# Patient Record
Sex: Female | Born: 1955 | Race: White | Hispanic: No | Marital: Married | Smoking: Former smoker
Health system: Southern US, Community
[De-identification: ages and names within clinical notes are randomized; demographics above are authoritative.]

## PROBLEM LIST (undated history)

## (undated) DIAGNOSIS — F4310 Post-traumatic stress disorder, unspecified: Secondary | ICD-10-CM

## (undated) DIAGNOSIS — T7840XA Allergy, unspecified, initial encounter: Secondary | ICD-10-CM

## (undated) DIAGNOSIS — M810 Age-related osteoporosis without current pathological fracture: Secondary | ICD-10-CM

## (undated) DIAGNOSIS — E785 Hyperlipidemia, unspecified: Secondary | ICD-10-CM

## (undated) DIAGNOSIS — M1990 Unspecified osteoarthritis, unspecified site: Secondary | ICD-10-CM

## (undated) DIAGNOSIS — G2581 Restless legs syndrome: Secondary | ICD-10-CM

## (undated) DIAGNOSIS — G8929 Other chronic pain: Secondary | ICD-10-CM

## (undated) DIAGNOSIS — I639 Cerebral infarction, unspecified: Secondary | ICD-10-CM

## (undated) DIAGNOSIS — R7303 Prediabetes: Secondary | ICD-10-CM

## (undated) DIAGNOSIS — R29898 Other symptoms and signs involving the musculoskeletal system: Secondary | ICD-10-CM

## (undated) DIAGNOSIS — R011 Cardiac murmur, unspecified: Secondary | ICD-10-CM

## (undated) DIAGNOSIS — R42 Dizziness and giddiness: Secondary | ICD-10-CM

## (undated) DIAGNOSIS — I10 Essential (primary) hypertension: Secondary | ICD-10-CM

## (undated) DIAGNOSIS — D351 Benign neoplasm of parathyroid gland: Secondary | ICD-10-CM

## (undated) DIAGNOSIS — J45909 Unspecified asthma, uncomplicated: Secondary | ICD-10-CM

## (undated) DIAGNOSIS — M549 Dorsalgia, unspecified: Secondary | ICD-10-CM

## (undated) DIAGNOSIS — Z8709 Personal history of other diseases of the respiratory system: Secondary | ICD-10-CM

## (undated) DIAGNOSIS — R2689 Other abnormalities of gait and mobility: Secondary | ICD-10-CM

## (undated) DIAGNOSIS — M792 Neuralgia and neuritis, unspecified: Secondary | ICD-10-CM

## (undated) DIAGNOSIS — K5909 Other constipation: Secondary | ICD-10-CM

## (undated) DIAGNOSIS — Z87440 Personal history of urinary (tract) infections: Secondary | ICD-10-CM

## (undated) DIAGNOSIS — IMO0001 Reserved for inherently not codable concepts without codable children: Secondary | ICD-10-CM

## (undated) DIAGNOSIS — H8109 Meniere's disease, unspecified ear: Secondary | ICD-10-CM

## (undated) DIAGNOSIS — F329 Major depressive disorder, single episode, unspecified: Secondary | ICD-10-CM

## (undated) DIAGNOSIS — M1711 Unilateral primary osteoarthritis, right knee: Secondary | ICD-10-CM

## (undated) DIAGNOSIS — J302 Other seasonal allergic rhinitis: Secondary | ICD-10-CM

## (undated) DIAGNOSIS — F32A Depression, unspecified: Secondary | ICD-10-CM

## (undated) DIAGNOSIS — E119 Type 2 diabetes mellitus without complications: Secondary | ICD-10-CM

## (undated) HISTORY — DX: Allergy, unspecified, initial encounter: T78.40XA

## (undated) HISTORY — DX: Depression, unspecified: F32.A

## (undated) HISTORY — PX: TUBAL LIGATION: SHX77

## (undated) HISTORY — DX: Restless legs syndrome: G25.81

## (undated) HISTORY — DX: Unspecified asthma, uncomplicated: J45.909

## (undated) HISTORY — DX: Major depressive disorder, single episode, unspecified: F32.9

## (undated) HISTORY — DX: Type 2 diabetes mellitus without complications: E11.9

## (undated) HISTORY — DX: Other chronic pain: G89.29

## (undated) HISTORY — DX: Other chronic pain: M54.9

## (undated) HISTORY — DX: Age-related osteoporosis without current pathological fracture: M81.0

## (undated) HISTORY — PX: BACK SURGERY: SHX140

## (undated) HISTORY — PX: TONSILLECTOMY: SUR1361

## (undated) HISTORY — PX: JOINT REPLACEMENT: SHX530

## (undated) HISTORY — PX: SPINE SURGERY: SHX786

## (undated) HISTORY — DX: Hyperlipidemia, unspecified: E78.5

## (undated) HISTORY — PX: SHOULDER ARTHROSCOPY W/ ROTATOR CUFF REPAIR: SHX2400

---

## 1997-10-09 HISTORY — PX: TUBAL LIGATION: SHX77

## 2005-07-14 ENCOUNTER — Ambulatory Visit: Payer: Self-pay | Admitting: Internal Medicine

## 2005-07-28 ENCOUNTER — Ambulatory Visit (HOSPITAL_COMMUNITY): Admission: RE | Admit: 2005-07-28 | Discharge: 2005-07-28 | Payer: Self-pay | Admitting: Internal Medicine

## 2005-08-04 ENCOUNTER — Ambulatory Visit: Payer: Self-pay | Admitting: Internal Medicine

## 2005-09-06 ENCOUNTER — Ambulatory Visit: Payer: Self-pay | Admitting: Internal Medicine

## 2005-09-28 ENCOUNTER — Ambulatory Visit: Payer: Self-pay | Admitting: Internal Medicine

## 2005-12-16 ENCOUNTER — Emergency Department (HOSPITAL_COMMUNITY): Admission: EM | Admit: 2005-12-16 | Discharge: 2005-12-16 | Payer: Self-pay | Admitting: Emergency Medicine

## 2006-06-08 ENCOUNTER — Ambulatory Visit: Payer: Self-pay | Admitting: Family Medicine

## 2006-06-26 ENCOUNTER — Ambulatory Visit: Payer: Self-pay | Admitting: Family Medicine

## 2006-07-12 ENCOUNTER — Other Ambulatory Visit: Admission: RE | Admit: 2006-07-12 | Discharge: 2006-07-12 | Payer: Self-pay | Admitting: Family Medicine

## 2006-07-12 ENCOUNTER — Ambulatory Visit: Payer: Self-pay | Admitting: Family Medicine

## 2006-07-17 ENCOUNTER — Encounter (INDEPENDENT_AMBULATORY_CARE_PROVIDER_SITE_OTHER): Payer: Self-pay | Admitting: Family Medicine

## 2006-07-17 ENCOUNTER — Ambulatory Visit (HOSPITAL_COMMUNITY): Admission: RE | Admit: 2006-07-17 | Discharge: 2006-07-17 | Payer: Self-pay | Admitting: Family Medicine

## 2006-07-24 ENCOUNTER — Ambulatory Visit: Payer: Self-pay | Admitting: Family Medicine

## 2006-08-08 ENCOUNTER — Ambulatory Visit (HOSPITAL_COMMUNITY): Admission: RE | Admit: 2006-08-08 | Discharge: 2006-08-08 | Payer: Self-pay | Admitting: Family Medicine

## 2006-08-10 ENCOUNTER — Encounter (INDEPENDENT_AMBULATORY_CARE_PROVIDER_SITE_OTHER): Payer: Self-pay | Admitting: Family Medicine

## 2006-08-14 ENCOUNTER — Encounter (INDEPENDENT_AMBULATORY_CARE_PROVIDER_SITE_OTHER): Payer: Self-pay | Admitting: Family Medicine

## 2006-08-21 ENCOUNTER — Ambulatory Visit: Payer: Self-pay | Admitting: Family Medicine

## 2006-09-05 DIAGNOSIS — M129 Arthropathy, unspecified: Secondary | ICD-10-CM | POA: Insufficient documentation

## 2006-09-05 DIAGNOSIS — R42 Dizziness and giddiness: Secondary | ICD-10-CM | POA: Insufficient documentation

## 2006-09-05 DIAGNOSIS — I10 Essential (primary) hypertension: Secondary | ICD-10-CM

## 2006-11-02 ENCOUNTER — Ambulatory Visit: Payer: Self-pay | Admitting: Family Medicine

## 2006-11-27 ENCOUNTER — Emergency Department (HOSPITAL_COMMUNITY): Admission: EM | Admit: 2006-11-27 | Discharge: 2006-11-27 | Payer: Self-pay | Admitting: Emergency Medicine

## 2006-11-30 ENCOUNTER — Ambulatory Visit: Payer: Self-pay | Admitting: Family Medicine

## 2006-11-30 DIAGNOSIS — J45909 Unspecified asthma, uncomplicated: Secondary | ICD-10-CM

## 2006-11-30 LAB — CONVERTED CEMR LAB: Inflenza A Ag: POSITIVE

## 2006-12-03 LAB — CONVERTED CEMR LAB
CO2: 23 meq/L (ref 19–32)
Chloride: 105 meq/L (ref 96–112)
Potassium: 4 meq/L (ref 3.5–5.3)
Sodium: 139 meq/L (ref 135–145)

## 2007-01-11 ENCOUNTER — Ambulatory Visit: Payer: Self-pay | Admitting: Family Medicine

## 2007-01-28 ENCOUNTER — Telehealth (INDEPENDENT_AMBULATORY_CARE_PROVIDER_SITE_OTHER): Payer: Self-pay | Admitting: Family Medicine

## 2007-01-30 ENCOUNTER — Ambulatory Visit: Payer: Self-pay | Admitting: Family Medicine

## 2007-01-31 ENCOUNTER — Telehealth (INDEPENDENT_AMBULATORY_CARE_PROVIDER_SITE_OTHER): Payer: Self-pay | Admitting: Family Medicine

## 2007-01-31 LAB — CONVERTED CEMR LAB
Eosinophils Absolute: 0.1 10*3/uL (ref 0.0–0.7)
HCT: 40.3 % (ref 36.0–46.0)
Hemoglobin: 13.3 g/dL (ref 12.0–15.0)
Lymphs Abs: 1.9 10*3/uL (ref 0.7–3.3)
MCV: 90.8 fL (ref 78.0–100.0)
Monocytes Relative: 5 % (ref 3–11)
Neutrophils Relative %: 59 % (ref 43–77)
RBC: 4.44 M/uL (ref 3.87–5.11)
WBC: 5.5 10*3/uL (ref 4.0–10.5)

## 2007-02-01 ENCOUNTER — Encounter (INDEPENDENT_AMBULATORY_CARE_PROVIDER_SITE_OTHER): Payer: Self-pay | Admitting: Family Medicine

## 2007-02-04 ENCOUNTER — Telehealth (INDEPENDENT_AMBULATORY_CARE_PROVIDER_SITE_OTHER): Payer: Self-pay | Admitting: Family Medicine

## 2007-02-05 ENCOUNTER — Ambulatory Visit: Payer: Self-pay | Admitting: Family Medicine

## 2007-02-13 ENCOUNTER — Ambulatory Visit (HOSPITAL_COMMUNITY): Admission: RE | Admit: 2007-02-13 | Discharge: 2007-02-13 | Payer: Self-pay | Admitting: Family Medicine

## 2007-02-26 ENCOUNTER — Ambulatory Visit: Payer: Self-pay | Admitting: Family Medicine

## 2007-02-26 LAB — CONVERTED CEMR LAB: OCCULT 3: NEGATIVE

## 2007-03-19 ENCOUNTER — Telehealth (INDEPENDENT_AMBULATORY_CARE_PROVIDER_SITE_OTHER): Payer: Self-pay | Admitting: Family Medicine

## 2007-03-19 ENCOUNTER — Encounter (INDEPENDENT_AMBULATORY_CARE_PROVIDER_SITE_OTHER): Payer: Self-pay | Admitting: Family Medicine

## 2007-04-18 ENCOUNTER — Ambulatory Visit: Payer: Self-pay | Admitting: Family Medicine

## 2007-04-18 LAB — CONVERTED CEMR LAB
Cholesterol, target level: 200 mg/dL
LDL Goal: 160 mg/dL

## 2007-04-20 ENCOUNTER — Encounter (INDEPENDENT_AMBULATORY_CARE_PROVIDER_SITE_OTHER): Payer: Self-pay | Admitting: Family Medicine

## 2007-04-21 LAB — CONVERTED CEMR LAB
BUN: 15 mg/dL (ref 6–23)
Creatinine, Ser: 0.77 mg/dL (ref 0.40–1.20)
Lymphocytes Relative: 31 % (ref 12–46)
Lymphs Abs: 2.2 10*3/uL (ref 0.7–3.3)
MCV: 93.2 fL (ref 78.0–100.0)
Monocytes Relative: 4 % (ref 3–11)
Neutro Abs: 4.7 10*3/uL (ref 1.7–7.7)
Neutrophils Relative %: 64 % (ref 43–77)
Potassium: 4.8 meq/L (ref 3.5–5.3)
RBC: 4.56 M/uL (ref 3.87–5.11)
WBC: 7.3 10*3/uL (ref 4.0–10.5)

## 2007-04-22 ENCOUNTER — Telehealth (INDEPENDENT_AMBULATORY_CARE_PROVIDER_SITE_OTHER): Payer: Self-pay | Admitting: *Deleted

## 2007-04-22 ENCOUNTER — Telehealth (INDEPENDENT_AMBULATORY_CARE_PROVIDER_SITE_OTHER): Payer: Self-pay | Admitting: Family Medicine

## 2007-07-24 ENCOUNTER — Ambulatory Visit (HOSPITAL_COMMUNITY): Admission: RE | Admit: 2007-07-24 | Discharge: 2007-07-24 | Payer: Self-pay | Admitting: Family Medicine

## 2007-07-25 ENCOUNTER — Telehealth (INDEPENDENT_AMBULATORY_CARE_PROVIDER_SITE_OTHER): Payer: Self-pay | Admitting: *Deleted

## 2007-07-25 ENCOUNTER — Encounter (INDEPENDENT_AMBULATORY_CARE_PROVIDER_SITE_OTHER): Payer: Self-pay | Admitting: Family Medicine

## 2007-10-17 ENCOUNTER — Ambulatory Visit: Payer: Self-pay | Admitting: Family Medicine

## 2007-10-17 DIAGNOSIS — G4700 Insomnia, unspecified: Secondary | ICD-10-CM | POA: Insufficient documentation

## 2007-10-22 ENCOUNTER — Encounter (INDEPENDENT_AMBULATORY_CARE_PROVIDER_SITE_OTHER): Payer: Self-pay | Admitting: Family Medicine

## 2007-10-22 ENCOUNTER — Encounter (INDEPENDENT_AMBULATORY_CARE_PROVIDER_SITE_OTHER): Payer: Self-pay | Admitting: *Deleted

## 2007-10-22 LAB — CONVERTED CEMR LAB
AST: 9 units/L
Albumin: 3.9 g/dL
Alkaline Phosphatase: 44 units/L
CO2: 25 meq/L
Cholesterol: 234 mg/dL
Creatinine, Ser: 0.78 mg/dL
Glucose, Bld: 85 mg/dL
LDL Cholesterol: 145 mg/dL
Sodium: 141 meq/L
Total Protein: 6.7 g/dL
Triglycerides: 173 mg/dL

## 2007-10-23 ENCOUNTER — Telehealth (INDEPENDENT_AMBULATORY_CARE_PROVIDER_SITE_OTHER): Payer: Self-pay | Admitting: *Deleted

## 2007-10-23 LAB — CONVERTED CEMR LAB
AST: 9 units/L (ref 0–37)
Alkaline Phosphatase: 44 units/L (ref 39–117)
BUN: 18 mg/dL (ref 6–23)
Basophils Relative: 0 % (ref 0–1)
Calcium: 9.6 mg/dL (ref 8.4–10.5)
Creatinine, Ser: 0.78 mg/dL (ref 0.40–1.20)
Eosinophils Absolute: 0.2 10*3/uL (ref 0.0–0.7)
Glucose, Bld: 85 mg/dL (ref 70–99)
HCT: 40.8 % (ref 36.0–46.0)
HDL: 54 mg/dL (ref >39)
Hemoglobin: 13.1 g/dL (ref 12.0–15.0)
MCHC: 32.1 g/dL (ref 30.0–36.0)
MCV: 90.9 fL (ref 78.0–100.0)
Monocytes Absolute: 0.4 10*3/uL (ref 0.1–1.0)
Monocytes Relative: 7 % (ref 3–12)
RBC: 4.49 M/uL (ref 3.87–5.11)
TSH: 1.35 microintl units/mL (ref 0.350–5.50)
Total CHOL/HDL Ratio: 4.3
Triglycerides: 173 mg/dL — ABNORMAL HIGH (ref <150)

## 2007-11-18 ENCOUNTER — Ambulatory Visit: Payer: Self-pay | Admitting: Family Medicine

## 2007-11-18 DIAGNOSIS — E785 Hyperlipidemia, unspecified: Secondary | ICD-10-CM

## 2007-11-18 DIAGNOSIS — E782 Mixed hyperlipidemia: Secondary | ICD-10-CM | POA: Insufficient documentation

## 2007-12-03 ENCOUNTER — Encounter (INDEPENDENT_AMBULATORY_CARE_PROVIDER_SITE_OTHER): Payer: Self-pay | Admitting: Family Medicine

## 2007-12-04 ENCOUNTER — Encounter (INDEPENDENT_AMBULATORY_CARE_PROVIDER_SITE_OTHER): Payer: Self-pay | Admitting: Family Medicine

## 2007-12-16 ENCOUNTER — Ambulatory Visit: Payer: Self-pay | Admitting: Family Medicine

## 2008-01-13 ENCOUNTER — Ambulatory Visit: Payer: Self-pay | Admitting: Family Medicine

## 2008-01-13 LAB — CONVERTED CEMR LAB
Blood in Urine, dipstick: NEGATIVE
Ketones, urine, test strip: NEGATIVE
Protein, U semiquant: NEGATIVE
Specific Gravity, Urine: 1.015
pH: 7

## 2008-01-24 ENCOUNTER — Telehealth (INDEPENDENT_AMBULATORY_CARE_PROVIDER_SITE_OTHER): Payer: Self-pay | Admitting: Family Medicine

## 2008-02-10 ENCOUNTER — Ambulatory Visit: Payer: Self-pay | Admitting: Family Medicine

## 2008-02-17 ENCOUNTER — Ambulatory Visit: Payer: Self-pay | Admitting: Family Medicine

## 2008-03-23 ENCOUNTER — Ambulatory Visit: Payer: Self-pay | Admitting: Family Medicine

## 2008-03-23 DIAGNOSIS — M1990 Unspecified osteoarthritis, unspecified site: Secondary | ICD-10-CM | POA: Insufficient documentation

## 2008-03-24 ENCOUNTER — Encounter (INDEPENDENT_AMBULATORY_CARE_PROVIDER_SITE_OTHER): Payer: Self-pay | Admitting: Family Medicine

## 2008-03-25 ENCOUNTER — Encounter (INDEPENDENT_AMBULATORY_CARE_PROVIDER_SITE_OTHER): Payer: Self-pay | Admitting: Family Medicine

## 2008-04-27 ENCOUNTER — Ambulatory Visit: Payer: Self-pay | Admitting: Family Medicine

## 2008-04-30 ENCOUNTER — Encounter: Payer: Self-pay | Admitting: Orthopedic Surgery

## 2008-05-05 ENCOUNTER — Ambulatory Visit: Payer: Self-pay | Admitting: Orthopedic Surgery

## 2008-05-18 ENCOUNTER — Encounter (INDEPENDENT_AMBULATORY_CARE_PROVIDER_SITE_OTHER): Payer: Self-pay | Admitting: Family Medicine

## 2008-05-25 ENCOUNTER — Ambulatory Visit: Payer: Self-pay | Admitting: Family Medicine

## 2008-05-25 LAB — CONVERTED CEMR LAB: LDL Goal: 130 mg/dL

## 2008-06-09 ENCOUNTER — Encounter (INDEPENDENT_AMBULATORY_CARE_PROVIDER_SITE_OTHER): Payer: Self-pay | Admitting: Family Medicine

## 2008-10-09 HISTORY — PX: SHOULDER ARTHROSCOPY W/ ROTATOR CUFF REPAIR: SHX2400

## 2008-10-21 ENCOUNTER — Ambulatory Visit: Payer: Self-pay | Admitting: Family Medicine

## 2008-10-23 ENCOUNTER — Encounter (INDEPENDENT_AMBULATORY_CARE_PROVIDER_SITE_OTHER): Payer: Self-pay | Admitting: Family Medicine

## 2008-11-19 ENCOUNTER — Ambulatory Visit: Payer: Self-pay | Admitting: Family Medicine

## 2008-12-02 ENCOUNTER — Encounter: Payer: Self-pay | Admitting: Orthopedic Surgery

## 2008-12-02 ENCOUNTER — Encounter (INDEPENDENT_AMBULATORY_CARE_PROVIDER_SITE_OTHER): Payer: Self-pay | Admitting: Family Medicine

## 2008-12-03 ENCOUNTER — Encounter (INDEPENDENT_AMBULATORY_CARE_PROVIDER_SITE_OTHER): Payer: Self-pay | Admitting: Family Medicine

## 2008-12-03 ENCOUNTER — Encounter (INDEPENDENT_AMBULATORY_CARE_PROVIDER_SITE_OTHER): Payer: Self-pay | Admitting: *Deleted

## 2008-12-03 LAB — CONVERTED CEMR LAB
ALT: 8 units/L
Alkaline Phosphatase: 84 units/L
BUN: 14 mg/dL
Cholesterol: 239 mg/dL
Creatinine, Ser: 0.77 mg/dL
HDL: 48 mg/dL
LDL Cholesterol: 163 mg/dL
Platelets: 308 10*3/uL
Total Protein: 6.9 g/dL

## 2008-12-09 LAB — CONVERTED CEMR LAB
Alkaline Phosphatase: 84 units/L (ref 39–117)
BUN: 14 mg/dL (ref 6–23)
CO2: 23 meq/L (ref 19–32)
Cholesterol: 239 mg/dL — ABNORMAL HIGH (ref 0–200)
Creatinine, Ser: 0.77 mg/dL (ref 0.40–1.20)
Eosinophils Absolute: 0.2 10*3/uL (ref 0.0–0.7)
Eosinophils Relative: 4 % (ref 0–5)
Glucose, Bld: 83 mg/dL (ref 70–99)
HCT: 39.3 % (ref 36.0–46.0)
HDL: 48 mg/dL (ref >39)
Hemoglobin: 13 g/dL (ref 12.0–15.0)
LDL Cholesterol: 163 mg/dL — ABNORMAL HIGH (ref 0–99)
Lymphocytes Relative: 37 % (ref 12–46)
Lymphs Abs: 2.2 10*3/uL (ref 0.7–4.0)
MCV: 89.5 fL (ref 78.0–100.0)
Monocytes Absolute: 0.4 10*3/uL (ref 0.1–1.0)
Monocytes Relative: 6 % (ref 3–12)
Platelets: 308 10*3/uL (ref 150–400)
RBC: 4.39 M/uL (ref 3.87–5.11)
Total Bilirubin: 0.4 mg/dL (ref 0.3–1.2)
Triglycerides: 138 mg/dL (ref <150)
VLDL: 28 mg/dL (ref 0–40)
WBC: 6 10*3/uL (ref 4.0–10.5)

## 2008-12-14 ENCOUNTER — Ambulatory Visit: Payer: Self-pay | Admitting: Family Medicine

## 2009-01-22 ENCOUNTER — Telehealth (INDEPENDENT_AMBULATORY_CARE_PROVIDER_SITE_OTHER): Payer: Self-pay | Admitting: *Deleted

## 2009-03-16 ENCOUNTER — Ambulatory Visit: Payer: Self-pay | Admitting: Family Medicine

## 2009-03-16 LAB — CONVERTED CEMR LAB: LDL Goal: 160 mg/dL

## 2009-03-23 ENCOUNTER — Emergency Department (HOSPITAL_COMMUNITY): Admission: EM | Admit: 2009-03-23 | Discharge: 2009-03-23 | Payer: Self-pay | Admitting: Emergency Medicine

## 2009-03-25 ENCOUNTER — Ambulatory Visit: Payer: Self-pay | Admitting: Family Medicine

## 2009-03-25 DIAGNOSIS — R7309 Other abnormal glucose: Secondary | ICD-10-CM | POA: Insufficient documentation

## 2009-03-30 ENCOUNTER — Encounter (INDEPENDENT_AMBULATORY_CARE_PROVIDER_SITE_OTHER): Payer: Self-pay | Admitting: Family Medicine

## 2009-03-30 ENCOUNTER — Ambulatory Visit (HOSPITAL_COMMUNITY): Admission: RE | Admit: 2009-03-30 | Discharge: 2009-03-30 | Payer: Self-pay | Admitting: Family Medicine

## 2009-03-30 ENCOUNTER — Ambulatory Visit: Payer: Self-pay | Admitting: Cardiology

## 2009-04-08 ENCOUNTER — Ambulatory Visit: Payer: Self-pay | Admitting: Family Medicine

## 2009-04-08 DIAGNOSIS — R600 Localized edema: Secondary | ICD-10-CM | POA: Insufficient documentation

## 2009-04-08 DIAGNOSIS — R609 Edema, unspecified: Secondary | ICD-10-CM | POA: Insufficient documentation

## 2009-05-04 ENCOUNTER — Ambulatory Visit: Payer: Self-pay | Admitting: Orthopedic Surgery

## 2009-05-06 ENCOUNTER — Telehealth (INDEPENDENT_AMBULATORY_CARE_PROVIDER_SITE_OTHER): Payer: Self-pay | Admitting: *Deleted

## 2009-05-06 ENCOUNTER — Encounter (INDEPENDENT_AMBULATORY_CARE_PROVIDER_SITE_OTHER): Payer: Self-pay | Admitting: *Deleted

## 2009-05-06 ENCOUNTER — Ambulatory Visit: Payer: Self-pay | Admitting: Family Medicine

## 2009-05-06 LAB — CONVERTED CEMR LAB: OCCULT 1: NEGATIVE

## 2009-05-10 ENCOUNTER — Encounter (INDEPENDENT_AMBULATORY_CARE_PROVIDER_SITE_OTHER): Payer: Self-pay | Admitting: Family Medicine

## 2009-05-11 ENCOUNTER — Encounter (INDEPENDENT_AMBULATORY_CARE_PROVIDER_SITE_OTHER): Payer: Self-pay | Admitting: Family Medicine

## 2009-05-12 ENCOUNTER — Encounter: Payer: Self-pay | Admitting: Orthopedic Surgery

## 2009-05-25 ENCOUNTER — Ambulatory Visit: Payer: Self-pay | Admitting: Orthopedic Surgery

## 2009-06-07 ENCOUNTER — Encounter (INDEPENDENT_AMBULATORY_CARE_PROVIDER_SITE_OTHER): Payer: Self-pay | Admitting: Family Medicine

## 2009-06-08 ENCOUNTER — Ambulatory Visit: Payer: Self-pay | Admitting: Family Medicine

## 2009-06-18 ENCOUNTER — Encounter (INDEPENDENT_AMBULATORY_CARE_PROVIDER_SITE_OTHER): Payer: Self-pay | Admitting: Family Medicine

## 2009-06-23 ENCOUNTER — Ambulatory Visit (HOSPITAL_COMMUNITY): Admission: RE | Admit: 2009-06-23 | Discharge: 2009-06-23 | Payer: Self-pay | Admitting: Family Medicine

## 2009-08-31 ENCOUNTER — Ambulatory Visit: Payer: Self-pay | Admitting: Orthopedic Surgery

## 2009-09-01 ENCOUNTER — Encounter (INDEPENDENT_AMBULATORY_CARE_PROVIDER_SITE_OTHER): Payer: Self-pay | Admitting: *Deleted

## 2009-09-06 ENCOUNTER — Ambulatory Visit (HOSPITAL_COMMUNITY): Admission: RE | Admit: 2009-09-06 | Discharge: 2009-09-06 | Payer: Self-pay | Admitting: Orthopedic Surgery

## 2009-09-13 ENCOUNTER — Ambulatory Visit: Payer: Self-pay | Admitting: Orthopedic Surgery

## 2009-09-20 ENCOUNTER — Encounter: Payer: Self-pay | Admitting: Orthopedic Surgery

## 2009-09-20 ENCOUNTER — Encounter (HOSPITAL_COMMUNITY): Admission: RE | Admit: 2009-09-20 | Discharge: 2009-10-08 | Payer: Self-pay | Admitting: Orthopedic Surgery

## 2009-10-11 ENCOUNTER — Encounter (HOSPITAL_COMMUNITY): Admission: RE | Admit: 2009-10-11 | Discharge: 2009-11-10 | Payer: Self-pay | Admitting: Orthopedic Surgery

## 2009-10-18 ENCOUNTER — Telehealth: Payer: Self-pay | Admitting: Orthopedic Surgery

## 2009-10-27 ENCOUNTER — Ambulatory Visit: Payer: Self-pay | Admitting: Orthopedic Surgery

## 2009-10-27 ENCOUNTER — Telehealth: Payer: Self-pay | Admitting: Orthopedic Surgery

## 2009-10-29 ENCOUNTER — Ambulatory Visit: Payer: Self-pay | Admitting: Orthopedic Surgery

## 2009-10-29 ENCOUNTER — Ambulatory Visit (HOSPITAL_COMMUNITY): Admission: RE | Admit: 2009-10-29 | Discharge: 2009-10-29 | Payer: Self-pay | Admitting: Orthopedic Surgery

## 2009-11-02 ENCOUNTER — Ambulatory Visit: Payer: Self-pay | Admitting: Orthopedic Surgery

## 2009-11-04 ENCOUNTER — Encounter: Payer: Self-pay | Admitting: Orthopedic Surgery

## 2009-11-08 ENCOUNTER — Emergency Department (HOSPITAL_COMMUNITY): Admission: EM | Admit: 2009-11-08 | Discharge: 2009-11-09 | Payer: Self-pay | Admitting: Emergency Medicine

## 2009-11-12 ENCOUNTER — Encounter (HOSPITAL_COMMUNITY): Admission: RE | Admit: 2009-11-12 | Discharge: 2009-12-12 | Payer: Self-pay | Admitting: Orthopedic Surgery

## 2009-11-29 ENCOUNTER — Ambulatory Visit: Payer: Self-pay | Admitting: Family Medicine

## 2009-11-29 DIAGNOSIS — R5381 Other malaise: Secondary | ICD-10-CM | POA: Insufficient documentation

## 2009-11-29 DIAGNOSIS — R5383 Other fatigue: Secondary | ICD-10-CM

## 2009-12-03 ENCOUNTER — Encounter: Payer: Self-pay | Admitting: Orthopedic Surgery

## 2009-12-06 ENCOUNTER — Ambulatory Visit: Payer: Self-pay | Admitting: Orthopedic Surgery

## 2009-12-13 ENCOUNTER — Encounter (HOSPITAL_COMMUNITY): Admission: RE | Admit: 2009-12-13 | Discharge: 2010-01-12 | Payer: Self-pay | Admitting: Orthopedic Surgery

## 2009-12-15 ENCOUNTER — Encounter: Payer: Self-pay | Admitting: Family Medicine

## 2009-12-23 ENCOUNTER — Encounter (INDEPENDENT_AMBULATORY_CARE_PROVIDER_SITE_OTHER): Payer: Self-pay | Admitting: *Deleted

## 2009-12-28 ENCOUNTER — Encounter (INDEPENDENT_AMBULATORY_CARE_PROVIDER_SITE_OTHER): Payer: Self-pay | Admitting: *Deleted

## 2009-12-28 ENCOUNTER — Ambulatory Visit: Payer: Self-pay | Admitting: Cardiology

## 2009-12-31 ENCOUNTER — Encounter: Payer: Self-pay | Admitting: Orthopedic Surgery

## 2010-01-03 ENCOUNTER — Ambulatory Visit: Payer: Self-pay | Admitting: Orthopedic Surgery

## 2010-01-17 ENCOUNTER — Ambulatory Visit: Payer: Self-pay | Admitting: Family Medicine

## 2010-01-17 DIAGNOSIS — H9319 Tinnitus, unspecified ear: Secondary | ICD-10-CM | POA: Insufficient documentation

## 2010-01-17 DIAGNOSIS — H9311 Tinnitus, right ear: Secondary | ICD-10-CM | POA: Insufficient documentation

## 2010-02-01 ENCOUNTER — Encounter (INDEPENDENT_AMBULATORY_CARE_PROVIDER_SITE_OTHER): Payer: Self-pay | Admitting: *Deleted

## 2010-02-23 ENCOUNTER — Telehealth: Payer: Self-pay | Admitting: Family Medicine

## 2010-02-23 ENCOUNTER — Telehealth: Payer: Self-pay | Admitting: Orthopedic Surgery

## 2010-03-17 ENCOUNTER — Encounter (INDEPENDENT_AMBULATORY_CARE_PROVIDER_SITE_OTHER): Payer: Self-pay | Admitting: *Deleted

## 2010-03-20 ENCOUNTER — Emergency Department (HOSPITAL_COMMUNITY): Admission: EM | Admit: 2010-03-20 | Discharge: 2010-03-20 | Payer: Self-pay | Admitting: Emergency Medicine

## 2010-03-30 ENCOUNTER — Other Ambulatory Visit: Admission: RE | Admit: 2010-03-30 | Discharge: 2010-03-30 | Payer: Self-pay | Admitting: Family Medicine

## 2010-03-30 ENCOUNTER — Ambulatory Visit: Payer: Self-pay | Admitting: Family Medicine

## 2010-04-01 ENCOUNTER — Encounter: Payer: Self-pay | Admitting: Family Medicine

## 2010-04-01 LAB — CONVERTED CEMR LAB: Pap Smear: NEGATIVE

## 2010-04-04 ENCOUNTER — Ambulatory Visit: Payer: Self-pay | Admitting: Orthopedic Surgery

## 2010-04-04 DIAGNOSIS — F329 Major depressive disorder, single episode, unspecified: Secondary | ICD-10-CM

## 2010-04-04 DIAGNOSIS — F339 Major depressive disorder, recurrent, unspecified: Secondary | ICD-10-CM | POA: Insufficient documentation

## 2010-05-11 ENCOUNTER — Ambulatory Visit: Payer: Self-pay | Admitting: Physician Assistant

## 2010-05-11 DIAGNOSIS — M5430 Sciatica, unspecified side: Secondary | ICD-10-CM | POA: Insufficient documentation

## 2010-05-19 ENCOUNTER — Telehealth: Payer: Self-pay | Admitting: Family Medicine

## 2010-05-25 ENCOUNTER — Encounter: Payer: Self-pay | Admitting: Family Medicine

## 2010-05-25 ENCOUNTER — Encounter (HOSPITAL_COMMUNITY): Admission: RE | Admit: 2010-05-25 | Discharge: 2010-06-24 | Payer: Self-pay | Admitting: Family Medicine

## 2010-06-28 ENCOUNTER — Ambulatory Visit: Payer: Self-pay | Admitting: Family Medicine

## 2010-06-28 DIAGNOSIS — M5416 Radiculopathy, lumbar region: Secondary | ICD-10-CM | POA: Insufficient documentation

## 2010-06-28 DIAGNOSIS — IMO0002 Reserved for concepts with insufficient information to code with codable children: Secondary | ICD-10-CM

## 2010-06-29 ENCOUNTER — Encounter: Payer: Self-pay | Admitting: Family Medicine

## 2010-08-08 ENCOUNTER — Emergency Department (HOSPITAL_COMMUNITY): Admission: EM | Admit: 2010-08-08 | Discharge: 2010-08-08 | Payer: Self-pay | Admitting: Emergency Medicine

## 2010-08-09 ENCOUNTER — Encounter: Payer: Self-pay | Admitting: Family Medicine

## 2010-08-18 ENCOUNTER — Ambulatory Visit (HOSPITAL_COMMUNITY)
Admission: RE | Admit: 2010-08-18 | Discharge: 2010-08-18 | Payer: Self-pay | Admitting: Physical Medicine and Rehabilitation

## 2010-08-24 ENCOUNTER — Encounter: Payer: Self-pay | Admitting: Family Medicine

## 2010-08-30 ENCOUNTER — Encounter: Payer: Self-pay | Admitting: Family Medicine

## 2010-10-06 ENCOUNTER — Telehealth: Payer: Self-pay | Admitting: Family Medicine

## 2010-10-09 HISTORY — PX: BACK SURGERY: SHX140

## 2010-10-27 ENCOUNTER — Telehealth: Payer: Self-pay | Admitting: Family Medicine

## 2010-10-27 ENCOUNTER — Encounter: Payer: Self-pay | Admitting: Family Medicine

## 2010-10-31 ENCOUNTER — Ambulatory Visit
Admission: RE | Admit: 2010-10-31 | Discharge: 2010-10-31 | Payer: Self-pay | Source: Home / Self Care | Attending: Family Medicine | Admitting: Family Medicine

## 2010-11-02 ENCOUNTER — Encounter: Payer: Self-pay | Admitting: Family Medicine

## 2010-11-08 NOTE — Progress Notes (Signed)
Summary: DR. Kellie Simmering  DR. TRUSLOW   Imported By: Lind Guest 12/22/2009 11:22:50  _____________________________________________________________________  External Attachment:    Type:   Image     Comment:   External Document

## 2010-11-08 NOTE — Assessment & Plan Note (Signed)
Summary: leg pain- room 1   Vital Signs:  Patient profile:   55 year old female Menstrual status:  postmenopausal Height:      62.5 inches Weight:      162 pounds BMI:     29.26 O2 Sat:      98 % on Room air Pulse rate:   86 / minute Resp:     16 per minute BP sitting:   140 / 84  (left arm)  Vitals Entered By: Adella Hare LPN (May 11, 2010 9:43 AM) CC: left upper leg pain, radiates down leg Is Patient Diabetic? No Pain Assessment Patient in pain? yes     Location: left leg Intensity: 3 Type: tightness/cramp Onset of pain  constant/ varies in intensity Comments did not bring meds to ov   Primary Jedrek Dinovo:  Syliva Overman MD  CC:  left upper leg pain and radiates down leg.  History of Present Illness: Pt presents today with c/o Lt LE pain x 4-5 days.  This starts in her Lt buttock and does down to her calf.  Worsens with prolonged sitting or standing.  Has woke her up at bedime.  No trauma. No low back pain.  Is noticing now some numbness and tingling in her toes too.  No loss of bowel or blader control.  Hx of Rt sciatica 22 yrs ago during pregnancy.  She was taking 1 neurontin at bedtime and has increased it to 2 at bedtime the last couple of days, but not noticing any improvement.  Pt has fibromyalgia and OA, but this pain is different.  She takes 400mg  Ibuprofen two times a day.  States the prescription is for 800mg  but this bothers her stomach so she cuts them in half.  Pt states she has a no effect from pain meds.  Vicoden, Percocet, Oxycodone, all have tried and none help with pain in the past.    Allergies (verified): No Known Drug Allergies PMH reviewed for relevance  Review of Systems General:  Denies chills and fever. CV:  Denies chest pain or discomfort. Resp:  Denies shortness of breath. GI:  Denies abdominal pain and change in bowel habits. GU:  Denies dysuria, incontinence, and urinary frequency. MS:  Complains of muscle aches; denies joint pain,  joint swelling, low back pain, and mid back pain. Neuro:  Complains of numbness and tingling.  Physical Exam  General:  Well-developed,well-nourished,in no acute distress; alert,appropriate and cooperative throughout examination Head:  Normocephalic and atraumatic without obvious abnormalities. No apparent alopecia or balding. Lungs:  Normal respiratory effort, chest expands symmetrically. Lungs are clear to auscultation, no crackles or wheezes. Heart:  Normal rate and regular rhythm. S1 and S2 normal without gallop, murmur, click, rub or other extra sounds. Msk:  LS spine:  FROM.  TTP Lt sciatic notch.  Spinous processes, paraspinal muscles and SI joints nontender.  Neg SLR. Pulses:  R posterior tibial normal, R dorsalis pedis normal, L posterior tibial normal, and L dorsalis pedis normal.   Extremities:  No clubbing, cyanosis, edema, or deformity noted with normal full range of motion of all joints.   Neurologic:  alert & oriented X3, strength normal in all extremities, and gait normal.  Bilat patellar and achilles DTR +1/4. Cervical Nodes:  No lymphadenopathy noted Psych:  Cognition and judgment appear intact. Alert and cooperative with normal attention span and concentration. No apparent delusions, illusions, hallucinations   Impression & Recommendations:  Problem # 1:  SCIATICA, LEFT (ICD-724.3) Assessment New  Her updated medication list for this problem includes:    Celebrex 200 Mg Caps (Celecoxib) ..... One daily    Ibuprofen 800 Mg Tabs (Ibuprofen) .Marland Kitchen... Take at bedtime  Problem # 2:  HYPERTENSION (ICD-401.9) Assessment: Comment Only  Her updated medication list for this problem includes:    Lisinopril 20 Mg Tabs (Lisinopril) ..... One daily  BP today: 140/84 Prior BP: 112/80 (03/30/2010)  Prior 10 Yr Risk Heart Disease: 7 % (06/08/2009)  Labs Reviewed: K+: 4.6 (12/03/2008) Creat: : 0.77 (12/03/2008)   Chol: 239 (12/03/2008)   HDL: 48 (12/03/2008)   LDL: 163  (12/03/2008)   TG: 138 (12/03/2008)  Complete Medication List: 1)  Lisinopril 20 Mg Tabs (Lisinopril) .... One daily 2)  Proair Hfa 108 (90 Base) Mcg/act Aers (Albuterol sulfate) .... 2 puffs every 6 hours as needed 3)  Symbicort 160-4.5 Mcg/act Aero (Budesonide-formoterol fumarate) .... One puff two times a day 4)  Minocycline Hcl 100 Mg Caps (Minocycline hcl) .... One once daily 5)  Mirapex 0.25 Mg Tabs (Pramipexole dihydrochloride) .... At bedtime 6)  Left Wrist Carpal Tunnel Brace  .... As directed 7)  Celebrex 200 Mg Caps (Celecoxib) .... One daily 8)  Neurontin 100 Mg Caps (Gabapentin) .Marland Kitchen.. 1 by mouth at bedtime 9)  Zyrtec Allergy 10 Mg Tabs (Cetirizine hcl) .... Take 1 tablet by mouth once a day 10)  Ibuprofen 800 Mg Tabs (Ibuprofen) .... Take at bedtime 11)  Fluticasone Propionate 50 Mcg/act Susp (Fluticasone propionate) .... Use 2 sprays each nostril once daily 12)  Lexapro 20 Mg Tabs (Escitalopram oxalate) .... Take 1 tablet by mouth once a day  Other Orders: Depo- Medrol 80mg  (J1040) Ketorolac-Toradol 15mg  (Z6109) Admin of Therapeutic Inj  intramuscular or subcutaneous (60454)  Patient Instructions: 1)  Keep your next appt with Dr Lodema Hong.  We will see you sooner if needed. 2)  Increase your Ibuprofen dose to 400 mg 4 times daily. 3)  Increase your Neurontin to 100 mg am and 200mg  at bedtime. 4)  You may use Acetominophen every 4 hrs as needed for pain. 5)  You have received Depo Medrol today and Toradol to help with pain and inflammation. 6)  If you are not doing better Monday call the office.  I will refer you to physical therapy.   Medication Administration  Injection # 1:    Medication: Depo- Medrol 80mg     Diagnosis: LEG PAIN, LEFT (ICD-729.5)    Route: IM    Site: RUOQ gluteus    Exp Date: 4/12    Lot #: [id number]    Mfr: Pharmacia    Patient tolerated injection without complications    Given by: Adella Hare LPN (May 11, 2010 11:34 AM)  Injection # 2:     Medication: Ketorolac-Toradol 15mg     Diagnosis: LEG PAIN, LEFT (ICD-729.5)    Route: IM    Site: LUOQ gluteus    Exp Date: 12/08/2011    Lot #: [id number]    Mfr: [email or web address]    Comments: toradol 60mg  given    Patient tolerated injection without complications    Given by: Adella Hare LPN (May 11, 2010 11:35 AM)  Orders Added: 1)  Depo- Medrol 80mg  [J1040] 2)  Ketorolac-Toradol 15mg  [J1885] 3)  Admin of Therapeutic Inj  intramuscular or subcutaneous [96372] 4)  Est. Patient Level IV [47829]

## 2010-11-08 NOTE — Letter (Signed)
Summary: Letter  Letter   Imported By: Lind Guest 04/04/2010 11:32:24  _____________________________________________________________________  External Attachment:    Type:   Image     Comment:   External Document

## 2010-11-08 NOTE — Miscellaneous (Signed)
Summary: labs cmp,lipid,10/22/2007  Clinical Lists Changes  Observations: Added new observation of CALCIUM: 9.6 mg/dL (54/06/8118 14:78) Added new observation of ALBUMIN: 3.9 g/dL (29/56/2130 86:57) Added new observation of PROTEIN, TOT: 6.7 g/dL (84/69/6295 28:41) Added new observation of SGPT (ALT): 10 units/L (10/22/2007 11:16) Added new observation of SGOT (AST): 9 units/L (10/22/2007 11:16) Added new observation of ALK PHOS: 44 units/L (10/22/2007 11:16) Added new observation of CREATININE: 0.78 mg/dL (32/44/0102 72:53) Added new observation of BUN: 18 mg/dL (66/44/0347 42:59) Added new observation of BG RANDOM: 85 mg/dL (56/38/7564 33:29) Added new observation of CO2 PLSM/SER: 25 meq/L (10/22/2007 11:16) Added new observation of CL SERUM: 106 meq/L (10/22/2007 11:16) Added new observation of K SERUM: 4.6 meq/L (10/22/2007 11:16) Added new observation of NA: 141 meq/L (10/22/2007 11:16) Added new observation of LDL: 145 mg/dL (51/88/4166 06:30) Added new observation of HDL: 54 mg/dL (16/10/930 35:57) Added new observation of TRIGLYC TOT: 173 mg/dL (32/20/2542 70:62) Added new observation of CHOLESTEROL: 234 mg/dL (37/62/8315 17:61)

## 2010-11-08 NOTE — Miscellaneous (Signed)
Summary: OT  Clinical evaluation  OT  Clinical evaluation   Imported By: Jacklynn Ganong 11/16/2009 15:51:18  _____________________________________________________________________  External Attachment:    Type:   Image     Comment:   External Document

## 2010-11-08 NOTE — Letter (Signed)
Summary: no show/ rehab department  no show/ rehab department   Imported By: Lind Guest 06/29/2010 11:36:39  _____________________________________________________________________  External Attachment:    Type:   Image     Comment:   External Document

## 2010-11-08 NOTE — Progress Notes (Signed)
Summary: No pre-cert required for out-patient procedure  Phone Note Outgoing Call   Call placed to: Insurer Summary of Call: [employer] re: out-patient surgery scheduled 10/29/09; for cpt 29826, no pre-cert required, per Tyra W. Initial call taken by: Cammie Sickle,  October 27, 2009 5:08 PM

## 2010-11-08 NOTE — Assessment & Plan Note (Signed)
Summary: 3 MO RECK/XR RT SHULDER/POST OP/BSBC/BSF   Visit Type:  Follow-up Primary Provider:  Syliva Overman MD  CC:  right shoulder .  History of Present Illness: 55 year old female 5 months status post arthroscopy RIGHT shoulder with subacromial decompression  The patient states her shoulder is doing well.  She is currently on ibuprofen 800 mg and Neurontin 300 mg, the Neurontin is for fibromyalgia  date of surgery October 30, 1999  Allergies: No Known Drug Allergies  Physical Exam  Additional Exam:  the patient looks well  She has good pulse in her RIGHT upper extremity no lymphadenopathy normal skin, sensation is normal she is awake and alert  Her shoulder looks good there is no tenderness or swelling her range of motion is excellent strength is excellent as well compared to the LEFT.  Shoulder is stable     Impression & Recommendations:  Problem # 1:  IMPINGEMENT SYNDROME (ICD-726.2) Assessment Improved  Orders: Est. Patient Level II ([address])  Patient Instructions: 1)  Please schedule a follow-up appointment as needed.

## 2010-11-08 NOTE — Letter (Signed)
Summary: Stress Echocardiogram Information Sheet  [facility]  [address], [place] [address]   Phone: [phone]  Fax: [phone]      December 28, 2009 MRN: [id number] light prior to the test.   Teresa Soto  Doctor: Appointment Date: Appointment Time: Appointment Location: [facility]  Stress Echocardiogram Information Sheet    Instructions:   1.    TAKE ALL YOUR MEDS WITH A SIP OF WATER THE MORNING OF YOUR TEST 2. NOTHING TO EAT OR DRINK PRIOR TO YOUR TEST.  3. Dress prepared to exercise.  4. DO NOT use ANY caffine or tobacco products 3 hours before appointment.  5. Report to the Short Stay Center on the1st floor.  6. Please bring all current prescription medications.  7. If you have any questions, please call [phone]

## 2010-11-08 NOTE — Assessment & Plan Note (Signed)
Summary: ears humming- room 2   Vital Signs:  Patient profile:   55 year old female Menstrual status:  postmenopausal Height:      62.5 inches Weight:      161.25 pounds BMI:     29.13 O2 Sat:      98 % on Room air Pulse rate:   86 / minute Resp:     16 per minute BP sitting:   100 / 70  (left arm)  Vitals Entered By: Adella Hare LPN (January 17, 2010 2:29 PM) CC: ears humming, causes dizziness at times Is Patient Diabetic? No Pain Assessment Patient in pain? no        Primary Provider:  Syliva Overman MD  CC:  ears humming and causes dizziness at times.  History of Present Illness: Pt is here today with humming in her Rt ear x 3 days.  States yesterday when swallowed her Rt ear would click, then the humming would go away for awhile.  Not happening when swallows today.  Hx of vertigo.  States she is having some problems with dizziness off & on.  No headache, or weakness.  Hx of allergic rhinitis.  Uses Zyrtec daily.  No increase in congestion, sneezing, etc.  Usually the fall is her worse season, though she has nasal congestion yr round.   Current Medications (verified): 1)  Lisinopril 20 Mg  Tabs (Lisinopril) .... One Daily 2)  Proair Hfa 108 (90 Base) Mcg/act  Aers (Albuterol Sulfate) .... 2 Puffs Every 6 Hours As Needed 3)  Symbicort 160-4.5 Mcg/act  Aero (Budesonide-Formoterol Fumarate) .... One Puff Two Times A Day 4)  Minocycline Hcl 100 Mg  Caps (Minocycline Hcl) .... One Once Daily 5)  Mirapex 0.25 Mg Tabs (Pramipexole Dihydrochloride) .... At Bedtime 6)  Left Wrist Carpal Tunnel Brace .... As Directed 7)  Celebrex 200 Mg Caps (Celecoxib) .... One Daily 8)  Lexapro 10 Mg Tabs (Escitalopram Oxalate) .... One Daily 9)  Neurontin 100 Mg Caps (Gabapentin) .Marland Kitchen.. 1 By Mouth At Bedtime 10)  Zyrtec Allergy 10 Mg Tabs (Cetirizine Hcl) .... Take 1 Tablet By Mouth Once A Day 11)  Ibuprofen 800 Mg Tabs (Ibuprofen) .... Take At Bedtime  Allergies (verified): No Known Drug  Allergies  Past History:  Past medical history reviewed for relevance to current acute and chronic problems.  Past Medical History: Reviewed history from 12/28/2009 and no changes required. 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 ENT:  Complains of nasal congestion and ringing in ears; denies ear discharge, earache, postnasal drainage, sinus pressure, and sore throat; RT EAR HUMMING X 3 DAYS. CV:  Denies chest pain or discomfort. Resp:  Denies cough and shortness of breath. Neuro:  Denies headaches, poor balance, tremors, and weakness.  Physical Exam  General:  Well-developed,well-nourished,in no acute distress; alert,appropriate and cooperative throughout examination Head:  Normocephalic and atraumatic without obvious abnormalities. No apparent  alopecia or balding. Ears:  External ear exam shows no significant lesions or deformities.  Otoscopic examination reveals clear canals, tympanic membranes are intact bilaterally without bulging, retraction, inflammation or discharge. Hearing is grossly normal bilaterally. Nose:  no  external deformity, mucosal erythema, and mucosal edema, RT> Lt.  Small amt clear mucus bilat nares Mouth:  Oral mucosa and oropharynx without lesions or exudates.  Teeth in good repair. Neck:  No deformities, masses, or tenderness noted. Lungs:  Normal respiratory effort, chest expands symmetrically. Lungs are clear to auscultation, no crackles or wheezes. Heart:  Normal rate and regular rhythm. S1 and S2 normal without gallop, murmur, click, rub or other extra sounds. Skin:  Intact without suspicious lesions or rashes Cervical Nodes:  No lymphadenopathy noted Psych:  Cognition and judgment appear intact. Alert and cooperative with normal attention span and concentration. 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  Problem # 2:  TINNITUS, RIGHT (ICD-388.30) Assessment: New Secondary to #1 above.  Discusses with pt that this can be due to her ear being plugged.  But this can also be due to other causes, such as hearing loss, or acoustic neuroma.  If it doesn't improve she will need further eval by ENT.  Problem # 3:  ALLERGIC RHINITIS (ICD-477.9) Assessment: Unchanged  Her updated medication list for this problem includes:    Zyrtec Allergy 10 Mg Tabs (Cetirizine hcl) .Marland Kitchen... Take 1 tablet by mouth once a day    Fluticasone Propionate 50 Mcg/act Susp (Fluticasone propionate) ..... Use 2 sprays each nostril once daily  Complete Medication List: 1)  Lisinopril 20 Mg Tabs (Lisinopril) .... One daily 2)  Proair Hfa 108 (90 Base) Mcg/act Aers (Albuterol sulfate) .... 2 puffs every 6 hours as needed 3)  Symbicort 160-4.5 Mcg/act Aero (Budesonide-formoterol fumarate) .... One puff two times a day 4)  Minocycline Hcl 100 Mg Caps (Minocycline hcl) .... One once daily 5)  Mirapex 0.25 Mg Tabs (Pramipexole  dihydrochloride) .... At bedtime 6)  Left Wrist Carpal Tunnel Brace  .... As directed 7)  Celebrex 200 Mg Caps (Celecoxib) .... One daily 8)  Lexapro 10 Mg Tabs (Escitalopram oxalate) .... One daily 9)  Neurontin 100 Mg Caps (Gabapentin) .Marland Kitchen.. 1 by mouth at bedtime 10)  Zyrtec Allergy 10 Mg Tabs (Cetirizine hcl) .... Take 1 tablet by mouth once a day 11)  Ibuprofen 800 Mg Tabs (Ibuprofen) .... Take at bedtime 12)  Fluticasone Propionate 50 Mcg/act Susp (Fluticasone propionate) .... Use 2 sprays each nostril once daily  Patient Instructions: 1)  Please schedule a follow-up appointment as needed. 2)  Continue your current medications. 3)  I have prescribed an allergy nasal spray for you to use once daily. 4)  If your syptoms dont improve in the next 7-10 days please let me know.& I will refer you to an ENT Dr. Prescriptions: FLUTICASONE PROPIONATE 50 MCG/ACT SUSP (FLUTICASONE PROPIONATE) use 2 sprays each nostril once daily  #1 x 2   Entered and Authorized by:   Esperanza Sheets PA   Signed by:   Esperanza Sheets PA on 01/17/2010   Method used:   Electronically to        [facility]  [address] (retail)       [address]       [place]  [place], [place]  [address]       Ph: [phone]       Fax: [phone]   RxID:   [id number]

## 2010-11-08 NOTE — Assessment & Plan Note (Signed)
Summary: NEW PATIENT   Vital Signs:  Patient profile:   55 year old female Menstrual status:  postmenopausal Height:      62.5 inches Weight:      157.25 pounds BMI:     28.41 O2 Sat:      97 % Pulse rate:   106 / minute Pulse rhythm:   regular Resp:     16 per minute BP sitting:   124 / 90  (left arm) Cuff size:   regular  Vitals Entered By: Everitt Amber LPN (November 29, 2009 1:50 PM)  Nutrition Counseling: Patient's BMI is greater than 25 and therefore counseled on weight management options. CC: New patient  Is Patient Diabetic? No     Menstrual Status postmenopausal Last PAP Result normal   Primary Care Provider:  Syliva Overman MD  CC:  New patient .  History of Present Illness: New pt eval uation , main complaint is of chronic, debilaitasting generalised  pain, which is worsening in ther past 18 months. Primarily fingers and toes, toes iniyyally, but includes, low back, non radiating,, hands. Chronic knee pain, from age  68 she was told she prob should have had surgery as baby. She denies any recenrt fever or chills. She denies head or chest congestion. She denies dysuria or frequuency. Preventivescreening is not uTD but she is willing to have this addressed. she repolrts becoming easily irritated, and denies overt depression,but does stte she has anxiety.  Preventive Screening-Counseling & Management  Alcohol-Tobacco     Smoking Status: quit  Current Medications (verified): 1)  Lisinopril 20 Mg  Tabs (Lisinopril) .... One Daily 2)  Proair Hfa 108 (90 Base) Mcg/act  Aers (Albuterol Sulfate) .... 2 Puffs Every 6 Hours As Needed 3)  Symbicort 160-4.5 Mcg/act  Aero (Budesonide-Formoterol Fumarate) .... One Puff Two Times A Day 4)  Minocycline Hcl 100 Mg  Caps (Minocycline Hcl) .... One Once Daily 5)  Mirapex 0.25 Mg Tabs (Pramipexole Dihydrochloride) .... At Bedtime 6)  Left Wrist Carpal Tunnel Brace .... As Directed 7)  Celebrex 200 Mg Caps (Celecoxib) ....  One Daily 8)  Lexapro 10 Mg Tabs (Escitalopram Oxalate) .... One Daily 9)  Neurontin 100 Mg Caps (Gabapentin) .Marland Kitchen.. 1 By Mouth At Bedtime 10)  Zyrtec Allergy 10 Mg Tabs (Cetirizine Hcl) .... Take 1 Tablet By Mouth Once A Day  Allergies (verified): No Known Drug Allergies  Past History:  Family History: Last updated: 11/29/2009 Father: - Does not know him - OA, died   at age 58 Mother: 38 HTN/Lipids Siblings: Sister 57 - W&L, no brothers KIds: 5 x: Girls 38, 28 Boys 81, 64 and 26 - W&L  In 2011  Social History: Last updated: 11/29/2009 Occupation: Technical sales engineer Married  x 24 yrs in 2010  Alcohol use-no Drug use-no education: 12 th grade Lives with husband and daughter Former Smoker quit at 26   Risk Factors: Caffeine Use: 1 (05/05/2008)  Risk Factors: Smoking Status: quit (11/29/2009)  Past medical, surgical, family and social histories (including risk factors) reviewed, and no changes noted (except as noted below).  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) ASTHMA (ICD-493.90) VERTIGO (ICD-780.4) specidifc triggers eg assymetrical ceilings, duration since approx 2000, also has motion sickness, responds to dramamine CARPAL TUNNEL SYNDROME (ICD-354.0) htn  dx in  2006 ARTHRITIS (ICD-716.90) CONSTIPATION (ICD-564.00) HYPERTENSION (ICD-401.9) ALLERGIC RHINITIS (ICD-477.9) Fibromyalgia dx in 2010, excellent response to neurontin prescribed by Dr Romeo Apple Rosacea since 1992, uses minocylcline for flares  Past Surgical History: Caesarean section 1989 1nd 1991 Carpal tunnel release - right - 2003 Jan 21s, 2011t-right  shoulder surgery for impingement  dr Romeo Apple  Family History: Reviewed history from 12/14/2008 and no changes required. Father: - Does not know him - OA, died   at age 44 Mother: 2 HTN/Lipids Siblings: Sister 37 - W&L, no brothers KIds: 5 x: Girls 54, 28 Boys 53, 10 and 51 - W&L  In 2011  Social History: Reviewed history from 05/06/2009 and no changes required. Occupation: Technical sales engineer Married  x 24 yrs in 2010  Alcohol use-no Drug use-no education: 12 th grade Lives with husband and daughter Former Smoker quit at 26  Smoking Status:  quit  Review of Systems      See HPI General:  Denies chills, fever, and malaise. Eyes:  Denies blurring and discharge. CV:  Complains of fatigue and palpitations; denies chest pain or discomfort, difficulty breathing at night, and swelling of feet; intermittent "pounding" of hr heart on avg once every 6 months, as though heart wll come out of her chest,and followed by heart racing for approx 5 mins, no light headedness, but out of breath as though she had been running up stairs, std about 2004. Resp:  Denies cough, shortness of breath, and sputum productive. GI:  Denies abdominal pain, constipation, diarrhea, nausea, and vomiting. GU:  Denies abnormal vaginal bleeding, dysuria, and urinary frequency. MS:  Complains of joint pain, low back pain, mid back pain, and stiffness. Psych:  Complains of anxiety and irritability; denies suicidal thoughts/plans, thoughts of violence, and unusual visions or sounds. Endo:  Denies cold intolerance, excessive hunger, excessive thirst, excessive urination, heat intolerance, polyuria, and weight change. Heme:  Denies abnormal bruising and bleeding. Allergy:  Complains of seasonal allergies; denies hives or rash and itching eyes.  Physical Exam  General:  Well-developed,well-nourished,in no acute distress; alert,appropriate and cooperative throughout examination HEENT: No facial asymmetry,  EOMI, No  sinus tenderness, TM's Clear, oropharynx  pink and moist.   Chest: Clear to auscultation bilaterally.  CVS: S1, S2, No murmurs, No S3.   Abd: Soft, Nontender.  MS: Adequate ROM spine, hips, shoulders and knees.  Ext: No edema.   CNS: CN 2-12 intact, power tone and sensation normal throughout.   Skin: Intact, no visible lesions or rashes.  Psych: Good eye contact, normal affect.  Memory intact,  anxious though not depressed appearing.    Complete Medication List: 1)  Lisinopril 20 Mg Tabs (Lisinopril) .... One daily 2)  Proair Hfa 108 (90 Base) Mcg/act Aers (Albuterol sulfate) .... 2 puffs every 6 hours as needed 3)  Symbicort 160-4.5 Mcg/act Aero (Budesonide-formoterol fumarate) .... One puff two times a day 4)  Minocycline Hcl 100 Mg Caps (Minocycline hcl) .... One once daily 5)  Mirapex 0.25 Mg Tabs (Pramipexole dihydrochloride) .... At bedtime 6)  Left Wrist Carpal Tunnel Brace  .... As directed 7)  Celebrex 200 Mg Caps (Celecoxib) .... One daily 8)  Lexapro 10 Mg Tabs (Escitalopram oxalate) .... One daily 9)  Neurontin 100 Mg Caps (Gabapentin) .Marland Kitchen.. 1 by mouth at bedtime 10)  Zyrtec Allergy 10 Mg Tabs (Cetirizine hcl) .... Take 1 tablet by mouth once a day  Other Orders: T-Basic Metabolic Panel [phone]) T-Lipid Profile [id number]) T-CBC w/Diff [id number]) T-TSH [phone]) Cardiology  Referral (Cardiology) Gastroenterology Referral (GI) Rheumatology Referral (Rheumatology)  Patient Instructions: 1)  CPE ij 2 months. 2)  Your BP is high 124/90, pls cut down on salt, regular physical activity and weight loss will help with this. 3)  BMP prior to visit, ICD-9: 4)  Lipid Panel prior to visit, ICD-9: 5)  TSH prior to visit, ICD-9:   fasting asap   first week in March 6)  CBC w/ Diff prior to visit, ICD-9: 7)  You will be referred to rheumatology, cardiology , colonscopy,

## 2010-11-08 NOTE — Assessment & Plan Note (Signed)
Summary: physical   Vital Signs:  Patient profile:   55 year old female Menstrual status:  postmenopausal Height:      62.5 inches Weight:      161.25 pounds BMI:     29.13 O2 Sat:      98 % Pulse rate:   92 / minute Pulse rhythm:   regular Resp:     16 per minute BP sitting:   112 / 80  (left arm) Cuff size:   regular  Vitals Entered By: Everitt Amber LPN (March 30, 2010 11:04 AM)  Nutrition Counseling: Patient's BMI is greater than 25 and therefore counseled on weight management options. CC: CPE   Vision Screening:Left eye with correction: 20 / 20 Right eye with correction: 20 / 20 Both eyes with correction: 20 / 20  Color vision testing: normal      Vision Entered By: Everitt Amber LPN (March 30, 2010 11:04 AM)   Primary Care Provider:  Syliva Overman MD  CC:  CPE .  History of Present Illness: Reports  that she has been doing fairly well. She has had no recent fever or chills. Denies sinus pressure,  , ear pain or sore throat. Denies chest congestion, or cough productive of sputum. Denies chest pain, palpitations, PND, orthopnea or leg swelling. Denies abdominal pain, nausea, vomitting, diarrhea or constipation. Denies change in bowel movements or bloody stool. Denies dysuria , frequency, incontinence or hesitancy. Denies  joint pain, swelling, or reduced mobility. Denies headaches, vertigo, seizures.  Denies  rash, lesions, or itch.     Current Medications (verified): 1)  Lisinopril 20 Mg  Tabs (Lisinopril) .... One Daily 2)  Proair Hfa 108 (90 Base) Mcg/act  Aers (Albuterol Sulfate) .... 2 Puffs Every 6 Hours As Needed 3)  Symbicort 160-4.5 Mcg/act  Aero (Budesonide-Formoterol Fumarate) .... One Puff Two Times A Day 4)  Minocycline Hcl 100 Mg  Caps (Minocycline Hcl) .... One Once Daily 5)  Mirapex 0.25 Mg Tabs (Pramipexole Dihydrochloride) .... At Bedtime 6)  Left Wrist Carpal Tunnel Brace .... As Directed 7)  Celebrex 200 Mg Caps (Celecoxib) .... One  Daily 8)  Lexapro 10 Mg Tabs (Escitalopram Oxalate) .... One Daily 9)  Neurontin 100 Mg Caps (Gabapentin) .Marland Kitchen.. 1 By Mouth At Bedtime 10)  Zyrtec Allergy 10 Mg Tabs (Cetirizine Hcl) .... Take 1 Tablet By Mouth Once A Day 11)  Ibuprofen 800 Mg Tabs (Ibuprofen) .... Take At Bedtime 12)  Fluticasone Propionate 50 Mcg/act Susp (Fluticasone Propionate) .... Use 2 Sprays Each Nostril Once Daily  Allergies (verified): No Known Drug Allergies  Review of Systems      See HPI General:  Complains of fatigue. Eyes:  Denies blurring, discharge, eye pain, and red eye. ENT:  Complains of nasal congestion and postnasal drainage; denies sinus pressure and sore throat. Psych:  Complains of anxiety, depression, irritability, and mental problems; denies suicidal thoughts/plans, thoughts of violence, and unusual visions or sounds; uncontrolled symptons , worse in the past 3 to4 months. Endo:  Denies cold intolerance, excessive hunger, excessive thirst, excessive urination, heat intolerance, polyuria, and weight change. Heme:  Denies abnormal bruising and bleeding. Allergy:  Complains of itching eyes, seasonal allergies, and sneezing; increased and uncontrolled allergy symptoms x 2 mopths.  Physical Exam  General:  Well-developed,well-nourished,in no acute distress; alert,appropriate and cooperative throughout examination Head:  Normocephalic and atraumatic without obvious abnormalities. No apparent alopecia or balding. Eyes:  No corneal or conjunctival inflammation noted. EOMI. Perrla. Funduscopic exam benign, without hemorrhages, exudates  or papilledema. Vision grossly normal. Ears:  External ear exam shows no significant lesions or deformities.  Otoscopic examination reveals clear canals, tympanic membranes are intact bilaterally without bulging, retraction, inflammation or discharge. Hearing is grossly normal bilaterally. Nose:  External nasal examination shows no deformity or inflammation. Nasal mucosa are  pink and moist without lesions or exudates. Mouth:  pharynx pink and moist and fair dentition.   Neck:  No deformities, masses, or tenderness noted. Chest Wall:  No deformities, masses, or tenderness noted. Breasts:  No mass, nodules, thickening, tenderness, bulging, retraction, inflamation, nipple discharge or skin changes noted.   Lungs:  Normal respiratory effort, chest expands symmetricallydecrerased air entry with few wheezes, no crackles Heart:  Normal rate and regular rhythm. S1 and S2 normal without gallop, murmur, click, rub or other extra sounds. Abdomen:  Bowel sounds positive,abdomen soft and non-tender without masses, organomegaly or hernias noted. Rectal:  No external abnormalities noted. Normal sphincter tone. No rectal masses or tenderness.Guaic neg stool Genitalia:  Normal introitus for age, no external lesions, no vaginal discharge, mucosa pink and moist, no vaginal or cervical lesions, no vaginal atrophy, no friaility or hemorrhage, normal uterus size and position, no adnexal masses or tenderness Msk:  No deformity or scoliosis noted of thoracic or lumbar spine.   Pulses:  R and L carotid,radial,femoral,dorsalis pedis and posterior tibial pulses are full and equal bilaterally Extremities:  No clubbing, cyanosis, edema, or deformity noted with normal full range of motion of all joints.   Neurologic:  No cranial nerve deficits noted. Station and gait are normal. Plantar reflexes are down-going bilaterally. DTRs are symmetrical throughout. Sensory, motor and coordinative functions appear intact. Skin:  Intact without suspicious lesions or rashes Cervical Nodes:  No lymphadenopathy noted Axillary Nodes:  No palpable lymphadenopathy Inguinal Nodes:  No significant adenopathy Psych:  Oriented X3, memory intact for recent and remote, not anxious appearing, and moderately anxious.     Impression & Recommendations:  Problem # 1:  ASTHMA (ICD-493.90) Assessment Deteriorated  Her  updated medication list for this problem includes:    Proair Hfa 108 (90 Base) Mcg/act Aers (Albuterol sulfate) .Marland Kitchen... 2 puffs every 6 hours as needed    Symbicort 160-4.5 Mcg/act Aero (Budesonide-formoterol fumarate) ..... One puff two times a day  Problem # 2:  ALLERGIC RHINITIS (ICD-477.9) Assessment: Comment Only  Her updated medication list for this problem includes:    Zyrtec Allergy 10 Mg Tabs (Cetirizine hcl) .Marland Kitchen... Take 1 tablet by mouth once a day    Fluticasone Propionate 50 Mcg/act Susp (Fluticasone propionate) ..... Use 2 sprays each nostril once daily depomedrol administered  Problem # 3:  HYPERTENSION (ICD-401.9) Assessment: Unchanged  BP today: 112/80 Prior BP: 100/70 (01/17/2010) continue lisinopril as before Prior 10 Yr Risk Heart Disease: 7 % (06/08/2009)  Labs Reviewed: K+: 4.6 (12/03/2008) Creat: : 0.77 (12/03/2008)   Chol: 239 (12/03/2008)   HDL: 48 (12/03/2008)   LDL: 163 (12/03/2008)   TG: 138 (12/03/2008)  Problem # 4:  DEPRESSION (ICD-311) Assessment: Deteriorated  The following medications were removed from the medication list:    Lexapro 10 Mg Tabs (Escitalopram oxalate) ..... One daily Her updated medication list for this problem includes:    Lexapro 20 Mg Tabs (Escitalopram oxalate) .Marland Kitchen... Take 1 tablet by mouth once a day  Discussed treatment options, including trial of antidpressant medication. Will refer to behavioral health. Follow-up call in in 24-48 hours and recheck in 2 weeks, sooner as needed. Patient agrees to call if any  worsening of symptoms or thoughts of doing harm arise. Verified that the patient has no suicidal ideation at this time.   Complete Medication List: 1)  Lisinopril 20 Mg Tabs (Lisinopril) .... One daily 2)  Proair Hfa 108 (90 Base) Mcg/act Aers (Albuterol sulfate) .... 2 puffs every 6 hours as needed 3)  Symbicort 160-4.5 Mcg/act Aero (Budesonide-formoterol fumarate) .... One puff two times a day 4)  Minocycline Hcl 100 Mg  Caps (Minocycline hcl) .... One once daily 5)  Mirapex 0.25 Mg Tabs (Pramipexole dihydrochloride) .... At bedtime 6)  Left Wrist Carpal Tunnel Brace  .... As directed 7)  Celebrex 200 Mg Caps (Celecoxib) .... One daily 8)  Neurontin 100 Mg Caps (Gabapentin) .Marland Kitchen.. 1 by mouth at bedtime 9)  Zyrtec Allergy 10 Mg Tabs (Cetirizine hcl) .... Take 1 tablet by mouth once a day 10)  Ibuprofen 800 Mg Tabs (Ibuprofen) .... Take at bedtime 11)  Fluticasone Propionate 50 Mcg/act Susp (Fluticasone propionate) .... Use 2 sprays each nostril once daily 12)  Lexapro 20 Mg Tabs (Escitalopram oxalate) .... Take 1 tablet by mouth once a day  Other Orders: T-Basic Metabolic Panel [id number]) T-Lipid Profile [id number]) T-TSH [id number]) T-CBC w/Diff [phone]) T-Vitamin D (25-Hydroxy) [phone]) Depo- Medrol 80mg  (J1040) Admin of Therapeutic Inj  intramuscular or subcutaneous (02725) Tdap => 35yrs IM (36644) Admin 1st Vaccine (03474) Pap Smear (25956) Hemoccult Guaiac-1 spec.(in office) (38756)  Patient Instructions: 1)  Please schedule a follow-up appointment in 2.5 months. 2)  BMP prior to visit, ICD-9: 3)  Lipid Panel prior to visit, ICD-9: 4)  TSH prior to visit, ICD-9:   today 5)  CBC w/ Diff prior to visit, ICD-9: 6)  vit D 7)  You will get depomedrol for your asthma and allergies. pls take the prednisone dose pack. 8)  You will get TdaP today Prescriptions: LEXAPRO 20 MG TABS (ESCITALOPRAM OXALATE) Take 1 tablet by mouth once a day  #30 x 3   Entered and Authorized by:   Syliva Overman MD   Signed by:   Syliva Overman MD on 03/30/2010   Method used:   Printed then faxed to ...       [facility] (retail)       [address]       [place], [place]  [address]       Ph: [phone]       Fax: [phone]   RxID:   [id number]      Immunizations Administered:  Tetanus Vaccine:    Vaccine Type: Tdap    Site: right deltoid    Mfr:  GlaxoSmithKline    Dose: 0.5 ml    Route: IM    Given by: Adella Hare LPN    Exp. Date: 01/01/2012    Lot #: [id number]    VIS given: 08/27/07 version given March 30, 2010.    Medication Administration  Injection # 1:    Medication: Depo- Medrol 80mg     Diagnosis: TINNITUS, RIGHT (ICD-388.30)    Route: IM    Site: RUOQ gluteus    Exp Date: 3/12    Lot #: [id number]    Mfr: [employer]    Patient tolerated injection without complications    Given by: Adella Hare LPN (March 30, 2010 12:01 PM)  Orders Added: 1)  Est. Patient 40-64 years [99396] 2)  T-Basic Metabolic Panel [phone] 3)  T-Lipid Profile [26948-54627] 4)  T-TSH [03500-93818] 5)  T-CBC w/Diff [  85025-10010] 6)  T-Vitamin D (25-Hydroxy) [phone] 7)  Depo- Medrol 80mg  [J1040] 8)  Admin of Therapeutic Inj  intramuscular or subcutaneous [96372] 9)  Tdap => 13yrs IM [90715] 10)  Admin 1st Vaccine [90471] 11)  Pap Smear [88150] 12)  Hemoccult Guaiac-1 spec.(in office) [82270]    Laboratory Results    Stool - Occult Blood Hemmoccult #1: negative Date: 03/30/2010 Comments: [id number] 9R 8/11 118 10/12    Appended Document: physical pls correct the reason for giving depomedrol, it is either allergie or asthma, tinnitus , which is "ringing in the ears" is not treated with depomedrol

## 2010-11-08 NOTE — Assessment & Plan Note (Signed)
Summary: pain- room 1   Vital Signs:  Patient profile:   55 year old female Menstrual status:  postmenopausal Height:      62.5 inches Weight:      163.50 pounds BMI:     29.53 O2 Sat:      97 % on Room air Pulse rate:   100 / minute Resp:     16 per minute BP sitting:   110 / 80  (left arm)  Vitals Entered By: Adella Hare LPN (June 28, 2010 1:27 PM) CC: sciatic pain Is Patient Diabetic? No Comments did not bring meds to ov   Primary Provider:  Syliva Overman MD  CC:  sciatic pain.  History of Present Illness: Pt continues to have pain Lt buttock down leg and in groin x > 3 mos.  NSAIDs limited help, and now is having stomach upset. She discontinued Ibuprofen, but has been taking Aleve and tylenol off & on.  Low back pain with certain movements. Went to physical therapy but is not helping.  Pain bothers her daily activities and sleep.  No loss of bowel or bladder control. + numbness and tingling in foot   Allergies: No Known Drug Allergies  Review of Systems GI:  Complains of loss of appetite and nausea; denies vomiting. MS:  Complains of low back pain and muscle aches. Neuro:  Complains of numbness and tingling.  Physical Exam  General:  Well-developed,well-nourished,in no acute distress; alert,appropriate and cooperative throughout examination Head:  Normocephalic and atraumatic without obvious abnormalities. No apparent alopecia or balding. Lungs:  Normal respiratory effort, chest expands symmetrically. Lungs are clear to auscultation, no crackles or wheezes. Heart:  Normal rate and regular rhythm. S1 and S2 normal without gallop, murmur, click, rub or other extra sounds. Msk:  LS Spine:  Nontender to palp spinous processes and SI joints.  Neg SLR bilat Extremities:  No clubbing, cyanosis, edema, or deformity noted with normal full range of motion of all joints.   Neurologic:  alert & oriented X3, sensation intact to light touch, and DTRs symmetrical and  normal.  Ambulating with cane. Skin:  Intact without suspicious lesions or rashes Psych:  Cognition and judgment appear intact. Alert and cooperative with normal attention span and concentration. No apparent delusions, illusions, hallucinations   Impression & Recommendations:  Problem # 1:  SCIATICA, LEFT (ICD-724.3) Assessment Unchanged  The following medications were removed from the medication list:    Celebrex 200 Mg Caps (Celecoxib) ..... One daily    Ibuprofen 800 Mg Tabs (Ibuprofen) .Marland Kitchen... Take at bedtime Her updated medication list for this problem includes:    Cyclobenzaprine Hcl 10 Mg Tabs (Cyclobenzaprine hcl) .Marland Kitchen... Take 1 tab every 8 hrs as needed for muscle spasm    Tramadol Hcl 50 Mg Tabs (Tramadol hcl) .Marland Kitchen... Take 1-2 every 6 hrs as needed for pain  Orders: T-MRI Lumbar spine w/o contrast ([address])  Problem # 2:  GASTRITIS (ICD-535.50) Assessment: New Advised pt to discontinue NSAIDs and use Tylenol & Tramadol as needed instead. If syptoms persist in 2 weeks will need GI referral.  Complete Medication List: 1)  Lisinopril 20 Mg Tabs (Lisinopril) .... One daily 2)  Proair Hfa 108 (90 Base) Mcg/act Aers (Albuterol sulfate) .... 2 puffs every 6 hours as needed 3)  Symbicort 160-4.5 Mcg/act Aero (Budesonide-formoterol fumarate) .... One puff two times a day 4)  Minocycline Hcl 100 Mg Caps (Minocycline hcl) .... One once daily 5)  Mirapex 0.25 Mg Tabs (Pramipexole dihydrochloride) .... At  bedtime 6)  Left Wrist Carpal Tunnel Brace  .... As directed 7)  Neurontin 100 Mg Caps (Gabapentin) .Marland Kitchen.. 1 by mouth at bedtime 8)  Zyrtec Allergy 10 Mg Tabs (Cetirizine hcl) .... Take 1 tablet by mouth once a day 9)  Fluticasone Propionate 50 Mcg/act Susp (Fluticasone propionate) .... Use 2 sprays each nostril once daily 10)  Lexapro 20 Mg Tabs (Escitalopram oxalate) .... Take 1 tablet by mouth once a day 11)  Neurontin 100 Mg Caps (Gabapentin) .Marland Kitchen.. 1 by mouth q 4 hrs as needed pain 12)   Cyclobenzaprine Hcl 10 Mg Tabs (Cyclobenzaprine hcl) .... Take 1 tab every 8 hrs as needed for muscle spasm 13)  Tramadol Hcl 50 Mg Tabs (Tramadol hcl) .... Take 1-2 every 6 hrs as needed for pain  Other Orders: Influenza Vaccine NON MCR ([address]782)  Patient Instructions: 1)  Please schedule a follow-up appointment in 1 month. 2)  I have ordered a MRI of your back. 3)  Continue your stretches as advised by physical therapy. 4)  Continue Tylenol as needed. 5)  I have prescribed Tramadol for pain, and Flexeril as a muscle relaxer. 6)  Take over the counter Omeprazole once a day for 10-14 days to help with your stomach.  If it continues to bother you in 2 weeks call for GI referral. Prescriptions: LEXAPRO 20 MG TABS (ESCITALOPRAM OXALATE) Take 1 tablet by mouth once a day  #30 x 3   Entered and Authorized by:   Esperanza Sheets PA   Signed by:   Esperanza Sheets PA on 06/28/2010   Method used:   Electronically to        [facility]  [address] (retail)       [address]       [place], [place]  [address]       Ph: [phone]       Fax: [phone]   RxID:   [id number] LISINOPRIL 20 MG  TABS (LISINOPRIL) One daily  #30 Each x 3   Entered and Authorized by:   Esperanza Sheets PA   Signed by:   Esperanza Sheets PA on 06/28/2010   Method used:   Electronically to        [facility]  [address] (retail)       [address]       [place], [place]  [address]       Ph: [phone]       Fax: [phone]   RxID:   [id number] TRAMADOL HCL 50 MG TABS (TRAMADOL HCL) take 1-2 every 6 hrs as needed for pain  #40 x 1   Entered and Authorized by:   Esperanza Sheets PA   Signed by:   Esperanza Sheets PA on 06/28/2010   Method used:   Electronically to        [facility]  [address] (retail)       [address]       [place], [place]  [address]       Ph: [phone]       Fax: [phone]   RxID:   [id number] CYCLOBENZAPRINE HCL 10 MG TABS (CYCLOBENZAPRINE  HCL) take 1 tab every 8 hrs as needed for muscle spasm  #30 x 0   Entered and Authorized by:   Esperanza Sheets PA   Signed by:   Alvis Lemmings  Garnette Czech PA on 06/28/2010   Method used:   Electronically to        [facility]  [address] (retail)       [address]       [place], [place]  [address]       Ph: [phone]       Fax: [phone]   RxID:   [id number]    Influenza Vaccine    Vaccine Type: Fluvax Non-MCR    Site: left deltoid    Mfr: novartis    Dose: 0.5 ml    Route: IM    Given by: Adella Hare LPN    Exp. Date: 02/2011    Lot #: [id number]    VIS given: 05/03/10 version given June 28, 2010.

## 2010-11-08 NOTE — Progress Notes (Signed)
Summary: rx  Phone Note Call from Patient   Summary of Call: needs her lisinopril and mirapex filled  [facility] in [place] call back at [phone] to let her know Initial call taken by: Lind Guest,  Feb 23, 2010 10:55 AM  Follow-up for Phone Call        Phone Call Completed, refills sent yesterday Follow-up by: Adella Hare LPN,  Feb 23, 2010 3:26 PM

## 2010-11-08 NOTE — Assessment & Plan Note (Signed)
Summary: NP3 PALPITATIONS   Primary Provider:  Syliva Overman MD   History of Present Illness: It was my pleasure evaluating this nice woman at the request of Dr. Lodema Hong for palpitations and dyspnea.  Teresa Soto has no known cardiovascular disease except for mild hypertension, and has never been evaluated by a cardiologist.  An echocardiogram was performed in June of 2010 with entirely normal results.  She has never had a stress test nor an angiographic procedure.  She describes her symptoms as chronic.  She notes an occasional single palpitation, that is quite strong, and feels as if her heart is coming out of her chest.  She frequently will immediately develop a sense of a regular tachycardia that persists for a few minutes.  There are no other associated symptoms.  These spells are quite infrequent.  She does not believe that one has happened in calendar year 2011  She also experiences exertional dyspnea, which is relatively recent.  There is no associated chest discomfort.  Symptoms are promptly relieved with rest.  She has been inactive of late due to right shoulder surgery and progressive weight gain over the past 10 years.   Current Medications (verified): 1)  Lisinopril 20 Mg  Tabs (Lisinopril) .... One Daily 2)  Proair Hfa 108 (90 Base) Mcg/act  Aers (Albuterol Sulfate) .... 2 Puffs Every 6 Hours As Needed 3)  Symbicort 160-4.5 Mcg/act  Aero (Budesonide-Formoterol Fumarate) .... One Puff Two Times A Day 4)  Minocycline Hcl 100 Mg  Caps (Minocycline Hcl) .... One Once Daily 5)  Mirapex 0.25 Mg Tabs (Pramipexole Dihydrochloride) .... At Bedtime 6)  Left Wrist Carpal Tunnel Brace .... As Directed 7)  Celebrex 200 Mg Caps (Celecoxib) .... One Daily 8)  Lexapro 10 Mg Tabs (Escitalopram Oxalate) .... One Daily 9)  Neurontin 100 Mg Caps (Gabapentin) .Marland Kitchen.. 1 By Mouth At Bedtime 10)  Zyrtec Allergy 10 Mg Tabs (Cetirizine Hcl) .... Take 1 Tablet By Mouth Once A Day 11)  Ibuprofen 800 Mg  Tabs (Ibuprofen) .... 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; resides locally; 4 children, one of whom continues to live at home Alcohol use-no Drug use-no Education: 12 th grade Former Smoker quit at 26 after 2 pack year consumption   Past Medical History: ASTHMA, WITH ACUTE EXACERBATION (ICD-493.92)  dx in 2006 by [facility] Hypertension-diagnosed in 2006 HYPERLIPIDEMIA (ICD-272.4) Palpitations Dyspnea CLIMACTERIC STATE, 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) LATERAL EPICONDYLITIS, RIGHT (ICD-726.32) INSOMNIA (ICD-780.52) HAIR LOSS (ICD-704.00) OSTEOARTHROSIS, GENERALIZED, MULTIPLE SITES (ICD-715.09) FOLLOW-UP, HIGH RISK TREATMENT NEC (ICD-V67.51) FIBROADENOMA, 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 Harrison)-10/2009  Social History: Occupation: Chief Executive Officer Married  x 24 yrs; resides locally; 4 children, one of whom continues to live  at home Alcohol use-no Drug use-no Education: 12 th grade Former Smoker quit at 26 after 2 pack year consumption   Review of Systems        Requires corrective lenses; intermittent vertigo; history of heart murmur as a child; arthritic discomfort in the fingers; peripheral edema; history of asthma without much wheezing or coughing.    Chronic back pain, occasional headaches, insomnia, constipation.  All of the systems reviewed and are negative.  Menopause was 3 years ago.  Vital Signs:  Patient profile:   55 year old female Menstrual status:  postmenopausal Weight:      161 pounds Pulse rate:   106 / minute BP sitting:   114 / 82  (right arm)  Vitals Entered By: Dreama Saa, CNA (December 28, 2009 1:22 PM)  Physical Exam  General:    Well-developed; no acute distress; mildly overweight Weight-161 pounds HEENT-[place]/AT; PERRL; EOM intact; conjunctiva and lids nl; thinning hair Neck-No JVD; no carotid bruits: Endocrine-No thyromegaly: Lungs-No tachypnea, clear without rales, rhonchi or wheezes: CV-normal PMI; normal S1 and S2; S4 present Abdomen-BS normal; soft and non-tender without masses or organomegaly: MS-No deformities, cyanosis or clubbing; post operative right shoulder with continued swelling and a well-healed incision Neurologic-Nl cranial nerves; symmetric strength and tone: Skin- Warm, no sig. lesions; multiple tattoos Extremities-Nl distal pulses; 1+ edema    Impression & Recommendations:  Problem # 1:  PALPITATIONS (ICD-785.1) Isolated palpitations almost certainly represent single PACs or PVCs.  Patient is concerned about the possibility of coronary disease due to a positive family history.  I reassured her that the symptoms would be highly unlikely to reflect coronary atherosclerosis.  Moreover, her risk factors for CAD are modest.  Due to the extreme infrequency of symptoms, no further evaluation is warranted.  A TSH level has already been obtained and is normal.  Problem # 2:  DYSPNEA (ICD-786.05) Patient is very dissatisfied with her current exercise tolerance.  A fairly recent echocardiogram indicates  normal LV systolic function at rest and excludes valvular heart disease.  We will proceed with a stress echocardiogram to rule out myocardial ischemia, exercise-induced hypoxemia and to evaluate exercise capacity.  Problem # 3:  HYPERTENSION (ICD-401.9) Blood pressure control is excellent on a single agent, which should be continued.  Problem # 4:  HYPERLIPIDEMIA (ICD-272.4)  Recent lipid profile is suboptimal, but patient has no known vascular disease and no diabetes.  Consideration could be given to increasing her dose of pravastatin or switching to a more potent statin.  I will reassess this nice woman once her stress test has been completed.  Other Orders: Stress Echo (Stress Echo)  Patient Instructions: 1)  Your physician recommends that you schedule a follow-up appointment in: after testing 2)  Your physician has requested that you have a stress echocardiogram. For further information please visit [email or web address]  Please follow instruction sheet as given.

## 2010-11-08 NOTE — Miscellaneous (Signed)
Summary: LABS CBCD,CMP,LIPIIDS,LIVER,TSH,12/03/2008  Clinical Lists Changes  Observations: Added new observation of CALCIUM: 10.5 mg/dL (16/07/9603 54:09) Added new observation of ALBUMIN: 4.0 g/dL (81/19/1478 29:56) Added new observation of PROTEIN, TOT: 6.9 g/dL (21/30/8657 84:69) Added new observation of SGPT (ALT): 8 units/L (12/03/2008 16:43) Added new observation of SGOT (AST): 9 units/L (12/03/2008 16:43) Added new observation of ALK PHOS: 84 units/L (12/03/2008 16:43) Added new observation of CREATININE: 0.77 mg/dL (62/95/2841 32:44) Added new observation of BUN: 14 mg/dL (10/11/7251 66:44) Added new observation of BG RANDOM: 83 mg/dL (03/47/4259 56:38) Added new observation of CO2 PLSM/SER: 23 meq/L (12/03/2008 16:43) Added new observation of CL SERUM: 107 meq/L (12/03/2008 16:43) Added new observation of K SERUM: 4.6 meq/L (12/03/2008 16:43) Added new observation of NA: 141 meq/L (12/03/2008 16:43) Added new observation of LDL: 163 mg/dL (75/64/3329 51:88) Added new observation of HDL: 48 mg/dL (41/66/0630 16:01) Added new observation of TRIGLYC TOT: 138 mg/dL (09/32/3557 32:20) Added new observation of CHOLESTEROL: 239 mg/dL (25/42/7062 37:62) Added new observation of PLATELETK/UL: 308 K/uL (12/03/2008 16:43) Added new observation of MCV: 89.5 fL (12/03/2008 16:43) Added new observation of HCT: 39.3 % (12/03/2008 16:43) Added new observation of HGB: 13.0 g/dL (83/15/1761 60:73) Added new observation of WBC COUNT: 6.0 10*3/microliter (12/03/2008 16:43) Added new observation of TSH: 1.102 microintl units/mL (12/03/2008 16:43)

## 2010-11-08 NOTE — Letter (Signed)
Summary: Appointment - Missed  [facility]  [address], [place] [address]   Phone: [phone]  Fax: [phone]     March 17, 2010 MRN: [id number]   Teresa Soto [address] [place], [place]  [address]   Dear [name],  Our records indicate you missed your appointment on 01/18/10 for Stress Echo.                                It is very important that we reach you to reschedule this appointment. We look forward to participating in your health care needs. Please contact us at the number listed above at your earliest convenience to reschedule this appointment.     Sincerely,    Glass blower/designer

## 2010-11-08 NOTE — Assessment & Plan Note (Signed)
Summary: 1 M RE-CK SHOULDER/POST OP SURG 10/29/09/BCBS/CAF   Visit Type:  Follow-up Primary Provider:  Syliva Overman MD  CC:  post op shoulder.  History of Present Illness:   I saw Teresa Soto in the office today for a followup visit.  She is a 55 years old woman with the complaint of:  RIGHT SHOULDER.  DOS   10-29-09.  Procedure   Arthroscopy right shoulder, subacromial decompression.  Medication:  No meds needed, Tylenol at night.  Today is 1 month recheck after therapy, therapy progress note for review from 12/03/09.  Complains of pain at night when she's sleeping on her RIGHT side  Her motion is full and elevation near full in internal rotation she has some weakness in the rotator cuff supraspinatus  Otherwise doing well recommend strengthening exercises come back in 4 weeks       Allergies: No Known Drug Allergies   Impression & Recommendations:  Problem # 1:  IMPINGEMENT SYNDROME (ICD-726.2) Assessment Improved  Patient Instructions: 1)  Complete 8 more visits of therapy 2)  come back in 4 weeks

## 2010-11-08 NOTE — Assessment & Plan Note (Signed)
Summary: RE-CK SHOULDER FOL'G PT/BCBS/CAF   Visit Type:  Follow-up Primary Provider:  Franchot Heidelberg, MD  CC:  right shoulder pain .  History of Present Illness: I saw Margaux Ludlam in the office today for a followup visit.  She is a 55 years old woman with the complaint of:  right shoulder pain   DX: right shoulder bursitis, tendinitis.  Treatment: PT and injection.  MEDS:  Celebrex  Complaints:  the therapy made her pain worse.  Pain raising arm out to the side, no pain raising arm straight out in front of her.  Today, scheduled for: 6 week recheck shoulder after PT.  she is not better   she has pain with abduction only.   History:  increasing pain for the last 2 months associated with decreased range of motion and painful range of motion. The RIGHT shoulder joint. No numbness or tingling. She has taken some Celebrex has not improved, Neurontin for fibromyalgia, with no relief. She has catching, locking, and popping with stiffness.      Current Medications (verified): 1)  Lisinopril 20 Mg  Tabs (Lisinopril) .... One Daily 2)  Proair Hfa 108 (90 Base) Mcg/act  Aers (Albuterol Sulfate) .... 2 Puffs Every 6 Hours As Needed 3)  Symbicort 160-4.5 Mcg/act  Aero (Budesonide-Formoterol Fumarate) .... One Puff Two Times A Day 4)  Minocycline Hcl 100 Mg  Caps (Minocycline Hcl) .... One Once Daily 5)  Mirapex 0.25 Mg Tabs (Pramipexole Dihydrochloride) .... At Bedtime 6)  Left Wrist Carpal Tunnel Brace .... As Directed 7)  Celebrex 200 Mg Caps (Celecoxib) .... One Daily 8)  Lexapro 10 Mg Tabs (Escitalopram Oxalate) .... One Daily 9)  Neurontin 100 Mg Caps (Gabapentin) .Marland Kitchen.. 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) ASTHMA (ICD-493.90) VERTIGO (ICD-780.4) CARPAL TUNNEL SYNDROME (ICD-354.0) htn ARTHRITIS (ICD-716.90) CONSTIPATION (ICD-564.00) HYPERTENSION (ICD-401.9) ALLERGIC RHINITIS (ICD-477.9)  Past Surgical History: Last updated: 10/21/2008 Caesarean section Carpal tunnel release - right arm - 2003  Family History: Last updated: 12/14/2008 Father: 37 - Does not know him - OA, Lung Cancer Mother: 66 HTN/Lipids Siblings: Sister 49 - W&L KIds: 5 x: Girls 17, 28 Boys 19, 31, 73 - W&L  Social History: Last updated: 05/06/2009 Occupation: Technical sales engineer Married Never Smoked Alcohol use-no Drug use-no education: 12 th grade Lives with husband and daughter  Risk Factors: Caffeine Use: 1 (05/05/2008)  Risk Factors: Smoking Status: never (01/11/2007)  Review of Systems MS:  See HPI.  The review of systems is negative for General, Cardiac , Resp, GI, GU, Neuro, Endo, Psych, Derm, EENT, Immunology, and Lymphatic.  Physical Exam  Msk:  vital signs see recorded data are stable  General appearance normal    Orientation x3 normal  Mood and affect. Normal  Gait and station normal  Cardiovascular pulse temperature, normal, and all limbs  Lymph nodes normal in all limbs  Sensation normal in the lower  extremities; carpal tunnel in the upper extrems   Deep tendon reflexes, normal in both extremities  Coordination and balance is normal  Body area: LOWER EXTREMS: Normal alignment and no atrophy, subluxation or tremor or contracture     Extremities:  the RIGHT shoulder has full range  of motion. There is tenderness in the anterolateral acromial and deltoid area. There is no swelling. There is no warmth.  She has pain with internal rotation and abduction. No pain with  forward elevation. Hawkins sign was negative. The cross chest abduction test was negative. She had some pain in abduction external rotation. She had pain with resisted forward elevation.  Motor function was grade 5.     Impression & Recommendations:  Problem # 1:  IMPINGEMENT SYNDROME (ICD-726.2) Assessment Deteriorated MRI: 1.  Moderate focal tendinopathy/tendinosis of the infraspinatus supraspinatus junction region.  No discrete partial or full- thickness rotator cuff tear. 2.  No significant findings for bony impingement.  Mild AC joint degenerative changes and minimal lateral downsloping of the acromion. 3.  Intact biceps tendon and glenoid labra. 4.  Mild subacromial/subdeltoid bursitis.   I have recommended SARS for decompression due to failure to improve.   Informed consent process: I have discussed the procedure with the patient. I have answered their questions. The risks of bleeding, infection, nerve and vascualr injury have been discussed. The diagnosis and reason for surgery have been explained. The patient demonstrates understanding of this discussion. Specific to this procedure risks include:  pain/stiffness   Orders: Est. Patient Level IV (88416)  Patient Instructions: 1)  DOS 10/29/09  2)  PREOP 10/27/09 now, take packet with you to [facility] short stay center. 3)  POSTOP in our office 11/02/09. 4)    5)

## 2010-11-08 NOTE — Miscellaneous (Signed)
Summary: OT Progress note  OT Progress note   Imported By: Jacklynn Ganong 12/07/2009 14:11:51  _____________________________________________________________________  External Attachment:    Type:   Image     Comment:   External Document

## 2010-11-08 NOTE — Progress Notes (Signed)
Summary: pt in pain  Phone Note Call from Patient   Summary of Call: pt would like to get a referral to physical therpy. still in pain. [phone] Initial call taken by: Rudene Anda,  May 19, 2010 2:23 PM  Follow-up for Phone Call        Physical therapy referral [facility], per evaluation.  2-3 days / week x 2 weeks. Dx:  Lt Sciatica Follow-up by: Esperanza Sheets PA,  May 20, 2010 12:07 PM  Additional Follow-up for Phone Call Additional follow up Details #1::        pt has been referred over to [facility] for physical therpy. pt notified  Additional Follow-up by: Rudene Anda,  May 20, 2010 1:31 PM

## 2010-11-08 NOTE — Miscellaneous (Signed)
Summary: Rehab Report  Rehab Report   Imported By: Lind Guest 05/30/2010 14:24:59  _____________________________________________________________________  External Attachment:    Type:   Image     Comment:   External Document

## 2010-11-08 NOTE — Assessment & Plan Note (Signed)
Summary: POST OP 1/SHOULDER SURG 10/29/09/BCBS/CAF   Visit Type:  Follow-up Primary Provider:  Franchot Heidelberg, MD  CC:  right shoulder postop.  History of Present Illness: I saw Elynore Langworthy in the office today for a followup visit.  She is a 55 years old woman with the complaint of:  RIGHT SHOULDER.  DOS   10-29-09.  Procedure   Arthroscopy right shoulder, subacromial decompression.  Medication:  Robaxin, Lorcet plus and Ibuprofen 800mg , takes Ibuprofen only.  Subjectives: i M FEELING BETTER THAN I DID BEFORE SURGERY   THE WOUNDS LOOK GOOD A LITTLE BRUISING   NEURO-VASCULAR INTACT   Assessment:  IMPROVED !  PLAN: F/U 4 WEEKS       Allergies: No Known Drug Allergies   Other Orders: Post-Op Check (29562)  Patient Instructions: 1)  Start Physical Therapy return in 1 month

## 2010-11-08 NOTE — Progress Notes (Signed)
Summary: can we extend RTW note until after next appointment?  Phone Note Call from Patient   Summary of Call: Teresa Soto (03/10/1956) says the therapy is not helping her shoulder, in fact it is making it worse,  Wants to know what you advise. Her # [phone] Initial call taken by: Jacklynn Ganong,  October 18, 2009 1:48 PM  Follow-up for Phone Call        stop therapy  Follow-up by: Fuller Canada MD,  October 18, 2009 1:50 PM  Additional Follow-up for Phone Call Additional follow up Details #1::        Advised Patient to stop therapy. She is scheduled to come back 10/27/09 for recheck.  Her original RTW date is 10/23/09, wants to know if she can remain out until after her 10/27/09 recheck appointment. Additional Follow-up by: Jacklynn Ganong,  October 20, 2009 8:52 AM    Additional Follow-up for Phone Call Additional follow up Details #2::    advise to return to work as scheduled  Follow-up by: Fuller Canada MD,  October 20, 2009 9:46 AM  Additional Follow-up for Phone Call Additional follow up Details #3:: Details for Additional Follow-up Action Taken: advised patient to return to work as scheduled Additional Follow-up by: Jacklynn Ganong,  October 20, 2009 4:33 PM

## 2010-11-08 NOTE — Assessment & Plan Note (Signed)
Summary: 4 WK RE-CK/RT SHOULDER/POST OP 10/29/09/[employer]BS/CAF   Visit Type:  post op Primary Provider:  Syliva Overman MD  CC:  right shoulder pain.  History of Present Illness: I saw Teresa Soto in the office today for a 4 week followup visit.  She is a 55 years old woman with the complaint of:  right shoulder.  DOS   10-29-09.  Procedure   Arthroscopy right shoulder, subacromial decompression.  Medication:  Ibuprofen 800mg   Rheumatologist: rec 300 mg neurontin for Fibromyalgia   She is doing well improved range of motion less pain.  Still can't cup of tea.  She has some knee pain.  She has full forward elevation, full abduction, excellent strength. No impingement signs.  Return 3 months after completing her home exercise program, with therabands  Allergies: No Known Drug Allergies   Impression & Recommendations:  Problem # 1:  IMPINGEMENT SYNDROME (ICD-726.2) Assessment Improved  Orders: Post-Op Check (51884)  Patient Instructions: 1)  Home exercises return in 3 months

## 2010-11-08 NOTE — Letter (Signed)
Summary: Out of Work  [facility]  [address]  [place], [place] [address]   Phone: [phone]  Fax: [phone]    November 02, 2009   Employee:  ANTONIA JICHA    To Whom It May Concern:   For Medical reasons, please excuse the above named employee from work for the following dates:   Start:   October 23, 2009  (continue out of work status, secondary to surgery)  End/Estimated Return to work:   January 31, 2010   If you need additional information, please feel free to contact our office.         Sincerely,    Terrance Mass, MD

## 2010-11-08 NOTE — Progress Notes (Signed)
Summary: wants new Neurontin prescription  Phone Note Call from Patient   Summary of Call: Teresa Soto (05/05/1956) request Dr. Romeo Apple write a new precription for Neurontin with 3 X a day instructions called to [facility] in [place]. Please advise. Her # is [phone] Initial call taken by: Jacklynn Ganong,  Feb 23, 2010 12:10 PM  Follow-up for Phone Call        proceed Follow-up by: Fuller Canada MD,  Feb 23, 2010 12:51 PM    Prescriptions: NEURONTIN 100 MG CAPS (GABAPENTIN) 1 by mouth at bedtime  #60 x 5   Entered by:   Ether Griffins   Authorized by:   Fuller Canada MD   Signed by:   Ether Griffins on 02/23/2010   Method used:   Faxed to ...       [facility] (retail)       [address]       [place], [place]  [address]       Ph: [phone]       Fax: [phone]   RxID:   [id number]

## 2010-11-08 NOTE — Letter (Signed)
Summary: Surgery order RTshoulder  Surgery order RTshoulder   Imported By: Cammie Sickle 01/29/2010 12:19:23  _____________________________________________________________________  External Attachment:    Type:   Image     Comment:   External Document

## 2010-11-10 NOTE — Progress Notes (Signed)
  Phone Note From Pharmacy   Caller: [facility]  [address] Summary of Call: REQUESTING REFILL ON CELBREX 200MG  ONE CAP by mouth once daily OKAY TO REFILL? NOT ON MED LIST Initial call taken by: Adella Hare LPN,  October 06, 2010 10:56 AM  Follow-up for Phone Call        deny, not on med list, needs appt scheduled since a new med and last here in sept Follow-up by: Syliva Overman MD,  October 06, 2010 12:07 PM

## 2010-11-10 NOTE — Progress Notes (Signed)
  Phone Note Call from Patient   Summary of Call: Patient requesting celebrex. Not on medlist.  Initial call taken by: Everitt Amber LPN,  October 27, 2010 10:23 AM  Follow-up for Phone Call        plslet her know and send in script entered historically, pls also remind her that all anti-inflammatories inc celebrex inc the rik of kidney and heart disease, so lowest dose for shortest period recommended, also inc risk of bleeding in thee stomach  aLSO pls add to the note, where she is hurting and for how long why she needs the med Follow-up by: Syliva Overman MD,  October 27, 2010 12:05 PM  Additional Follow-up for Phone Call Additional follow up Details #1::        Told patient to schedule app to go over concerns with dr. She should have came back in Oct for follow up Additional Follow-up by: Everitt Amber LPN,  October 27, 2010 12:46 PM    New/Updated Medications: CELEBREX 200 MG CAPS (CELECOXIB) Take 1 tablet by mouth once a day as needed for uncontrolled joint pain Prescriptions: CELEBREX 200 MG CAPS (CELECOXIB) Take 1 tablet by mouth once a day as needed for uncontrolled joint pain  #30 x 0   Entered and Authorized by:   Syliva Overman MD   Signed by:   Syliva Overman MD on 10/27/2010   Method used:   Historical   RxID:   [id number]

## 2010-11-10 NOTE — Letter (Signed)
Summary: performance spine  performance spine   Imported By: Lind Guest 11/02/2010 17:25:29  _____________________________________________________________________  External Attachment:    Type:   Image     Comment:   External Document

## 2010-11-10 NOTE — Letter (Signed)
Summary: PERFORMANCE SPINE  PERFORMANCE SPINE   Imported By: Lind Guest 11/02/2010 14:13:57  _____________________________________________________________________  External Attachment:    Type:   Image     Comment:   External Document

## 2010-11-10 NOTE — Letter (Signed)
Summary: performance SPINE  performance SPINE   Imported By: Lind Guest 11/02/2010 15:23:40  _____________________________________________________________________  External Attachment:    Type:   Image     Comment:   External Document

## 2010-11-10 NOTE — Letter (Signed)
Summary: performance spine  performance spine   Imported By: Lind Guest 11/02/2010 17:25:55  _____________________________________________________________________  External Attachment:    Type:   Image     Comment:   External Document

## 2010-11-10 NOTE — Assessment & Plan Note (Signed)
Summary: F UP MEDS   Vital Signs:  Patient profile:   55 year old female Menstrual status:  postmenopausal Height:      62.5 inches Weight:      163 pounds BMI:     29.44 O2 Sat:      98 % Pulse rate:   101 / minute Pulse rhythm:   regular Resp:     16 per minute BP sitting:   120 / 78  (left arm) Cuff size:   regular  Vitals Entered By: Everitt Amber LPN (October 31, 2010 10:49 AM)  Nutrition Counseling: Patient's BMI is greater than 25 and therefore counseled on weight management options. 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  History of Present Illness: Back pain with bulging disc in spine first dx in Nov , nad has been seeing pain specialist since. Reports i;ittle relief, lasts about 2 weeks, after 2 epidurals, reports unchanged numbness in left thigh and weak left knee. She has been unable to work since June. She has concerns about her weight, she is unable to exercise,and is pleasantly surprised that she has not gained weight. She understands the need to be more diligent with regardto caloric intake.  Denies recent fever or chills. Denies sinus pressure, nasal congestion , ear pain or sore throat. Denies chest congestion, or cough productive of sputum.No recent asthma flares Denies chest pain, palpitations, PND, orthopnea or leg swelling. Denies abdominal pain, nausea, vomitting, diarrhea or constipation.  Denies change in bowel movements or bloody stool. Denies dysuria , frequency, incontinence or hesitancy.  Denies headaches, vertigo, seizures. Denies depression, anxiety or insomnia. Denies  rash, lesions, or itch.     Current Medications (verified): 1)  Lisinopril 20 Mg  Tabs (Lisinopril) .... One Daily 2)  Proair Hfa 108 (90 Base) Mcg/act  Aers (Albuterol Sulfate)  .... 2 Puffs Every 6 Hours As Needed 3)  Symbicort 160-4.5 Mcg/act  Aero (Budesonide-Formoterol Fumarate) .... One Puff Two Times A Day 4)  Mirapex 0.25 Mg Tabs (Pramipexole Dihydrochloride) .... At Bedtime 5)  Left Wrist Carpal Tunnel Brace .... As Directed 6)  Zyrtec Allergy 10 Mg Tabs (Cetirizine Hcl) .... Take 1 Tablet By Mouth Once A Day 7)  Fluticasone Propionate 50 Mcg/act Susp (Fluticasone Propionate) .... Use 2 Sprays Each Nostril Once Daily 8)  Lexapro 20 Mg Tabs (Escitalopram Oxalate) .... Take 1 Tablet By Mouth Once A Day 9)  Neurontin 100 Mg Caps (Gabapentin) .Marland Kitchen.. 1 By Mouth Q 4 Hrs As Needed Pain 10)  Celebrex 200 Mg Caps (Celecoxib) .... Take 1 Tablet By Mouth Once A Day As Needed For Uncontrolled Joint Pain 11)  Lyrica 50 Mg Caps (Pregabalin) .... Take 1 Tablet By Mouth Two Times A Day 12)  Amitriptyline Hcl 25 Mg Tabs (Amitriptyline Hcl) .... Take 1 Tab By Mouth At Bedtime  Allergies (verified): No Known Drug Allergies  Social History: Occupation: unemploiyed since June 2011 due to back pain Married  x 24 yrs; resides locally; 4 children, one of whom continues to live at home Alcohol use-no Drug use-no Education: 12 th grade Former Smoker quit at 26 after 2 pack year consumption   Review of Systems      See HPI General:  Complains of fatigue and sleep disorder; chronic uncontrolled back pain. Eyes:  Denies  discharge and red eye. Psych:  Complains of anxiety, depression, and irritability; denies mental problems, suicidal thoughts/plans, thoughts of violence, and unusual visions or sounds; concerned about disablingback pain. Endo:  Denies cold intolerance, excessive hunger, excessive thirst, and excessive urination. Heme:  Denies abnormal bruising and bleeding. Allergy:  Complains of seasonal allergies.  Physical Exam  General:  Well-developed,well-nourished,in no acute distress; alert,appropriate and cooperative throughout examination HEENT: No facial asymmetry,    EOMI, No sinus tenderness, TM's Clear, oropharynx  pink and moist.   Chest: Clear to auscultation bilaterally.  CVS: S1, S2, No murmurs, No S3.   Abd: Soft, Nontender.  MS: decreased ROM spine adequate in  hips, shoulders and knees.  Ext: No edema.   CNS: CN 2-12 intact, power tone and sensation normal throughout.   Skin: Intact, no visible lesions or rashes.  Psych: Good eye contact, normal affect.  Memory intact, anxious not  depressed appearing.     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 Take 1 tab every 8 hrs as needed for muscle spasm    Tramadol Hcl 50 Mg Tabs (Tramadol hcl) .Marland Kitchen... Take 1-2 every 6 hrs as needed for pain Her updated medication list for this problem includes:    Celebrex 200 Mg Caps (Celecoxib) .Marland Kitchen... Take 1 tablet by mouth once a day as needed for uncontrolled joint pain requesting celebrex and may be considering surgical intervention, will d/w pain specialist treating  her  Problem # 2:  DEPRESSION (ICD-311) Assessment: Unchanged  Her updated medication list for this problem includes:    Lexapro 20 Mg Tabs (Escitalopram oxalate) .Marland Kitchen... Take 1 tablet by mouth once a day    Amitriptyline Hcl 25 Mg Tabs (Amitriptyline hcl) .Marland Kitchen... Take 1 tab by mouth at bedtime  Problem # 3:  HYPERTENSION (ICD-401.9) Assessment: Unchanged  Her updated medication list for this problem includes:    Lisinopril 20 Mg Tabs (Lisinopril) ..... One daily  Orders: T-Basic Metabolic Panel [phone])  BP today: 120/78 Prior BP: 110/80 (06/28/2010)  Prior 10 Yr Risk Heart Disease: 7 % (06/08/2009)  Labs Reviewed: K+: 4.6 (12/03/2008) Creat: : 0.77 (12/03/2008)   Chol: 239 (12/03/2008)   HDL: 48 (12/03/2008)   LDL: 163 (12/03/2008)   TG: 138 (12/03/2008)  Problem # 4:   HYPERLIPIDEMIA (ICD-272.4) Assessment: Comment Only  Orders: T-Lipid Profile ([id number]),past due Low fat dietdiscussed and encouraged   Labs Reviewed: SGOT: 9 (12/03/2008)   SGPT: 8 (12/03/2008)  Lipid Goals: Chol Goal: 200 (03/16/2009)   HDL Goal: 40 (03/16/2009)   LDL Goal: 160 (03/16/2009)   TG Goal: 150 (03/16/2009)  Prior 10 Yr Risk Heart Disease: 7 % (06/08/2009)   HDL:48 (12/03/2008), 48 (12/03/2008)  LDL:163 (12/03/2008), 163 (12/03/2008)  Chol:239 (12/03/2008), 239 (12/03/2008)  Trig:138 (12/03/2008), 138 (12/03/2008)  Complete Medication List: 1)  Lisinopril 20 Mg Tabs (Lisinopril) .... One daily 2)  Proair Hfa 108 (90 Base) Mcg/act Aers (Albuterol sulfate) .... 2 puffs every 6 hours as needed 3)  Symbicort 160-4.5 Mcg/act Aero (Budesonide-formoterol fumarate) .... One puff two times a day 4)  Mirapex 0.25 Mg Tabs (Pramipexole dihydrochloride) .... At bedtime 5)  Left Wrist Carpal Tunnel Brace  .... As directed 6)  Zyrtec Allergy 10 Mg Tabs (Cetirizine hcl) .... Take 1 tablet by mouth once a day 7)  Fluticasone Propionate 50 Mcg/act Susp (Fluticasone  propionate) .... Use 2 sprays each nostril once daily 8)  Lexapro 20 Mg Tabs (Escitalopram oxalate) .... Take 1 tablet by mouth once a day 9)  Neurontin 100 Mg Caps (Gabapentin) .Marland Kitchen.. 1 by mouth q 4 hrs as needed pain 10)  Celebrex 200 Mg Caps (Celecoxib) .... Take 1 tablet by mouth once a day as needed for uncontrolled joint pain 11)  Lyrica 50 Mg Caps (Pregabalin) .... Take 1 tablet by mouth two times a day 12)  Amitriptyline Hcl 25 Mg Tabs (Amitriptyline hcl) .... Take 1 tab by mouth at bedtime  Other Orders: T-TSH ([id number]) T-CBC w/Diff [phone]) T-Vitamin D (25-Hydroxy) [phone])  Patient Instructions: 1)  CPE end June to early July. 2)  It is important that you exercise regularly at least 20 minutes 5 times a week. If you develop chest pain, have severe difficulty breathing, or feel very tired , stop  exercising immediately and seek medical attention. 3)  You need to lose weight. Consider a lower calorie diet and regular exercise. Goal is 6 pounds in the next 6 months  4)  BMP prior to visit, ICD-9: 5)  Lipid Panel prior to visit, ICD-9:  fasting asap, but wait until 4 weeks after your next steroid injection pls 6)  TSH prior to visit, ICD-9: 7)  CBC w/ Diff prior to visit, ICD-9: 8)  Vit D 9)  Schedule your mammogram. 10)  Pls call if you need any help . Prescriptions: CELEBREX 200 MG CAPS (CELECOXIB) Take 1 tablet by mouth once a day as needed for uncontrolled joint pain  #30 x 0   Entered by:   Everitt Amber LPN   Authorized by:   Syliva Overman MD   Signed by:   Everitt Amber LPN on 57/84/6962   Method used:   Electronically to        [facility]  [address] (retail)       [address]       [place], [place]  [address]       Ph: [phone]       Fax: [phone]   RxID:   [id number] MIRAPEX 0.25 MG TABS (PRAMIPEXOLE DIHYDROCHLORIDE) at bedtime  #30 Each x 2   Entered by:   Everitt Amber LPN   Authorized by:   Syliva Overman MD   Signed by:   Everitt Amber LPN on 95/18/8416   Method used:   Electronically to        [facility]  [address] (retail)       [address]       [place], [place]  [address]       Ph: [phone]       Fax: [phone]   RxID:   [id number] LISINOPRIL 20 MG  TABS (LISINOPRIL) One daily  #30 Each x 3   Entered by:   Everitt Amber LPN   Authorized by:   Syliva Overman MD   Signed by:   Everitt Amber LPN on 60/73/7106   Method used:   Electronically to        [facility]  [address] (retail)       [address]       [place], [place]  [address]       Ph: [phone]       Fax: [phone]   RxID:   [id number]  Orders Added: 1)  Est. Patient Level IV [[address]657] 2)  T-Basic Metabolic Panel [80048-22910] 3)  T-Lipid Profile [80061-22930] 4)  T-TSH [84696-29528] 5)  T-CBC w/Diff  [41324-40102] 6)  T-Vitamin D (25-Hydroxy) [72536-64403]

## 2010-11-10 NOTE — Letter (Signed)
Summary: performance spine  performance spine   Imported By: Lind Guest 11/02/2010 17:26:21  _____________________________________________________________________  External Attachment:    Type:   Image     Comment:   External Document

## 2010-12-08 HISTORY — PX: SPINE SURGERY: SHX786

## 2010-12-15 ENCOUNTER — Other Ambulatory Visit (HOSPITAL_COMMUNITY): Payer: Self-pay | Admitting: Neurosurgery

## 2010-12-15 DIAGNOSIS — M5126 Other intervertebral disc displacement, lumbar region: Secondary | ICD-10-CM

## 2010-12-16 ENCOUNTER — Ambulatory Visit (HOSPITAL_COMMUNITY)
Admission: RE | Admit: 2010-12-16 | Discharge: 2010-12-16 | Disposition: A | Discharge status: Home / Self Care | Payer: BC Managed Care – PPO | Source: Ambulatory Visit | Attending: Neurosurgery | Admitting: Neurosurgery

## 2010-12-16 DIAGNOSIS — M5126 Other intervertebral disc displacement, lumbar region: Secondary | ICD-10-CM | POA: Insufficient documentation

## 2010-12-16 DIAGNOSIS — M545 Low back pain, unspecified: Secondary | ICD-10-CM | POA: Insufficient documentation

## 2010-12-25 LAB — HEMOGLOBIN AND HEMATOCRIT, BLOOD
HCT: 40.2 % (ref 36.0–46.0)
Hemoglobin: 13.7 g/dL (ref 12.0–15.0)

## 2010-12-25 LAB — URINALYSIS, ROUTINE W REFLEX MICROSCOPIC
Nitrite: NEGATIVE
Protein, ur: NEGATIVE mg/dL
Specific Gravity, Urine: 1.02 (ref 1.005–1.030)
Urobilinogen, UA: 0.2 mg/dL (ref 0.0–1.0)

## 2010-12-25 LAB — BASIC METABOLIC PANEL
Chloride: 103 mEq/L (ref 96–112)
GFR calc Af Amer: >60 mL/min (ref >60)
Potassium: 4.1 mEq/L (ref 3.5–5.1)
Sodium: 138 mEq/L (ref 135–145)

## 2010-12-25 LAB — URINE MICROSCOPIC-ADD ON

## 2010-12-26 LAB — BASIC METABOLIC PANEL
BUN: 14 mg/dL (ref 6–23)
CO2: 23 mEq/L (ref 19–32)
Calcium: 10.6 mg/dL — ABNORMAL HIGH (ref 8.4–10.5)
GFR calc non Af Amer: >60 mL/min (ref >60)
Glucose, Bld: 110 mg/dL — ABNORMAL HIGH (ref 70–99)

## 2010-12-26 LAB — DIFFERENTIAL
Basophils Absolute: 0.1 10*3/uL (ref 0.0–0.1)
Basophils Relative: 1 % (ref 0–1)
Eosinophils Relative: 2 % (ref 0–5)
Lymphocytes Relative: 42 % (ref 12–46)
Monocytes Absolute: 0.4 10*3/uL (ref 0.1–1.0)
Neutro Abs: 3.1 10*3/uL (ref 1.7–7.7)

## 2010-12-26 LAB — CBC
HCT: 38.1 % (ref 36.0–46.0)
MCHC: 34.1 g/dL (ref 30.0–36.0)
Platelets: 302 10*3/uL (ref 150–400)
RDW: 13.5 % (ref 11.5–15.5)

## 2010-12-26 LAB — POCT CARDIAC MARKERS: Troponin i, poc: <0.05 ng/mL (ref 0.00–0.09)

## 2011-01-16 LAB — D-DIMER, QUANTITATIVE: D-Dimer, Quant: 0.22 ug/mL-FEU (ref 0.00–0.48)

## 2011-01-16 LAB — URINALYSIS, ROUTINE W REFLEX MICROSCOPIC
Bilirubin Urine: NEGATIVE
Glucose, UA: NEGATIVE mg/dL
Hgb urine dipstick: NEGATIVE
Specific Gravity, Urine: 1.02 (ref 1.005–1.030)
Urobilinogen, UA: 0.2 mg/dL (ref 0.0–1.0)
pH: 6 (ref 5.0–8.0)

## 2011-01-16 LAB — COMPREHENSIVE METABOLIC PANEL
AST: 27 U/L (ref 0–37)
BUN: 12 mg/dL (ref 6–23)
CO2: 25 mEq/L (ref 19–32)
Calcium: 9.7 mg/dL (ref 8.4–10.5)
Chloride: 111 mEq/L (ref 96–112)
Creatinine, Ser: 0.79 mg/dL (ref 0.4–1.2)
GFR calc Af Amer: >60 mL/min (ref >60)
GFR calc non Af Amer: >60 mL/min (ref >60)
Glucose, Bld: 162 mg/dL — ABNORMAL HIGH (ref 70–99)
Total Bilirubin: 0.3 mg/dL (ref 0.3–1.2)

## 2011-01-16 LAB — DIFFERENTIAL
Basophils Absolute: 0 10*3/uL (ref 0.0–0.1)
Eosinophils Relative: 2 % (ref 0–5)
Lymphocytes Relative: 23 % (ref 12–46)
Lymphs Abs: 1.7 10*3/uL (ref 0.7–4.0)
Neutrophils Relative %: 69 % (ref 43–77)

## 2011-01-16 LAB — POCT CARDIAC MARKERS
CKMB, poc: <1 ng/mL — ABNORMAL LOW (ref 1.0–8.0)
CKMB, poc: <1 ng/mL — ABNORMAL LOW (ref 1.0–8.0)
Troponin i, poc: <0.05 ng/mL (ref 0.00–0.09)
Troponin i, poc: <0.05 ng/mL (ref 0.00–0.09)

## 2011-01-16 LAB — CBC
HCT: 33.8 % — ABNORMAL LOW (ref 36.0–46.0)
Hemoglobin: 12 g/dL (ref 12.0–15.0)
MCHC: 35.5 g/dL (ref 30.0–36.0)
MCV: 87.8 fL (ref 78.0–100.0)
RBC: 3.85 MIL/uL — ABNORMAL LOW (ref 3.87–5.11)
WBC: 7.4 10*3/uL (ref 4.0–10.5)

## 2011-01-20 ENCOUNTER — Encounter: Payer: Self-pay | Admitting: Family Medicine

## 2011-02-22 ENCOUNTER — Other Ambulatory Visit: Payer: Self-pay | Admitting: Family Medicine

## 2011-04-01 ENCOUNTER — Other Ambulatory Visit: Payer: Self-pay | Admitting: Family Medicine

## 2011-04-24 ENCOUNTER — Encounter: Payer: Self-pay | Admitting: Family Medicine

## 2011-04-30 ENCOUNTER — Emergency Department (HOSPITAL_COMMUNITY)
Admission: EM | Admit: 2011-04-30 | Discharge: 2011-05-01 | Disposition: A | Discharge status: Home / Self Care | Payer: BC Managed Care – PPO | Attending: Emergency Medicine | Admitting: Emergency Medicine

## 2011-04-30 ENCOUNTER — Encounter: Payer: Self-pay | Admitting: *Deleted

## 2011-04-30 DIAGNOSIS — H6690 Otitis media, unspecified, unspecified ear: Secondary | ICD-10-CM | POA: Insufficient documentation

## 2011-04-30 DIAGNOSIS — H8110 Benign paroxysmal vertigo, unspecified ear: Secondary | ICD-10-CM | POA: Insufficient documentation

## 2011-04-30 DIAGNOSIS — I10 Essential (primary) hypertension: Secondary | ICD-10-CM | POA: Insufficient documentation

## 2011-04-30 DIAGNOSIS — R1110 Vomiting, unspecified: Secondary | ICD-10-CM | POA: Insufficient documentation

## 2011-04-30 DIAGNOSIS — Z87891 Personal history of nicotine dependence: Secondary | ICD-10-CM | POA: Insufficient documentation

## 2011-04-30 DIAGNOSIS — Z79899 Other long term (current) drug therapy: Secondary | ICD-10-CM | POA: Insufficient documentation

## 2011-04-30 DIAGNOSIS — H6691 Otitis media, unspecified, right ear: Secondary | ICD-10-CM

## 2011-04-30 HISTORY — DX: Neuralgia and neuritis, unspecified: M79.2

## 2011-04-30 HISTORY — DX: Essential (primary) hypertension: I10

## 2011-04-30 HISTORY — DX: Dizziness and giddiness: R42

## 2011-04-30 LAB — DIFFERENTIAL
Lymphs Abs: 2.5 10*3/uL (ref 0.7–4.0)
Monocytes Absolute: 0.7 10*3/uL (ref 0.1–1.0)
Monocytes Relative: 10 % (ref 3–12)
Neutro Abs: 4.1 10*3/uL (ref 1.7–7.7)
Neutrophils Relative %: 55 % (ref 43–77)

## 2011-04-30 LAB — CBC
HCT: 39.1 % (ref 36.0–46.0)
Hemoglobin: 13.2 g/dL (ref 12.0–15.0)
MCH: 28.8 pg (ref 26.0–34.0)
RBC: 4.59 MIL/uL (ref 3.87–5.11)

## 2011-04-30 LAB — BASIC METABOLIC PANEL
BUN: 13 mg/dL (ref 6–23)
Chloride: 103 mEq/L (ref 96–112)
Creatinine, Ser: 0.74 mg/dL (ref 0.50–1.10)
Glucose, Bld: 136 mg/dL — ABNORMAL HIGH (ref 70–99)
Potassium: 3.7 mEq/L (ref 3.5–5.1)

## 2011-04-30 MED ORDER — MECLIZINE HCL 12.5 MG PO TABS
25.0000 mg | ORAL_TABLET | Freq: Once | ORAL | Status: AC
  Administered 2011-04-30: 25 mg via ORAL
  Filled 2011-04-30: qty 2

## 2011-04-30 MED ORDER — SODIUM CHLORIDE 0.9 % IV SOLN
Freq: Once | INTRAVENOUS | Status: AC
  Administered 2011-04-30: 23:00:00 via INTRAVENOUS

## 2011-04-30 MED ORDER — LORAZEPAM 2 MG/ML IJ SOLN
1.0000 mg | Freq: Once | INTRAMUSCULAR | Status: AC
  Administered 2011-04-30: 2 mg via INTRAVENOUS
  Filled 2011-04-30: qty 1

## 2011-04-30 NOTE — ED Notes (Signed)
Pt also c/o right ear pain and severe dizziness

## 2011-04-30 NOTE — ED Notes (Signed)
Pt c/o vomiting and "just not feeling right"

## 2011-05-01 MED ORDER — AMOXICILLIN 500 MG PO CAPS: 500.0000 mg | ORAL_CAPSULE | Freq: Three times a day (TID) | ORAL | Status: DC

## 2011-05-01 MED ORDER — LORAZEPAM 1 MG PO TABS: 1.0000 mg | ORAL_TABLET | Freq: Three times a day (TID) | ORAL | Status: AC | PRN

## 2011-05-01 MED ORDER — MECLIZINE HCL 25 MG PO TABS: 25.0000 mg | ORAL_TABLET | Freq: Four times a day (QID) | ORAL | Status: AC

## 2011-05-01 MED ORDER — MECLIZINE HCL 25 MG PO TABS: 25.0000 mg | ORAL_TABLET | Freq: Four times a day (QID) | ORAL | Status: DC

## 2011-05-01 MED ORDER — LORAZEPAM 1 MG PO TABS: 1.0000 mg | ORAL_TABLET | Freq: Three times a day (TID) | ORAL | Status: DC | PRN

## 2011-05-01 NOTE — ED Provider Notes (Addendum)
History    Patient has history of vertigo. Complains of dizziness tonight approximately one hour ago with a regular pain. Also vomiting. She has had vertigo in the past. Also history of hypertension and L1-2 back surgery there is no stiff neck neurological deficits or any signs of a CVA. This feels like her similar vertiginous episodes in the past. No remitting or exacerbating factors. Chief Complaint  Patient presents with  . Emesis   Patient is a 55 y.o. female presenting with vomiting. The history is provided by the patient. History Limited By: Level V caveat urgent need for intervention.  Emesis     Past Medical History  Diagnosis Date  . Vertigo   . Hypertension   . Asthma   . Neuropathic pain     Past Surgical History  Procedure Date  . Back surgery   . Tonsillectomy   . Cesarean section     x 2  . Shoulder arthroscopy w/ rotator cuff repair     to right shoulder  . Tubal ligation     History reviewed. No pertinent family history.  History  Substance Use Topics  . Smoking status: Former Games developer  . Smokeless tobacco: Not on file  . Alcohol Use: No    OB History    Grav Para Term Preterm Abortions TAB SAB Ect Mult Living                  Review of Systems  Gastrointestinal: Positive for vomiting.  All other systems reviewed and are negative.    Physical Exam  BP 106/80  Pulse 103  Temp(Src) 98.2 F (36.8 C) (Oral)  Resp 17  Ht 5\' 1"  (1.549 m)  Wt 163 lb (73.936 kg)  BMI 30.80 kg/m2  SpO2 98%  Physical Exam  Nursing note and vitals reviewed. Constitutional: She is oriented to person, place, and time. She appears well-developed and well-nourished.  HENT:  Head: Normocephalic and atraumatic.       Right tympanic membrane slightly erythematous.  Eyes: Conjunctivae and EOM are normal. Pupils are equal, round, and reactive to light.  Neck: Normal range of motion. Neck supple.  Cardiovascular: Normal rate and regular rhythm.   Pulmonary/Chest:  Effort normal and breath sounds normal.  Abdominal: Soft. Bowel sounds are normal.  Musculoskeletal: Normal range of motion.  Neurological: She is alert and oriented to person, place, and time.  Skin: Skin is warm and dry.  Psychiatric: She has a normal mood and affect.    ED Course  Procedures  MDM Patient and husband both feel this is similar to her vertigo episodes in the past. She does complain of right ear pain and this may be the inciting event. Will Rx IV fluids in the emergency department along with IV and Ativan and by mouth Antivert. Patient rechecked prior to discharge and is feeling much better. She is ambulatory without neurological deficits. We'll discharge home on Antivert, Ativan, and amoxicillin for a suspected ear infection. Patient and husband agree.     Donnetta Hutching, MD 05/01/11 6045  Donnetta Hutching, MD 05/01/11 [phone]

## 2011-05-01 NOTE — ED Notes (Signed)
Staff member assisting pt on bed pan

## 2011-05-01 NOTE — ED Notes (Signed)
Patient still c/o dizziness but not as severe as before

## 2011-05-02 ENCOUNTER — Ambulatory Visit: Payer: Self-pay | Admitting: Family Medicine

## 2011-05-02 ENCOUNTER — Encounter: Payer: Self-pay | Admitting: Family Medicine

## 2011-05-15 ENCOUNTER — Other Ambulatory Visit: Payer: Self-pay | Admitting: Family Medicine

## 2011-06-07 ENCOUNTER — Other Ambulatory Visit: Payer: Self-pay | Admitting: Family Medicine

## 2011-06-19 ENCOUNTER — Telehealth: Payer: Self-pay | Admitting: Family Medicine

## 2011-06-19 MED ORDER — ESCITALOPRAM OXALATE 20 MG PO TABS: 20.0000 mg | ORAL_TABLET | Freq: Every day | ORAL | Status: DC

## 2011-06-19 MED ORDER — PRAMIPEXOLE DIHYDROCHLORIDE 0.25 MG PO TABS: ORAL_TABLET | ORAL | Status: DC

## 2011-06-19 NOTE — Telephone Encounter (Signed)
Only a 30 day supply. Should have had CPE in June. Needs appt

## 2011-06-21 ENCOUNTER — Telehealth: Payer: Self-pay | Admitting: Family Medicine

## 2011-06-21 MED ORDER — ESCITALOPRAM OXALATE 20 MG PO TABS: 20.0000 mg | ORAL_TABLET | Freq: Every day | ORAL | Status: DC

## 2011-06-21 NOTE — Telephone Encounter (Signed)
Needs generic of the lexapro please send in

## 2011-06-21 NOTE — Telephone Encounter (Signed)
Resent for 1 month  and patient made earlier appt

## 2011-06-28 ENCOUNTER — Encounter: Payer: Self-pay | Admitting: Family Medicine

## 2011-06-29 ENCOUNTER — Other Ambulatory Visit (HOSPITAL_COMMUNITY)
Admission: RE | Admit: 2011-06-29 | Discharge: 2011-06-29 | Disposition: A | Discharge status: Home / Self Care | Payer: BC Managed Care – PPO | Source: Ambulatory Visit | Attending: Family Medicine | Admitting: Family Medicine

## 2011-06-29 ENCOUNTER — Ambulatory Visit (INDEPENDENT_AMBULATORY_CARE_PROVIDER_SITE_OTHER): Payer: BC Managed Care – PPO | Admitting: Family Medicine

## 2011-06-29 ENCOUNTER — Encounter: Payer: Self-pay | Admitting: Family Medicine

## 2011-06-29 VITALS — BP 140/90 | HR 102 | Resp 16 | Ht 61.0 in | Wt 169.1 lb

## 2011-06-29 DIAGNOSIS — R5381 Other malaise: Secondary | ICD-10-CM

## 2011-06-29 DIAGNOSIS — R7301 Impaired fasting glucose: Secondary | ICD-10-CM

## 2011-06-29 DIAGNOSIS — E785 Hyperlipidemia, unspecified: Secondary | ICD-10-CM

## 2011-06-29 DIAGNOSIS — H9190 Unspecified hearing loss, unspecified ear: Secondary | ICD-10-CM | POA: Insufficient documentation

## 2011-06-29 DIAGNOSIS — IMO0002 Reserved for concepts with insufficient information to code with codable children: Secondary | ICD-10-CM

## 2011-06-29 DIAGNOSIS — H9319 Tinnitus, unspecified ear: Secondary | ICD-10-CM

## 2011-06-29 DIAGNOSIS — Z0000 Encounter for general adult medical examination without abnormal findings: Secondary | ICD-10-CM

## 2011-06-29 DIAGNOSIS — R0989 Other specified symptoms and signs involving the circulatory and respiratory systems: Secondary | ICD-10-CM

## 2011-06-29 DIAGNOSIS — Z1211 Encounter for screening for malignant neoplasm of colon: Secondary | ICD-10-CM

## 2011-06-29 DIAGNOSIS — E559 Vitamin D deficiency, unspecified: Secondary | ICD-10-CM

## 2011-06-29 DIAGNOSIS — Z01419 Encounter for gynecological examination (general) (routine) without abnormal findings: Secondary | ICD-10-CM | POA: Insufficient documentation

## 2011-06-29 DIAGNOSIS — I10 Essential (primary) hypertension: Secondary | ICD-10-CM

## 2011-06-29 DIAGNOSIS — R5383 Other fatigue: Secondary | ICD-10-CM

## 2011-06-29 LAB — HEMOCCULT GUIAC POC 1CARD (OFFICE)

## 2011-06-29 MED ORDER — LISINOPRIL 20 MG PO TABS: ORAL_TABLET | ORAL | Status: DC

## 2011-06-29 MED ORDER — BUDESONIDE-FORMOTEROL FUMARATE 160-4.5 MCG/ACT IN AERO: 1.0000 | INHALATION_SPRAY | Freq: Two times a day (BID) | RESPIRATORY_TRACT | Status: DC

## 2011-06-29 NOTE — Patient Instructions (Signed)
F/U in mid January.  Your blood pressure is high today, please take medication faithfully every day.  You need fasting labs tomorrow, water only for 12 hours before blood test.  Your inhalers will be sent to your pharmacy.   You will be referred for a scan of the arteries in your neck.  You will be referred to ENT to evaluate hearing and abnormal 'Humming" in the right ear.  You are being referred for a colonoscopy, which is past due

## 2011-07-03 ENCOUNTER — Ambulatory Visit (HOSPITAL_COMMUNITY): Payer: BC Managed Care – PPO

## 2011-07-04 ENCOUNTER — Encounter: Payer: Self-pay | Admitting: *Deleted

## 2011-07-04 ENCOUNTER — Telehealth: Payer: Self-pay | Admitting: *Deleted

## 2011-07-04 NOTE — Telephone Encounter (Signed)
Patient is aware pap is normal.

## 2011-07-04 NOTE — Telephone Encounter (Signed)
Message copied by Diamantina Monks on Tue Jul 04, 2011  5:00 PM ------      Message from: Syliva Overman MD E      Created: Tue Jul 04, 2011  4:39 PM       Please advise the patient that pap is normal.

## 2011-07-08 ENCOUNTER — Encounter: Payer: Self-pay | Admitting: Family Medicine

## 2011-07-08 NOTE — Assessment & Plan Note (Signed)
rept labs are past due, low fat diet discussed and encouraged

## 2011-07-08 NOTE — Assessment & Plan Note (Signed)
Uncontrolled. Medication compliance addressed. Commitment to regular exercise, and healthy  eating habits with portion control discussed. DASH diet, and low fat diet discussed, and literature offered. No changes in medication at this time.  

## 2011-07-08 NOTE — Assessment & Plan Note (Signed)
Marked improvement since surgery

## 2011-07-08 NOTE — Progress Notes (Signed)
  Subjective:    Patient ID: Teresa Soto, female    DOB: 08-13-1956, 55 y.o.   MRN: [id number]  HPI The PT is here for annual exam and re-evaluation of chronic medical conditions, medication management and review of any available recent lab and radiology data.  Preventive health is updated, specifically  Cancer screening and Immunization.   Since her last visit, she has had back surgery which she states has alleviated her pain. The PT denies any adverse reactions to current medications since the last visit.  She unfortunately has not taken her blood pressure medication for the past week, and is advised of the need to take this daily. Several concerns voiced.  Concerned about hearing loss, a well as a humming in one ear.   Review of Systems Denies recent fever or chills. Denies sinus pressure, nasal congestion, ear pain or sore throat. Denies chest congestion, productive cough or wheezing. Denies chest pains, palpitations and leg swelling Denies abdominal pain, nausea, vomiting,diarrhea or constipation.   Denies dysuria, frequency, hesitancy or incontinence.  Denies headaches, seizures, numbness, or tingling. Denies uncontrolled  depression, anxiety or insomnia. Denies skin break down or rash.        Objective:   Physical Exam  Pleasant well nourished female, alert and oriented x 3, in no cardio-pulmonary distress. Afebrile. HEENT No facial trauma or asymetry. Sinuses non tender.  EOMI, PERTL, fundoscopic exam is normal, no hemorhage or exudate.  External ears normal, tympanic membranes clear. Oropharynx moist, no exudate, fair dentition. Neck: supple, no adenopathy,JVD or thyromegaly.Bilateral bruits.  Chest: Clear to ascultation bilaterally.No crackles or wheezes. Non tender to palpation  Breast: No asymetry,no masses. No nipple discharge or inversion. No axillary or supraclavicular adenopathy  Cardiovascular system; Heart sounds normal,  S1 and  S2 ,no S3.  No  murmur, or thrill. Apical beat not displaced Peripheral pulses normal.  Abdomen: Soft, non tender, no organomegaly or masses. No bruits. Bowel sounds normal. No guarding, tenderness or rebound.  Rectal:  No mass. Guaiac negative stool.  GU: External genitalia normal. No lesions. Vaginal canal normal.No discharge. Uterus normal size, no adnexal masses, no cervical motion or adnexal tenderness.  Musculoskeletal exam: Full ROM of spine, hips , shoulders and knees. No deformity ,swelling or crepitus noted. No muscle wasting or atrophy.   Neurologic: Cranial nerves 2 to 12 intact. Power, tone ,sensation and reflexes normal throughout. No disturbance in gait. No tremor.  Skin: Intact, no ulceration, erythema , scaling or rash noted. Pigmentation normal throughout  Psych; Normal mood and affect. Judgement and concentration normal       Assessment & Plan:

## 2011-07-08 NOTE — Assessment & Plan Note (Signed)
Reports hearing loss, needs to turn up the tv to hear, will refer for formal testing

## 2011-07-08 NOTE — Assessment & Plan Note (Signed)
Reports continued symptoms and possible hearing loss, will refer to ENT for further eval

## 2011-07-08 NOTE — Assessment & Plan Note (Signed)
Will refer for carotid doppler study

## 2011-07-10 ENCOUNTER — Ambulatory Visit: Payer: BC Managed Care – PPO | Admitting: Gastroenterology

## 2011-07-20 ENCOUNTER — Ambulatory Visit (INDEPENDENT_AMBULATORY_CARE_PROVIDER_SITE_OTHER): Payer: BC Managed Care – PPO | Admitting: Otolaryngology

## 2011-07-24 ENCOUNTER — Other Ambulatory Visit: Payer: Self-pay | Admitting: Family Medicine

## 2011-10-09 ENCOUNTER — Encounter: Payer: Self-pay | Admitting: Family Medicine

## 2011-10-23 ENCOUNTER — Encounter: Payer: Self-pay | Admitting: Family Medicine

## 2011-10-25 ENCOUNTER — Encounter: Payer: Self-pay | Admitting: Family Medicine

## 2011-10-25 ENCOUNTER — Ambulatory Visit: Payer: BC Managed Care – PPO | Admitting: Family Medicine

## 2011-11-01 ENCOUNTER — Ambulatory Visit (INDEPENDENT_AMBULATORY_CARE_PROVIDER_SITE_OTHER): Payer: BC Managed Care – PPO | Admitting: Family Medicine

## 2011-11-01 ENCOUNTER — Encounter: Payer: Self-pay | Admitting: Family Medicine

## 2011-11-01 VITALS — BP 120/88 | HR 88 | Temp 98.9°F | Resp 16 | Ht 61.0 in | Wt 164.8 lb

## 2011-11-01 DIAGNOSIS — R5381 Other malaise: Secondary | ICD-10-CM

## 2011-11-01 DIAGNOSIS — R7309 Other abnormal glucose: Secondary | ICD-10-CM

## 2011-11-01 DIAGNOSIS — F329 Major depressive disorder, single episode, unspecified: Secondary | ICD-10-CM

## 2011-11-01 DIAGNOSIS — R0989 Other specified symptoms and signs involving the circulatory and respiratory systems: Secondary | ICD-10-CM

## 2011-11-01 DIAGNOSIS — R5383 Other fatigue: Secondary | ICD-10-CM

## 2011-11-01 DIAGNOSIS — J45909 Unspecified asthma, uncomplicated: Secondary | ICD-10-CM

## 2011-11-01 DIAGNOSIS — F3289 Other specified depressive episodes: Secondary | ICD-10-CM

## 2011-11-01 DIAGNOSIS — I951 Orthostatic hypotension: Secondary | ICD-10-CM

## 2011-11-01 MED ORDER — ESCITALOPRAM OXALATE 20 MG PO TABS: 20.0000 mg | ORAL_TABLET | Freq: Every day | ORAL | Status: DC

## 2011-11-01 MED ORDER — FLUTICASONE-SALMETEROL 250-50 MCG/DOSE IN AEPB: 1.0000 | INHALATION_SPRAY | Freq: Two times a day (BID) | RESPIRATORY_TRACT | Status: DC

## 2011-11-01 NOTE — Assessment & Plan Note (Signed)
Very vague symptoms, possible early signs of viral illness, noted orthostasis will decrease lisinopril, other differentials- hyperglycemia concern for DM, thyroid disorder. Labs pending. Pt to call with any changing symptoms. F/U in 1 week

## 2011-11-01 NOTE — Assessment & Plan Note (Signed)
Encouraged importance of labs, concern for DM this could be causing her vague symptoms

## 2011-11-01 NOTE — Assessment & Plan Note (Signed)
>>  ASSESSMENT AND PLAN FOR MAJOR DEPRESSION WRITTEN ON 11/01/2011  5:33 PM BY [place], KAWANTA F  Restart Lexapro  at current dose, no apparent SI

## 2011-11-01 NOTE — Progress Notes (Signed)
  Subjective:    Patient ID: Teresa Soto, female    DOB: [birth year], 56 y.o.   MRN: [id number]  HPI   Not feeling well- in general can not pinpoint symptoms, feels light headed,meclizine did not help, denies URI symptoms or GI symptoms, no fever  Asthma-  Not using Symbicort because it causes similar symptoms to how she feels now, has seasonal asthma and recently has been doing well, but would like a different inhaler   Lexapro- ran out of lexapro last Thursday , mood has been okay but needs meds because without she becomes very angry   Pt states she has not gone forth with any preventative measures( Colonscopy, labs) because of cost and she is tired of dealing with new problems, she had enough with her surgery for her shoulder and back. She did not see ENT for her tinnitus, she also does not want to go for the carotid ultrasound      Review of Systems   GEN- +fatigue,demoes  fever, weight loss,weakness, recent illness HEENT- denies eye drainage, change in vision, nasal discharge, CVS- denies chest pain, palpitations RESP- denies SOB, cough, wheeze ABD- denies N/V, change in stools, abd pain GU- denies dysuria, hematuria, dribbling, incontinence MSK- denies joint pain, denies muscle aches, injury Neuro- denies headache, +dizziness, syncope, seizure activity       Objective:   Physical Exam GEN- NAD, alert and oriented x3 HEENT- PERRL, EOMI, non injected sclera, pink conjunctiva, MMM, oropharynx clear, TM clear bilat, fundoscopic exam benign Neck- Supple, no thyromegaly CVS- RRR, no murmur RESP-CTAB ABD- NABS, soft,NT, ND EXT- No edema Pulses- Radial, DP- 2+ Neuro- CNII-XII in tact, no focal deficits Psych- not depressed or overly anxious appearing        Assessment & Plan:

## 2011-11-01 NOTE — Patient Instructions (Addendum)
For your asthma- start the Advair twice a day, use albuterol as needed Return if you have difficulty breathing, fever or new symptoms arise Continue your other medications Please get your labs done- we will  Discuss labs at your recheck When you are ready to proceed with some of the testing and preventative measures recommended please let us know Decrease your blood pressure pill to 1/2 tablet a day for the next week F/U in 1 week for recheck

## 2011-11-01 NOTE — Assessment & Plan Note (Signed)
Mild orthostasis, however pt feels lightheaded, decrease bp med

## 2011-11-01 NOTE — Assessment & Plan Note (Signed)
No bruit heard today, pt does not want to pursue any testing because of cost

## 2011-11-01 NOTE — Assessment & Plan Note (Signed)
D/C symbicort, pt not taking anyway. Trial of Advair

## 2011-11-01 NOTE — Assessment & Plan Note (Signed)
Restart Lexapro at current dose, no apparent SI

## 2011-11-08 LAB — HEMOGLOBIN A1C: Hgb A1c MFr Bld: 6.1 % — ABNORMAL HIGH (ref <5.7)

## 2011-11-08 LAB — BASIC METABOLIC PANEL
BUN: 15 mg/dL (ref 6–23)
CO2: 24 mEq/L (ref 19–32)
Chloride: 107 mEq/L (ref 96–112)
Glucose, Bld: 98 mg/dL (ref 70–99)
Potassium: 4.8 mEq/L (ref 3.5–5.3)

## 2011-11-08 LAB — CBC WITH DIFFERENTIAL/PLATELET
Basophils Absolute: 0 10*3/uL (ref 0.0–0.1)
HCT: 43 % (ref 36.0–46.0)
Hemoglobin: 13.8 g/dL (ref 12.0–15.0)
Lymphocytes Relative: 32 % (ref 12–46)
Lymphs Abs: 1.9 10*3/uL (ref 0.7–4.0)
Monocytes Absolute: 0.4 10*3/uL (ref 0.1–1.0)
Monocytes Relative: 7 % (ref 3–12)
Neutro Abs: 3.5 10*3/uL (ref 1.7–7.7)
RBC: 4.9 MIL/uL (ref 3.87–5.11)
WBC: 6 10*3/uL (ref 4.0–10.5)

## 2011-11-08 LAB — TSH: TSH: 0.488 u[IU]/mL (ref 0.350–4.500)

## 2011-11-08 LAB — LIPID PANEL
LDL Cholesterol: 145 mg/dL — ABNORMAL HIGH (ref 0–99)
Triglycerides: 153 mg/dL — ABNORMAL HIGH (ref <150)
VLDL: 31 mg/dL (ref 0–40)

## 2011-11-09 ENCOUNTER — Encounter: Payer: Self-pay | Admitting: Family Medicine

## 2011-11-09 ENCOUNTER — Ambulatory Visit (INDEPENDENT_AMBULATORY_CARE_PROVIDER_SITE_OTHER): Payer: BC Managed Care – PPO | Admitting: Family Medicine

## 2011-11-09 VITALS — BP 130/76 | HR 94 | Resp 18 | Ht 61.0 in | Wt 167.0 lb

## 2011-11-09 DIAGNOSIS — F3289 Other specified depressive episodes: Secondary | ICD-10-CM

## 2011-11-09 DIAGNOSIS — M129 Arthropathy, unspecified: Secondary | ICD-10-CM

## 2011-11-09 DIAGNOSIS — E785 Hyperlipidemia, unspecified: Secondary | ICD-10-CM

## 2011-11-09 DIAGNOSIS — R5381 Other malaise: Secondary | ICD-10-CM

## 2011-11-09 DIAGNOSIS — J45909 Unspecified asthma, uncomplicated: Secondary | ICD-10-CM

## 2011-11-09 DIAGNOSIS — R5383 Other fatigue: Secondary | ICD-10-CM

## 2011-11-09 DIAGNOSIS — I951 Orthostatic hypotension: Secondary | ICD-10-CM

## 2011-11-09 DIAGNOSIS — E559 Vitamin D deficiency, unspecified: Secondary | ICD-10-CM

## 2011-11-09 DIAGNOSIS — F329 Major depressive disorder, single episode, unspecified: Secondary | ICD-10-CM

## 2011-11-09 MED ORDER — CELECOXIB 200 MG PO CAPS: 200.0000 mg | ORAL_CAPSULE | Freq: Every day | ORAL | Status: DC

## 2011-11-09 MED ORDER — ERGOCALCIFEROL 1.25 MG (50000 UT) PO CAPS: 50000.0000 [IU] | ORAL_CAPSULE | ORAL | Status: DC

## 2011-11-09 NOTE — Patient Instructions (Addendum)
Work on the cholesterol- watch the fat content in your diet, I want your LDL to be less than 130 Start the celebrex  Take the Vitamin D for the next 8 weeks I will check the Parathyroid level - We will call with lab results  F/U in 3 months

## 2011-11-10 DIAGNOSIS — E559 Vitamin D deficiency, unspecified: Secondary | ICD-10-CM | POA: Insufficient documentation

## 2011-11-10 NOTE — Assessment & Plan Note (Signed)
Symptoms resolved, will keep her at 1/2 tablet for now

## 2011-11-10 NOTE — Assessment & Plan Note (Signed)
Pt prefers to use symbicort secondary to cost of advair.

## 2011-11-10 NOTE — Assessment & Plan Note (Signed)
Unchanged, no specific red flags, labs do not show any specific reason,she does have mild hypercalcemia and low Vit D

## 2011-11-10 NOTE — Assessment & Plan Note (Signed)
Discussed importance of diet 

## 2011-11-10 NOTE — Assessment & Plan Note (Signed)
Pt has no signs of malignancy though overdue for Mammogram and Colonscopy Not on diuretic therapy, no exogenous calcium being used Obtain PTH

## 2011-11-10 NOTE — Assessment & Plan Note (Signed)
Overall she feels better back on lexapro, will continue

## 2011-11-10 NOTE — Assessment & Plan Note (Signed)
>>  ASSESSMENT AND PLAN FOR MAJOR DEPRESSION WRITTEN ON 11/10/2011  1:08 PM BY [place], THEODORO F  Overall she feels better back on lexapro , will continue

## 2011-11-10 NOTE — Assessment & Plan Note (Signed)
Restart celebrex 

## 2011-11-10 NOTE — Assessment & Plan Note (Signed)
Replace Vit D

## 2011-11-10 NOTE — Progress Notes (Signed)
  Subjective:    Patient ID: Teresa Soto, female    DOB: 1956/01/14, 56 y.o.   MRN: [id number]  HPI  Pt here to f/u labs and meds Labs reviewed for work-up for fatigue, elevated calcium noted on past labs with decreased Vit D, will need PTH labs done, normal renal function  Asthma- breathing has been okay, she could not afford Advair therefore went back to symbicort and decreased to 1 puff twice a day  Joint pain- history of DJD and OA, was on Celebrex for many years, had to stop for a couple of years but would like to restart  Review of Systems GEN- +fatigue,demoes  fever, weight loss,weakness, recent illness HEENT- denies eye drainage, change in vision, nasal discharge, CVS- denies chest pain, palpitations RESP- denies SOB, cough, wheeze ABD- denies N/V, change in stools, abd pain Neuro- denies headache, dizziness, syncope, seizure activity    Objective:   Physical Exam GEN- NAD, alert and oriented x3 CVS- RRR, no murmur RESP-CTAB EXT- No edema, mild swelling and curvature at MIP joints of hands Psych- not depressed or overly anxious appearing        Assessment & Plan:

## 2011-12-19 ENCOUNTER — Other Ambulatory Visit: Payer: Self-pay | Admitting: Family Medicine

## 2012-06-06 ENCOUNTER — Other Ambulatory Visit: Payer: Self-pay | Admitting: Family Medicine

## 2012-06-07 NOTE — Telephone Encounter (Signed)
Needs appt

## 2012-06-24 ENCOUNTER — Telehealth: Payer: Self-pay | Admitting: Family Medicine

## 2012-06-24 NOTE — Telephone Encounter (Signed)
pls call pt and let her know that her insurance has contacted ne re concerns regarding her compliance with taking medication prescribed, lisinopril for blood pressure, also of the need to have lung function testing doe, I am encouraging her to schedule and keep a follow up appt so that thes e issues as well as any health concerns she may have are addressed, Also needs a flu vaccine , we have them Pls sched appt if she agrees

## 2012-06-26 NOTE — Telephone Encounter (Signed)
Left message

## 2012-07-16 ENCOUNTER — Other Ambulatory Visit: Payer: Self-pay | Admitting: Family Medicine

## 2012-07-23 ENCOUNTER — Other Ambulatory Visit: Payer: Self-pay | Admitting: Family Medicine

## 2012-09-16 ENCOUNTER — Other Ambulatory Visit: Payer: Self-pay | Admitting: Family Medicine

## 2012-09-24 ENCOUNTER — Other Ambulatory Visit: Payer: Self-pay | Admitting: Family Medicine

## 2012-09-27 ENCOUNTER — Encounter (HOSPITAL_COMMUNITY): Payer: Self-pay | Admitting: *Deleted

## 2012-09-27 ENCOUNTER — Inpatient Hospital Stay (HOSPITAL_COMMUNITY): Payer: BC Managed Care – PPO

## 2012-09-27 ENCOUNTER — Emergency Department (HOSPITAL_COMMUNITY): Payer: BC Managed Care – PPO

## 2012-09-27 ENCOUNTER — Inpatient Hospital Stay (HOSPITAL_COMMUNITY)
Admission: EM | Admit: 2012-09-27 | Discharge: 2012-09-29 | DRG: 014 | Disposition: A | Discharge status: Home / Self Care | Payer: BC Managed Care – PPO | Attending: Neurology | Admitting: Neurology

## 2012-09-27 DIAGNOSIS — I6350 Cerebral infarction due to unspecified occlusion or stenosis of unspecified cerebral artery: Principal | ICD-10-CM | POA: Diagnosis present

## 2012-09-27 DIAGNOSIS — F3289 Other specified depressive episodes: Secondary | ICD-10-CM | POA: Diagnosis present

## 2012-09-27 DIAGNOSIS — G8929 Other chronic pain: Secondary | ICD-10-CM | POA: Diagnosis present

## 2012-09-27 DIAGNOSIS — F329 Major depressive disorder, single episode, unspecified: Secondary | ICD-10-CM | POA: Diagnosis present

## 2012-09-27 DIAGNOSIS — J45909 Unspecified asthma, uncomplicated: Secondary | ICD-10-CM | POA: Diagnosis present

## 2012-09-27 DIAGNOSIS — Z9282 Status post administration of tPA (rtPA) in a different facility within the last 24 hours prior to admission to current facility: Secondary | ICD-10-CM

## 2012-09-27 DIAGNOSIS — I10 Essential (primary) hypertension: Secondary | ICD-10-CM | POA: Diagnosis present

## 2012-09-27 DIAGNOSIS — R209 Unspecified disturbances of skin sensation: Secondary | ICD-10-CM | POA: Diagnosis present

## 2012-09-27 DIAGNOSIS — R0989 Other specified symptoms and signs involving the circulatory and respiratory systems: Secondary | ICD-10-CM

## 2012-09-27 DIAGNOSIS — Z79899 Other long term (current) drug therapy: Secondary | ICD-10-CM

## 2012-09-27 DIAGNOSIS — G2581 Restless legs syndrome: Secondary | ICD-10-CM | POA: Diagnosis present

## 2012-09-27 DIAGNOSIS — R030 Elevated blood-pressure reading, without diagnosis of hypertension: Secondary | ICD-10-CM

## 2012-09-27 DIAGNOSIS — I639 Cerebral infarction, unspecified: Secondary | ICD-10-CM

## 2012-09-27 DIAGNOSIS — E785 Hyperlipidemia, unspecified: Secondary | ICD-10-CM | POA: Diagnosis present

## 2012-09-27 DIAGNOSIS — Z87891 Personal history of nicotine dependence: Secondary | ICD-10-CM

## 2012-09-27 DIAGNOSIS — G589 Mononeuropathy, unspecified: Secondary | ICD-10-CM | POA: Diagnosis present

## 2012-09-27 DIAGNOSIS — R29898 Other symptoms and signs involving the musculoskeletal system: Secondary | ICD-10-CM | POA: Diagnosis present

## 2012-09-27 LAB — URINALYSIS, ROUTINE W REFLEX MICROSCOPIC
Ketones, ur: NEGATIVE mg/dL
Leukocytes, UA: NEGATIVE
Nitrite: NEGATIVE
Protein, ur: NEGATIVE mg/dL
Urobilinogen, UA: 0.2 mg/dL (ref 0.0–1.0)

## 2012-09-27 LAB — CBC
Hemoglobin: 13.5 g/dL (ref 12.0–15.0)
MCH: 28.7 pg (ref 26.0–34.0)
Platelets: 268 10*3/uL (ref 150–400)
RBC: 4.71 MIL/uL (ref 3.87–5.11)
WBC: 5.8 10*3/uL (ref 4.0–10.5)

## 2012-09-27 LAB — RAPID URINE DRUG SCREEN, HOSP PERFORMED
Amphetamines: NOT DETECTED
Barbiturates: NOT DETECTED
Benzodiazepines: NOT DETECTED

## 2012-09-27 LAB — DIFFERENTIAL
Eosinophils Absolute: 0.2 10*3/uL (ref 0.0–0.7)
Lymphs Abs: 2.1 10*3/uL (ref 0.7–4.0)
Monocytes Relative: 8 % (ref 3–12)
Neutro Abs: 3.1 10*3/uL (ref 1.7–7.7)
Neutrophils Relative %: 53 % (ref 43–77)

## 2012-09-27 LAB — COMPREHENSIVE METABOLIC PANEL
ALT: 12 U/L (ref 0–35)
Albumin: 4.3 g/dL (ref 3.5–5.2)
Alkaline Phosphatase: 103 U/L (ref 39–117)
BUN: 19 mg/dL (ref 6–23)
Chloride: 106 mEq/L (ref 96–112)
GFR calc Af Amer: >90 mL/min (ref >90)
Glucose, Bld: 120 mg/dL — ABNORMAL HIGH (ref 70–99)
Potassium: 4.1 mEq/L (ref 3.5–5.1)
Sodium: 139 mEq/L (ref 135–145)
Total Bilirubin: 0.3 mg/dL (ref 0.3–1.2)
Total Protein: 7.3 g/dL (ref 6.0–8.3)

## 2012-09-27 LAB — PROTIME-INR
INR: 0.93 (ref 0.00–1.49)
Prothrombin Time: 12.4 seconds (ref 11.6–15.2)

## 2012-09-27 LAB — APTT: aPTT: 26 seconds (ref 24–37)

## 2012-09-27 LAB — GLUCOSE, CAPILLARY: Glucose-Capillary: 131 mg/dL — ABNORMAL HIGH (ref 70–99)

## 2012-09-27 LAB — TROPONIN I: Troponin I: <0.3 ng/mL (ref <0.30)

## 2012-09-27 MED ORDER — ALTEPLASE (STROKE) FULL DOSE INFUSION
67.5000 mg | Freq: Once | INTRAVENOUS | Status: AC
  Administered 2012-09-27: 67.5 mg via INTRAVENOUS

## 2012-09-27 MED ORDER — ACETAMINOPHEN 650 MG RE SUPP: 650.0000 mg | RECTAL | Status: DC | PRN

## 2012-09-27 MED ORDER — SODIUM CHLORIDE 0.9 % IV SOLN: INTRAVENOUS | Status: DC

## 2012-09-27 MED ORDER — PANTOPRAZOLE SODIUM 40 MG IV SOLR
40.0000 mg | Freq: Every day | INTRAVENOUS | Status: DC
  Filled 2012-09-27 (×2): qty 40

## 2012-09-27 MED ORDER — SODIUM CHLORIDE 0.9 % IV SOLN: 250.0000 mL | Freq: Once | INTRAVENOUS | Status: DC

## 2012-09-27 MED ORDER — ALTEPLASE 100 MG IV SOLR
INTRAVENOUS | Status: AC
  Filled 2012-09-27: qty 1

## 2012-09-27 MED ORDER — ACETAMINOPHEN 325 MG PO TABS
ORAL_TABLET | ORAL | Status: AC
  Filled 2012-09-27: qty 2

## 2012-09-27 MED ORDER — SENNOSIDES-DOCUSATE SODIUM 8.6-50 MG PO TABS
1.0000 | ORAL_TABLET | Freq: Every evening | ORAL | Status: DC | PRN
  Filled 2012-09-27: qty 1

## 2012-09-27 MED ORDER — LABETALOL HCL 5 MG/ML IV SOLN: 10.0000 mg | INTRAVENOUS | Status: DC | PRN

## 2012-09-27 MED ORDER — ACETAMINOPHEN 325 MG PO TABS
650.0000 mg | ORAL_TABLET | ORAL | Status: DC | PRN
  Administered 2012-09-27 – 2012-09-29 (×2): 650 mg via ORAL
  Filled 2012-09-27 (×2): qty 2

## 2012-09-27 MED ORDER — ONDANSETRON HCL 4 MG/2ML IJ SOLN
4.0000 mg | Freq: Four times a day (QID) | INTRAMUSCULAR | Status: DC | PRN
  Administered 2012-09-29: 4 mg via INTRAVENOUS
  Filled 2012-09-27: qty 2

## 2012-09-27 NOTE — ED Notes (Signed)
Neurologist advised to hold foley catheter insertion

## 2012-09-27 NOTE — Progress Notes (Signed)
Echocardiogram 2D Echocardiogram has been performed.  Ellender Hose A 09/27/2012, 5:28 PM

## 2012-09-27 NOTE — ED Provider Notes (Signed)
History  This chart was scribed for Teresa Gaskins, MD by Ardeen Jourdain, ED Scribe. This patient was seen in room APA10/APA10 and the patient's care was started at 1050.   CSN: [id number]  Arrival date & time 09/27/12  1033   First MD Initiated Contact with Patient 09/27/12 1050      Chief Complaint  Patient presents with  . Numbness  . Weakness     Patient is a 56 y.o. female presenting with weakness. The history is provided by the patient. No language interpreter was used.  Weakness The primary symptoms include headaches. Primary symptoms do not include syncope, loss of consciousness, altered mental status, seizures, dizziness, visual change, paresthesias, focal weakness, loss of sensation, speech change, memory loss, fever, nausea or vomiting. The symptoms began 1 to 2 hours ago. The symptoms are unchanged.  The headache is associated with weakness. The headache is not associated with visual change, neck stiffness or paresthesias.  Additional symptoms include weakness. Additional symptoms do not include neck stiffness, pain, vertigo or anxiety. Medical issues do not include seizures.    Teresa Soto is a 56 y.o. female who presents to the Emergency Department complaining of lightheadedness with associated HA. She states she started "feeling funny" earlier today, which was the cause of her visiting the ED. She reports her tongue felt thick and she has weakness and numbness in her right hand. She denies numbness in her arms and legs. She reports feeling like her symptoms were similar to when she had a vertigo attack in the past. She reports having surgery on her back 2 years ago.  Past Medical History  Diagnosis Date  . Vertigo   . Hypertension   . Asthma   . Neuropathic pain   . Restless legs syndrome 2007 approx  . Depression     Past Surgical History  Procedure Date  . Back surgery   . Tonsillectomy   . Cesarean section     x 2  . Shoulder arthroscopy w/ rotator  cuff repair     to right shoulder  . Tubal ligation   . Spine surgery 12/2010    ruptd L1 L2 , Dr Channing Mutters    No family history on file.  History  Substance Use Topics  . Smoking status: Former Games developer  . Smokeless tobacco: Not on file  . Alcohol Use: No    OB History    Grav Para Term Preterm Abortions TAB SAB Ect Mult Living                  Review of Systems  Constitutional: Negative for fever and activity change.  HENT: Negative for neck stiffness.   Respiratory: Negative for shortness of breath.   Cardiovascular: Negative for chest pain and syncope.  Gastrointestinal: Negative for nausea and vomiting.  Neurological: Positive for weakness and headaches. Negative for dizziness, vertigo, speech change, focal weakness, seizures, loss of consciousness and paresthesias.  Psychiatric/Behavioral: Negative for memory loss and altered mental status.  All other systems reviewed and are negative.    Allergies  Review of patient's allergies indicates no known allergies.  Home Medications   Current Outpatient Rx  Name  Route  Sig  Dispense  Refill  . ALBUTEROL SULFATE HFA 108 (90 BASE) MCG/ACT IN AERS   Inhalation   Inhale 2 puffs into the lungs every 6 (six) hours as needed.         . BUDESONIDE-FORMOTEROL FUMARATE 160-4.5 MCG/ACT IN AERO   Inhalation  Inhale 2 puffs into the lungs 2 (two) times daily.         . CELECOXIB 200 MG PO CAPS   Oral   Take 1 capsule (200 mg total) by mouth daily.   30 capsule   3   . CETIRIZINE HCL 10 MG PO TABS   Oral   Take 10 mg by mouth daily.           . ERGOCALCIFEROL 50000 UNITS PO CAPS   Oral   Take 1 capsule (50,000 Units total) by mouth once a week.   4 capsule   1   . ESCITALOPRAM OXALATE 20 MG PO TABS      TAKE ONE TABLET BY MOUTH EVERY DAY   30 tablet   0     Needs an appt before further refills   . GABAPENTIN 300 MG PO CAPS   Oral   Take 300 mg by mouth daily.           Marland Kitchen LISINOPRIL 20 MG PO TABS       TAKE ONE TABLET BY MOUTH EVERY DAY   30 tablet   3   . PRAMIPEXOLE DIHYDROCHLORIDE 0.25 MG PO TABS      TAKE ONE TABLET BY MOUTH EVERY DAY AT BEDTIME --  **NEEDS  APPOINTMENT  WITH  DOCTOR**   30 tablet   0   . PRAMIPEXOLE DIHYDROCHLORIDE 0.25 MG PO TABS      TAKE ONE TABLET BY MOUTH EVERY DAY AT BEDTIME --  **NEEDS  APPOINTMENT  WITH  DOCTOR**   30 tablet   0     Triage Vitals: BP 153/91  Pulse 90  Temp 98.1 F (36.7 C) (Oral)  Resp 18  SpO2 99%  Physical Exam  CONSTITUTIONAL: Well developed/well nourished HEAD AND FACE: Normocephalic/atraumatic EYES: EOMI/PERRL ENMT: Mucous membranes moist NECK: supple no meningeal signs, no bruits noted SPINE:entire spine nontender CV: S1/S2 noted, no murmurs/rubs/gallops noted LUNGS: Lungs are clear to auscultation bilaterally, no apparent distress ABDOMEN: soft, nontender, no rebound or guarding GU:no cva tenderness NEURO: Pt is awake/alert, moves all extremitiesx4 NIHSS currently at 1, but pt reports speech is changed.  ?drift in right LE but she is able to maintain against gravity EXTREMITIES: pulses normal, full ROM SKIN: warm, color normal PSYCH: no abnormalities of mood noted   ED Course  Procedures  CRITICAL CARE Performed by: Teresa Soto   Total critical care time: 36  Critical care time was exclusive of separately billable procedures and treating other patients.  Critical care was necessary to treat or prevent imminent or life-threatening deterioration.  Critical care was time spent personally by me on the following activities: development of treatment plan with patient and/or surrogate as well as nursing, discussions with consultants, evaluation of patient's response to treatment, examination of patient, obtaining history from patient or surrogate, ordering and performing treatments and interventions, ordering and review of laboratory studies, ordering and review of radiographic studies, pulse oximetry and  re-evaluation of patient's condition.   DIAGNOSTIC STUDIES: Oxygen Saturation is 99% on room air, normal by my interpretation.    COORDINATION OF CARE:  10:55 AM: Discussed treatment plan which includes a CAT scan and blood work with pt at bedside and pt agreed to plan. Pt with onset at 9am.  She reports difficulty speaking and right sided weakness/numbness.  Code stroke called and teleneuro consulted.   11:53 AM D/w dr Dian Queen, neuro He recommends IV TPA due to symptoms and gait disturbance that he  noticed on video Pt agrees to TPA He has discussed risks with patient and she agrees We reviewed contraindications and she has no contraindications 12:02 PM D/w dr Thad Ranger, accept to [place] [facility] Will start TPA here and transfer to [facility] Pt stabilized in the ED  MDM  Nursing notes including past medical history and social history reviewed and considered in documentation Labs/vital reviewed and considered        Date: 09/27/2012  Rate: 88  Rhythm: normal sinus rhythm  QRS Axis: normal  Intervals: normal  ST/T Wave abnormalities: nonspecific ST changes  Conduction Disutrbances:none  Narrative Interpretation:   Old EKG Reviewed: unchanged    I personally performed the services described in this documentation, which was scribed in my presence. The recorded information has been reviewed and is accurate.      Teresa Gaskins, MD 09/27/12 [phone]

## 2012-09-27 NOTE — ED Notes (Signed)
Tele-Neuro consult at this time. Pt passed swallow screen. Pt is talking, no difficultly noted at this time.

## 2012-09-27 NOTE — ED Notes (Signed)
Dr.wickline notified of pt's arrival.  

## 2012-09-27 NOTE — ED Notes (Signed)
Pt reports "feeling funny " around 9am, then started noting that she was lightheaded, tongue felt thick and right hand felt weaker and numb.

## 2012-09-27 NOTE — H&P (Addendum)
Admission H&P    Chief Complaint: Right sided numbness HPI: Teresa Soto is an 56 y.o. female who reports that she was at work today and acutely began to feel dizzy and "weird".  Then noted that she was unable to write with her right hand and was unable to hold the pen.  Patient was told to go home from work but continued to feel "weird".  With walking felt as if she could not feel the ground with her right foot.  Patient presented to the hospital at that time.  Was evaluated by teleneurology and tPA administered.  Patient transferred here for admission.    LSN: [id number] tPA Given: Yes  Past Medical History  Diagnosis Date  . Vertigo   . Hypertension   . Asthma   . Neuropathic pain   . Restless legs syndrome 2007 approx  . Depression     Past Surgical History  Procedure Date  . Back surgery   . Tonsillectomy   . Cesarean section     x 2  . Shoulder arthroscopy w/ rotator cuff repair     to right shoulder  . Tubal ligation   . Spine surgery 12/2010    ruptd L1 L2 , Dr Channing Mutters    Family history:  Mother and maternal grandfather with hypertension.  Maternal grandfather with CHF as well.  Father deceased of unknown causes.  Has a sister with arthritis.  Has 5 children that are alive and well.    Social History:  reports that she has quit smoking. She does not have any smokeless tobacco history on file. She reports that she does not drink alcohol or use illicit drugs.  Allergies: No Known Allergies  Medications Prior to Admission  Medication Sig Dispense Refill  . albuterol (PROVENTIL HFA;VENTOLIN HFA) 108 (90 BASE) MCG/ACT inhaler Inhale 2 puffs into the lungs every 6 (six) hours as needed. Shortness of Breath      . budesonide-formoterol (SYMBICORT) 160-4.5 MCG/ACT inhaler Inhale 2 puffs into the lungs 2 (two) times daily.      . cetirizine (ZYRTEC) 10 MG tablet Take 10 mg by mouth daily.        Marland Kitchen escitalopram (LEXAPRO) 20 MG tablet Take 20 mg by mouth daily.      Marland Kitchen gabapentin  (NEURONTIN) 300 MG capsule Take 600 mg by mouth daily.       Marland Kitchen lidocaine (LIDODERM) 5 % Place 1 patch onto the skin daily as needed. Remove & Discard patch within 12 hours or as directed by MD. Pain      . lisinopril (PRINIVIL,ZESTRIL) 20 MG tablet Take 20 mg by mouth daily.      . naproxen sodium (ALEVE) 220 MG tablet Take 220 mg by mouth daily.      . pramipexole (MIRAPEX) 0.25 MG tablet Take 0.25 mg by mouth at bedtime.        ROS: History obtained from the patient  General ROS: negative for - chills, fatigue, fever, night sweats, weight gain or weight loss Psychological ROS: negative for - behavioral disorder, hallucinations, memory difficulties, mood swings or suicidal ideation Ophthalmic ROS: negative for - blurry vision, double vision, eye pain or loss of vision ENT ROS: negative for - epistaxis, nasal discharge, oral lesions, sore throat, tinnitus or vertigo Allergy and Immunology ROS: negative for - hives or itchy/watery eyes Hematological and Lymphatic ROS: negative for - bleeding problems, bruising or swollen lymph nodes Endocrine ROS: negative for - galactorrhea, hair pattern changes, polydipsia/polyuria or temperature  intolerance Respiratory ROS: negative for - cough, hemoptysis, shortness of breath or wheezing Cardiovascular ROS: negative for - chest pain, dyspnea on exertion, edema or irregular heartbeat Gastrointestinal ROS: negative for - abdominal pain, diarrhea, hematemesis, nausea/vomiting or stool incontinence Genito-Urinary ROS: negative for - dysuria, hematuria, incontinence or urinary frequency/urgency Musculoskeletal ROS: negative for - joint swelling or muscular weakness Neurological ROS: as noted in HPI Dermatological ROS: negative for rash and skin lesion changes  Physical Examination: Blood pressure 125/75, pulse 88, temperature 98.1 F (36.7 C), temperature source Oral, resp. rate 18, height 5\' 1"  (1.549 m), weight 73.1 kg (161 lb 2.5 oz), SpO2  97.00%.  General Examination: HEENT-  Normocephalic, no lesions, without obvious abnormality.  Normal external eye and conjunctiva.  Normal TM's bilaterally.  Normal auditory canals and external ears. Normal external nose, mucus membranes and septum.  Normal pharynx. Neck supple with no masses, nodes, nodules or enlargement. Cardiovascular - S1, S2 normal Lungs - chest clear, no wheezing, rales, normal symmetric air entry Abdomen - soft, non-tender; bowel sounds normal; no masses,  no organomegaly Extremities - no edema  Neurologic Examination: Mental Status: Alert, oriented, thought content appropriate.  Speech fluent without evidence of aphasia.  Able to follow 3 step commands without difficulty. Cranial Nerves: II: Discs flat bilaterally; Visual fields grossly normal, pupils equal, round, reactive to light and accommodation III,IV, VI: ptosis not present, extra-ocular motions intact bilaterally V,VII: smile symmetric, facial light touch sensation decreased on the right VIII: hearing normal bilaterally IX,X: gag reflex present XI: bilateral shoulder shrug XII: midline tongue extension Motor: Right : Upper extremity   5/5, 5-/5 hand grip    Left:     Upper extremity   5/5  Lower extremity   5/5       Lower extremity   5/5 Tone and bulk:normal tone throughout; no atrophy noted Sensory: Pinprick and light touch decreased on the right Deep Tendon Reflexes: 2+ and symmetric throughout Plantars: Right: downgoing   Left: downgoing Cerebellar: normal finger-to-nose and normal heel-to-shin test Gait: unable to be tested CV: pulses palpable throughout   Laboratory Studies:   Basic Metabolic Panel:  Lab 09/27/12 1191  NA 139  K 4.1  CL 106  CO2 26  GLUCOSE 120*  BUN 19  CREATININE 0.81  CALCIUM 11.5*  MG --  PHOS --    Liver Function Tests:  Lab 09/27/12 0956  AST 13  ALT 12  ALKPHOS 103  BILITOT 0.3  PROT 7.3  ALBUMIN 4.3   No results found for this basename:  LIPASE:5,AMYLASE:5 in the last 168 hours No results found for this basename: AMMONIA:3 in the last 168 hours  CBC:  Lab 09/27/12 0956  WBC 5.8  NEUTROABS 3.1  HGB 13.5  HCT 40.1  MCV 85.1  PLT 268    Cardiac Enzymes:  Lab 09/27/12 0956  CKTOTAL --  CKMB --  CKMBINDEX --  TROPONINI <0.30    BNP: No components found with this basename: POCBNP:5  CBG:  Lab 09/27/12 1105  GLUCAP 131*    Microbiology: No results found for this or any previous visit.  Coagulation Studies:  Basename 09/27/12 0956  LABPROT 12.4  INR 0.93    Urinalysis:  Lab 09/27/12 1240  COLORURINE YELLOW  LABSPEC 1.010  PHURINE 6.0  GLUCOSEU NEGATIVE  HGBUR NEGATIVE  BILIRUBINUR NEGATIVE  KETONESUR NEGATIVE  PROTEINUR NEGATIVE  UROBILINOGEN 0.2  NITRITE NEGATIVE  LEUKOCYTESUR NEGATIVE    Lipid Panel:     Component Value Date/Time  CHOL 217* 11/07/2011 1040   TRIG 153* 11/07/2011 1040   HDL 41 11/07/2011 1040   CHOLHDL 5.3 11/07/2011 1040   VLDL 31 11/07/2011 1040   LDLCALC 145* 11/07/2011 1040    HgbA1C:  Lab Results  Component Value Date   HGBA1C 6.1* 11/07/2011    Urine Drug Screen:     Component Value Date/Time   LABOPIA NONE DETECTED 09/27/2012 1240   COCAINSCRNUR NONE DETECTED 09/27/2012 1240   LABBENZ NONE DETECTED 09/27/2012 1240   AMPHETMU NONE DETECTED 09/27/2012 1240   THCU NONE DETECTED 09/27/2012 1240   LABBARB NONE DETECTED 09/27/2012 1240    Alcohol Level: No results found for this basename: ETH:2 in the last 168 hours  Imaging: Ct Head Wo Contrast  09/27/2012  *RADIOLOGY REPORT*  Clinical Data:  Weakness and numbness  CT HEAD WITHOUT CONTRAST  Technique:  Contiguous axial images were obtained from the base of the skull through the vertex without contrast  Comparison:  None.  Findings:  The brain has a normal appearance without evidence for hemorrhage, acute infarction, hydrocephalus, or mass lesion.  There is no extra axial fluid collection.  The skull and  paranasal sinuses are normal.  IMPRESSION: Normal CT of the head without contrast.   Original Report Authenticated By: Janeece Riggers, M.D.     Assessment: 56 y.o. female presenting with right sided numbness and mild right sided weakness.  Suspect small vessel disease.  CT was unremarkable.  Patient was given tPA.  Now here for further management.  Patient on no antiplatelet therapy at home.    Stroke Risk Factors - hypertension  Plan: 1. HgbA1c, fasting lipid panel 2. MRI, MRA  of the brain without contrast 3. PT consult, OT consult, Speech consult 4. Echocardiogram 5. Carotid dopplers 6. Prophylactic therapy-None 7. Risk factor modification 8. Telemetry monitoring 9. Frequent neuro checks 10. Repeat head CT in 24 hours 11. Admit to ICU  This patient is critically ill and at significant risk of neurological worsening, death and care requires constant monitoring of vital signs, hemodynamics,respiratory and cardiac monitoring, neurological assessment, discussion with family, other specialists and medical decision making of high complexity. I spent 35 minutes of neurocritical care time  in the care of  this patient.  Thana Farr, MD [facility] [phone] 09/27/2012, 2:23 PM

## 2012-09-27 NOTE — ED Notes (Addendum)
Pt transported via [facility] emergency traffic. Pt stable, TPA infusing per MD orders without difficulty. Foley catheter not placed due to verbal order from Neurologist via tele-neuro consult.

## 2012-09-28 ENCOUNTER — Inpatient Hospital Stay (HOSPITAL_COMMUNITY): Payer: BC Managed Care – PPO

## 2012-09-28 DIAGNOSIS — R0989 Other specified symptoms and signs involving the circulatory and respiratory systems: Secondary | ICD-10-CM

## 2012-09-28 LAB — HEMOGLOBIN A1C
Hgb A1c MFr Bld: 6 % — ABNORMAL HIGH (ref <5.7)
Mean Plasma Glucose: 126 mg/dL — ABNORMAL HIGH (ref <117)

## 2012-09-28 LAB — LIPID PANEL
Cholesterol: 223 mg/dL — ABNORMAL HIGH (ref 0–200)
HDL: 41 mg/dL (ref >39)
Triglycerides: 156 mg/dL — ABNORMAL HIGH (ref <150)

## 2012-09-28 MED ORDER — GABAPENTIN 300 MG PO CAPS
600.0000 mg | ORAL_CAPSULE | Freq: Every day | ORAL | Status: DC
  Administered 2012-09-28: 600 mg via ORAL
  Filled 2012-09-28 (×2): qty 2

## 2012-09-28 MED ORDER — ASPIRIN 325 MG PO TABS
325.0000 mg | ORAL_TABLET | Freq: Every day | ORAL | Status: DC
  Administered 2012-09-28 – 2012-09-29 (×2): 325 mg via ORAL
  Filled 2012-09-28 (×2): qty 1

## 2012-09-28 MED ORDER — LIDOCAINE 5 % EX PTCH
1.0000 | MEDICATED_PATCH | Freq: Every day | CUTANEOUS | Status: DC | PRN
  Filled 2012-09-28: qty 1

## 2012-09-28 MED ORDER — ESCITALOPRAM OXALATE 20 MG PO TABS
20.0000 mg | ORAL_TABLET | Freq: Every day | ORAL | Status: DC
  Administered 2012-09-28 – 2012-09-29 (×2): 20 mg via ORAL
  Filled 2012-09-28 (×2): qty 1

## 2012-09-28 MED ORDER — BUDESONIDE-FORMOTEROL FUMARATE 160-4.5 MCG/ACT IN AERO
2.0000 | INHALATION_SPRAY | Freq: Two times a day (BID) | RESPIRATORY_TRACT | Status: DC
  Administered 2012-09-28 – 2012-09-29 (×3): 2 via RESPIRATORY_TRACT
  Filled 2012-09-28: qty 6

## 2012-09-28 MED ORDER — PRAMIPEXOLE DIHYDROCHLORIDE 0.25 MG PO TABS
0.2500 mg | ORAL_TABLET | Freq: Every day | ORAL | Status: DC
  Administered 2012-09-28: 0.25 mg via ORAL
  Filled 2012-09-28 (×3): qty 1

## 2012-09-28 MED ORDER — PANTOPRAZOLE SODIUM 40 MG PO TBEC
40.0000 mg | DELAYED_RELEASE_TABLET | Freq: Every day | ORAL | Status: DC
  Administered 2012-09-29: 40 mg via ORAL
  Filled 2012-09-28 (×2): qty 1

## 2012-09-28 MED ORDER — ALBUTEROL SULFATE HFA 108 (90 BASE) MCG/ACT IN AERS
2.0000 | INHALATION_SPRAY | Freq: Four times a day (QID) | RESPIRATORY_TRACT | Status: DC | PRN
  Filled 2012-09-28: qty 6.7

## 2012-09-28 MED ORDER — SIMVASTATIN 20 MG PO TABS
20.0000 mg | ORAL_TABLET | Freq: Every day | ORAL | Status: DC
  Administered 2012-09-28: 20 mg via ORAL
  Filled 2012-09-28 (×2): qty 1

## 2012-09-28 MED ORDER — NAPROXEN 250 MG PO TABS
250.0000 mg | ORAL_TABLET | Freq: Every day | ORAL | Status: DC
  Administered 2012-09-29: 250 mg via ORAL
  Filled 2012-09-28 (×2): qty 1

## 2012-09-28 NOTE — Progress Notes (Signed)
MRI shows no acute stroke or bleeding. Ok for aspirin and transfer to floor.  [facility] - Stroke Team Joycelyn Schmid, MD 09/28/2012, 3:03 PM   Please refer to [email or web address] for on-call Stroke MD

## 2012-09-28 NOTE — Evaluation (Signed)
Speech Language Pathology Evaluation Patient Details Name: Teresa Soto MRN: [id number] DOB: 07-Dec-1955 Today's Date: 09/28/2012 Time: 4098-1191 SLP Time Calculation (min): 20 min  Problem List:  Patient Active Problem List  Diagnosis  . HYPERLIPIDEMIA  . DEPRESSION  . TINNITUS, RIGHT  . HYPERTENSION  . ASTHMA  . DEGENERATIVE JOINT DISEASE  . ARTHRITIS  . SCIATICA, LEFT  . BACK PAIN, LUMBAR, WITH RADICULOPATHY  . VERTIGO  . INSOMNIA  . FATIGUE  . PERIPHERAL EDEMA  . HYPERGLYCEMIA  . Hearing loss  . Carotid bruit  . Orthostatic hypotension  . Vitamin d deficiency  . Hypercalcemia   Past Medical History:  Past Medical History  Diagnosis Date  . Vertigo   . Hypertension   . Asthma   . Neuropathic pain   . Restless legs syndrome 2007 approx  . Depression    Past Surgical History:  Past Surgical History  Procedure Date  . Back surgery   . Tonsillectomy   . Cesarean section     x 2  . Shoulder arthroscopy w/ rotator cuff repair     to right shoulder  . Tubal ligation   . Spine surgery 12/2010    ruptd L1 L2 , Dr Channing Mutters   HPI:  Teresa Soto is an 56 y.o. female who reports that she was at work today and acutely began to feel dizzy and "weird".  Then noted that she was unable to write with her right hand and was unable to hold the pen.  Patient was told to go home from work but continued to feel "weird".  With walking felt as if she could not feel the ground with her right foot.  Patient presented to the hospital at that time.  Was evaluated by teleneurology and tPA administered.  Patient transferred here for admission.  Patient referred for Cognitive Linguistic evaluation per stroke protocol.    Assessment / Plan / Recommendation Clinical Impression  Cognitve Linguistic evaluation completed.  Minimal deficits in areas of alternating attention and safety awareness.  No Speech Language Pathology Services warranted in acute care setting as cognitive skills judged to  be functional.  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  Patient may benefit from comprehensive cognitive evaluation and treatment in outpatient setting if warranted.     SLP Assessment  All further Speech Lanaguage Pathology  needs can be addressed in the next venue of care    Follow Up Recommendations  Outpatient SLP    Frequency and Duration             SLP Evaluation Prior Functioning  Cognitive/Linguistic Baseline: Within functional limits Type of Home: House Lives With: Spouse Available Help at Discharge: Family;Available PRN/intermittently Vocation: Full time employment   Cognition  Arousal/Alertness: Awake/alert Orientation Level: Oriented X4 Attention: Focused;Sustained;Selective;Alternating Focused Attention: Appears intact Sustained Attention: Appears intact Selective Attention: Appears intact Alternating Attention: Impaired Alternating Attention Impairment: Verbal complex;Functional complex Memory: Appears intact Awareness: Appears intact Problem Solving: Impaired Problem Solving Impairment: Functional complex;Verbal complex Safety/Judgment: Impaired    Comprehension  Auditory Comprehension Overall Auditory Comprehension: Appears within functional limits for tasks assessed Yes/No Questions: Within Functional Limits Commands: Within Functional Limits Conversation: Complex Interfering Components: Anxiety Visual Recognition/Discrimination Discrimination: Within Function Limits Reading Comprehension Reading Status: Within funtional limits    Expression Expression Primary Mode of Expression: Verbal Verbal Expression Overall Verbal Expression: Appears within functional limits for tasks assessed Initiation: No impairment   Oral / Motor Oral Motor/Sensory Function Overall Oral Motor/Sensory Function:  Appears within functional limits for tasks assessed Motor Speech Overall Motor Speech: Appears within functional limits for tasks assessed    GO    Lonnie Rosado MS, CCC-SLP [phone] [facility] 09/28/2012, 10:10 AM

## 2012-09-28 NOTE — Evaluation (Signed)
Physical Therapy Evaluation Patient Details Name: Teresa Soto MRN: [id number] DOB: 08-09-1956 Today's Date: 09/28/2012 Time: 1308-6578 PT Time Calculation (min): 19 min  PT Assessment / Plan / Recommendation Clinical Impression  Pt admitted with CVA s/p tPa. Pt currently presenting with UE weakness only, no gait or balance impairments. Pt is at her baseline functional level, no further acute PT needs. Will not follow.    PT Assessment  Patent does not need any further PT services    Follow Up Recommendations  No PT follow up    Does the patient have the potential to tolerate intense rehabilitation      Barriers to Discharge        Equipment Recommendations  None recommended by PT    Recommendations for Other Services     Frequency      Precautions / Restrictions Precautions Precautions: None Restrictions Weight Bearing Restrictions: No   Pertinent Vitals/Pain No pain complaints      Mobility  Bed Mobility Bed Mobility: Supine to Sit;Sitting - Scoot to Edge of Bed;Sit to Supine Supine to Sit: 6: Modified independent (Device/Increase time) Sitting - Scoot to Edge of Bed: 6: Modified independent (Device/Increase time) Sit to Supine: 6: Modified independent (Device/Increase time) Transfers Transfers: Sit to Stand;Stand to Sit Sit to Stand: 6: Modified independent (Device/Increase time);With upper extremity assist;From bed;From toilet Stand to Sit: 6: Modified independent (Device/Increase time);With upper extremity assist;To bed;To toilet Details for Transfer Assistance: No assistance needed Ambulation/Gait Ambulation/Gait Assistance: 5: Supervision Ambulation Distance (Feet): 200 Feet Assistive device: None Ambulation/Gait Assistance Details: Supervision for safety only Gait Pattern: Within Functional Limits Gait velocity: normal gait speed Stairs: No (simulated secondary to IV/telemetry box) Modified Rankin (Stroke Patients Only) Pre-Morbid Rankin Score: No  symptoms Modified Rankin: No significant disability    Shoulder Instructions     Exercises     PT Diagnosis:    PT Problem List:   PT Treatment Interventions:     PT Goals    Visit Information  Last PT Received On: 09/28/12 Assistance Needed: +1    Subjective Data  Patient Stated Goal: to get home   Prior Functioning  Home Living Lives With: Spouse Available Help at Discharge: Family;Available PRN/intermittently Type of Home: House Home Access: Stairs to enter [employer] of Steps: 3 Entrance Stairs-Rails: Right;Can reach both;Left Home Layout: Two level Alternate Level Stairs-Number of Steps: 13 Alternate Level Stairs-Rails: Right;Left;Can reach both Bathroom Shower/Tub: Walk-in shower;Door [employer] Toilet: Standard Bathroom Accessibility: Yes How Accessible: Accessible via walker Home Adaptive Equipment: Straight cane;Built-in shower seat Prior Function Level of Independence: Independent Able to Take Stairs?: Yes Driving: Yes Vocation: Full time employment Comments: Education officer, 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 Dominant Hand: Right    Cognition  Overall Cognitive Status: Appears within functional limits for tasks assessed/performed Arousal/Alertness: Awake/alert Orientation Level: Appears intact for tasks assessed Behavior During Session: [facility] for tasks performed    Extremity/Trunk Assessment Right Lower Extremity Assessment RLE ROM/Strength/Tone: Within functional levels RLE Sensation: WFL - Light Touch RLE Coordination: WFL - gross/fine motor Left Lower Extremity Assessment LLE ROM/Strength/Tone: Within functional levels LLE Sensation: WFL - Light Touch LLE Coordination: WFL - gross/fine motor   Balance Balance Balance Assessed: Yes Standardized Balance Assessment Standardized Balance Assessment: Dynamic Gait Index Dynamic Gait Index Level Surface: Normal Change in Gait Speed: Normal Gait with Horizontal Head Turns: Normal Gait  with Vertical Head Turns: Normal Gait and Pivot Turn: Normal Step Over Obstacle: Normal Step Around Obstacles: Normal Steps:  (could not assess secondary  to IV; simulated)  End of Session PT - End of Session Equipment Utilized During Treatment: Gait belt Activity Tolerance: Patient tolerated treatment well Patient left: in bed;with call bell/phone within reach;with family/visitor present Nurse Communication: Mobility status  GP     Milana Kidney 09/28/2012, 5:16 PM  09/28/2012 Milana Kidney DPT PAGER: [phone] OFFICE: [phone]

## 2012-09-28 NOTE — Progress Notes (Signed)
VASCULAR LAB PRELIMINARY  PRELIMINARY  PRELIMINARY  PRELIMINARY  Carotid Dopplers completed.    Preliminary report:  There is no ICA stenosis.  Vertebral artery flow is antegrade.  Latravious Levitt, RVT 09/28/2012, 12:40 PM

## 2012-09-28 NOTE — Progress Notes (Signed)
Stroke Team Rounding Chief Complaint: Right sided numbness  HPI: Teresa Soto is a 56 y.o. Female with HTN, hypercholesterolemia, RLS, chronic pain, who reports that she was at work on 09/27/12 and acutely began to feel dizzy and "weird". Then noted that she was unable to write with her right hand and was unable to hold the pen. Patient was told to go home from work but continued to feel "weird". With walking felt as if she could not feel the ground with her right foot. Patient presented to the hospital at that time. Was evaluated by teleneurology and tPA administered at [facility]. Patient transferred here for admission.  LSN: [id number]  tPA Given: Yes (09/27/12 @ 12:02pm)  Subjective: Didn't sleep well. Has mild HA. Otherwise, weakness and numbness have improved. Husband at bedside.   Objective: Vital signs in last 24 hours: Filed Vitals:   09/28/12 0400 09/28/12 0500 09/28/12 0600 09/28/12 0700  BP: 112/69 109/74 99/57 93/58   Pulse: 76 71 70 74  Temp: 98 F (36.7 C)     TempSrc: Oral     Resp: 15 14 19 18   Height:      Weight:      SpO2: 96% 97% 96% 97%     Intake/Output from previous day: 12/20 0701 - 12/21 0700 In: 1112.5 [P.O.:700; I.V.:412.5] Out: 1400 [Urine:1400]  GENERAL EXAM: Patient is in no distress  CARDIOVASCULAR: Regular rate and rhythm, no murmurs, no carotid bruits  NEUROLOGIC: MENTAL STATUS: awake, alert, language fluent, comprehension intact, naming intact CRANIAL NERVE: no papilledema on fundoscopic exam, pupils equal and reactive to light, visual fields full to confrontation, extraocular muscles intact, no nystagmus, facial sensation and strength symmetric, uvula midline, shoulder shrug symmetric, tongue midline. MOTOR: normal bulk and tone, full strength in the left side. RUE (TRICEPS 4/5) AND RLE (HF 4/5).  SENSORY: DECR ON RIGHT SIDE. COORDINATION: finger-nose-finger, fine finger movements SLIGHTLY SLOW ON RIGHT SIDE. REFLEXES: deep tendon reflexes  present and symmetric GAIT/STATION: BEDREST.  Lab Results:  [facility] 09/27/12 0956  WBC 5.8  HGB 13.5  HCT 40.1  PLT 268    BMET  Basename 09/27/12 0956  NA 139  K 4.1  CL 106  CO2 26  GLUCOSE 120*  BUN 19  CREATININE 0.81  CALCIUM 11.5*    LIPIDS:    Component Value Date/Time   CHOL 217* 11/07/2011 1040   TRIG 153* 11/07/2011 1040   HDL 41 11/07/2011 1040   CHOLHDL 5.3 11/07/2011 1040   VLDL 31 11/07/2011 1040   LDLCALC 145* 11/07/2011 1040    HEMOGLOBIN A1C: Lab Results  Component Value Date   HGBA1C 6.1* 11/07/2011     Studies/Results: Ct Head Wo Contrast  09/27/2012  *RADIOLOGY REPORT*  Clinical Data:  Weakness and numbness  CT HEAD WITHOUT CONTRAST  Technique:  Contiguous axial images were obtained from the base of the skull through the vertex without contrast  Comparison:  None.  Findings:  The brain has a normal appearance without evidence for hemorrhage, acute infarction, hydrocephalus, or mass lesion.  There is no extra axial fluid collection.  The skull and paranasal sinuses are normal.  IMPRESSION: Normal CT of the head without contrast.   Original Report Authenticated By: Janeece Riggers, M.D.    Dg Chest Port 1 View  09/27/2012  *RADIOLOGY REPORT*  Clinical Data: Stroke.  PORTABLE CHEST - 1 VIEW  Comparison: 03/20/2010.  Findings: Low volume chest with basilar atelectasis.  No airspace disease.  No effusion.  Cardiopericardial silhouette  appears within normal limits. Monitoring leads are projected over the chest.  IMPRESSION: Low volume chest with basilar atelectasis.   Original Report Authenticated By: Andreas Newport, M.D.     MRI brain  MRA head   Carotid u/s  TTE - report pending  Medications:     . sodium chloride  250 mL Intravenous Once  . pantoprazole (PROTONIX) IV  40 mg Intravenous QHS      . sodium chloride 75 mL/hr at 09/28/12 0700     Assessment: 56 y.o. female  with HTN, hypercholesterolemia, RLS, chronic pain, with right face,  arm, leg numbness and weakness. Suspect left brain, subcortical, small vessel ischemic infarction. S/p TPA.  Stroke Risk Factors - hypertension, hypercholesterolemia   LOS: 1 day   Plan: - check MRI brain, MRA head - check TTE, carotid u/s - check lipid panel, A1c - Risk factor modification (goal SBP <180, goal LDL < 100, goal A1c < 6) --> start simvastatin 20mg  qhs - Antiplatelet/Anticoagulation therapy: check 24 hour post TPA scan, then aspirin 81mg  daily - Head of bed < 30 degrees - Maintain euvolemia, euglycemia, euthermia - Telemetry - Frequent neuro checks - OOB with assistance - PT consult, OT consult  [facility] - Stroke Team Joycelyn Schmid, MD 09/28/2012, 9:48 AM   Please refer to [email or web address] for on-call Stroke MD

## 2012-09-28 NOTE — Progress Notes (Signed)
PT Cancellation Note  Patient Details Name: Teresa Soto MRN: [id number] DOB: Jun 28, 1956   Cancelled Treatment:    Reason Eval/Treat Not Completed: Patient not medically ready (Pt currently on bedrest).  Please update activity orders when appropriate.  Thanks!!!   Dondi Aime 09/28/2012, 7:59 AM Jake Shark, PT DPT [phone]

## 2012-09-29 ENCOUNTER — Inpatient Hospital Stay (HOSPITAL_COMMUNITY): Payer: BC Managed Care – PPO

## 2012-09-29 MED ORDER — SIMVASTATIN 20 MG PO TABS: 20.0000 mg | ORAL_TABLET | Freq: Every day | ORAL | Status: DC

## 2012-09-29 MED ORDER — ASPIRIN EC 81 MG PO TBEC: 81.0000 mg | DELAYED_RELEASE_TABLET | Freq: Every day | ORAL | Status: AC

## 2012-09-29 NOTE — Care Management (Signed)
    09/29/2012 Graylon Good  Mrs Elwell is not to return to work until October 14, 2012.   Delton See PA-C [facility] Joycelyn Schmid MD 09/29/2012, 2:25 PM

## 2012-09-29 NOTE — Progress Notes (Signed)
Stroke Team Rounding Chief Complaint: Right sided numbness  HPI: Teresa Soto is a 56 y.o. Female with HTN, hypercholesterolemia, RLS, chronic pain, who reports that she was at work on 09/27/12 and acutely began to feel dizzy and "weird". Then noted that she was unable to write with her right hand and was unable to hold the pen. Patient was told to go home from work but continued to feel "weird". With walking felt as if she could not feel the ground with her right foot. Patient presented to the hospital at that time. Was evaluated by teleneurology and tPA administered at [facility]. Patient transferred here for admission.  LSN: 0900 09/27/12 TPA Given: Yes (09/27/12 @ 12:02pm)  Subjective: Pt alone in room. Feels fine this am except for mild headache. Awoke around 3 am with frontal headache. Had nausea and vomiting and felt better. No nausea this am. No reports of abnormal bleeding. Pt. 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  Objective: Vital signs in last 24 hours: Filed Vitals:   09/28/12 2055 09/28/12 2200 09/29/12 0300 09/29/12 0645  BP:  135/78 154/87 145/78  Pulse: 80 85 100 85  Temp:  97.7 F (36.5 C)  98 F (36.7 C)  TempSrc:  Oral  Oral  Resp: 18 20 18 18   Height:      Weight:      SpO2: 97% 99% 95% 100%     Intake/Output from previous day: 12/21 0701 - 12/22 0700 In: 740 [P.O.:740] Out: 1150 [Urine:1150]  GENERAL EXAM: Patient is in no distress  CARDIOVASCULAR: Regular rate and rhythm, no murmurs, no carotid bruits  NEUROLOGIC: MENTAL STATUS: awake, alert, language fluent, comprehension intact, naming intact CRANIAL NERVE: no papilledema on fundoscopic exam, pupils equal and reactive to light, visual fields full to confrontation, extraocular muscles intact, no nystagmus, facial sensation and strength symmetric, uvula midline, shoulder shrug symmetric, tongue  midline. MOTOR: normal bulk and tone, full strength in the left side. RUE (TRICEPS 4/5) AND RLE (HF 4/5).  SENSORY: DECR ON RIGHT SIDE. COORDINATION: finger-nose-finger, fine finger movements SLIGHTLY SLOW ON RIGHT SIDE. REFLEXES: deep tendon reflexes present and symmetric GAIT/STATION: SITTING IN BED.  Lab Results:  [facility] 09/27/12 0956  WBC 5.8  HGB 13.5  HCT 40.1  PLT 268    BMET  Basename 09/27/12 0956  NA 139  K 4.1  CL 106  CO2 26  GLUCOSE 120*  BUN 19  CREATININE 0.81  CALCIUM 11.5*    LIPIDS:    Component Value Date/Time   CHOL 223* 09/28/2012 0857   TRIG 156* 09/28/2012 0857   HDL 41 09/28/2012 0857   CHOLHDL 5.4 09/28/2012 0857   VLDL 31 09/28/2012 0857   LDLCALC 151* 09/28/2012 0857    HEMOGLOBIN A1C: Lab Results  Component Value Date   HGBA1C 6.0* 09/28/2012     Studies/Results: Ct Head Wo Contrast 09/27/12  IMPRESSION: Normal CT of the head without contrast.      Mr Brain Wo Contrast 09/28/12 IMPRESSION: No acute intracranial abnormality. Scattered periventricular subcortical T2 hyperintensities are slightly greater than expected for age. The finding is nonspecific but can be seen in the setting of chronic microvascular ischemia, a demyelinating process such as multiple sclerosis, vasculitis, complicated migraine headaches, or as the sequelae of a prior infectious or inflammatory process.   MRA HEAD   IMPRESSION: Normal variant MRA circle of Willis without evidence for significant proximal  stenosis, aneurysm, or branch vessel occlusion.   Original Report Authenticated By: Marin Roberts, M.D.    Dg Chest Port 1 View 09/27/2012  IMPRESSION: Low volume chest with basilar atelectasis.   Original Report Authenticated By: Andreas Newport, M.D.    Carotid u/s - Carotid Dopplers completed. Preliminary report: There is no ICA stenosis. Vertebral artery flow is antegrade.  TTE - estimated ejection fraction was 60%. Regional wall motion  abnormalities cannot be excluded. No cardiac source of embolism was identified, but cannot be ruled out on the basis of this examination.  Physical Therapy -Pt admitted with CVA s/p tPa. Pt currently presenting with UE weakness only, no gait or balance impairments. Pt is at her baseline functional level, no further acute PT needs. Will not follow.   Speech - All further Speech Lanaguage Pathology needs can be addressed in the next venue of care  Follow Up Recommendations Outpatient SLP    Medications:     . aspirin  325 mg Oral Daily  . budesonide-formoterol  2 puff Inhalation BID  . escitalopram  20 mg Oral Daily  . gabapentin  600 mg Oral Daily  . naproxen  250 mg Oral Daily  . pantoprazole  40 mg Oral Q1200  . pramipexole  0.25 mg Oral QHS  . simvastatin  20 mg Oral q1800       Assessment: 56 y.o. female  with HTN, hypercholesterolemia, RLS, chronic pain, with right face, arm, leg numbness and weakness. Suspect left brain, subcortical, small vessel ischemic infarction. S/p TPA. No acute stroke on MRI. Likely represent MRI negative infarct.  Stroke Risk Factors - hypertension, hypercholesterolemia   LOS: 2 days   Plan: - Risk factor modification: restart anti-BP meds, continue simvastatin 20mg  qhs - Antiplatelet/Anticoagulation therapy: ASA 325 mg QD - repeat CT head today because of HA, n/v  Hassel Neth [facility] Pager [phone] 09/29/2012, 11:18 AM  I evaluated and examined patient, reviewed pertinent data and imaging myself, and agree with assessment and plan as above. Will check CT head. If stable, then plan to d/c home today. D/w patient and husband.   [facility] - Stroke Team Joycelyn Schmid, MD 09/29/2012, 9:57 AM   Please refer to [email or web address] for on-call Stroke MD

## 2012-09-29 NOTE — Discharge Summary (Signed)
Physician Discharge Summary  Patient ID: Teresa Soto MRN: [id number] DOB/AGE: 11-27-1955 56 y.o.  Admit date: 09/27/2012 Discharge date: 09/29/2012  Admission Diagnoses: Acute left CVA  Discharge Diagnoses: CVA treated with TPA Active Problems:  * No active hospital problems. *    Discharged Condition: stable  Hospital Course: This is a pleasant 56 year old female with a history of hypertension, hypercholesterolemia, RLS, and chronic pain who was at work on 09/27/2012 when she suddenly felt dizzy and ill. She drove herself to the emergency department at [facility] in [place] where she was evaluated by teleneurology and diagnosed with an acute left CVA. The patient was treated with intravenous tissue plasminogen activator and transported to [facility] for admission and further evaluation. During her stay at [facility] the patient was started on simvastatin for hyperlipidemia. She was placed on aspirin for secondary stroke prevention. She developed a severe headache with nausea and vomiting at around 3 AM on 09/29/2012 - a CT scan was repeated later that day which was negative for bleeding. The patient was evaluated by the physical therapist as well as the speech therapist. She had very mild residual weakness of the right upper and lower extremity as well as some dexterity problems with her right hand; however, no further therapy was felt to be indicated at this time.  Consults: None    Significant Diagnostic Studies:   CT of the head without contrast performed 09/27/2012 was interpreted as normal. MRI/MRA performed 09/28/2012 was negative for acute intracranial abnormalities. There were some nonspecific findings noted. Please see the report for full details. Chest x-ray 09/27/2012 revealed low volume with basilar atelectasis. A transthoracic echo revealed an ejection fraction of 60%. No source of embolism was identified. Carotid ultrasound-no internal  carotid artery stenosis was identified. Vertebral artery flow is antegrade.  A CT scan of the head performed 09/29/2012 was negative for an acute bleed.  Glucose was elevated at 120. Calcium was elevated at 11.5. Cholesterol was elevated to 23. Triglycerides were elevated at 156. LDL was 151. A hemoglobin A1c was 6.0.  Treatments: As noted the patient received TPA for an acute left CVA. She was started on simvastatin for hyperlipidemia. 24 hours after her TPA she was placed on aspirin 325 mg daily which was decreased to 81 mg daily at time of discharge.  Discharge Exam: Blood pressure 145/78, pulse 85, temperature 98 F (36.7 C), temperature source Oral, resp. rate 18, height 5\' 1"  (1.549 m), weight 73.1 kg (161 lb 2.5 oz), SpO2 100.00%. The patient was found to be an alert 56 year old female in no acute distress. Heart-regular rate and rhythm without murmur Lungs-clear to auscultation Skin-warm and dry Extremities-trace edema with intact peripheral pulses Mental status-the patient was alert and oriented with fluent speech and intact comprehension. Cranial nerves-pupils were equal and reactive to light and accommodation. Visual fields were full. Extraocular movements were intact. The tongue was midline and there was no facial asymmetry. Motor strength-5 over 5 in the left upper and lower extremities. 4/5 in the right upper and lower extremities. Coordination-findings finger movements were noted to be slightly slow in the right upper extremity. Reflexes-deep tendon reflexes were somewhat diminished but symmetric. Gait and station-the patient was able to ambulate to the bathroom normally without assistance.   Disposition: The patient was told to gradually increase her activity as tolerated. She was discharged to home along with her husband. She was told not to drive for several days. She was not to return to  work until 10/14/2012. The patient was told to call Monday for a followup appointment  with her primary care physician in 7-10 days. She was told to call Monday for a followup appointment with Dr. Pearlean Brownie in one to 2 months.    Medication List     As of 09/29/2012  1:56 PM    TAKE these medications         albuterol 108 (90 BASE) MCG/ACT inhaler   Commonly known as: PROVENTIL HFA;VENTOLIN HFA   Inhale 2 puffs into the lungs every 6 (six) hours as needed. Shortness of Breath      ALEVE 220 MG tablet   Generic drug: naproxen sodium   Take 220 mg by mouth daily.      aspirin EC 81 MG tablet   Take 1 tablet (81 mg total) by mouth daily.      budesonide-formoterol 160-4.5 MCG/ACT inhaler   Commonly known as: SYMBICORT   Inhale 2 puffs into the lungs 2 (two) times daily.      cetirizine 10 MG tablet   Commonly known as: ZYRTEC   Take 10 mg by mouth daily.      escitalopram 20 MG tablet   Commonly known as: LEXAPRO   Take 20 mg by mouth daily.      gabapentin 300 MG capsule   Commonly known as: NEURONTIN   Take 600 mg by mouth daily.      lidocaine 5 %   Commonly known as: LIDODERM   Place 1 patch onto the skin daily as needed. Remove & Discard patch within 12 hours or as directed by MD. Pain      lisinopril 20 MG tablet   Commonly known as: PRINIVIL,ZESTRIL   Take 20 mg by mouth daily.      pramipexole 0.25 MG tablet   Commonly known as: MIRAPEX   Take 0.25 mg by mouth at bedtime.      simvastatin 20 MG tablet   Commonly known as: ZOCOR   Take 1 tablet (20 mg total) by mouth daily at 6 PM.         Signed: RINEHULS, DAVID L 09/29/2012, 1:56 PM

## 2012-09-29 NOTE — Progress Notes (Addendum)
Patient reported headache and feeling nauseous. Vomited x1. Zofan IV given and tylenol 650mg . Ginger ale and saltine crackers given also. Patient reports nausea subsided. Reported to Dr. Roseanne Reno, no new orders received.  Olevia Perches

## 2012-09-30 MED FILL — Ondansetron HCl Inj 4 MG/2ML (2 MG/ML): INTRAMUSCULAR | Qty: 2 | Status: AC

## 2012-09-30 NOTE — Progress Notes (Signed)
Retro ur ins review. 

## 2012-10-06 ENCOUNTER — Encounter (HOSPITAL_COMMUNITY): Payer: Self-pay | Admitting: Emergency Medicine

## 2012-10-06 ENCOUNTER — Emergency Department (HOSPITAL_COMMUNITY): Payer: BC Managed Care – PPO

## 2012-10-06 ENCOUNTER — Emergency Department (HOSPITAL_COMMUNITY)
Admission: EM | Admit: 2012-10-06 | Discharge: 2012-10-06 | Discharge status: Against Medical Advice | Payer: BC Managed Care – PPO | Attending: Emergency Medicine | Admitting: Emergency Medicine

## 2012-10-06 DIAGNOSIS — G2581 Restless legs syndrome: Secondary | ICD-10-CM | POA: Insufficient documentation

## 2012-10-06 DIAGNOSIS — Z8673 Personal history of transient ischemic attack (TIA), and cerebral infarction without residual deficits: Secondary | ICD-10-CM | POA: Insufficient documentation

## 2012-10-06 DIAGNOSIS — I10 Essential (primary) hypertension: Secondary | ICD-10-CM | POA: Insufficient documentation

## 2012-10-06 DIAGNOSIS — F329 Major depressive disorder, single episode, unspecified: Secondary | ICD-10-CM | POA: Insufficient documentation

## 2012-10-06 DIAGNOSIS — G589 Mononeuropathy, unspecified: Secondary | ICD-10-CM | POA: Insufficient documentation

## 2012-10-06 DIAGNOSIS — F3289 Other specified depressive episodes: Secondary | ICD-10-CM | POA: Insufficient documentation

## 2012-10-06 DIAGNOSIS — Z87891 Personal history of nicotine dependence: Secondary | ICD-10-CM | POA: Insufficient documentation

## 2012-10-06 DIAGNOSIS — R5381 Other malaise: Secondary | ICD-10-CM | POA: Insufficient documentation

## 2012-10-06 DIAGNOSIS — Z7982 Long term (current) use of aspirin: Secondary | ICD-10-CM | POA: Insufficient documentation

## 2012-10-06 DIAGNOSIS — J45909 Unspecified asthma, uncomplicated: Secondary | ICD-10-CM | POA: Insufficient documentation

## 2012-10-06 DIAGNOSIS — Z79899 Other long term (current) drug therapy: Secondary | ICD-10-CM | POA: Insufficient documentation

## 2012-10-06 DIAGNOSIS — R531 Weakness: Secondary | ICD-10-CM

## 2012-10-06 DIAGNOSIS — R5383 Other fatigue: Secondary | ICD-10-CM | POA: Insufficient documentation

## 2012-10-06 HISTORY — DX: Cerebral infarction, unspecified: I63.9

## 2012-10-06 LAB — CBC WITH DIFFERENTIAL/PLATELET
Hemoglobin: 14.3 g/dL (ref 12.0–15.0)
Lymphocytes Relative: 27 % (ref 12–46)
Lymphs Abs: 2.3 10*3/uL (ref 0.7–4.0)
MCH: 29.2 pg (ref 26.0–34.0)
Monocytes Relative: 7 % (ref 3–12)
Neutrophils Relative %: 64 % (ref 43–77)
Platelets: 300 10*3/uL (ref 150–400)
RBC: 4.9 MIL/uL (ref 3.87–5.11)
WBC: 8.6 10*3/uL (ref 4.0–10.5)

## 2012-10-06 LAB — COMPREHENSIVE METABOLIC PANEL
ALT: 12 U/L (ref 0–35)
Alkaline Phosphatase: 109 U/L (ref 39–117)
BUN: 15 mg/dL (ref 6–23)
CO2: 27 mEq/L (ref 19–32)
Chloride: 105 mEq/L (ref 96–112)
GFR calc Af Amer: >90 mL/min (ref >90)
GFR calc non Af Amer: >90 mL/min (ref >90)
Glucose, Bld: 88 mg/dL (ref 70–99)
Potassium: 4.4 mEq/L (ref 3.5–5.1)
Sodium: 139 mEq/L (ref 135–145)
Total Bilirubin: 0.2 mg/dL — ABNORMAL LOW (ref 0.3–1.2)

## 2012-10-06 LAB — TROPONIN I: Troponin I: <0.3 ng/mL (ref <0.30)

## 2012-10-06 NOTE — ED Provider Notes (Signed)
History  This chart was scribed for Benny Lennert, MD by Manuela Schwartz, ED scribe. This patient was seen in room APA04/APA04 and the patient's care was started at 1200.   CSN: [id number]  Arrival date & time 10/06/12  1200   None     Chief Complaint  Patient presents with  . Dizziness   Patient is a 56 y.o. female presenting with Acute Neurological Problem. The history is provided by the patient. No language interpreter was used.  Cerebrovascular Accident This is a recurrent problem. The current episode started 1 to 2 hours ago. The problem occurs constantly. The problem has not changed since onset.Pertinent negatives include no shortness of breath. Nothing aggravates the symptoms. Nothing relieves the symptoms. She has tried nothing for the symptoms.   Teresa Soto is a 56 y.o. female who presents to the Emergency Department w/hx of CVA complaining of dizziness and lightheadedness that began approximately 1-2 hours ago. Pt was seen here last week for same symptoms and diagnosed with stroke.  Past Medical History  Diagnosis Date  . Vertigo   . Hypertension   . Asthma   . Neuropathic pain   . Restless legs syndrome 2007 approx  . Depression   . Stroke     Past Surgical History  Procedure Date  . Back surgery   . Tonsillectomy   . Cesarean section     x 2  . Shoulder arthroscopy w/ rotator cuff repair     to right shoulder  . Tubal ligation   . Spine surgery 12/2010    ruptd L1 L2 , Dr Channing Mutters    History reviewed. No pertinent family history.  History  Substance Use Topics  . Smoking status: Former Games developer  . Smokeless tobacco: Not on file  . Alcohol Use: No    OB History    Grav Para Term Preterm Abortions TAB SAB Ect Mult Living                  Review of Systems  Constitutional: Negative for fever and chills.  Respiratory: Negative for shortness of breath.   Gastrointestinal: Negative for nausea and vomiting.  Neurological: Positive for dizziness and  light-headedness. Negative for weakness.  All other systems reviewed and are negative.    Allergies  Review of patient's allergies indicates no known allergies.  Home Medications   Current Outpatient Rx  Name  Route  Sig  Dispense  Refill  . ASPIRIN EC 81 MG PO TBEC   Oral   Take 1 tablet (81 mg total) by mouth daily.   150 tablet   2   . BUDESONIDE-FORMOTEROL FUMARATE 160-4.5 MCG/ACT IN AERO   Inhalation   Inhale 2 puffs into the lungs 2 (two) times daily.         Marland Kitchen CETIRIZINE HCL 10 MG PO TABS   Oral   Take 10 mg by mouth daily.           Marland Kitchen ESCITALOPRAM OXALATE 20 MG PO TABS   Oral   Take 20 mg by mouth daily.         Marland Kitchen GABAPENTIN 300 MG PO CAPS   Oral   Take 600 mg by mouth daily.          Marland Kitchen LISINOPRIL 20 MG PO TABS   Oral   Take 20 mg by mouth daily.         Marland Kitchen MELATONIN 1 MG PO TABS   Oral   Take 1 tablet  by mouth at bedtime.         Marland Kitchen NAPROXEN SODIUM 220 MG PO TABS   Oral   Take 220 mg by mouth daily.         Marland Kitchen PRAMIPEXOLE DIHYDROCHLORIDE 0.25 MG PO TABS   Oral   Take 0.25 mg by mouth at bedtime.         Marland Kitchen SIMVASTATIN 20 MG PO TABS   Oral   Take 1 tablet (20 mg total) by mouth daily at 6 PM.   30 tablet   1   . ALBUTEROL SULFATE HFA 108 (90 BASE) MCG/ACT IN AERS   Inhalation   Inhale 2 puffs into the lungs every 6 (six) hours as needed. Shortness of Breath           Triage Vitals: BP 138/94  Pulse 81  Temp 98.4 F (36.9 C) (Oral)  Resp 14  Ht 5\' 1"  (1.549 m)  Wt 165 lb (74.844 kg)  BMI 31.18 kg/m2  SpO2 98%  Physical Exam  Nursing note and vitals reviewed. Constitutional: She is oriented to person, place, and time. She appears well-developed and well-nourished. No distress.  HENT:  Head: Normocephalic and atraumatic.  Eyes: EOM are normal.  Neck: Neck supple. No tracheal deviation present.  Cardiovascular: Normal rate.   Pulmonary/Chest: Effort normal. No respiratory distress.  Musculoskeletal: Normal range of  motion.  Neurological: She is alert and oriented to person, place, and time.  Skin: Skin is warm and dry.  Psychiatric: She has a normal mood and affect. Her behavior is normal.    ED Course  Procedures (including critical care time) DIAGNOSTIC STUDIES: Oxygen Saturation is 95% on room air, adequate by my interpretation.    COORDINATION OF CARE: At 1325  Discussed treatment plan with patient which includes CXR, head CT cardiac markers, blood work, EKG. Patient agrees.   At 250 PM I discussed with patient head CT results which were negative but since she is still having symptoms similar to when she had a previous CVA, we should plan to admit her to the hospital for observation. Patient agrees.   Labs Reviewed  COMPREHENSIVE METABOLIC PANEL - Abnormal; Notable for the following:    Calcium 11.6 (*)     Total Bilirubin 0.2 (*)     All other components within normal limits  CBC WITH DIFFERENTIAL  TROPONIN I   Dg Chest 2 View  10/06/2012  *RADIOLOGY REPORT*  Clinical Data: Dizziness.  Stroke last week on the right side.  Not feeling well.  Numbness and right eye.  CHEST - 2 VIEW  Comparison: 09/27/2012  Findings: Heart size is normal.  The lungs are free of consolidations and pleural effusions.  No pulmonary edema. Visualized osseous structures have a normal appearance.  IMPRESSION: No evidence for acute cardiopulmonary abnormality.   Original Report Authenticated By: Norva Pavlov, M.D.    Ct Head Wo Contrast  10/06/2012  *RADIOLOGY REPORT*  Clinical Data: Dizziness, right-sided weakness  CT HEAD WITHOUT CONTRAST  Technique:  Contiguous axial images were obtained from the base of the skull through the vertex without contrast.  Comparison: MRI brain 09/28/2012, head CT 09/29/2012  Dizziness  Findings: No acute intracranial hemorrhage.  No focal mass lesion. No CT evidence of acute infarction.   No midline shift or mass effect.  No hydrocephalus.  Basilar cisterns are patent. Paranasal  sinuses and mastoid air cells are clear.  Orbits are normal.  IMPRESSION: No acute intercranial findings.  No change from recent  Head CT and MRI.   Original Report Authenticated By: Genevive Bi, M.D.      No diagnosis found.  Pt left ama  MDM  The chart was scribed for me under my direct supervision.  I personally performed the history, physical, and medical decision making and all procedures in the evaluation of this patient.Benny Lennert, MD 10/06/12 [phone]

## 2012-10-06 NOTE — ED Notes (Signed)
Bednar, MD notified of pt's symptoms and previous admission.

## 2012-10-06 NOTE — ED Notes (Signed)
Pt reports had stroke last week.  Pt says approx 2 hours ago started feeling dizzy and lightheaded.  Also reports mouth and tongue feel "tingly."  Also reports since last week has had difficulty writing with r hand.  Reprots the tingly feeling in mouth and the dizziness had gone away until approx 2 hours ago.  Also c/o numbness in r upper leg but says doesn't know if it is related to her back or not.  PT's grips strong and equal, strength equal in lower extremities.  Face symmetrical, and pupils equal and reactive.  Denies difficulty speaking or swallowing.

## 2012-10-06 NOTE — ED Notes (Signed)
Pt c/o dizziness and lightheadedness that began approximately 1-2 hours ago. Pt was seen here last week for same symptoms and diagnosed with stroke. Pt admitted into [facility] for 2 days and released on 12/22. Pt states symptoms were resolved at discharge. Pt is A&O x4 but states "I just don't feel well".

## 2012-10-06 NOTE — ED Notes (Signed)
Upon entering room to perform swallowing screen on patient, patient states "If he couldn't find anything wrong with me do I really need to stay." Patient informed of importance of observation to watch for any further signs of stroke, verbalized understanding but still insists that she believes she doesn't need to stay. Dr Estell Harpin made aware. Patient to sign out AMA. Dr Kerry Hough paged and notified.

## 2012-10-06 NOTE — ED Notes (Signed)
Patient and family state they do not need anything at this time. 

## 2012-10-10 ENCOUNTER — Encounter: Payer: Self-pay | Admitting: Family Medicine

## 2012-10-10 ENCOUNTER — Ambulatory Visit (INDEPENDENT_AMBULATORY_CARE_PROVIDER_SITE_OTHER): Payer: BC Managed Care – PPO | Admitting: Family Medicine

## 2012-10-10 VITALS — BP 136/82 | HR 75 | Resp 18 | Ht 61.0 in | Wt 163.0 lb

## 2012-10-10 DIAGNOSIS — J45909 Unspecified asthma, uncomplicated: Secondary | ICD-10-CM

## 2012-10-10 DIAGNOSIS — F329 Major depressive disorder, single episode, unspecified: Secondary | ICD-10-CM

## 2012-10-10 DIAGNOSIS — I6350 Cerebral infarction due to unspecified occlusion or stenosis of unspecified cerebral artery: Secondary | ICD-10-CM

## 2012-10-10 DIAGNOSIS — F3289 Other specified depressive episodes: Secondary | ICD-10-CM

## 2012-10-10 DIAGNOSIS — I639 Cerebral infarction, unspecified: Secondary | ICD-10-CM

## 2012-10-10 DIAGNOSIS — I10 Essential (primary) hypertension: Secondary | ICD-10-CM

## 2012-10-10 DIAGNOSIS — IMO0002 Reserved for concepts with insufficient information to code with codable children: Secondary | ICD-10-CM

## 2012-10-10 DIAGNOSIS — E785 Hyperlipidemia, unspecified: Secondary | ICD-10-CM

## 2012-10-10 DIAGNOSIS — Z8673 Personal history of transient ischemic attack (TIA), and cerebral infarction without residual deficits: Secondary | ICD-10-CM | POA: Insufficient documentation

## 2012-10-10 MED ORDER — ESCITALOPRAM OXALATE 20 MG PO TABS: 20.0000 mg | ORAL_TABLET | Freq: Every day | ORAL | Status: DC

## 2012-10-10 MED ORDER — PRAMIPEXOLE DIHYDROCHLORIDE 0.25 MG PO TABS: 0.2500 mg | ORAL_TABLET | Freq: Every day | ORAL | Status: DC

## 2012-10-10 MED ORDER — LISINOPRIL 10 MG PO TABS: 10.0000 mg | ORAL_TABLET | Freq: Every day | ORAL | Status: DC

## 2012-10-10 MED ORDER — SIMVASTATIN 20 MG PO TABS: 20.0000 mg | ORAL_TABLET | Freq: Every day | ORAL | Status: DC

## 2012-10-10 NOTE — Assessment & Plan Note (Signed)
Restart Lexapro discussed importance of following up on a regular basis with her chronic medical problems. She understands that she needs to follow through with her medical care

## 2012-10-10 NOTE — Assessment & Plan Note (Signed)
Continue Zocor lipid panel reviewed

## 2012-10-10 NOTE — Assessment & Plan Note (Signed)
Currently stable continue Symbicort and albuterol

## 2012-10-10 NOTE — Assessment & Plan Note (Addendum)
She will followup with her neurosurgeon for further treatment she is high tolerance for opiate medications which often do not work for her Her work indicates that she cannot pick up more than 15 pounds

## 2012-10-10 NOTE — Patient Instructions (Signed)
Restart lexapro Referral to Occupational therapy for your hand  Continue all other medications  Get eye appointment  Appt for neurologist - Dr. Pearlean Brownie  F/U 3 months

## 2012-10-10 NOTE — Assessment & Plan Note (Signed)
>>  ASSESSMENT AND PLAN FOR MAJOR DEPRESSION WRITTEN ON 10/10/2012  4:53 PM BY BARI REA F  Restart Lexapro  discussed importance of following up on a regular basis with her chronic medical problems. She understands that she needs to follow through with her medical care

## 2012-10-10 NOTE — Assessment & Plan Note (Signed)
Maximize medical therapy. She is very fortunate that she does not have a great deficit. I will send her to occupational therapy to work on fine motor concerns. We'll schedule followup with neurology in [place].

## 2012-10-10 NOTE — Progress Notes (Signed)
  Subjective:    Patient ID: Graylon Good, female    DOB: 1955/12/13, 57 y.o.   MRN: [id number]  HPI  Patient presents for hospital followup. Note she has not been seen since January 2013. She was admitted to [facility] on September 27, 2012 was diagnosed with acute left CVA was given TPA and sent to [facility] for code stroke. She has some residual right hand fine motor disturbance but otherwise doing okay. She has noticed that her vision is not as clear as previous. She was started on aspirin as well as Zocor for hyperlipidemia.  Depression/anxiety she's been out of her Lexapro for the past 3 weeks and feels very irritable. She's been snapping and wants to go back on her medication.  Asthma typically worse in the winter months. She's been using her Symbicort has not required her albuterol.  Restless leg syndrome continues to use Mirapex  Back pain has had worsening back pain of the past couple months she has scheduled an appointment with her neurosurgeon Dr. Channing Mutters. She needs a note releasing her back to work also stating that she cannot do any heavy lifting because of her back. Is having problems with a new manager regarding these restrictions   Review of Systems  - per above    GEN- denies fatigue, fever, weight loss,weakness, recent illness HEENT- denies eye drainage, change in vision, nasal discharge, CVS- denies chest pain, palpitations RESP- denies SOB, cough, wheeze ABD- denies N/V, change in stools, abd pain GU- denies dysuria, hematuria, dribbling, incontinence MSK- + joint pain, muscle aches, injury Neuro- denies headache, dizziness, syncope, seizure activity      Objective:   Physical Exam  GEN- NAD, alert and oriented x3 HEENT- PERRL, EOMI, non injected sclera, pink conjunctiva, MMM, oropharynx clear, fundoscopic exam benign , wears glasses Neck- Supple, no bruit  CVS- RRR, no murmur RESP-CTAB ABD-NABS,soft,NT,ND EXT- No edema Pulses- Radial  2+ Psych- anxious appearing, speaks loudly, not depressed appearing, no SI, no hallucinations Neuro- CNII-XII in tact, mild tremor with writing right hand, normal grasp, slower fine motor movements right hand compared to left, normal gait        Assessment & Plan:

## 2012-10-10 NOTE — Assessment & Plan Note (Addendum)
Check PTH with her next set of lab draw.

## 2012-10-10 NOTE — Assessment & Plan Note (Signed)
Continue 10 mg of lisinopril blood pressure looks good today

## 2012-10-21 ENCOUNTER — Ambulatory Visit (HOSPITAL_COMMUNITY)
Admission: RE | Admit: 2012-10-21 | Discharge: 2012-10-21 | Disposition: A | Discharge status: Home / Self Care | Payer: BC Managed Care – PPO | Source: Ambulatory Visit | Attending: Family Medicine | Admitting: Family Medicine

## 2012-10-21 DIAGNOSIS — IMO0001 Reserved for inherently not codable concepts without codable children: Secondary | ICD-10-CM | POA: Insufficient documentation

## 2012-10-21 DIAGNOSIS — I639 Cerebral infarction, unspecified: Secondary | ICD-10-CM

## 2012-10-21 DIAGNOSIS — R279 Unspecified lack of coordination: Secondary | ICD-10-CM | POA: Insufficient documentation

## 2012-10-21 DIAGNOSIS — I10 Essential (primary) hypertension: Secondary | ICD-10-CM | POA: Insufficient documentation

## 2012-10-21 NOTE — Evaluation (Signed)
Agree with above treatment plan 

## 2012-10-21 NOTE — Evaluation (Signed)
Occupational Therapy Evaluation  Patient Details  Name: Teresa Soto MRN: [id number] Date of Birth: 06-01-1956  Today's Date: 10/21/2012 Time: 0865-7846 OT Time Calculation (min): 53 min OT Evaluation 9629-5284 33' Therapeutic Exercises 1510-1530 20' Visit#: 1  of 8   Re-eval: 11/18/12  Assessment Diagnosis: Right Hand Fine Motor Deficit S/P L CVA Next MD Visit: unknown Prior Therapy: PT and ST acute care evaluation   Authorization: n/a   Past Medical History:  Past Medical History  Diagnosis Date  . Vertigo   . Hypertension   . Asthma   . Neuropathic pain   . Restless legs syndrome 2007 approx  . Depression   . Stroke    Past Surgical History:  Past Surgical History  Procedure Date  . Back surgery   . Tonsillectomy   . Cesarean section     x 2  . Shoulder arthroscopy w/ rotator cuff repair     to right shoulder  . Tubal ligation   . Spine surgery 12/2010    ruptd L1 L2 , Dr Channing Mutters    Subjective  S:  I had a stroke the Friday before Christmas. Pertinent History: Teresa Soto states that she was feeling very sick on 09/27/12.  She left work early and took herself to the ED.  She had a catscan that detected left CVA.  She was adminisited TPA and was transferred to [facility].  She was hospitalized for 3 days and dc home.  She has been referred to occupational therapy for evaluation and treatment for fine motor deficits.   Special Tests: Nine Hole Peg Test right 21.3" left 19.3" 90% I level with fine motor coordination Patient Stated Goals: I want to get my coordination back to normal.   Pain Assessment Currently in Pain?: No/denies  Precautions/Restrictions  Precautions Precautions: None  Prior Functioning  Home Living Lives With: Family Prior Function Level of Independence: Independent with basic ADLs;Independent with homemaking with ambulation Able to Take Stairs?: Yes Driving: Yes Vocation: Part time employment Vocation Requirements: Conservation officer, nature at [employer] - she  returns to work tomorrow Leisure: Hobbies-yes (Comment) Comments: enjoys reading   Assessment ADL/Vision/Perception ADL ADL Comments: Patient notes difficulty with handwriting, high level FMC activities, general fatigue Dominant Hand: Right Vision - History Baseline Vision: Wears glasses all the time  Cognition/Observation Cognition Orientation Level: Oriented X4 Memory: Impaired Memory Impairment: Decreased short term memory (patient states that she has had a STM deficit and now feels )  Sensation/Coordination/Edema Sensation Light Touch:  (median and ulnar nerve 2.83, radial nerve 3.61 diminished li) Coordination Gross Motor Movements are Fluid and Coordinated: Yes 9 Hole Peg Test: right 21.3" left 19.3"  Additional Assessments RUE AROM (degrees) RUE Overall AROM Comments: AROM is WNL RUE Strength RUE Overall Strength Comments: strength is 5/5 Grip (lbs): 55 (left 60 ) Lateral Pinch: 16 lbs (left 18) 3 Point Pinch: 12 lbs (left 12) Right Hand Strength - Pinch (lbs) Lateral Pinch: 16 lbs (left 18) 3 Point Pinch: 12 lbs (left 12)   Exercise/Treatments Hand Exercises Theraputty: Flatten;Roll;Grip;Locate Pegs Theraputty - Flatten: pink Theraputty - Roll: pink Theraputty - Grip: pink Theraputty - Locate Pegs: 10 beads Fine Motor Coordination: In hand manipuation training In Hand Manipulation Training: translation from pincer grasp to palm and palm back to pincer grasp with minimal difficulty        Occupational Therapy Assessment and Plan OT Assessment and Plan Clinical Impression Statement: A:  57 year old female with past medical history that includes right rotator  cuff repair, back surgery, and R CTS presents with decreased right hand strength, coordination, and sensation s/p left cva on 09/27/12.  Patient will benefit from skilled OT intervention to increase right hand strength, coordination, and sensation to WNL for return to full level of I with all B/IADL, work,  and leisure activities.  Patient is also complaining of increased short term memory deficits and would benefit from a speech therapy evaluation.   Pt will benefit from skilled therapeutic intervention in order to improve on the following deficits: Decreased coordination;Impaired sensation;Decreased strength Rehab Potential: Excellent OT Frequency: Min 2X/week OT Duration: 4 weeks OT Treatment/Interventions: Self-care/ADL training;Therapeutic exercise;Neuromuscular education;Therapeutic activities;Patient/family education OT Plan: P:  Skilled OT intervention 2 times a week x 4 weeks to increase right hand strength, coordination, and sensation to WNL for increased I with handwriting, typing, fine motor tasks, B/IADLs, work, and leisure activities.  Treatment Plan:  Handwriting drills, typing drills, grip and pinch strengthening with tputty, sponges, hand gripper, digitcizer.  Fine motor coordination training.  Sensory reintegration exercises.   Goals Short Term Goals Time to Complete Short Term Goals: 2 weeks Short Term Goal 1: Patient will be educated on a HEP. Short Term Goal 2: Patient will increase right grip strength by 5 pounds and pinch strength by 1 pound for increased ability to open containers and pick up items at work. Short Term Goal 3: Patient will increase fine motor coordination by decreasing completion time on nine hole peg test by 2 seconds.   Short Term Goal 4: Patient will increase writing legibility from good to good +. Short Term Goal 5: Patient will increase radial nerve sensation from diminished light touch to normal sensation for increased safety with daily activities.  Long Term Goals Time to Complete Long Term Goals: 4 weeks Long Term Goal 1: Patient will return to prior level of I with all B/IADL, work, and leisure activities. Long Term Goal 2: Patient will increase her right grip strength to 65 pounds and lateral pinch strength to 14 pounds for increased ability to  maintain grasp on objects at work. Long Term Goal 3: Patient will increase her fine motor coordination to WNL by completing the nine hole peg test in 18 seconds or less. Long Term Goal 4: Patient will have normal fluidity and sensation with handwriting and typing. Long Term Goal 5: Patient will have good + sustained activity tolerance in order to complete a normal shift at work.   Problem List Patient Active Problem List  Diagnosis  . HYPERLIPIDEMIA  . DEPRESSION  . TINNITUS, RIGHT  . HYPERTENSION  . ASTHMA  . DEGENERATIVE JOINT DISEASE  . ARTHRITIS  . SCIATICA, LEFT  . BACK PAIN, LUMBAR, WITH RADICULOPATHY  . VERTIGO  . INSOMNIA  . FATIGUE  . PERIPHERAL EDEMA  . Hearing loss  . Carotid bruit  . Vitamin d deficiency  . Hypercalcemia  . CVA (cerebral infarction)  . Lack of coordination    End of Session Activity Tolerance: Patient tolerated treatment well General Behavior During Session: [facility] for tasks performed Cognition: [facility] for tasks performed OT Plan of Care OT Home Exercise Plan: Educated patient on a HEP for grip and pinch strengthening with light resist tputty, right hand typing exercises, and fine motor coordination exercises.  GO    Shirlean Mylar, OTR/L  10/21/2012, 4:26 PM  Physician Documentation Your signature is required to indicate approval of the treatment plan as stated above.  Please sign and either send electronically or make a copy  of this report for your files and return this physician signed original.  Please mark one 1.__approve of plan  2. ___approve of plan with the following conditions.   ______________________________                                                          _____________________ Physician Signature                                                                                                             Date

## 2012-10-24 ENCOUNTER — Inpatient Hospital Stay (HOSPITAL_COMMUNITY): Admission: RE | Admit: 2012-10-24 | Payer: BC Managed Care – PPO | Source: Ambulatory Visit | Admitting: Specialist

## 2012-10-25 ENCOUNTER — Ambulatory Visit (HOSPITAL_COMMUNITY)
Admission: RE | Admit: 2012-10-25 | Discharge: 2012-10-25 | Disposition: A | Discharge status: Home / Self Care | Payer: BC Managed Care – PPO | Source: Ambulatory Visit | Attending: Family Medicine | Admitting: Family Medicine

## 2012-10-25 DIAGNOSIS — R279 Unspecified lack of coordination: Secondary | ICD-10-CM

## 2012-10-25 NOTE — Progress Notes (Signed)
Occupational Therapy Treatment Patient Details  Name: Teresa Soto MRN: [id number] Date of Birth: 1955-10-25  Today's Date: 10/25/2012 Time: 4098-1191 OT Time Calculation (min): 40 min Neuroreeducation 40' Visit#: 2  of 8   Re-eval: 11/18/12     Subjective S:  I dont feel well today.   Limitations: Discussed patients general lack of schedule, lack of energy, lack of initiation.  Discussed starting a walking program, volunteering, etc.  Precautions/Restrictions   progress as tolerated  Exercise/Treatments Hand Exercises Theraputty: Flatten;Roll;Grip;Locate Pegs Theraputty - Flatten: pink Theraputty - Roll: pink Theraputty - Grip: pink Theraputty - Locate Pegs: 15 beads Hand Gripper with Large Beads: 7 Sponges: 10, 11, 12 Small Pegboard: grooved pegboard with  Fine Motor Coordination: In hand manipuation training In Hand Manipulation Training: translation from pincer grasp to palm and palm back to pincer grasp with minimal difficulty Other Hand Exercises: proximal shoulder strengthening 10 reps of each exercise without resting between exercises x 2 sets Other Hand Exercises: handwriting activity of tracing a curved and jagged line x 2 each line for increased percision and accuracy with handwriting.        Occupational Therapy Assessment and Plan OT Assessment and Plan Clinical Impression Statement: A: Patient with increased coordination during writing task this date. OT Plan: P:  Add 1# to proximal shoulder strengthening activity and add UBE.   Goals Short Term Goals Time to Complete Short Term Goals: 2 weeks Short Term Goal 1: Patient will be educated on a HEP. Short Term Goal 1 Progress: Progressing toward goal Short Term Goal 2: Patient will increase right grip strength by 5 pounds and pinch strength by 1 pound for increased ability to open containers and pick up items at work. Short Term Goal 2 Progress: Progressing toward goal Short Term Goal 3: Patient will  increase fine motor coordination by decreasing completion time on nine hole peg test by 2 seconds.   Short Term Goal 3 Progress: Progressing toward goal Short Term Goal 4: Patient will increase writing legibility from good to good +. Short Term Goal 4 Progress: Progressing toward goal Short Term Goal 5: Patient will increase radial nerve sensation from diminished light touch to normal sensation for increased safety with daily activities.  Short Term Goal 5 Progress: Progressing toward goal Long Term Goals Time to Complete Long Term Goals: 4 weeks Long Term Goal 1: Patient will return to prior level of I with all B/IADL, work, and leisure activities. Long Term Goal 1 Progress: Progressing toward goal Long Term Goal 2: Patient will increase her right grip strength to 65 pounds and lateral pinch strength to 14 pounds for increased ability to maintain grasp on objects at work. Long Term Goal 2 Progress: Progressing toward goal Long Term Goal 3: Patient will increase her fine motor coordination to WNL by completing the nine hole peg test in 18 seconds or less. Long Term Goal 3 Progress: Progressing toward goal Long Term Goal 4: Patient will have normal fluidity and sensation with handwriting and typing. Long Term Goal 4 Progress: Progressing toward goal Long Term Goal 5: Patient will have good + sustained activity tolerance in order to complete a normal shift at work.  Long Term Goal 5 Progress: Progressing toward goal  Problem List Patient Active Problem List  Diagnosis  . HYPERLIPIDEMIA  . DEPRESSION  . TINNITUS, RIGHT  . HYPERTENSION  . ASTHMA  . DEGENERATIVE JOINT DISEASE  . ARTHRITIS  . SCIATICA, LEFT  . BACK PAIN, LUMBAR, WITH RADICULOPATHY  .  VERTIGO  . INSOMNIA  . FATIGUE  . PERIPHERAL EDEMA  . Hearing loss  . Carotid bruit  . Vitamin d deficiency  . Hypercalcemia  . CVA (cerebral infarction)  . Lack of coordination    End of Session Activity Tolerance: Patient  tolerated treatment well General Behavior During Session: [facility] for tasks performed Cognition: [facility] for tasks performed  GO    Shirlean Mylar, OTR/L  10/25/2012, 2:10 PM

## 2012-10-28 ENCOUNTER — Inpatient Hospital Stay (HOSPITAL_COMMUNITY): Admission: RE | Admit: 2012-10-28 | Payer: BC Managed Care – PPO | Source: Ambulatory Visit

## 2012-10-29 ENCOUNTER — Ambulatory Visit (HOSPITAL_COMMUNITY)
Admission: RE | Admit: 2012-10-29 | Discharge: 2012-10-29 | Disposition: A | Discharge status: Home / Self Care | Payer: BC Managed Care – PPO | Source: Ambulatory Visit | Attending: Family Medicine | Admitting: Family Medicine

## 2012-10-29 DIAGNOSIS — R279 Unspecified lack of coordination: Secondary | ICD-10-CM

## 2012-10-29 NOTE — Progress Notes (Signed)
Occupational Therapy Treatment Patient Details  Name: Teresa Soto MRN: [id number] Date of Birth: [birth year]  Today's Date: 10/29/2012 Time: 4098-1191 OT Time Calculation (min): 45 min Theract 845-930 45'  Visit#: 3  of 8   Re-eval: 11/18/12    Authorization: n/a  Authorization Time Period:    Authorization Visit#:   of    Subjective Symptoms/Limitations Symptoms: S: I'm pretty sure I've lost almost all feeling in my right hand. Pain Assessment Currently in Pain?: No/denies  Precautions/Restrictions  Precautions Precautions: None  Exercise/Treatments Hand Exercises Theraputty: Flatten;Roll;Grip;Locate Pegs Theraputty - Flatten: pink Theraputty - Roll: pink Theraputty - Grip: pink Theraputty - Pinch: pink Theraputty - Locate Pegs: 15 beads Hand Gripper with Medium Beads: 19 Sponges: 12,11,12 Small Pegboard: grooved pegboard  Fine Motor Coordination: In hand manipuation training Other Hand Exercises: proximal shoulder strengthening; 10 reps; 1#; 2 sets; no rest breaks        Occupational Therapy Assessment and Plan OT Assessment and Plan Clinical Impression Statement: A: Educated patient to perform in hand manipulation task with coins at home to increase functional performance during work tasks. 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   Goals Short Term Goals Time to Complete Short Term Goals: 2 weeks Short Term Goal 1: Patient will be educated on a HEP. Short Term Goal 1 Progress: Met Short Term Goal 2: Patient will increase right grip strength by 5 pounds and pinch strength by 1 pound for increased ability to open containers and pick up items at work. Short Term Goal 2 Progress: Progressing toward goal Short Term Goal 3: Patient will increase fine motor coordination by decreasing completion time on nine hole peg test by 2 seconds.   Short Term Goal 3 Progress: Progressing toward goal Short Term Goal 4: Patient will increase writing  legibility from good to good +. Short Term Goal 4 Progress: Progressing toward goal Short Term Goal 5: Patient will increase radial nerve sensation from diminished light touch to normal sensation for increased safety with daily activities.  Short Term Goal 5 Progress: Progressing toward goal Long Term Goals Time to Complete Long Term Goals: 4 weeks Long Term Goal 1: Patient will return to prior level of I with all B/IADL, work, and leisure activities. Long Term Goal 1 Progress: Progressing toward goal Long Term Goal 2: Patient will increase her right grip strength to 65 pounds and lateral pinch strength to 14 pounds for increased ability to maintain grasp on objects at work. Long Term Goal 2 Progress: Progressing toward goal Long Term Goal 3: Patient will increase her fine motor coordination to WNL by completing the nine hole peg test in 18 seconds or less. Long Term Goal 3 Progress: Progressing toward goal Long Term Goal 4: Patient will have normal fluidity and sensation with handwriting and typing. Long Term Goal 4 Progress: Progressing toward goal Long Term Goal 5: Patient will have good + sustained activity tolerance in order to complete a normal shift at work.  Long Term Goal 5 Progress: Progressing toward goal  Problem List Patient Active Problem List  Diagnosis  . HYPERLIPIDEMIA  . DEPRESSION  . TINNITUS, RIGHT  . HYPERTENSION  . ASTHMA  . DEGENERATIVE JOINT DISEASE  . ARTHRITIS  . SCIATICA, LEFT  . BACK PAIN, LUMBAR, WITH RADICULOPATHY  . VERTIGO  . INSOMNIA  . FATIGUE  . PERIPHERAL EDEMA  . Hearing loss  . Carotid bruit  . Vitamin d deficiency  . Hypercalcemia  . CVA (cerebral infarction)  .  Lack of coordination    End of Session Activity Tolerance: Patient tolerated treatment well General Behavior During Session: [facility] for tasks performed Cognition: [facility] for tasks performed   Limmie Patricia, OTR/L 10/29/2012, 10:00 AM

## 2012-11-01 ENCOUNTER — Ambulatory Visit (HOSPITAL_COMMUNITY)
Admission: RE | Admit: 2012-11-01 | Discharge: 2012-11-01 | Disposition: A | Discharge status: Home / Self Care | Payer: BC Managed Care – PPO | Source: Ambulatory Visit | Attending: Family Medicine | Admitting: Family Medicine

## 2012-11-01 DIAGNOSIS — R279 Unspecified lack of coordination: Secondary | ICD-10-CM

## 2012-11-01 NOTE — Progress Notes (Signed)
Occupational Therapy Treatment Patient Details  Name: Teresa Soto MRN: [id number] Date of Birth: 1955/11/21  Today's Date: 11/01/2012 Time: 0865-7846 OT Time Calculation (min): 38 min Theract [phone] 38'  Visit#: 4  of 8   Re-eval: 11/18/12    Authorization: n/a  Authorization Time Period:    Authorization Visit#:   of    Subjective Symptoms/Limitations Symptoms: S: I found out yesterday that I have to get a MRI on my spinal and I may have to have spinal surgery. 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 Minta feels like that is causing the most problems now versus her stroke. 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   Goals Short Term Goals Time to Complete Short Term Goals: 2 weeks Short Term Goal 1: Patient will be educated on a HEP. Short Term Goal 2: Patient will increase right grip strength by 5 pounds and pinch strength by 1 pound for increased ability to open containers and pick up items at work. Short Term Goal 3: Patient will increase fine motor coordination by decreasing completion time on nine hole peg test by 2 seconds.   Short Term Goal 4: Patient will increase writing legibility from good to good +. Short Term Goal 5: Patient will increase radial nerve sensation from diminished light touch to normal sensation for increased safety with  daily activities.  Long Term Goals Time to Complete Long Term Goals: 4 weeks Long Term Goal 1: Patient will return to prior level of I with all B/IADL, work, and leisure activities. Long Term Goal 2: Patient will increase her right grip strength to 65 pounds and lateral pinch strength to 14 pounds for increased ability to maintain grasp on objects at work. Long Term Goal 3: Patient will increase her fine motor coordination to WNL by completing the nine hole peg test in 18 seconds or less. Long Term Goal 4: Patient will have normal fluidity and sensation with handwriting and typing. Long Term Goal 5: Patient will have good + sustained activity tolerance in order to complete a normal shift at work.   Problem List Patient Active Problem List  Diagnosis  . HYPERLIPIDEMIA  . DEPRESSION  . TINNITUS, RIGHT  . HYPERTENSION  . ASTHMA  . DEGENERATIVE JOINT DISEASE  . ARTHRITIS  . SCIATICA, LEFT  . BACK PAIN, LUMBAR, WITH RADICULOPATHY  . VERTIGO  . INSOMNIA  . FATIGUE  . PERIPHERAL EDEMA  . Hearing loss  . Carotid bruit  . Vitamin d deficiency  . Hypercalcemia  . CVA (cerebral infarction)  . Lack of coordination    End of Session Activity Tolerance: Patient tolerated treatment well General Behavior During Session: [facility] for tasks performed Cognition: [facility] for tasks performed   Limmie Patricia, OTR/L 11/01/2012, 10:21 AM

## 2012-11-05 ENCOUNTER — Ambulatory Visit (HOSPITAL_COMMUNITY)
Admission: RE | Admit: 2012-11-05 | Discharge: 2012-11-05 | Disposition: A | Discharge status: Home / Self Care | Payer: BC Managed Care – PPO | Source: Ambulatory Visit | Attending: Family Medicine | Admitting: Family Medicine

## 2012-11-05 DIAGNOSIS — R279 Unspecified lack of coordination: Secondary | ICD-10-CM

## 2012-11-05 NOTE — Progress Notes (Signed)
Occupational Therapy Treatment Patient Details  Name: Teresa Soto MRN: [id number] Date of Birth: [birth year]  Today's Date: 11/05/2012 Time: 7829-5621 OT Time Calculation (min): 38 min Theract [id number] 38'  Visit#: 5  of 8   Re-eval: 11/18/12    Authorization: n/a  Authorization Time Period:    Authorization Visit#:   of    Subjective Symptoms/Limitations Symptoms: S: My thumb is really hurting me bad today. Pain Assessment Currently in Pain?: Yes Pain Score:   8 Pain Location: Hand (bilateral hands) Pain Orientation: Right;Left Pain Type: Chronic pain  Precautions/Restrictions   None  Exercise/Treatments Hand Exercises Theraputty: Flatten;Roll;Grip;Locate Pegs Theraputty - Flatten: pink Theraputty - Roll: pink Theraputty - Grip: pink Theraputty - Pinch: pink Other Hand Exercises: Typing paragraph in [employer]; 4 errors; 3'33" Other Hand Exercises: handwriting task; paragraph in [employer]; legibility good+        Occupational Therapy Assessment and Plan OT Assessment and Plan Clinical Impression Statement: A: Completed nine hole peg test; L: 16.93 R: 18.75 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   Goals Short Term Goals Time to Complete Short Term Goals: 2 weeks Short Term Goal 1: Patient will be educated on a HEP. Short Term Goal 2: Patient will increase right grip strength by 5 pounds and pinch strength by 1 pound for increased ability to open containers and pick up items at work. Short Term Goal 2 Progress: Progressing toward goal Short Term Goal 3: Patient will increase fine motor coordination by decreasing completion time on nine hole peg test by 2 seconds.   Short Term Goal 3 Progress: Met Short Term Goal 4: Patient will increase writing legibility from good to good +. Short Term Goal 4 Progress: Met Short Term Goal 5: Patient will increase radial nerve sensation from diminished light touch to normal sensation for  increased safety with daily activities.  Short Term Goal 5 Progress: Progressing toward goal Long Term Goals Time to Complete Long Term Goals: 4 weeks Long Term Goal 1: Patient will return to prior level of I with all B/IADL, work, and leisure activities. Long Term Goal 1 Progress: Progressing toward goal Long Term Goal 2: Patient will increase her right grip strength to 65 pounds and lateral pinch strength to 14 pounds for increased ability to maintain grasp on objects at work. Long Term Goal 2 Progress: Progressing toward goal Long Term Goal 3: Patient will increase her fine motor coordination to WNL by completing the nine hole peg test in 18 seconds or less. Long Term Goal 3 Progress: Progressing toward goal Long Term Goal 4: Patient will have normal fluidity and sensation with handwriting and typing. Long Term Goal 4 Progress: Progressing toward goal Long Term Goal 5: Patient will have good + sustained activity tolerance in order to complete a normal shift at work.  Long Term Goal 5 Progress: Progressing toward goal  Problem List Patient Active Problem List  Diagnosis  . HYPERLIPIDEMIA  . DEPRESSION  . TINNITUS, RIGHT  . HYPERTENSION  . ASTHMA  . DEGENERATIVE JOINT DISEASE  . ARTHRITIS  . SCIATICA, LEFT  . BACK PAIN, LUMBAR, WITH RADICULOPATHY  . VERTIGO  . INSOMNIA  . FATIGUE  . PERIPHERAL EDEMA  . Hearing loss  . Carotid bruit  . Vitamin d deficiency  . Hypercalcemia  . CVA (cerebral infarction)  . Lack of coordination    End of Session Activity Tolerance: Patient tolerated treatment well General Behavior During Session: [facility] for tasks performed  Cognition: [facility] for tasks performed   Limmie Patricia, OTR/L 11/05/2012, 1:56 PM

## 2012-11-07 ENCOUNTER — Inpatient Hospital Stay (HOSPITAL_COMMUNITY): Admission: RE | Admit: 2012-11-07 | Payer: BC Managed Care – PPO | Source: Ambulatory Visit | Admitting: Specialist

## 2012-12-05 ENCOUNTER — Ambulatory Visit (HOSPITAL_COMMUNITY)
Admission: RE | Admit: 2012-12-05 | Discharge: 2012-12-05 | Disposition: A | Discharge status: Home / Self Care | Payer: BC Managed Care – PPO | Source: Ambulatory Visit | Attending: Family Medicine | Admitting: Family Medicine

## 2012-12-05 ENCOUNTER — Telehealth (HOSPITAL_COMMUNITY): Payer: Self-pay | Admitting: Physical Therapy

## 2012-12-05 DIAGNOSIS — IMO0001 Reserved for inherently not codable concepts without codable children: Secondary | ICD-10-CM | POA: Insufficient documentation

## 2012-12-05 DIAGNOSIS — R279 Unspecified lack of coordination: Secondary | ICD-10-CM | POA: Insufficient documentation

## 2012-12-05 DIAGNOSIS — R29898 Other symptoms and signs involving the musculoskeletal system: Secondary | ICD-10-CM | POA: Insufficient documentation

## 2012-12-05 DIAGNOSIS — I10 Essential (primary) hypertension: Secondary | ICD-10-CM | POA: Insufficient documentation

## 2012-12-05 NOTE — Evaluation (Signed)
Physical Therapy Evaluation  Patient Details  Name: Teresa Soto MRN: [id number] Date of Birth: [birth year]  Today's Date: 12/05/2012 Time: 0942-1015 PT Time Calculation (min): 33 min  Visit#: 1 of 1  Re-eval:   Assessment Diagnosis:  (CVA) Prior Therapy: OT  Authorization:   BCBS Past Medical History:  Past Medical History  Diagnosis Date  . Vertigo   . Hypertension   . Asthma   . Neuropathic pain   . Restless legs syndrome 2007 approx  . Depression   . Stroke    Past Surgical History:  Past Surgical History  Procedure Laterality Date  . Back surgery    . Tonsillectomy    . Cesarean section      x 2  . Shoulder arthroscopy w/ rotator cuff repair      to right shoulder  . Tubal ligation    . Spine surgery  12/2010    ruptd L1 L2 , Dr Teresa Soto    Subjective Symptoms/Limitations Symptoms: Teresa. Teresa Soto had a L CVA on 09/27/2012.  She was admitted to [facility] and discharged on 09/29/13.  Teresa Soto states that she has been working at [employer] and nots that the thing she is having the most difficulty with is picking up the money.  She denies weakness in her LE or any balance issues.  She has been referred to PT to evaluate her strength and balance. How long can you sit comfortably?: no problem How long can you stand comfortably?: no problem How long can you walk comfortably?: no problem Pain Assessment Currently in Pain?: Yes Pain Score:   2 (pain comes and goes ) Pain Location: Hip Pain Orientation: Left;Posterior Pain Type: Chronic pain  Assessment RLE Strength Right Hip Flexion: 5/5 Right Hip Extension: 3+/5 Right Hip ABduction: 4/5 Right Hip ADduction: 5/5 Right Knee Flexion: 4/5 Right Knee Extension: 4/5 Right Ankle Dorsiflexion: 5/5  Exercise/Treatments Mobility/Balance  Berg Balance Test Sit to Stand: Able to stand without using hands and stabilize independently Standing Unsupported: Able to stand safely 2 minutes Sitting with Back Unsupported but Feet  Supported on Floor or Stool: Able to sit safely and securely 2 minutes Stand to Sit: Sits safely with minimal use of hands Transfers: Able to transfer safely, minor use of hands Standing Unsupported with Eyes Closed: Able to stand 10 seconds safely Standing Ubsupported with Feet Together: Able to place feet together independently and stand 1 minute safely From Standing, Reach Forward with Outstretched Arm: Can reach confidently >25 cm (10") From Standing Position, Pick up Object from Floor: Able to pick up shoe safely and easily From Standing Position, Turn to Look Behind Over each Shoulder: Looks behind from both sides and weight shifts well Turn 360 Degrees: Able to turn 360 degrees safely in 4 seconds or less Standing Unsupported, Alternately Place Feet on Step/Stool: Able to stand independently and safely and complete 8 steps in 20 seconds Standing Unsupported, One Foot in Front: Able to place foot tandem independently and hold 30 seconds Standing on One Leg: Able to lift leg independently and hold > 10 seconds Total Score: 56     Standing Heel Raises: 10 reps;Limitations Heel Raises Limitations: R only SLS:  (3 x 10 sec) Seated Long Arc Quad: 10 reps Supine   Sidelying Hip ABduction: 10 reps Prone  Hamstring Curl: 10 reps;Limitations Hamstring Curl Limitations: 5 # Hip Extension: 10 reps    Physical Therapy Assessment and Plan PT Assessment and Plan Clinical Impression Statement: functionally pt has not deficits  but does demonstrate weakness in R LE with manual mm testing.  PT wll be given a HEP to work on improving LE strength on own at home. PT Plan: HEP given    Goals Home Exercise Program Pt will Perform Home Exercise Program: Independently  Problem List Patient Active Problem List  Diagnosis  . HYPERLIPIDEMIA  . DEPRESSION  . TINNITUS, RIGHT  . HYPERTENSION  . ASTHMA  . DEGENERATIVE JOINT DISEASE  . ARTHRITIS  . SCIATICA, LEFT  . BACK PAIN, LUMBAR, WITH  RADICULOPATHY  . VERTIGO  . INSOMNIA  . FATIGUE  . PERIPHERAL EDEMA  . Hearing loss  . Carotid bruit  . Vitamin d deficiency  . Hypercalcemia  . CVA (cerebral infarction)  . Lack of coordination  . Weakness of right leg    PT Plan of Care PT Home Exercise Plan: given  GP    Teresa Soto 12/05/2012, 10:46 AM  Physician Documentation Your signature is required to indicate approval of the treatment plan as stated above.  Please sign and either send electronically or make a copy of this report for your files and return this physician signed original.   Please mark one 1.__approve of plan  2. ___approve of plan with the following conditions.   ______________________________                                                          _____________________ Physician Signature                                                                                                             Date

## 2012-12-05 NOTE — Evaluation (Addendum)
Speech Language Pathology Evaluation Patient Details  Name: Teresa Soto MRN: [id number] Date of Birth: 09-05-1956  Today's Date: 12/05/2012 Time: 1020-1135 SLP Time Calculation (min): 75 min  Authorization: BCBS  Authorization Time Period:    Authorization Visit#:   of     Past Medical History:  Past Medical History  Diagnosis Date  . Vertigo   . Hypertension   . Asthma   . Neuropathic pain   . Restless legs syndrome 2007 approx  . Depression   . Stroke    Past Surgical History:  Past Surgical History  Procedure Laterality Date  . Back surgery    . Tonsillectomy    . Cesarean section      x 2  . Shoulder arthroscopy w/ rotator cuff repair      to right shoulder  . Tubal ligation    . 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 She is extremely knowledgeable about external memory strategies and implements them daily.  Comprehension  Auditory Comprehension Overall Auditory Comprehension: Appears  within functional limits for tasks assessed Yes/No Questions: Within Functional Limits Commands: Within Functional Limits Visual Recognition/Discrimination Discrimination: Within Function Limits Reading Comprehension Reading Status: Within funtional limits  Expression  Expression Primary Mode of Expression: Verbal Verbal Expression Overall Verbal Expression: Appears within functional limits for tasks assessed Initiation: No impairment Level of Generative/Spontaneous Verbalization: Conversation Repetition: No impairment Naming: No impairment (Pt reports word finding difficulties occasionally in convers) Pragmatics: No impairment Non-Verbal Means of Communication: Not applicable Written Expression Dominant Hand: Right Written Expression: Not tested  Oral/Motor  Oral Motor/Sensory Function Overall Oral Motor/Sensory Function: Appears within functional limits for tasks assessed Motor Speech Overall Motor Speech: Appears within functional limits for tasks assessed Respiration: Within functional limits Phonation: Normal Resonance: Within functional limits Articulation: Within functional limitis Intelligibility: Intelligible Motor Planning: Witnin functional limits Motor Speech Errors: Not applicable  SLP Goals   N/A  Assessment/Plan  Patient Active Problem List  Diagnosis  . HYPERLIPIDEMIA  . DEPRESSION  . TINNITUS, RIGHT  . HYPERTENSION  . ASTHMA  . DEGENERATIVE JOINT DISEASE  . ARTHRITIS  . SCIATICA, LEFT  . BACK PAIN, LUMBAR, WITH RADICULOPATHY  . VERTIGO  . INSOMNIA  . FATIGUE  . PERIPHERAL EDEMA  . Hearing loss  . Carotid bruit  . Vitamin d deficiency  . Hypercalcemia  . CVA (cerebral infarction)  . Lack of coordination  . Weakness of right leg   SLP - End of Session Activity Tolerance: Patient tolerated treatment well General Behavior During Session: [facility] for tasks performed Cognition: [facility] for tasks performed  SLP Assessment/Plan Clinical  Impression Statement: [name] is a 57 yo woman s/p CVA  in December who was referred for cognitive linguisitc evaluation a month ago by her occupational therapist who noted memory deficits. [name] that she has always had "memory problems", but felt like they were worse after the stroke. She now feels like she is back to her baseline status except for high level word retrieval difficulties occasionally in conversation. Although she was given additional memory strategies and recommendations, she already has a system in place that functions very well for her. She writes everything in her planner and color codes to her specifications. She reports no difficulty at work except for some fine motor movements in her right hand. No further speech/language services are recommended at this time. She should continue to implement the strategies already in place. Her strength appears to be visual memory and she benefits from writing down auditory information.  Thank you,  Teresa Soto, CCC-SLP [phone]     Teresa Soto 12/05/2012, 3:13 PM  Physician Documentation Your signature is required to indicate approval of the treatment plan as stated above.  Please sign and either send electronically or make a copy of this report for your files and return this physician signed original.  Please mark one 1.__approve of plan  2. ___approve of plan with the following conditions.   ______________________________                                                          _____________________ Physician Signature                                                                                                             Date

## 2012-12-05 NOTE — Evaluation (Signed)
Agree with above 

## 2012-12-09 ENCOUNTER — Ambulatory Visit (HOSPITAL_COMMUNITY)
Admission: RE | Admit: 2012-12-09 | Discharge: 2012-12-09 | Disposition: A | Discharge status: Home / Self Care | Payer: BC Managed Care – PPO | Source: Ambulatory Visit | Attending: Family Medicine | Admitting: Family Medicine

## 2012-12-09 DIAGNOSIS — R279 Unspecified lack of coordination: Secondary | ICD-10-CM | POA: Insufficient documentation

## 2012-12-09 DIAGNOSIS — I10 Essential (primary) hypertension: Secondary | ICD-10-CM | POA: Insufficient documentation

## 2012-12-09 DIAGNOSIS — IMO0001 Reserved for inherently not codable concepts without codable children: Secondary | ICD-10-CM | POA: Insufficient documentation

## 2012-12-09 NOTE — Evaluation (Signed)
Physical Therapy Evaluation  Patient Details  Name: Teresa Soto MRN: [id number] Date of Birth: 1955/12/18 eval Today's Date: 12/09/2012 Time: 0940-1019 PT Time Calculation (min): 39 min  Visit#: 1 of 4  Re-eval: 01/06/13 Assessment Diagnosis:  (CVA) Prior Therapy: OT  Authorization: BCBS  Authorization Time Period:    Authorization Visit#:   of     Past Medical History:  Past Medical History  Diagnosis Date  . Vertigo   . Hypertension   . Asthma   . Neuropathic pain   . Restless legs syndrome 2007 approx  . Depression   . Stroke    Past Surgical History:  Past Surgical History  Procedure Laterality Date  . Back surgery    . Tonsillectomy    . Cesarean section      x 2  . Shoulder arthroscopy w/ rotator cuff repair      to right shoulder  . Tubal ligation    . Spine surgery  12/2010    ruptd L1 L2 , Dr Channing Mutters    Subjective Symptoms/Limitations Symptoms: Teresa Soto states that she has L sciatic pain.  She states that she had surgery in 03/2011.  She states that the surgery helped her radicular pain but she still has pain in her left buttock.  The patient also states that she hs numbness in both of her thighs.  Back pocket pain comes and goes. Pain Assessment Currently in Pain?: No/denies    Assessment RLE Strength Right Hip Flexion: 5/5 Right Hip Extension: 3+/5 Right Hip ABduction: 4/5 Right Hip ADduction: 5/5 Right Knee Flexion: 4/5 Right Knee Extension: 4/5 Right Ankle Dorsiflexion: 5/5 Lumbar AROM Overall Lumbar AROM Comments: wnl but L SB is tight;  Exercise/Treatments Mobility/Balance  Berg Balance Test Sit to Stand: Able to stand without using hands and stabilize independently Standing Unsupported: Able to stand safely 2 minutes Sitting with Back Unsupported but Feet Supported on Floor or Stool: Able to sit safely and securely 2 minutes Stand to Sit: Sits safely with minimal use of hands Transfers: Able to transfer safely, minor use of  hands Standing Unsupported with Eyes Closed: Able to stand 10 seconds safely Standing Ubsupported with Feet Together: Able to place feet together independently and stand 1 minute safely From Standing, Reach Forward with Outstretched Arm: Can reach confidently >25 cm (10") From Standing Position, Pick up Object from Floor: Able to pick up shoe safely and easily From Standing Position, Turn to Look Behind Over each Shoulder: Looks behind from both sides and weight shifts well Turn 360 Degrees: Able to turn 360 degrees safely in 4 seconds or less Standing Unsupported, Alternately Place Feet on Step/Stool: Able to stand independently and safely and complete 8 steps in 20 seconds Standing Unsupported, One Foot in Front: Able to place foot tandem independently and hold 30 seconds Standing on One Leg: Able to lift leg independently and hold > 10 seconds Total Score: 56   Stretches Active Hamstring Stretch: 3 reps;30 seconds Piriformis Stretch: 1 rep;60 seconds Supine Ab Set: 5 reps;3 seconds Clam: 10 reps Bent Knee Raise: 10 reps Bridge: 10 reps Straight Leg Raise: 5 reps    Physical Therapy Assessment and Plan PT Assessment and Plan Clinical Impression Statement: Pt with chronic sciatica who will benefit from skilled PT for instruction in stretching as well as stability exercises to improve pain. PT Frequency: Min 1X/week PT Duration: 4 weeks PT Treatment/Interventions: Therapeutic exercise PT Plan: PT given initial lumbar stab and stretching exercises progress to standing postural  T-band as well as SL abduction; third treatment begin prone SAR, SLR oppostite arm/leg; fourth treament show all four and plank;  Pt will need new HEP with each treatment.    Goals Home Exercise Program Pt will Perform Home Exercise Program: Independently PT Long Term Goals Time to Complete Long Term Goals: 4 weeks PT Long Term Goal 1: I in advance HEP PT Long Term Goal 2: No hip pain in the past week Long  Term Goal 3: R LE strength improved one grade.  Problem List Patient Active Problem List  Diagnosis  . HYPERLIPIDEMIA  . DEPRESSION  . TINNITUS, RIGHT  . HYPERTENSION  . ASTHMA  . DEGENERATIVE JOINT DISEASE  . ARTHRITIS  . SCIATICA, LEFT  . BACK PAIN, LUMBAR, WITH RADICULOPATHY  . VERTIGO  . INSOMNIA  . FATIGUE  . PERIPHERAL EDEMA  . Hearing loss  . Carotid bruit  . Vitamin d deficiency  . Hypercalcemia  . CVA (cerebral infarction)  . Lack of coordination  . Weakness of right leg     GP    RUSSELL,CINDY 12/09/2012, 10:30 AM  Physician Documentation Your signature is required to indicate approval of the treatment plan as stated above.  Please sign and either send electronically or make a copy of this report for your files and return this physician signed original.   Please mark one 1.__approve of plan  2. ___approve of plan with the following conditions.   ______________________________                                                          _____________________ Physician Signature                                                                                                             Date

## 2012-12-16 ENCOUNTER — Ambulatory Visit (HOSPITAL_COMMUNITY): Payer: BC Managed Care – PPO | Admitting: *Deleted

## 2012-12-24 ENCOUNTER — Telehealth: Payer: Self-pay | Admitting: Family Medicine

## 2012-12-24 NOTE — Telephone Encounter (Signed)
Please ask her what her concerns are, she needs a statin drug, all of the statin drugs have these side effects

## 2012-12-24 NOTE — Telephone Encounter (Signed)
Can this be changed or does this need an OV?

## 2012-12-25 ENCOUNTER — Ambulatory Visit (HOSPITAL_COMMUNITY)
Admission: RE | Admit: 2012-12-25 | Discharge: 2012-12-25 | Disposition: A | Discharge status: Home / Self Care | Payer: BC Managed Care – PPO | Source: Ambulatory Visit | Attending: Physical Therapy | Admitting: Physical Therapy

## 2012-12-25 NOTE — Progress Notes (Addendum)
Physical Therapy Treatment Patient Details  Name: Teresa Soto MRN: [id number] Date of Birth: 30-Jun-1956  Today's Date: 12/25/2012 Time: 9629-5284 PT Time Calculation (min): 60 min Visit#: 2 of 4  Re-eval: 01/06/13 Authorization: BCBS Charges:  therex 38', self care 15'    Subjective: Symptoms/Limitations Symptoms: Pt states she has episodes of pain into LLE and under L pocket 3-4X week, Less numbness into her thighs.  RLE weakness from stroke.  Pt reports she has been unable to make her appts. due to weather and school conflicts. Pain Assessment Currently in Pain?: Yes Pain Score:   2 Pain Location: Hip Pain Orientation: Left   Exercise/Treatments Stretches Active Hamstring Stretch: 3 reps;30 seconds Piriformis Stretch: Limitations Piriformis Stretch Limitations: quadruped Aerobic Tread Mill: 8' at 1.4 mph with 1 UE assist Supine Ab Set: 10 reps Clam: 10 reps Bent Knee Raise: 10 reps Bridge: 10 reps Straight Leg Raise: 10 reps    Physical Therapy Assessment and Plan PT Assessment and Plan Clinical Impression Statement: Pt has not been to therapy X 2 weeks; Reviewed HEP with pt. having difficulty recalling exercises but performed correctly with demonstration. No new exercises added today other than treadmill.  Pt able to ambulate with good stride and cadence, however used 1 UE to steady gait.  Pt concerned with RLE weakness and had questions concerning her HEP and nerve pain.  Pt reported she feels her balance is most affected. PT Frequency: Min 1X/week PT Duration: 4 weeks PT Treatment/Interventions: Therapeutic exercise PT Plan: Add postural T-band, SL abduction and begin prone SAR, SLR oppostite arm/leg; fourth treament show all four and plank;  Pt will need new HEP with each treatment.     Problem List Patient Active Problem List  Diagnosis  . HYPERLIPIDEMIA  . DEPRESSION  . TINNITUS, RIGHT  . HYPERTENSION  . ASTHMA  . DEGENERATIVE JOINT DISEASE  .  ARTHRITIS  . SCIATICA, LEFT  . BACK PAIN, LUMBAR, WITH RADICULOPATHY  . VERTIGO  . INSOMNIA  . FATIGUE  . PERIPHERAL EDEMA  . Hearing loss  . Carotid bruit  . Vitamin d deficiency  . Hypercalcemia  . CVA (cerebral infarction)  . Lack of coordination  . Weakness of right leg      Lurena Nida, PTA/CLT 12/25/2012, 6:13 PM

## 2012-12-25 NOTE — Telephone Encounter (Signed)
Called patient and left message for them to return call at the office   

## 2012-12-31 ENCOUNTER — Ambulatory Visit (HOSPITAL_COMMUNITY): Payer: BC Managed Care – PPO | Admitting: Physical Therapy

## 2012-12-31 ENCOUNTER — Ambulatory Visit (HOSPITAL_COMMUNITY)
Admission: RE | Admit: 2012-12-31 | Discharge: 2012-12-31 | Disposition: A | Discharge status: Home / Self Care | Payer: BC Managed Care – PPO | Source: Ambulatory Visit | Attending: Family Medicine | Admitting: Family Medicine

## 2012-12-31 NOTE — Progress Notes (Signed)
Physical Therapy Treatment Patient Details  Name: [name] MRN: [id number] Date of Birth: 03/09/1956  Today's Date: 12/31/2012 Time: 4098-1191 (Tx completed by Nicholes Rough, PTT 8:47-915 PT Time Calculation (min): 51 min  Visit#: 3 of 4  Re-eval: 01/06/13 Charges: Gait x 10' Therex x 30'  Authorization: BCBS    Subjective: Symptoms/Limitations Symptoms: Pt states that her biggest issue is not having energy. Pain Assessment Currently in Pain?: No/denies   Exercise/Treatments  Stretches Active Hamstring Stretch: 3 reps;30 seconds Piriformis Stretch: Limitations Piriformis Stretch Limitations: quadruped Aerobic Tread Mill: 10' at 1.4 mph with 1 UE assist  Ab Set: 10 reps Clam: 10 reps Bent Knee Raise: 10 reps Bridge: 10 reps Straight Leg Raise: 10 reps Sidelying Hip Abduction: 10 reps Prone  Straight Leg Raise: 10 reps   Physical Therapy Assessment and Plan PT Assessment and Plan Clinical Impression Statement: Pt completes therex well with minimal need for cueing. Pt requires vc's to improve posture with gait training. Began new exercises with good for after initial cueing.  PT Plan: Reassess next session. Add postural T-band and opposite arm/leg  Pt will need new HEP.      Problem List Patient Active Problem List  Diagnosis  . HYPERLIPIDEMIA  . DEPRESSION  . TINNITUS, RIGHT  . HYPERTENSION  . ASTHMA  . DEGENERATIVE JOINT DISEASE  . ARTHRITIS  . SCIATICA, LEFT  . BACK PAIN, LUMBAR, WITH RADICULOPATHY  . VERTIGO  . INSOMNIA  . FATIGUE  . PERIPHERAL EDEMA  . Hearing loss  . Carotid bruit  . Vitamin d deficiency  . Hypercalcemia  . CVA (cerebral infarction)  . Lack of coordination  . Weakness of right leg    PT - End of Session Activity Tolerance: Patient tolerated treatment well General Behavior During Session: [facility] for tasks performed Cognition: [facility] for tasks performed   Seth Bake, PTA 12/31/2012, 9:43 AM

## 2013-01-01 ENCOUNTER — Ambulatory Visit (HOSPITAL_COMMUNITY): Payer: BC Managed Care – PPO | Admitting: Physical Therapy

## 2013-01-02 ENCOUNTER — Emergency Department (HOSPITAL_COMMUNITY)
Admission: EM | Admit: 2013-01-02 | Discharge: 2013-01-02 | Disposition: A | Discharge status: Home / Self Care | Payer: BC Managed Care – PPO | Attending: Emergency Medicine | Admitting: Emergency Medicine

## 2013-01-02 ENCOUNTER — Encounter (HOSPITAL_COMMUNITY): Payer: Self-pay | Admitting: Emergency Medicine

## 2013-01-02 DIAGNOSIS — Z791 Long term (current) use of non-steroidal anti-inflammatories (NSAID): Secondary | ICD-10-CM | POA: Insufficient documentation

## 2013-01-02 DIAGNOSIS — Z23 Encounter for immunization: Secondary | ICD-10-CM | POA: Insufficient documentation

## 2013-01-02 DIAGNOSIS — F329 Major depressive disorder, single episode, unspecified: Secondary | ICD-10-CM | POA: Insufficient documentation

## 2013-01-02 DIAGNOSIS — Z79899 Other long term (current) drug therapy: Secondary | ICD-10-CM | POA: Insufficient documentation

## 2013-01-02 DIAGNOSIS — Z8669 Personal history of other diseases of the nervous system and sense organs: Secondary | ICD-10-CM | POA: Insufficient documentation

## 2013-01-02 DIAGNOSIS — Y9289 Other specified places as the place of occurrence of the external cause: Secondary | ICD-10-CM | POA: Insufficient documentation

## 2013-01-02 DIAGNOSIS — Z8673 Personal history of transient ischemic attack (TIA), and cerebral infarction without residual deficits: Secondary | ICD-10-CM | POA: Insufficient documentation

## 2013-01-02 DIAGNOSIS — S61209A Unspecified open wound of unspecified finger without damage to nail, initial encounter: Secondary | ICD-10-CM | POA: Insufficient documentation

## 2013-01-02 DIAGNOSIS — F3289 Other specified depressive episodes: Secondary | ICD-10-CM | POA: Insufficient documentation

## 2013-01-02 DIAGNOSIS — I10 Essential (primary) hypertension: Secondary | ICD-10-CM | POA: Insufficient documentation

## 2013-01-02 DIAGNOSIS — J45901 Unspecified asthma with (acute) exacerbation: Secondary | ICD-10-CM | POA: Insufficient documentation

## 2013-01-02 DIAGNOSIS — W268XXA Contact with other sharp object(s), not elsewhere classified, initial encounter: Secondary | ICD-10-CM | POA: Insufficient documentation

## 2013-01-02 DIAGNOSIS — S61219A Laceration without foreign body of unspecified finger without damage to nail, initial encounter: Secondary | ICD-10-CM

## 2013-01-02 DIAGNOSIS — Y93E9 Activity, other interior property and clothing maintenance: Secondary | ICD-10-CM | POA: Insufficient documentation

## 2013-01-02 DIAGNOSIS — IMO0002 Reserved for concepts with insufficient information to code with codable children: Secondary | ICD-10-CM | POA: Insufficient documentation

## 2013-01-02 DIAGNOSIS — G2581 Restless legs syndrome: Secondary | ICD-10-CM | POA: Insufficient documentation

## 2013-01-02 DIAGNOSIS — Z87891 Personal history of nicotine dependence: Secondary | ICD-10-CM | POA: Insufficient documentation

## 2013-01-02 MED ORDER — TETANUS-DIPHTH-ACELL PERTUSSIS 5-2.5-18.5 LF-MCG/0.5 IM SUSP
0.5000 mL | Freq: Once | INTRAMUSCULAR | Status: AC
  Administered 2013-01-02: 0.5 mL via INTRAMUSCULAR
  Filled 2013-01-02: qty 0.5

## 2013-01-02 NOTE — ED Provider Notes (Signed)
Medical screening examination/treatment/procedure(s) were performed by non-physician practitioner and as supervising physician I was immediately available for consultation/collaboration.  Payten Hobin L Christiano Blandon, MD 01/02/13 1615 

## 2013-01-02 NOTE — ED Notes (Signed)
Pt c/o laceration to left middle finger from coffee mug.

## 2013-01-02 NOTE — ED Provider Notes (Signed)
History     CSN: [id number]  Arrival date & time 01/02/13  1009   First MD Initiated Contact with Patient 01/02/13 1121      Chief Complaint  Patient presents with  . Laceration    (Consider location/radiation/quality/duration/timing/severity/associated sxs/prior treatment) Patient is a 57 y.o. female presenting with skin laceration. The history is provided by the patient.  Laceration Location:  Hand Hand laceration location:  L finger Depth:  Cutaneous Quality: jagged   Bleeding: controlled   Time since incident:  2 hours Laceration mechanism:  Broken glass Pain details:    Quality:  Aching   Severity:  Mild   Progression:  Partially resolved Foreign body present:  No foreign bodies Relieved by:  Pressure Ineffective treatments:  None tried Tetanus status:  Out of date   Past Medical History  Diagnosis Date  . Vertigo   . Hypertension   . Asthma   . Neuropathic pain   . Restless legs syndrome 2007 approx  . Depression   . Stroke     Past Surgical History  Procedure Laterality Date  . Back surgery    . Tonsillectomy    . Cesarean section      x 2  . Shoulder arthroscopy w/ rotator cuff repair      to right shoulder  . Tubal ligation    . Spine surgery  12/2010    ruptd L1 L2 , Dr Channing Mutters    No family history on file.  History  Substance Use Topics  . Smoking status: Former Games developer  . Smokeless tobacco: Not on file  . Alcohol Use: No    OB History   Grav Para Term Preterm Abortions TAB SAB Ect Mult Living                  Review of Systems  Constitutional: Negative for activity change.       All ROS Neg except as noted in HPI  HENT: Negative for nosebleeds and neck pain.   Eyes: Negative for photophobia and discharge.  Respiratory: Positive for wheezing. Negative for cough and shortness of breath.   Cardiovascular: Negative for chest pain and palpitations.  Gastrointestinal: Negative for abdominal pain and blood in stool.  Genitourinary:  Negative for dysuria, frequency and hematuria.  Musculoskeletal: Positive for back pain and arthralgias.  Skin: Negative.   Neurological: Negative for dizziness, seizures and speech difficulty.  Psychiatric/Behavioral: Negative for hallucinations and confusion.       Depression    Allergies  Other  Home Medications   Current Outpatient Rx  Name  Route  Sig  Dispense  Refill  . albuterol (PROVENTIL HFA;VENTOLIN HFA) 108 (90 BASE) MCG/ACT inhaler   Inhalation   Inhale 2 puffs into the lungs every 6 (six) hours as needed. Shortness of Breath         . aspirin EC 81 MG tablet   Oral   Take 1 tablet (81 mg total) by mouth daily.   150 tablet   2   . budesonide-formoterol (SYMBICORT) 160-4.5 MCG/ACT inhaler   Inhalation   Inhale 2 puffs into the lungs 2 (two) times daily as needed (seasonal allergies).          . cetirizine (ZYRTEC) 10 MG tablet   Oral   Take 10 mg by mouth daily.           Marland Kitchen escitalopram (LEXAPRO) 20 MG tablet   Oral   Take 1 tablet (20 mg total) by mouth daily.  30 tablet   6   . gabapentin (NEURONTIN) 300 MG capsule   Oral   Take 600 mg by mouth daily.          Marland Kitchen lisinopril (PRINIVIL,ZESTRIL) 10 MG tablet   Oral   Take 1 tablet (10 mg total) by mouth daily.   30 tablet   3   . Melatonin 1 MG TABS   Oral   Take 300 tablets by mouth at bedtime.          . naproxen sodium (ALEVE) 220 MG tablet   Oral   Take 440 mg by mouth daily.          . pramipexole (MIRAPEX) 0.25 MG tablet   Oral   Take 1 tablet (0.25 mg total) by mouth at bedtime.   30 tablet   3     BP 132/85  Pulse 89  Temp(Src) 98.9 F (37.2 C)  Resp 18  Ht 5\' 1"  (1.549 m)  Wt 160 lb (72.576 kg)  BMI 30.25 kg/m2  SpO2 98%  Physical Exam  Nursing note and vitals reviewed. Constitutional: She is oriented to person, place, and time. She appears well-developed and well-nourished.  Non-toxic appearance.  HENT:  Head: Normocephalic.  Right Ear: Tympanic membrane  and external ear normal.  Left Ear: Tympanic membrane and external ear normal.  Eyes: EOM and lids are normal. Pupils are equal, round, and reactive to light.  Neck: Normal range of motion. Neck supple. Carotid bruit is not present.  Cardiovascular: Normal rate, regular rhythm, normal heart sounds, intact distal pulses and normal pulses.   Pulmonary/Chest: Breath sounds normal. No respiratory distress.  Abdominal: Soft. Bowel sounds are normal. There is no tenderness. There is no guarding.  Musculoskeletal: Normal range of motion.  Laceration to the palmar surface of the left middle finger. The wound measures 0.6cm. Good ROM of the middle finger and all other fingers.  Lymphadenopathy:       Head (right side): No submandibular adenopathy present.       Head (left side): No submandibular adenopathy present.    She has no cervical adenopathy.  Neurological: She is alert and oriented to person, place, and time. She has normal strength. No cranial nerve deficit or sensory deficit.  Skin: Skin is warm and dry.  Psychiatric: She has a normal mood and affect. Her speech is normal.    ED Course  Procedures  - LACERATION REPAIR OF THE LEFT MIDDLE FINGER.  Patient identified by arm band. Permission for the procedure given by the patient. Procedural time out taken before repair of laceration to the left middle finger.  The procedure was explained to the patient in terms which he understood. Questions were answered.  The wound was irrigated with tap water. The wound is cleansed with safe cleanse. Wound edge anastomosis was achieved with 2 Steri-Strips. The wound was then sealed with Dermabond. Patient tolerated the procedure without problem. There was no bleeding under the wound after the procedure. Capillary refill remains at less than 3 seconds. Wound measures 0.6cm.  Labs Reviewed - No data to display No results found.   No diagnosis found.    MDM  I have reviewed nursing notes, vital signs,  and all appropriate lab and imaging results for this patient. Patient sustained laceration of the left middle finger. The wound was repaired with Steri-Strips and Dermabond without problem.  Patient is informed to return to the emergency department if any changes, problems, or concerns.  Kathie Dike, PA-C 01/02/13 1243

## 2013-01-02 NOTE — ED Notes (Signed)
Pt states she was washing her dishes this morning and while washing her coffee mug the mug broke on her hand. Pt states that she initially bled through one small rag.

## 2013-01-07 ENCOUNTER — Ambulatory Visit (HOSPITAL_COMMUNITY)
Admission: RE | Admit: 2013-01-07 | Discharge: 2013-01-07 | Disposition: A | Discharge status: Home / Self Care | Payer: BC Managed Care – PPO | Source: Ambulatory Visit | Attending: Family Medicine | Admitting: Family Medicine

## 2013-01-07 DIAGNOSIS — I10 Essential (primary) hypertension: Secondary | ICD-10-CM | POA: Insufficient documentation

## 2013-01-07 DIAGNOSIS — IMO0001 Reserved for inherently not codable concepts without codable children: Secondary | ICD-10-CM | POA: Insufficient documentation

## 2013-01-07 DIAGNOSIS — R279 Unspecified lack of coordination: Secondary | ICD-10-CM | POA: Insufficient documentation

## 2013-01-07 NOTE — Progress Notes (Signed)
Physical Therapy Treatment Patient Details  Name: Teresa Soto MRN: [id number] Date of Birth: 19-Jul-1956  Today's Date: 01/07/2013 Time: 1102-1135 PT Time Calculation (min): 33 min  Visit#: 3 of 4  Re-eval: 01/06/13 Charges: MMT x 1 Self care x 20'  Authorization: [employer]    Subjective: Symptoms/Limitations Symptoms: Pt states that she feels stronger and doesn't get tired as easily as she used. Pain Assessment Currently in Pain?: No/denies   Exercise/Treatments Physical Therapy Assessment and Plan PT Assessment and Plan Clinical Impression Statement: Pt able to verbalize importance of posture and core strength. Pt states that she plans to exercises at the [facility]. Strength has improved and all goals have been met. Pt is comfortable with D/C to HEP. PT Plan: Recommend D/C to HEP.    Goals Home Exercise Program Pt will Perform Home Exercise Program: Independently PT Long Term Goals Time to Complete Long Term Goals: 4 weeks PT Long Term Goal 1: I in advance HEP PT Long Term Goal 1 - Progress: Met PT Long Term Goal 2: No hip pain in the past week PT Long Term Goal 2 - Progress: Met Long Term Goal 3: R LE strength improved one grade. Long Term Goal 3 Progress: Partly met (5/5 throughout except hip extension which was 4/5)  Problem List Patient Active Problem List  Diagnosis  . HYPERLIPIDEMIA  . DEPRESSION  . TINNITUS, RIGHT  . HYPERTENSION  . ASTHMA  . DEGENERATIVE JOINT DISEASE  . ARTHRITIS  . SCIATICA, LEFT  . BACK PAIN, LUMBAR, WITH RADICULOPATHY  . VERTIGO  . INSOMNIA  . FATIGUE  . PERIPHERAL EDEMA  . Hearing loss  . Carotid bruit  . Vitamin d deficiency  . Hypercalcemia  . CVA (cerebral infarction)  . Lack of coordination  . Weakness of right leg    PT - End of Session Activity Tolerance: Patient tolerated treatment well General Behavior During Session: [facility] for tasks performed Cognition: [facility] for tasks performed PT Plan of Care PT Home Exercise Plan:  Given for postural tband and prone hip extension.  Seth Bake, PTA 01/07/2013, 11:52 AM

## 2013-01-08 ENCOUNTER — Ambulatory Visit: Payer: Self-pay | Admitting: Neurology

## 2013-01-09 ENCOUNTER — Ambulatory Visit: Payer: BC Managed Care – PPO | Admitting: Family Medicine

## 2013-01-10 ENCOUNTER — Ambulatory Visit: Payer: BC Managed Care – PPO | Admitting: Family Medicine

## 2013-01-10 ENCOUNTER — Encounter: Payer: Self-pay | Admitting: Family Medicine

## 2013-01-13 NOTE — Telephone Encounter (Signed)
Pt recently missed last appt

## 2013-02-28 ENCOUNTER — Other Ambulatory Visit: Payer: Self-pay | Admitting: Family Medicine

## 2013-03-01 ENCOUNTER — Other Ambulatory Visit: Payer: Self-pay | Admitting: Family Medicine

## 2013-04-02 ENCOUNTER — Telehealth: Payer: Self-pay | Admitting: Family Medicine

## 2013-04-02 MED ORDER — PRAMIPEXOLE DIHYDROCHLORIDE 0.25 MG PO TABS: ORAL_TABLET | ORAL | Status: DC

## 2013-04-02 NOTE — Telephone Encounter (Signed)
Refilled this one time and pt aware to make appt.

## 2013-04-04 ENCOUNTER — Ambulatory Visit (INDEPENDENT_AMBULATORY_CARE_PROVIDER_SITE_OTHER): Payer: BC Managed Care – PPO | Admitting: Family Medicine

## 2013-04-04 VITALS — BP 130/70 | HR 80 | Temp 99.4°F | Resp 20 | Ht 61.0 in | Wt 160.0 lb

## 2013-04-04 DIAGNOSIS — IMO0002 Reserved for concepts with insufficient information to code with codable children: Secondary | ICD-10-CM

## 2013-04-04 DIAGNOSIS — E669 Obesity, unspecified: Secondary | ICD-10-CM

## 2013-04-04 DIAGNOSIS — E785 Hyperlipidemia, unspecified: Secondary | ICD-10-CM

## 2013-04-04 DIAGNOSIS — I10 Essential (primary) hypertension: Secondary | ICD-10-CM

## 2013-04-04 DIAGNOSIS — J45909 Unspecified asthma, uncomplicated: Secondary | ICD-10-CM

## 2013-04-04 MED ORDER — MONTELUKAST SODIUM 10 MG PO TABS: 10.0000 mg | ORAL_TABLET | Freq: Every day | ORAL | Status: DC

## 2013-04-04 NOTE — Patient Instructions (Addendum)
Try the singulair Get the labs done fasting within the next week Take the gabapentin 600mg  at bedtime for the numbness in leg  We will call with lab results F/U 4 months

## 2013-04-06 ENCOUNTER — Encounter: Payer: Self-pay | Admitting: Family Medicine

## 2013-04-06 DIAGNOSIS — E669 Obesity, unspecified: Secondary | ICD-10-CM | POA: Insufficient documentation

## 2013-04-06 DIAGNOSIS — Z6833 Body mass index (BMI) 33.0-33.9, adult: Secondary | ICD-10-CM | POA: Insufficient documentation

## 2013-04-06 NOTE — Assessment & Plan Note (Signed)
deteriorated this week, will add singulair to cover both asthma and allergies

## 2013-04-06 NOTE — Progress Notes (Signed)
  Subjective:    Patient ID: Teresa Soto, female    DOB: 11/14/1955, 57 y.o.   MRN: [id number]  HPI   Pt here to f/u chronic medical problems. Has had increased cough, some sneezing past week. Does not think symbicort is doing a great job, but does not want to change inhalers due to cost. Wants to take a pill to help her asthma.  Chronic back pain- followed by neurosurgery  Had repeat MRI, told scar tissue ,no intervention needed, gabapentin helps but pain but she does not take on regular basis H/O CVA- completed PT, did not follow up with neurology, thought is was a waste of time, had not had repeat fasting labs. Stopped her zocor because it caused upset stomach.  Mediations reviewed  Review of Systems  GEN- denies fatigue, fever, weight loss,weakness, recent illness HEENT- denies eye drainage, change in vision, nasal discharge, CVS- denies chest pain, palpitations RESP- denies SOB, +cough, wheeze ABD- denies N/V, change in stools, abd pain GU- denies dysuria, hematuria, dribbling, incontinence MSK- + joint pain, muscle aches, injury Neuro- denies headache, dizziness, syncope, seizure activity      Objective:   Physical Exam GEN- NAD, alert and oriented x3 HEENT- PERRL, EOMI, non injected sclera, pink conjunctiva, MMM, oropharynx clear Neck- Supple,  CVS- RRR, no murmur RESP-CTAB, no wheeze, no rhonchi EXT- No edema Pulses- Radial, DP- 2+        Assessment & Plan:

## 2013-04-06 NOTE — Assessment & Plan Note (Signed)
Well controlled 

## 2013-04-06 NOTE — Assessment & Plan Note (Signed)
Check FLP, she stopped zocor without my knowledge, but willing to try another statin drug

## 2013-04-06 NOTE — Assessment & Plan Note (Signed)
Discussed using gabapentin on regular basis as she does have improvement in her pain and radicular symptoms, no further intervention at this time by neurosurgery

## 2013-04-15 ENCOUNTER — Telehealth: Payer: Self-pay | Admitting: Family Medicine

## 2013-04-15 MED ORDER — LISINOPRIL 10 MG PO TABS: 10.0000 mg | ORAL_TABLET | Freq: Every day | ORAL | Status: DC

## 2013-04-15 NOTE — Telephone Encounter (Signed)
Med refilled.

## 2013-04-23 ENCOUNTER — Encounter: Payer: Self-pay | Admitting: Family Medicine

## 2013-04-25 ENCOUNTER — Encounter: Payer: Self-pay | Admitting: Family Medicine

## 2013-05-12 ENCOUNTER — Telehealth: Payer: Self-pay | Admitting: Family Medicine

## 2013-05-12 MED ORDER — PRAMIPEXOLE DIHYDROCHLORIDE 0.25 MG PO TABS: ORAL_TABLET | ORAL | Status: DC

## 2013-05-12 NOTE — Telephone Encounter (Signed)
Rx Refilled  

## 2013-05-15 ENCOUNTER — Telehealth: Payer: Self-pay | Admitting: Family Medicine

## 2013-05-15 MED ORDER — PRAMIPEXOLE DIHYDROCHLORIDE 0.25 MG PO TABS: ORAL_TABLET | ORAL | Status: DC

## 2013-05-15 NOTE — Telephone Encounter (Signed)
Med refilled.

## 2013-06-18 ENCOUNTER — Telehealth: Payer: Self-pay | Admitting: Family Medicine

## 2013-06-18 MED ORDER — ESCITALOPRAM OXALATE 20 MG PO TABS: 20.0000 mg | ORAL_TABLET | Freq: Every day | ORAL | Status: DC

## 2013-06-18 NOTE — Telephone Encounter (Signed)
Lexapro 20 mg tab 1 QD #30

## 2013-06-18 NOTE — Telephone Encounter (Signed)
Ok to refill 

## 2013-08-04 ENCOUNTER — Ambulatory Visit: Payer: BC Managed Care – PPO | Admitting: Family Medicine

## 2013-08-09 ENCOUNTER — Other Ambulatory Visit: Payer: Self-pay | Admitting: Family Medicine

## 2013-08-11 ENCOUNTER — Encounter: Payer: Self-pay | Admitting: Family Medicine

## 2013-08-11 NOTE — Telephone Encounter (Signed)
Medication refill for one time only.  Patient needs to be seen.  Letter sent for patient to call and schedule 

## 2013-08-14 ENCOUNTER — Other Ambulatory Visit: Payer: Self-pay

## 2013-09-16 ENCOUNTER — Other Ambulatory Visit: Payer: Self-pay | Admitting: Family Medicine

## 2013-09-16 ENCOUNTER — Encounter: Payer: Self-pay | Admitting: Family Medicine

## 2013-09-16 NOTE — Telephone Encounter (Signed)
Medication refill for one time only.  Patient needs to be seen.  Letter sent for patient to call and schedule 

## 2013-09-22 ENCOUNTER — Other Ambulatory Visit: Payer: Self-pay | Admitting: *Deleted

## 2013-09-22 MED ORDER — MONTELUKAST SODIUM 10 MG PO TABS: ORAL_TABLET | ORAL | Status: DC

## 2013-09-22 MED ORDER — LISINOPRIL 10 MG PO TABS: ORAL_TABLET | ORAL | Status: DC

## 2013-09-22 NOTE — Telephone Encounter (Signed)
Med refilled, no further refills until pt makes appt.

## 2013-10-09 DIAGNOSIS — I639 Cerebral infarction, unspecified: Secondary | ICD-10-CM

## 2013-10-09 HISTORY — DX: Cerebral infarction, unspecified: I63.9

## 2013-10-10 IMAGING — CR DG CHEST 2V
2 series · 2 of 2 positions shown · non-contrast
Comparison: 09/27/2012

CLINICAL DATA: Dizziness.  Stroke last week on the right side.  Not
feeling well.  Numbness and right eye.

CHEST - 2 VIEW

[view not recorded (1 of 2)]
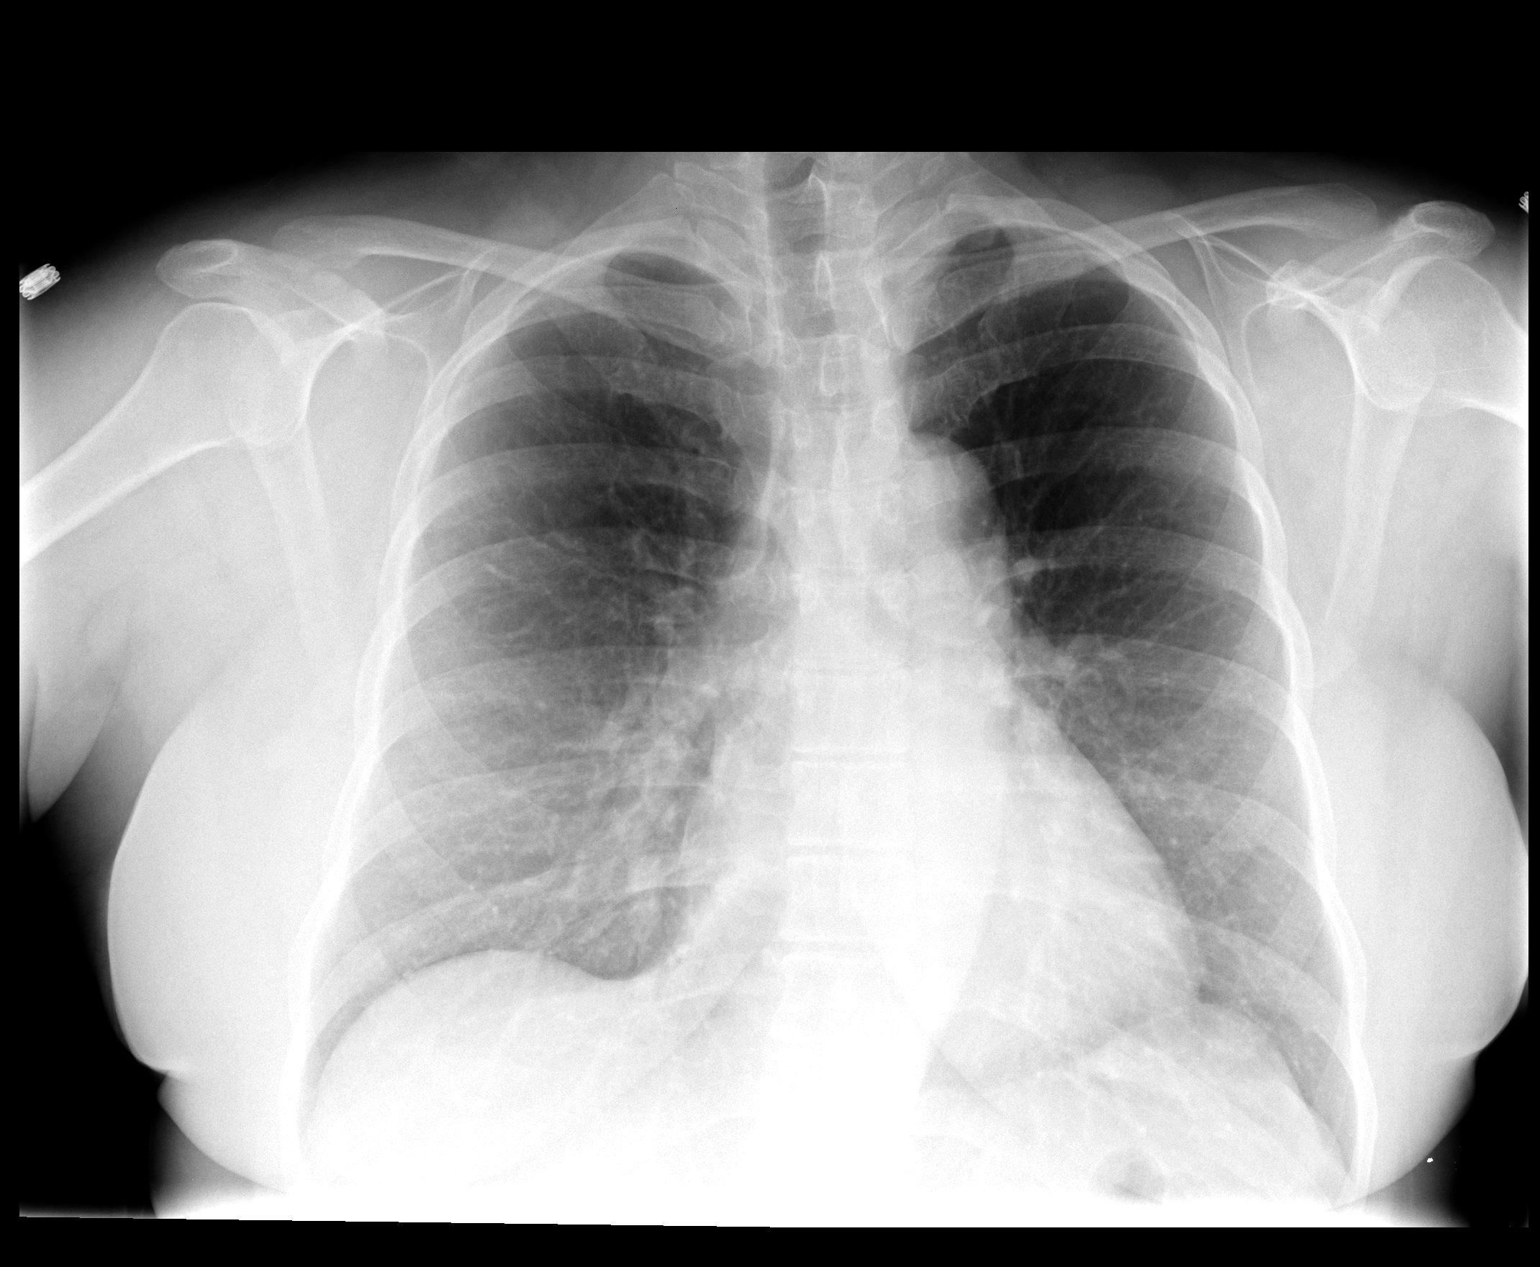

[view not recorded (2 of 2)]
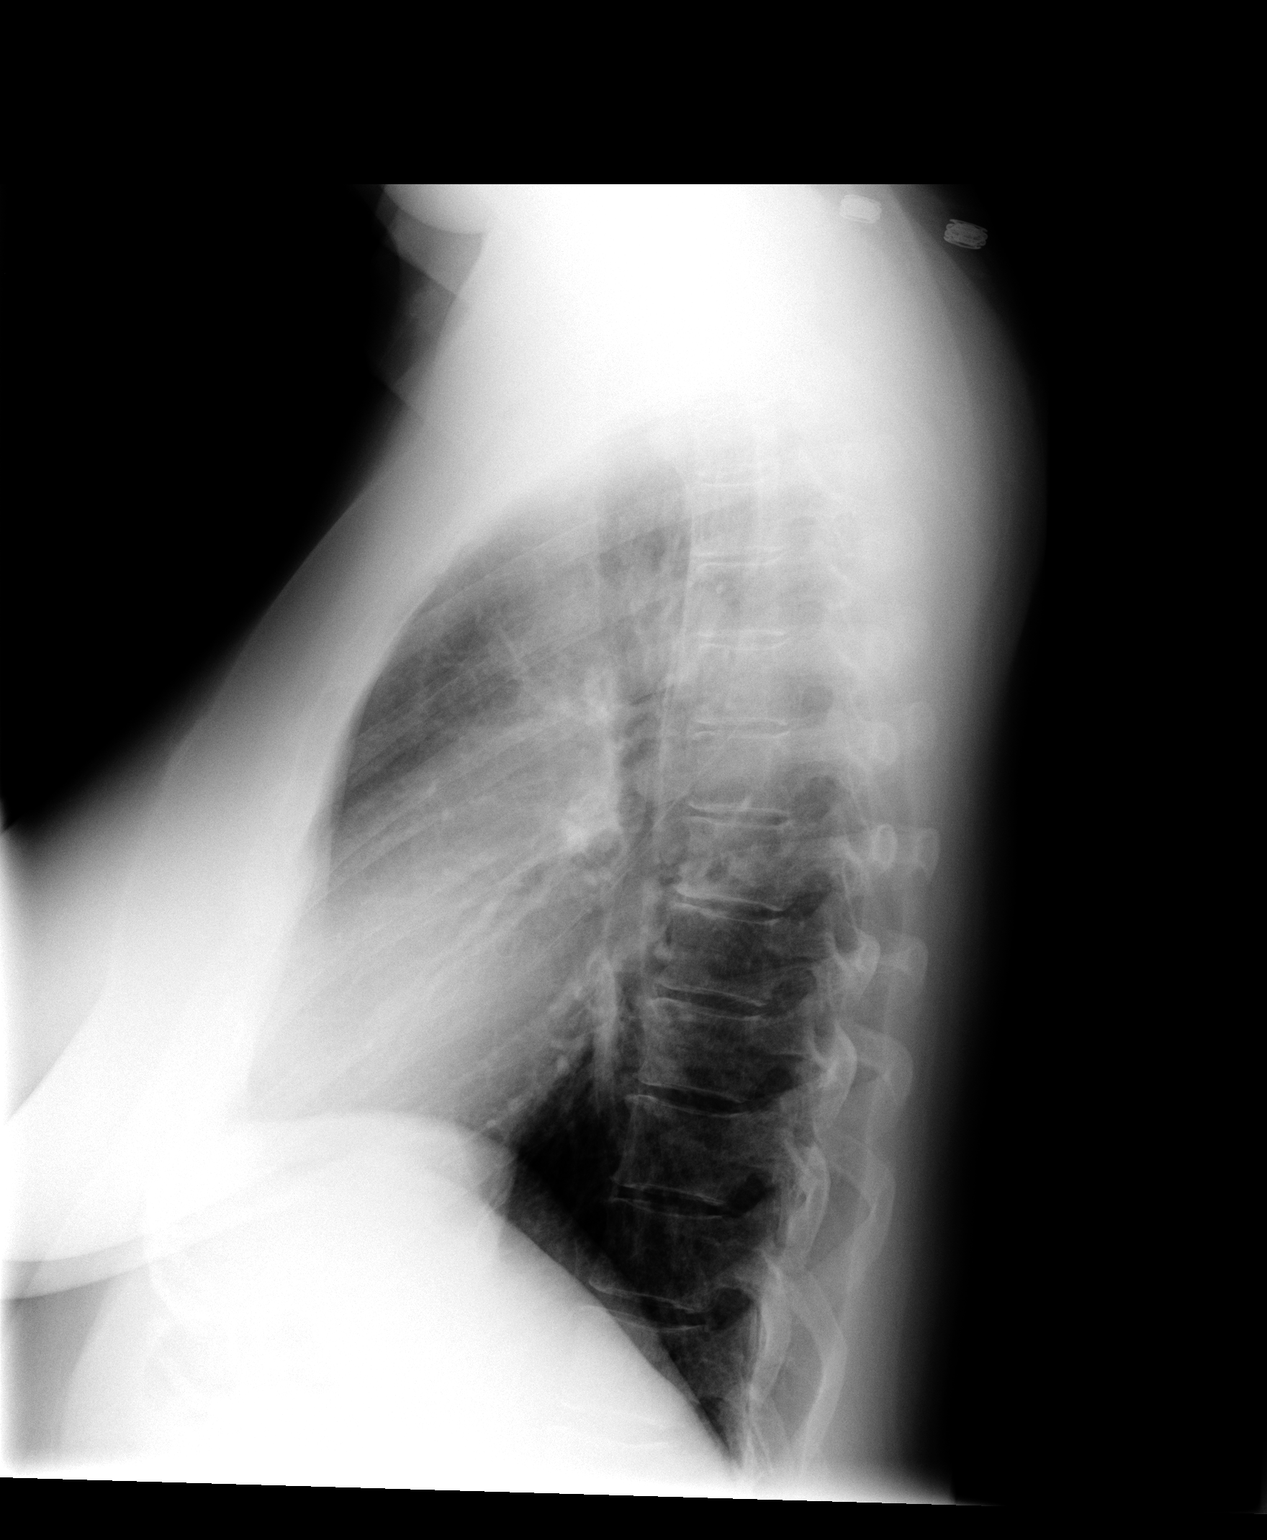

[2 of 2 positions shown; findings below may reference images not displayed]

FINDINGS: Heart size is normal.  The lungs are free of
consolidations and pleural effusions.  No pulmonary edema.
Visualized osseous structures have a normal appearance.
IMPRESSION: No evidence for acute cardiopulmonary abnormality.

## 2013-10-20 ENCOUNTER — Encounter: Payer: Self-pay | Admitting: Family Medicine

## 2013-10-20 ENCOUNTER — Other Ambulatory Visit: Payer: Self-pay | Admitting: Family Medicine

## 2013-10-20 ENCOUNTER — Ambulatory Visit (INDEPENDENT_AMBULATORY_CARE_PROVIDER_SITE_OTHER): Payer: BC Managed Care – PPO | Admitting: Family Medicine

## 2013-10-20 VITALS — BP 136/88 | HR 98 | Temp 98.8°F | Resp 18 | Ht 61.0 in | Wt 159.5 lb

## 2013-10-20 DIAGNOSIS — E785 Hyperlipidemia, unspecified: Secondary | ICD-10-CM

## 2013-10-20 DIAGNOSIS — H9191 Unspecified hearing loss, right ear: Secondary | ICD-10-CM

## 2013-10-20 DIAGNOSIS — J45909 Unspecified asthma, uncomplicated: Secondary | ICD-10-CM

## 2013-10-20 DIAGNOSIS — I10 Essential (primary) hypertension: Secondary | ICD-10-CM

## 2013-10-20 DIAGNOSIS — H9190 Unspecified hearing loss, unspecified ear: Secondary | ICD-10-CM

## 2013-10-20 DIAGNOSIS — F329 Major depressive disorder, single episode, unspecified: Secondary | ICD-10-CM

## 2013-10-20 DIAGNOSIS — F3289 Other specified depressive episodes: Secondary | ICD-10-CM

## 2013-10-20 DIAGNOSIS — M129 Arthropathy, unspecified: Secondary | ICD-10-CM

## 2013-10-20 LAB — COMPREHENSIVE METABOLIC PANEL
ALK PHOS: 88 U/L (ref 39–117)
ALT: 16 U/L (ref 0–35)
AST: 17 U/L (ref 0–37)
Albumin: 4.3 g/dL (ref 3.5–5.2)
BILIRUBIN TOTAL: 0.4 mg/dL (ref 0.3–1.2)
BUN: 17 mg/dL (ref 6–23)
CO2: 28 mEq/L (ref 19–32)
CREATININE: 0.83 mg/dL (ref 0.50–1.10)
Calcium: 11.7 mg/dL — ABNORMAL HIGH (ref 8.4–10.5)
Chloride: 108 mEq/L (ref 96–112)
Glucose, Bld: 91 mg/dL (ref 70–99)
Potassium: 4.8 mEq/L (ref 3.5–5.3)
SODIUM: 143 meq/L (ref 135–145)
TOTAL PROTEIN: 6.9 g/dL (ref 6.0–8.3)

## 2013-10-20 LAB — CBC
HCT: 39.3 % (ref 36.0–46.0)
Hemoglobin: 13.5 g/dL (ref 12.0–15.0)
MCH: 28.9 pg (ref 26.0–34.0)
MCHC: 34.4 g/dL (ref 30.0–36.0)
MCV: 84.2 fL (ref 78.0–100.0)
PLATELETS: 313 10*3/uL (ref 150–400)
RBC: 4.67 MIL/uL (ref 3.87–5.11)
RDW: 14.5 % (ref 11.5–15.5)
WBC: 5.8 10*3/uL (ref 4.0–10.5)

## 2013-10-20 LAB — LIPID PANEL
CHOL/HDL RATIO: 5.2 ratio
CHOLESTEROL: 266 mg/dL — AB (ref 0–200)
HDL: 51 mg/dL (ref >39)
LDL CALC: 193 mg/dL — AB (ref 0–99)
Triglycerides: 111 mg/dL (ref <150)
VLDL: 22 mg/dL (ref 0–40)

## 2013-10-20 MED ORDER — ALBUTEROL SULFATE HFA 108 (90 BASE) MCG/ACT IN AERS: 2.0000 | INHALATION_SPRAY | Freq: Four times a day (QID) | RESPIRATORY_TRACT | Status: DC | PRN

## 2013-10-20 MED ORDER — LISINOPRIL 10 MG PO TABS: ORAL_TABLET | ORAL | Status: DC

## 2013-10-20 MED ORDER — MONTELUKAST SODIUM 10 MG PO TABS: ORAL_TABLET | ORAL | Status: DC

## 2013-10-20 MED ORDER — PRAMIPEXOLE DIHYDROCHLORIDE 0.25 MG PO TABS: ORAL_TABLET | ORAL | Status: DC

## 2013-10-20 NOTE — Assessment & Plan Note (Signed)
She has more cough variant asthma which is worsened off of her medications. We will restart the Singulair as well as his Symbicort twice a day.

## 2013-10-20 NOTE — Assessment & Plan Note (Signed)
Refer to ear nose and throat for evaluation

## 2013-10-20 NOTE — Assessment & Plan Note (Signed)
Doing well on Lexapro we'll continue current dose

## 2013-10-20 NOTE — Assessment & Plan Note (Signed)
Continue Aleve BID Multple joints involved, mostly in hands Has been on celebrex in the past

## 2013-10-20 NOTE — Progress Notes (Signed)
   Subjective:    Patient ID: Teresa Soto, female    DOB: April 27, 1956, 58 y.o.   MRN: [id number]  HPI Patient here to followup chronic medical problems. She's concerned about her hearing. She noticed the coming noise and decreased hearing in the right ear for the past 3 years it is getting worse. She will like to have this evaluated this is causing difficulty on the job as well as hearing on the telephone.  She's a history of asthma she's been out of her medications for the past month. She's had proximal coughs on and off and occasional wheeze. She denies any shortness of breath. Her cough is nonproductive. She denies any fever or sinus drainage   Hypertension- she's been out of her medication for the past month.  History of CVA -she stopped her statin drug some months ago states that it causes some type of disagreement with her but she cannot recall what the problem was. Due for fasting labs today.     Review of Systems   GEN- denies fatigue, fever, weight loss,weakness, recent illness HEENT- denies eye drainage, change in vision, nasal discharge, CVS- denies chest pain, palpitations RESP- denies SOB, +cough, wheeze ABD- denies N/V, change in stools, abd pain GU- denies dysuria, hematuria, dribbling, incontinence MSK- denies joint pain, muscle aches, injury Neuro- denies headache, dizziness, syncope, seizure activity      Objective:   Physical Exam  GEN- NAD, alert and oriented x3 HEENT- PERRL, EOMI, non injected sclera, pink conjunctiva, MMM, oropharynx clear, TM clear bilat, no maxillary sinus tenderness Neck- Supple, no LAD CVS- RRR, no murmur RESP-CTAB EXT- No edema Pulses- Radial, DP- 2+ Neuro- CNII-XII in tact, decreased hearing with soft sounds/whisper       Assessment & Plan:

## 2013-10-20 NOTE — Assessment & Plan Note (Signed)
Recheck fasting lipid panel. Will likely need to try a different statin drug

## 2013-10-20 NOTE — Assessment & Plan Note (Signed)
Restart lisinopril at 10 mg

## 2013-10-20 NOTE — Assessment & Plan Note (Signed)
>>  ASSESSMENT AND PLAN FOR MAJOR DEPRESSION WRITTEN ON 10/20/2013 11:43 AM BY BARI REA F  Doing well on Lexapro  we'll continue current dose

## 2013-10-20 NOTE — Patient Instructions (Addendum)
Referral to ENT - for hearing  We will send a letter if labs are normal F/U Physical Exam - Schedule ASAP

## 2013-10-27 ENCOUNTER — Emergency Department (HOSPITAL_COMMUNITY): Payer: BC Managed Care – PPO

## 2013-10-27 ENCOUNTER — Emergency Department (HOSPITAL_COMMUNITY)
Admission: EM | Admit: 2013-10-27 | Discharge: 2013-10-27 | Disposition: A | Discharge status: Home / Self Care | Payer: BC Managed Care – PPO | Attending: Emergency Medicine | Admitting: Emergency Medicine

## 2013-10-27 ENCOUNTER — Encounter (HOSPITAL_COMMUNITY): Payer: Self-pay | Admitting: Emergency Medicine

## 2013-10-27 DIAGNOSIS — J45909 Unspecified asthma, uncomplicated: Secondary | ICD-10-CM | POA: Insufficient documentation

## 2013-10-27 DIAGNOSIS — Z7982 Long term (current) use of aspirin: Secondary | ICD-10-CM | POA: Insufficient documentation

## 2013-10-27 DIAGNOSIS — I10 Essential (primary) hypertension: Secondary | ICD-10-CM | POA: Insufficient documentation

## 2013-10-27 DIAGNOSIS — Z9089 Acquired absence of other organs: Secondary | ICD-10-CM | POA: Insufficient documentation

## 2013-10-27 DIAGNOSIS — Z79899 Other long term (current) drug therapy: Secondary | ICD-10-CM | POA: Insufficient documentation

## 2013-10-27 DIAGNOSIS — R110 Nausea: Secondary | ICD-10-CM | POA: Insufficient documentation

## 2013-10-27 DIAGNOSIS — B9789 Other viral agents as the cause of diseases classified elsewhere: Secondary | ICD-10-CM | POA: Insufficient documentation

## 2013-10-27 DIAGNOSIS — G8929 Other chronic pain: Secondary | ICD-10-CM | POA: Insufficient documentation

## 2013-10-27 DIAGNOSIS — F329 Major depressive disorder, single episode, unspecified: Secondary | ICD-10-CM | POA: Insufficient documentation

## 2013-10-27 DIAGNOSIS — Z87891 Personal history of nicotine dependence: Secondary | ICD-10-CM | POA: Insufficient documentation

## 2013-10-27 DIAGNOSIS — Z8669 Personal history of other diseases of the nervous system and sense organs: Secondary | ICD-10-CM | POA: Insufficient documentation

## 2013-10-27 DIAGNOSIS — F3289 Other specified depressive episodes: Secondary | ICD-10-CM | POA: Insufficient documentation

## 2013-10-27 DIAGNOSIS — I69998 Other sequelae following unspecified cerebrovascular disease: Secondary | ICD-10-CM | POA: Insufficient documentation

## 2013-10-27 DIAGNOSIS — R5381 Other malaise: Secondary | ICD-10-CM | POA: Insufficient documentation

## 2013-10-27 DIAGNOSIS — M549 Dorsalgia, unspecified: Secondary | ICD-10-CM | POA: Insufficient documentation

## 2013-10-27 DIAGNOSIS — B349 Viral infection, unspecified: Secondary | ICD-10-CM

## 2013-10-27 DIAGNOSIS — R5383 Other fatigue: Secondary | ICD-10-CM

## 2013-10-27 DIAGNOSIS — J069 Acute upper respiratory infection, unspecified: Secondary | ICD-10-CM | POA: Insufficient documentation

## 2013-10-27 LAB — CBC
HCT: 40.4 % (ref 36.0–46.0)
Hemoglobin: 13.4 g/dL (ref 12.0–15.0)
MCH: 29 pg (ref 26.0–34.0)
MCHC: 33.2 g/dL (ref 30.0–36.0)
MCV: 87.4 fL (ref 78.0–100.0)
Platelets: 296 10*3/uL (ref 150–400)
RBC: 4.62 MIL/uL (ref 3.87–5.11)
RDW: 13.3 % (ref 11.5–15.5)
WBC: 6.6 10*3/uL (ref 4.0–10.5)

## 2013-10-27 LAB — INFLUENZA PANEL BY PCR (TYPE A & B)
H1N1 flu by pcr: NOT DETECTED
INFLAPCR: NEGATIVE
INFLBPCR: NEGATIVE

## 2013-10-27 LAB — URINALYSIS, ROUTINE W REFLEX MICROSCOPIC
BILIRUBIN URINE: NEGATIVE
Glucose, UA: NEGATIVE mg/dL
Hgb urine dipstick: NEGATIVE
Ketones, ur: NEGATIVE mg/dL
NITRITE: NEGATIVE
PH: 5.5 (ref 5.0–8.0)
Protein, ur: NEGATIVE mg/dL
Specific Gravity, Urine: >1.03 — ABNORMAL HIGH (ref 1.005–1.030)
UROBILINOGEN UA: 0.2 mg/dL (ref 0.0–1.0)

## 2013-10-27 LAB — POCT I-STAT TROPONIN I: Troponin i, poc: 0 ng/mL (ref 0.00–0.08)

## 2013-10-27 LAB — BASIC METABOLIC PANEL
BUN: 17 mg/dL (ref 6–23)
CO2: 22 mEq/L (ref 19–32)
Calcium: 11.1 mg/dL — ABNORMAL HIGH (ref 8.4–10.5)
Chloride: 105 mEq/L (ref 96–112)
Creatinine, Ser: 0.74 mg/dL (ref 0.50–1.10)
GFR calc Af Amer: >90 mL/min (ref >90)
Glucose, Bld: 107 mg/dL — ABNORMAL HIGH (ref 70–99)
Potassium: 4.2 mEq/L (ref 3.7–5.3)
Sodium: 138 mEq/L (ref 137–147)

## 2013-10-27 LAB — RAPID URINE DRUG SCREEN, HOSP PERFORMED
Amphetamines: NOT DETECTED
BARBITURATES: NOT DETECTED
Benzodiazepines: NOT DETECTED
COCAINE: NOT DETECTED
Opiates: NOT DETECTED
TETRAHYDROCANNABINOL: NOT DETECTED

## 2013-10-27 LAB — URINE MICROSCOPIC-ADD ON

## 2013-10-27 LAB — DIFFERENTIAL
BASOS ABS: 0 10*3/uL (ref 0.0–0.1)
BASOS PCT: 0 % (ref 0–1)
EOS ABS: 0.1 10*3/uL (ref 0.0–0.7)
EOS PCT: 1 % (ref 0–5)
Lymphocytes Relative: 33 % (ref 12–46)
Lymphs Abs: 2.2 10*3/uL (ref 0.7–4.0)
MONOS PCT: 8 % (ref 3–12)
Monocytes Absolute: 0.5 10*3/uL (ref 0.1–1.0)
NEUTROS ABS: 3.8 10*3/uL (ref 1.7–7.7)
Neutrophils Relative %: 58 % (ref 43–77)

## 2013-10-27 LAB — PROTIME-INR
INR: 1.01 (ref 0.00–1.49)
Prothrombin Time: 13.1 seconds (ref 11.6–15.2)

## 2013-10-27 LAB — GLUCOSE, CAPILLARY: GLUCOSE-CAPILLARY: 87 mg/dL (ref 70–99)

## 2013-10-27 LAB — APTT: APTT: 26 s (ref 24–37)

## 2013-10-27 LAB — TROPONIN I: Troponin I: <0.3 ng/mL (ref <0.30)

## 2013-10-27 NOTE — ED Notes (Signed)
Pt reports flu-like symptoms since 9am today

## 2013-10-27 NOTE — ED Notes (Addendum)
Pt has nausea,without vomiting. Onset today.9am. Alert, talking, Pt concerned about having stroke in past and says these sx are like when she had a stroke, MAE color good  PERL, strong grips.

## 2013-10-27 NOTE — ED Provider Notes (Signed)
Medical screening examination/treatment/procedure(s) were conducted as a shared visit with non-physician practitioner(s) and myself.  I personally evaluated the patient during the encounter.  EKG Interpretation    Date/Time:  Monday October 27 2013 12:15:49 EST Ventricular Rate:  83 PR Interval:  158 QRS Duration: 88 QT Interval:  388 QTC Calculation: 455 R Axis:   74 Text Interpretation:  Normal sinus rhythm Normal ECG When compared with ECG of 06-Oct-2012 12:08, No significant change was found Confirmed by Guss Farruggia  DO, Yohana Bartha [phone]) on 10/27/2013 12:43:26 PM            Pt is a 58 y.o. female with history of hypertension, asthma, prior stroke with right upper extremity weakness at baseline who presents emergency department with flulike symptoms that started this morning at 9 AM. Patient reports that she began to feel unwell and had body aches and headache and runny nose. No fever. She reports she was concerned because when she had a stroke she had similar symptoms. At that time, patient does have right upper extremity weakness. She's not having any new weakness or numbness. On exam, patient is hemodynamically stable. She has no meningismus, heart and lung sounds normal, abdomen soft and nontender to palpation. She has mild right upper extremity weakness that is her baseline. His sensory deficit. Cranial nerves II through XII intact. Will obtain labs, urine, CT head given patient is concerned this is similar to her prior stroke although without new neurologic deficit, I feel this is more likely viral illness such as the flu.   2:23 PM  Pt labs, urine, head CT, chest x-ray, EKG are completely unremarkable. She still has no new neurologic deficit on exam. She has chronic right upper extremity weakness. Flu swab pending.  3:30 PM  Pt flu swab is negative. She still neurologically intact with only her chronic right upper extremity weakness. My suspicion for new stroke, TIA is very low given  patient never had neurologic deficits but rather viral symptoms. Have discussed with patient at length that I feel she is safe to be discharged home and to return to emergency department if she develops any focal neurologic deficits. Given supportive care instructions. Patient and husband at bedside verbalize understanding and are comfortable plan.  [place], DO 10/27/13 [phone]

## 2013-10-27 NOTE — Discharge Instructions (Signed)
Please call your doctor for a followup appointment within 24-48 hours. When you talk to your doctor please let them know that you were seen in the emergency department and have them acquire all of your records so that they can discuss the findings with you and formulate a treatment plan to fully care for your new and ongoing problems. Please rest and stay hydrated Please avoid any physical or strenuous activity You most likely have a viral illness - flu panel was negative. Please continue to monitor symptoms closely and if symptoms are to worsen or change (fever greater than 101, chills, sweating, neck pain, neck stiffness, slurred speech, facial drooping, difficulty swallowing, chest pain, shortness of breath, difficulty breathing, weakness to one side of the body, inability to pick up objects, inability to walk) please report back to the ED immediately  Upper Respiratory Infection, Adult An upper respiratory infection (URI) is also known as the common cold. It is often caused by a type of germ (virus). Colds are easily spread (contagious). You can pass it to others by kissing, coughing, sneezing, or drinking out of the same glass. Usually, you get better in 1 or 2 weeks.  HOME CARE   Only take medicine as told by your doctor.  Use a warm mist humidifier or breathe in steam from a hot shower.  Drink enough water and fluids to keep your pee (urine) clear or pale yellow.  Get plenty of rest.  Return to work when your temperature is back to normal or as told by your doctor. You may use a face mask and wash your hands to stop your cold from spreading. GET HELP RIGHT AWAY IF:   After the first few days, you feel you are getting worse.  You have questions about your medicine.  You have chills, shortness of breath, or brown or red spit (mucus).  You have yellow or brown snot (nasal discharge) or pain in the face, especially when you bend forward.  You have a fever, puffy (swollen) neck, pain  when you swallow, or white spots in the back of your throat.  You have a bad headache, ear pain, sinus pain, or chest pain.  You have a high-pitched whistling sound when you breathe in and out (wheezing).  You have a lasting cough or cough up blood.  You have sore muscles or a stiff neck. MAKE SURE YOU:   Understand these instructions.  Will watch your condition.  Will get help right away if you are not doing well or get worse. Document Released: 03/13/2008 Document Revised: 12/18/2011 Document Reviewed: 01/30/2011 [facility] Patient Information 2014 [place], [place].   Emergency Department Resource Guide 1) Find a Doctor and Pay Out of Pocket Although you won't have to find out who is covered by your insurance plan, it is a good idea to ask around and get recommendations. You will then need to call the office and see if the doctor you have chosen will accept you as a new patient and what types of options they offer for patients who are self-pay. Some doctors offer discounts or will set up payment plans for their patients who do not have insurance, but you will need to ask so you aren't surprised when you get to your appointment.  2) Contact Your Local Health Department Not all health departments have doctors that can see patients for sick visits, but many do, so it is worth a call to see if yours does. If you don't know where your local health  department is, you can check in your phone book. The CDC also has a tool to help you locate your state's health department, and many state websites also have listings of all of their local health departments.  3) Find a [facility] If your illness is not likely to be very severe or complicated, you may want to try a walk in clinic. These are popping up all over the country in pharmacies, drugstores, and shopping centers. They're usually staffed by nurse practitioners or physician assistants that have been trained to treat common illnesses and  complaints. They're usually fairly quick and inexpensive. However, if you have serious medical issues or chronic medical problems, these are probably not your best option.  No Primary Care Doctor: - Call Health Connect at  [phone] - they can help you locate a primary care doctor that  accepts your insurance, provides certain services, etc. - Physician Referral Service- [phone]  Chronic Pain Problems: Organization         Address  Phone   Notes  [facility]  [phone] Patients need to be referred by their primary care doctor.   Medication Assistance: Organization         Address  Phone   Notes  [facility] Medication [facility] [address] [place], [place] [address] [phone] --Must be a resident of [facility] -- Must have NO insurance coverage whatsoever (no Medicaid/ Medicare, etc.) -- The pt. MUST have a primary care doctor that directs their care regularly and follows them in the community   Med[facility]ist  [phone]   [employer]  [phone]    Agencies that provide inexpensive medical care: Organization         Address  Phone   Notes  [place]  [phone]   [facility]    [phone]   [facility] [place], [place] [address] [phone]   [place] [address] [place] [phone]   Planned Parenthood    [phone]   [facility]    [phone]   [place] [address], [place] Phone:  [phone], Fax:  [phone] Hours of Operation:  9 am - 6 pm, M-F.  Also accepts Medicaid/Medicare and self-pay.  [facility] for [place], [address], [place] Phone: [phone], Fax: [phone]. Hours of Operation:  8:30 am - 5:30 pm, M-F.  Also accepts Medicaid and self-pay.  [facility] [place] [address], [place] Phone: [phone]   [place], [place], [place] [phone], Ext. [phone] Mondays & Thursdays: 7-9 AM.  First 15 patients are seen on a first come, first serve basis.    [place] Providers:  Organization         Address  Phone   Notes  [facility] [address], [place] [phone] Also accepts self-pay patients.  [place], [place]  [phone]   [place], [address], [place] [phone]   [facility] [address] [place] [phone]   Lucianne Lei [address], [place]   [phone] Only accepts [place] Access [place] patients after they have  their name applied to their card.   Self-Pay (no insurance) in [facility]:  Organization         Address  Phone   Notes  Sickle Cell Patients, [facility] Internal [facility] [place] [phone]   [facility] [place] [phone]   [facility] [place]  [place], [place], [place] [phone]   [facility]/Dr. Osei-Bonsu  [address], [place] or [address], [place] [phone] Phone number for both [place] and [place] locations is the same.  Urgent Medical and [facility] [address] [place] [phone]   [facility] [address] [place] or [address] [phone]   [facility] [address] [place] [phone], phone; [phone], fax Sees patients 1st and 3rd Saturday of every month.  Must not qualify for public or private insurance (i.e. Medicaid, Medicare, [place] Health Choice, Veterans' Benefits)  Household income should be no more than 200% of the poverty level  The clinic cannot treat you if you are pregnant or think you are pregnant  Sexually transmitted diseases are not treated at the clinic.    Dental Care: Organization         Address  Phone  Notes  [facility] Department of [facility] [place] [phone] Accepts children up to age 10 who are enrolled in [place] or [place]; pregnant women with a Medicaid card; and children who have applied for Medicaid or [place] Health Choice, but were declined, whose parents can pay a reduced fee at time of service.  [facility] Department of [facility]  [address], [place] [phone] Accepts children up to age 57 who are enrolled in [place] or [place]; pregnant women with a Medicaid card; and children who have applied for Medicaid or  Health Choice, but were declined, whose parents can pay a reduced fee at time of service.  [place] Adult Dental Access PROGRAM  [place] [phone] Patients are seen by appointment only. Walk-ins are not accepted. [place] will see patients 6 years of age and older. Monday - Tuesday (8am-5pm) Most Wednesdays (8:30-5pm) $30 per visit, cash only  [facility] Adult Dental Access PROGRAM  [address], [facility] [phone] Patients are seen by appointment only. Walk-ins are not accepted. Between will see patients 31 years of age and older. One Wednesday Evening (Monthly: Volunteer Based).  $30 per visit, cash only  [place]  [phone] for adults; Children under age 25, call Graduate Pediatric Dentistry at [phone]. Children aged 26-14, please call [phone] to request a pediatric application.  Dental services are provided in all areas of dental care including fillings, crowns and bridges, complete and partial dentures, implants, gum treatment, root canals, and extractions. Preventive care is  also provided. Treatment is provided to both adults and children. Patients are selected via a lottery and there is often a waiting list.   [facility] [address] [place]  [phone] [email or web address], [place], [place] [phone], Ext. [phone] Second and Fourth Thursday of each month, opens at 6:30 AM; Clinic ends at 9 AM.  Patients are seen on  a [employer] basis, and a limited number are seen during each clinic.   [facility]  [address] Hillard Danker [place], [place] [phone]   Eligibility Requirements You must have lived in [place], [place], or [place] counties for at least the last three months.   You cannot be eligible for state or federal sponsored [employer], including [employer], [place], or [employer].   You generally cannot be eligible for healthcare insurance through your employer.    How to apply: Eligibility screenings are held every Tuesday and Wednesday afternoon from 1:00 pm until 4:00 pm. You do not need an appointment for the interview!  [facility] [address] [place], [place]   [place]  [place] Department  [place]  [phone]    Behavioral Health Resources in the Community: Intensive Outpatient Programs Organization         Address  Phone  Notes  [place]. [address], [place], [place] [phone]   [facility] [address], [place], [place]   ADS: Alcohol & Drug Svcs [address] [place], [place]   [place] [address]  [place], [place] or [phone]   Substance Abuse Resources Organization         Address  Phone  Notes  Alcohol and Drug Services  [phone]   [place]  [phone]   [facility] [place]   Chinita Pester  [phone]   Residential & Outpatient Substance Abuse Program  [phone]   Psychological Services Organization         Address  Phone  Notes  [facility] (2-Rh) [place]  [place]  [phone]   [place] [address] [place] or [phone]    Mobile Crisis Teams Organization         Address  Phone  Notes  [facility], Mobile Crisis Care Unit  [phone]   [facility]  [address] [place], [place]   [place] [address] [place] [phone]    Self-Help/Support Groups Organization         Address  Phone             Notes  [place] of [place] - variety of support groups  [place] Call for more information  Narcotics Anonymous (NA), [facility] [address], [employer] [place]  2 meetings at this location   Special educational needs teacher         Address  Phone  Notes  ASAP Residential Treatment [place],    [place]  [phone]   [facility]  [address] [place] [id number], [place], [place]   [place], [place] [phone] Admissions: 8am-3pm M-F  Incentives Substance [place] [address],    [place], [place] [phone]   [facility] [address] Jadene Pierini [place], [place]   [facility] [address],  [place], [place]   Insight Programs - Intensive Outpatient [address] Krist[address], [place], [place]   [facility] ([place].) [place].,  [place], [place] or [phone]   [facility] Services (RTS) [address], [place], [place]  Accepts Medicaid  [facility] [address]  [place] [phone]  Substance Abuse/Addiction Treatment   [facility] Organization         Address  Phone  Notes  [facility]  [phone]   Domenic Schwab, PhD [address] [place], [place]   [phone] or [phone]   [address] Michael [place], [place] [phone]   [address], [place], [place] [phone] Insurance/Medicaid/sponsorship through [facility] and Families [address] [place]                                    [place], [place] [phone] [place] [address], [place] [phone]    Dr. Adele Schilder  [phone]   [facility] Dept. 1) [address] [place] 2) [place] 3)  [place], [place] [phone]  [phone]   [place] [phone] or [phone] (After Hours)

## 2013-10-27 NOTE — ED Notes (Signed)
Patient complains of generalized weakness, headache, and "just feeling awful." Denies any nausea, vomiting, or diarrhea. Patient states she has a history of stroke and the symptoms wer about the same as they are now. "I felt the same way when I had my stroke before, but these symptoms seem more mild."

## 2013-10-27 NOTE — ED Provider Notes (Signed)
CSN: [id number]     Arrival date & time 10/27/13  1003 History   First MD Initiated Contact with Patient 10/27/13 1115     Chief Complaint  Patient presents with  . Influenza   (Consider location/radiation/quality/duration/timing/severity/associated sxs/prior Treatment) The history is provided by the patient. No language interpreter was used.  Teresa Soto is a 58 y/o female past medical history vertigo, hypertension, asthma, depression, stroke in December 2013, chronic back pain presenting to emergency department with flulike symptoms - reported that when she had her first stroke in December 2013 she presented with flu-like symptoms. Patient reported this morning at approximately 9:00 a.m. she just felt "lousy." Patient reported that she's been having a mild dull headache, feeling nauseous, dry mouth, cough, nasal congestion that has been ongoing since this morning. Patient reported that she feels like she's been having the flu. Reported that she's been having increased weakness localized to the right side-reports that she has been suffering from right-sided weakness secondary to her stroke in December of 2013, reported that this occurs every once in a while she gets increased weakness that side since this event. Patient reports that she's been using nothing for the discomfort. Patient reported that she is concerned about having a stroke because she had similar symptoms in December 2013. Denied chest pain, shortness of breath, difficulty breathing, difficulty swallowing, visual changes, numbness, tingling, loss of sensation. PCP Dr. Buelah Manis  Past Medical History  Diagnosis Date  . Vertigo   . Hypertension   . Asthma   . Neuropathic pain   . Restless legs syndrome 2007 approx  . Depression   . Stroke   . Chronic back pain    Past Surgical History  Procedure Laterality Date  . Back surgery    . Tonsillectomy    . Cesarean section      x 2  . Shoulder arthroscopy w/ rotator cuff repair       to right shoulder  . Tubal ligation    . Spine surgery  12/2010    ruptd L1 L2 , Dr Carloyn Manner   No family history on file. History  Substance Use Topics  . Smoking status: Former Research scientist (life sciences)  . Smokeless tobacco: Not on file  . Alcohol Use: No   OB History   Grav Para Term Preterm Abortions TAB SAB Ect Mult Living                 Review of Systems  Constitutional: Negative for fever and chills.  HENT: Positive for congestion. Negative for trouble swallowing.   Respiratory: Positive for cough. Negative for chest tightness and shortness of breath.   Gastrointestinal: Negative for nausea, vomiting, abdominal pain and diarrhea.  Musculoskeletal: Positive for back pain and myalgias.  Neurological: Negative for dizziness and weakness.  All other systems reviewed and are negative.    Allergies  Other and Influenza vaccines  Home Medications   Current Outpatient Rx  Name  Route  Sig  Dispense  Refill  . aspirin EC 81 MG tablet   Oral   Take 81 mg by mouth every evening.         . cetirizine (ZYRTEC) 10 MG tablet   Oral   Take 10 mg by mouth every evening.          . escitalopram (LEXAPRO) 20 MG tablet   Oral   Take 1 tablet (20 mg total) by mouth daily.   30 tablet   6   . lisinopril (PRINIVIL,ZESTRIL) 10  MG tablet      TAKE ONE TABLET BY MOUTH ONCE DAILY   30 tablet   2   . Melatonin 10 MG TABS   Oral   Take 1 tablet by mouth at bedtime.         . montelukast (SINGULAIR) 10 MG tablet      TAKE ONE TABLET BY MOUTH AT BEDTIME   30 tablet   2   . naproxen sodium (ALEVE) 220 MG tablet   Oral   Take 440 mg by mouth 2 (two) times daily as needed (pain).          . pramipexole (MIRAPEX) 0.25 MG tablet      TAKE ONE TABLET BY MOUTH ONCE DAILY AT BEDTIME   30 tablet   2   . albuterol (PROVENTIL HFA;VENTOLIN HFA) 108 (90 BASE) MCG/ACT inhaler   Inhalation   Inhale 2 puffs into the lungs every 6 (six) hours as needed. Shortness of Breath   1 Inhaler   2     BP 128/82  Pulse 90  Temp(Src) 98.6 F (37 C) (Oral)  Resp 18  Ht 5\' 1"  (1.549 m)  Wt 156 lb (70.761 kg)  BMI 29.49 kg/m2  SpO2 97% Physical Exam  Nursing note and vitals reviewed. Constitutional: She is oriented to person, place, and time. She appears well-developed and well-nourished. No distress.  Patient sitting upright in bed no acute distress identified  HENT:  Head: Normocephalic and atraumatic.  Mouth/Throat: Oropharynx is clear and moist. No oropharyngeal exudate.  Uvula midline, symmetrical elevation Negative trismus  Eyes: Conjunctivae and EOM are normal. Pupils are equal, round, and reactive to light. Right eye exhibits no discharge. Left eye exhibits no discharge.  Negative nystagmus  Neck: Normal range of motion. Neck supple. No tracheal deviation present.  Negative neck stiffness Negative nuchal rigidity Negative cervical lymphadenopathy Negative meningeal signs  Cardiovascular: Normal rate, regular rhythm and normal heart sounds.  Exam reveals no friction rub.   No murmur heard. Pulses:      Radial pulses are 2+ on the right side, and 2+ on the left side.       Dorsalis pedis pulses are 2+ on the right side, and 2+ on the left side.  Pulmonary/Chest: Effort normal and breath sounds normal. No respiratory distress. She has no wheezes. She has no rales.  Musculoskeletal: Normal range of motion. She exhibits no tenderness.  Full ROM to upper and lower extremities without difficulty noted, negative ataxia noted.  Lymphadenopathy:    She has no cervical adenopathy.  Neurological: She is alert and oriented to person, place, and time. No cranial nerve deficit. She exhibits normal muscle tone. Coordination normal.  Cranial nerves III-XII grossly intact Negative facial drooping Strength 5+/5+ to upper and lower extremities bilaterally with resistance applied - mild weakness inoted to the right upper and lower extremities, this is been a consequence secondary to stroke  back in December 2013, chronic issue Sensation intact with differentiation to sharp and dull touch - mild difficulty to differentiation to tips of the fingers of the right hand - patient reported that this is normal for her after her stroke in 09/2012. Negative arm drift Fine motor skills intact Gait proper, proper balance - negative sway, negative drift, negative step-offs Patient is able to walk on to her toes and heel to toe without difficulty Speech proper - negative slurred speech or mumbling  Skin: Skin is warm and dry. No rash noted. She is not diaphoretic. No  erythema.  Psychiatric: She has a normal mood and affect. Her behavior is normal. Thought content normal.    ED Course  Procedures (including critical care time)  12:09 PM Dr. Raliegh Ip. Ward to see patient. Attending physician recommended patient to be moved to main ED for further assessment and monitoring.   3:06 PM Patient re-assessed and full neuro exam performed again. Negative new neurological deficits on repeat neuro exam. Discussed with patient labs and imagine results. At this time no reason for MRI to be performed. Discussed plan for discharge with patient. Patient agreed.   Results for orders placed during the hospital encounter of 10/27/13  INFLUENZA PANEL BY PCR (TYPE A & B, H1N1)      Result Value Range   Influenza A By PCR NEGATIVE  NEGATIVE   Influenza B By PCR NEGATIVE  NEGATIVE   H1N1 flu by pcr NOT DETECTED  NOT DETECTED  PROTIME-INR      Result Value Range   Prothrombin Time 13.1  11.6 - 15.2 seconds   INR 1.01  0.00 - 1.49  APTT      Result Value Range   aPTT 26  24 - 37 seconds  CBC      Result Value Range   WBC 6.6  4.0 - 10.5 K/uL   RBC 4.62  3.87 - 5.11 MIL/uL   Hemoglobin 13.4  12.0 - 15.0 g/dL   HCT 40.4  36.0 - 46.0 %   MCV 87.4  78.0 - 100.0 fL   MCH 29.0  26.0 - 34.0 pg   MCHC 33.2  30.0 - 36.0 g/dL   RDW 13.3  11.5 - 15.5 %   Platelets 296  150 - 400 K/uL  DIFFERENTIAL      Result Value  Range   Neutrophils Relative % 58  43 - 77 %   Neutro Abs 3.8  1.7 - 7.7 K/uL   Lymphocytes Relative 33  12 - 46 %   Lymphs Abs 2.2  0.7 - 4.0 K/uL   Monocytes Relative 8  3 - 12 %   Monocytes Absolute 0.5  0.1 - 1.0 K/uL   Eosinophils Relative 1  0 - 5 %   Eosinophils Absolute 0.1  0.0 - 0.7 K/uL   Basophils Relative 0  0 - 1 %   Basophils Absolute 0.0  0.0 - 0.1 K/uL  TROPONIN I      Result Value Range   Troponin I <0.30  <0.30 ng/mL  URINE RAPID DRUG SCREEN (HOSP PERFORMED)      Result Value Range   Opiates NONE DETECTED  NONE DETECTED   Cocaine NONE DETECTED  NONE DETECTED   Benzodiazepines NONE DETECTED  NONE DETECTED   Amphetamines NONE DETECTED  NONE DETECTED   Tetrahydrocannabinol NONE DETECTED  NONE DETECTED   Barbiturates NONE DETECTED  NONE DETECTED  URINALYSIS, ROUTINE W REFLEX MICROSCOPIC      Result Value Range   Color, Urine YELLOW  YELLOW   APPearance CLEAR  CLEAR   Specific Gravity, Urine >1.030 (*) 1.005 - 1.030   pH 5.5  5.0 - 8.0   Glucose, UA NEGATIVE  NEGATIVE mg/dL   Hgb urine dipstick NEGATIVE  NEGATIVE   Bilirubin Urine NEGATIVE  NEGATIVE   Ketones, ur NEGATIVE  NEGATIVE mg/dL   Protein, ur NEGATIVE  NEGATIVE mg/dL   Urobilinogen, UA 0.2  0.0 - 1.0 mg/dL   Nitrite NEGATIVE  NEGATIVE   Leukocytes, UA SMALL (*) NEGATIVE  BASIC METABOLIC PANEL  Result Value Range   Sodium 138  137 - 147 mEq/L   Potassium 4.2  3.7 - 5.3 mEq/L   Chloride 105  96 - 112 mEq/L   CO2 22  19 - 32 mEq/L   Glucose, Bld 107 (*) 70 - 99 mg/dL   BUN 17  6 - 23 mg/dL   Creatinine, Ser 0.74  0.50 - 1.10 mg/dL   Calcium 11.1 (*) 8.4 - 10.5 mg/dL   GFR calc non Af Amer >90  >90 mL/min   GFR calc Af Amer >90  >90 mL/min  GLUCOSE, CAPILLARY      Result Value Range   Glucose-Capillary 87  70 - 99 mg/dL  URINE MICROSCOPIC-ADD ON      Result Value Range   Squamous Epithelial / LPF FEW (*) RARE   WBC, UA 3-6  <3 WBC/hpf  POCT I-STAT TROPONIN I      Result Value Range    Troponin i, poc 0.00  0.00 - 0.08 ng/mL   Comment 3            Dg Chest 2 View  10/27/2013   CLINICAL DATA:  Influenza.  Cough.  Asthma.  EXAM: CHEST  2 VIEW  COMPARISON:  10/06/2012  FINDINGS: The heart size and mediastinal contours are within normal limits. Both lungs are clear. The visualized skeletal structures are unremarkable.  IMPRESSION: No active cardiopulmonary disease.   Electronically Signed   By: Earle Gell M.D.   On: 10/27/2013 11:47   Ct Head Wo Contrast  10/27/2013   CLINICAL DATA:  Left arm weakness.  EXAM: CT HEAD WITHOUT CONTRAST  TECHNIQUE: Contiguous axial images were obtained from the base of the skull through the vertex without intravenous contrast.  COMPARISON:  October 06, 2012  FINDINGS: There is no midline shift, hydrocephalus, or mass. No acute hemorrhage or acute transcortical infarct is identified. The bony calvarium is intact. The visualized sinuses are clear.  IMPRESSION: No focal acute intracranial abnormality identified.   Electronically Signed   By: Abelardo Diesel M.D.   On: 10/27/2013 13:39   Labs Review Labs Reviewed  URINALYSIS, ROUTINE W REFLEX MICROSCOPIC - Abnormal; Notable for the following:    Specific Gravity, Urine >1.030 (*)    Leukocytes, UA SMALL (*)    All other components within normal limits  BASIC METABOLIC PANEL - Abnormal; Notable for the following:    Glucose, Bld 107 (*)    Calcium 11.1 (*)    All other components within normal limits  URINE MICROSCOPIC-ADD ON - Abnormal; Notable for the following:    Squamous Epithelial / LPF FEW (*)    All other components within normal limits  INFLUENZA PANEL BY PCR (TYPE A & B, H1N1)  PROTIME-INR  APTT  CBC  DIFFERENTIAL  TROPONIN I  URINE RAPID DRUG SCREEN (HOSP PERFORMED)  GLUCOSE, CAPILLARY  POCT I-STAT TROPONIN I   Imaging Review Dg Chest 2 View  10/27/2013   CLINICAL DATA:  Influenza.  Cough.  Asthma.  EXAM: CHEST  2 VIEW  COMPARISON:  10/06/2012  FINDINGS: The heart size and  mediastinal contours are within normal limits. Both lungs are clear. The visualized skeletal structures are unremarkable.  IMPRESSION: No active cardiopulmonary disease.   Electronically Signed   By: Earle Gell M.D.   On: 10/27/2013 11:47   Ct Head Wo Contrast  10/27/2013   CLINICAL DATA:  Left arm weakness.  EXAM: CT HEAD WITHOUT CONTRAST  TECHNIQUE: Contiguous axial images were obtained from  the base of the skull through the vertex without intravenous contrast.  COMPARISON:  October 06, 2012  FINDINGS: There is no midline shift, hydrocephalus, or mass. No acute hemorrhage or acute transcortical infarct is identified. The bony calvarium is intact. The visualized sinuses are clear.  IMPRESSION: No focal acute intracranial abnormality identified.   Electronically Signed   By: Abelardo Diesel M.D.   On: 10/27/2013 13:39    EKG Interpretation    Date/Time:  Monday October 27 2013 12:15:49 EST Ventricular Rate:  83 PR Interval:  158 QRS Duration: 88 QT Interval:  388 QTC Calculation: 455 R Axis:   74 Text Interpretation:  Normal sinus rhythm Normal ECG When compared with ECG of 06-Oct-2012 12:08, No significant change was found Confirmed by WARD  DO, KRISTEN ([id number]) on 10/27/2013 12:43:26 PM            MDM   1. URI, acute   2. Viral illness    Medications - No data to display  Filed Vitals:   10/27/13 1037 10/27/13 1440  BP: 132/82 128/82  Pulse: 82 90  Temp: 98.6 F (37 C)   TempSrc: Oral   Resp: 20 18  Height: 5\' 1"  (1.549 m)   Weight: 156 lb (70.761 kg)   SpO2: 99% 97%    Patient presenting to emergency department with cough, nausea, dry mouth, headache, nasal congestion that started this morning approximately 9:00AM.  As per patient, reported that this feels very similar to the stroke that she had back in December 2013. Reported that she's been having increased weakness localized to the right side-reported that this occurs intermittently since her stroke in 2013. Alert and  oriented. GCS 15. Heart rate and rhythm normal. Lungs clear to auscultation. Radial and DP pulses 2+ bilaterally. Uvula midline, symmetrical elevation. Unremarkable oral exam. PERRLA -negative nystagmus. Cranial nerves grossly intact. Negative facial drooping identified. Full range of motion to upper and lower extremities bilaterally without difficulty or ataxia noted. Strength intact to upper and lower extremities bilaterally-mildly decreased on the right side, this has been present since stroke in 2013. Good grip strength. Fine motor skills intact. Sensation intact with differentiation to sharp and dull touch - mildly difficulty with differentiation noted to the finger tips of the right hand - present since patient had stroke in 09/2012. Gait proper, proper balance-negative sway. Patient able to walk on to her toes and heel to toe without difficulty. Negative slurred speech, patient speech is comprehensible. No new neurological deficits identified. Patient seen and assessed by physician who recommended neuro monitoring.  EKG negative acute ischemic findings - negative new changes noted. Troponin negative elevation. CBC negative findings. CMP negative findings. INR and aPTT negative findings. Glucose 87. UA negative for infection - negative pyuria, negative nitrites. Influenza panel negative. Urine drug screen negative. Chest xray negative findings. CT head negative acute findings. Passed neuro screen swallow.  Patient monitored in the ED setting for more than 4 hours. Patient did not develop any new neurological deficits. Patient has chronic right sided neuro deficits since stroke in 09/2012. Unremarkable labs and imaging results. Patient seen and assessed by attending physician. Dr. Raliegh Ip. Ward who cleared patient for discharge. Doubt TIA, doubt new stroke - no MRI needed at this time. Suspicion to be viral illness/syndrome leading to symptoms. Patient stable, afebrile. Patient neurovascularly intact with negative  new neurological deficits noted. Patient discharged home. Discussed with patient to rest and stay hydrated. Educated patient and husband symptoms to watch out for regarding stroke/TIA -  discussed that if these symptoms occur she is to report back to the ED immediately. Referred patient to PCP to ne re-assessed. Discussed with patient to closely monitor symptoms and if symptoms are to worsen or change to report back to the ED - strict return instructions given.  Patient agreed to plan of care, understood, all questions answered.     Jamse Mead, PA-C 10/27/13 1947

## 2013-10-27 NOTE — ED Notes (Signed)
Patient transported to CT 

## 2013-11-10 ENCOUNTER — Encounter: Payer: BC Managed Care – PPO | Admitting: Family Medicine

## 2013-11-20 ENCOUNTER — Ambulatory Visit (INDEPENDENT_AMBULATORY_CARE_PROVIDER_SITE_OTHER): Payer: BC Managed Care – PPO | Admitting: Otolaryngology

## 2013-11-20 DIAGNOSIS — H9319 Tinnitus, unspecified ear: Secondary | ICD-10-CM

## 2013-11-20 DIAGNOSIS — H8109 Meniere's disease, unspecified ear: Secondary | ICD-10-CM

## 2013-11-20 DIAGNOSIS — H905 Unspecified sensorineural hearing loss: Secondary | ICD-10-CM

## 2013-11-26 ENCOUNTER — Encounter: Payer: BC Managed Care – PPO | Admitting: Family Medicine

## 2013-11-26 ENCOUNTER — Ambulatory Visit (INDEPENDENT_AMBULATORY_CARE_PROVIDER_SITE_OTHER): Payer: BC Managed Care – PPO | Admitting: Family Medicine

## 2013-11-26 ENCOUNTER — Encounter: Payer: Self-pay | Admitting: Family Medicine

## 2013-11-26 VITALS — BP 110/70 | HR 78 | Temp 99.0°F | Resp 18 | Ht 61.0 in | Wt 160.5 lb

## 2013-11-26 DIAGNOSIS — I10 Essential (primary) hypertension: Secondary | ICD-10-CM

## 2013-11-26 DIAGNOSIS — Z0000 Encounter for general adult medical examination without abnormal findings: Secondary | ICD-10-CM

## 2013-11-26 DIAGNOSIS — E785 Hyperlipidemia, unspecified: Secondary | ICD-10-CM

## 2013-11-26 DIAGNOSIS — E669 Obesity, unspecified: Secondary | ICD-10-CM

## 2013-11-26 DIAGNOSIS — E210 Primary hyperparathyroidism: Secondary | ICD-10-CM

## 2013-11-26 DIAGNOSIS — E559 Vitamin D deficiency, unspecified: Secondary | ICD-10-CM

## 2013-11-26 DIAGNOSIS — Z124 Encounter for screening for malignant neoplasm of cervix: Secondary | ICD-10-CM

## 2013-11-26 NOTE — Patient Instructions (Addendum)
Call to set up your Mammogram-- [phone] Try the Livalo 1/2 tablet at bedtime- call if you do not have any difficult with the medication Continue all other medications Referral for colonoscopy F/U 3 months

## 2013-11-26 NOTE — Assessment & Plan Note (Signed)
BP improved continue lisinopril

## 2013-11-26 NOTE — Assessment & Plan Note (Signed)
Needs to improve diet and exercise,

## 2013-11-26 NOTE — Progress Notes (Signed)
Patient ID: Teresa Soto, female   DOB: 12-05-1955, 58 y.o.   MRN: [id number]     Subjective:    Patient ID: Teresa Soto, female    DOB: 1955-10-22, 58 y.o.   MRN: [id number]  Patient presents for Annual Exam  Patient here for physical exam. She's no specific concerns today. She was seen by ear nose and throat was diagnosed with Mnire's disease. Her last Pap smear was in 2012 which was normal she has been postmenopausal since age 89. She's not had a mammogram since 2010. She is overdue for colonoscopy.  Immunizations up to date.  Fasting labs reviewed hyperlipidemia has worsened in setting of her recent stroke. Also noted to have hypercalcemia over the past few blood tests  Review Of Systems:  GEN- denies fatigue, fever, weight loss,weakness, recent illness HEENT- denies eye drainage, change in vision, nasal discharge, CVS- denies chest pain, palpitations RESP- denies SOB, cough, wheeze ABD- denies N/V, change in stools, abd pain GU- denies dysuria, hematuria, dribbling, incontinence MSK- denies joint pain, muscle aches, injury Neuro- denies headache, dizziness, syncope, seizure activity       Objective:    BP 110/70  Pulse 78  Temp(Src) 99 F (37.2 C) (Oral)  Resp 18  Ht 5\' 1"  (1.549 m)  Wt 160 lb 8 oz (72.802 kg)  BMI 30.34 kg/m2 GEN- NAD, alert and oriented x3 HEENT- PERRL, EOMI, non injected sclera, pink conjunctiva, MMM, oropharynx clear Neck- Supple, no thyromegaly Breast- normal symmetry, no nipple inversion,no nipple drainage, no nodules or lumps felt Nodes- no axillary nodes CVS- RRR, no murmur RESP-CTAB ABD-NABS,soft,NT,ND GU- normal external genitalia, vaginal mucosa pink and moist, cervix visualized no growth/ stenotic os, no blood form os, No discharge, no CMT, no ovarian masses, uterus normal size Rectum- normal tone, external skin tags, FOBT neg EXT- No edema Pulses- Radial, DP- 2+        Assessment & Plan:      Problem List Items Addressed  This Visit   Obesity, unspecified     Needs to improve diet and exercise,     HYPERTENSION     BP improved continue lisinopril    Relevant Medications      Pitavastatin Calcium (LIVALO) 4 MG TABS   HYPERLIPIDEMIA     Trial of livaol, 2mg  once a day Strong family history of Hyperlipidemia, I think there is a genetic component to her hyperlipidemia     Other Visit Diagnoses   Routine general medical examination at a health care facility    -  Primary    PAP Smear done, some cervical stenosis encoutered, pt to set up Mammo, refer for colonoscopy    Hypercalcemia        Check PTH, VIT D, calcium    Relevant Orders       PTH, Intact and Calcium    Unspecified vitamin D deficiency        recheck VIt D levels, treated last year    Relevant Orders       Vitamin D, 25-hydroxy    Screening for malignant neoplasm of the cervix        Relevant Orders       PAP, ThinPrep ASCUS Rflx HPV Rflx Type       Note: This dictation was prepared with Dragon dictation along with smaller phrase technology. Any transcriptional errors that result from this process are unintentional.

## 2013-11-26 NOTE — Assessment & Plan Note (Signed)
Trial of livaol, 2mg  once a day Strong family history of Hyperlipidemia, I think there is a genetic component to her hyperlipidemia

## 2013-11-27 LAB — PAP THINPREP ASCUS RFLX HPV RFLX TYPE

## 2013-11-27 LAB — VITAMIN D 25 HYDROXY (VIT D DEFICIENCY, FRACTURES): VIT D 25 HYDROXY: 18 ng/mL — AB (ref 30–89)

## 2013-11-27 LAB — PTH, INTACT AND CALCIUM
CALCIUM: 11.4 mg/dL — AB (ref 8.4–10.5)
PTH: 209.8 pg/mL — ABNORMAL HIGH (ref 14.0–72.0)

## 2013-11-28 MED ORDER — VITAMIN D (ERGOCALCIFEROL) 1.25 MG (50000 UNIT) PO CAPS: 50000.0000 [IU] | ORAL_CAPSULE | ORAL | Status: DC

## 2013-11-28 NOTE — Addendum Note (Signed)
Addended by: Vic Blackbird F on: 11/28/2013 01:22 PM   Modules accepted: Orders

## 2013-12-05 NOTE — Progress Notes (Signed)
Patient contacted and made aware. 

## 2013-12-11 ENCOUNTER — Other Ambulatory Visit (HOSPITAL_COMMUNITY): Payer: Self-pay | Admitting: Neurosurgery

## 2013-12-11 DIAGNOSIS — M5126 Other intervertebral disc displacement, lumbar region: Secondary | ICD-10-CM

## 2013-12-15 ENCOUNTER — Ambulatory Visit (HOSPITAL_COMMUNITY)
Admission: RE | Admit: 2013-12-15 | Discharge: 2013-12-15 | Disposition: A | Discharge status: Home / Self Care | Payer: BC Managed Care – PPO | Source: Ambulatory Visit | Attending: Neurosurgery | Admitting: Neurosurgery

## 2013-12-15 DIAGNOSIS — M545 Low back pain, unspecified: Secondary | ICD-10-CM | POA: Insufficient documentation

## 2013-12-15 DIAGNOSIS — M5126 Other intervertebral disc displacement, lumbar region: Secondary | ICD-10-CM

## 2013-12-29 ENCOUNTER — Encounter: Payer: Self-pay | Admitting: *Deleted

## 2014-01-22 ENCOUNTER — Ambulatory Visit (INDEPENDENT_AMBULATORY_CARE_PROVIDER_SITE_OTHER): Payer: BC Managed Care – PPO | Admitting: Otolaryngology

## 2014-01-26 ENCOUNTER — Encounter: Payer: Self-pay | Admitting: Family Medicine

## 2014-01-26 ENCOUNTER — Ambulatory Visit (INDEPENDENT_AMBULATORY_CARE_PROVIDER_SITE_OTHER): Payer: BC Managed Care – PPO | Admitting: Family Medicine

## 2014-01-26 VITALS — BP 136/78 | HR 76 | Temp 98.4°F | Resp 14 | Ht 62.0 in | Wt 165.0 lb

## 2014-01-26 DIAGNOSIS — J309 Allergic rhinitis, unspecified: Secondary | ICD-10-CM

## 2014-01-26 DIAGNOSIS — J302 Other seasonal allergic rhinitis: Secondary | ICD-10-CM

## 2014-01-26 DIAGNOSIS — H109 Unspecified conjunctivitis: Secondary | ICD-10-CM

## 2014-01-26 MED ORDER — POLYMYXIN B-TRIMETHOPRIM 10000-0.1 UNIT/ML-% OP SOLN: 1.0000 [drp] | Freq: Four times a day (QID) | OPHTHALMIC | Status: DC

## 2014-01-26 MED ORDER — VITAMIN D (ERGOCALCIFEROL) 1.25 MG (50000 UNIT) PO CAPS: 50000.0000 [IU] | ORAL_CAPSULE | ORAL | Status: DC

## 2014-01-26 MED ORDER — FLUTICASONE PROPIONATE 50 MCG/ACT NA SUSP: 2.0000 | Freq: Every day | NASAL | Status: DC

## 2014-01-26 NOTE — Progress Notes (Signed)
Patient ID: Teresa Soto, female   DOB: Nov 20, 1955, 58 y.o.   MRN: [id number]   Subjective:    Patient ID: Teresa Soto, female    DOB: 08-28-1956, 58 y.o.   MRN: [id number]  Patient presents for eye irritation  Patient here with left eye irritation discomfort for the past 24 hours. She's concerned she has conjunctivitis. She works in [employer] and handles a lot of money issues concerned about germs. She does note that her eye was matted this morning. She's not had any change in her vision. She also has seasonal allergies and has been taking Zyrtec but has been clogged up more the past couple weeks. She feels like she cannot breathe out of her nose at night. Denies any cough    Review Of Systems:  GEN- denies fatigue, fever, weight loss,weakness, recent illness HEENT- +eye drainage, change in vision, +nasal discharge, CVS- denies chest pain, palpitations RESP- denies SOB, cough, wheeze Neuro- denies headache, dizziness, syncope, seizure activity       Objective:    BP 136/78  Pulse 76  Temp(Src) 98.4 F (36.9 C) (Oral)  Resp 14  Ht 5\' 2"  (1.575 m)  Wt 165 lb (74.844 kg)  BMI 30.17 kg/m2 GEN- NAD, alert and oriented x3 HEENT- PERRL, EOMI, + injected left sclera, +injected left  conjunctiva,Right conjunctiva, pink, Right sclera clear MMM, oropharynx clear ,no maxillary sinus tenderness, inflammed turbinates,  Nares clear Pulses- Radial 2+          Assessment & Plan:      Problem List Items Addressed This Visit   None    Visit Diagnoses   Conjunctivitis    -  Primary    Treat with Polytrim, with work exposure, unilateral     Seasonal allergies        contiue zyrtec, add flonase        Note: This dictation was prepared with Diplomatic Services operational officer dictation along with smaller Company secretary. Any transcriptional errors that result from this process are unintentional.

## 2014-01-26 NOTE — Patient Instructions (Signed)
Start eye drops New allergy medication F/U as previous

## 2014-01-29 ENCOUNTER — Other Ambulatory Visit: Payer: Self-pay | Admitting: Family Medicine

## 2014-01-30 NOTE — Telephone Encounter (Signed)
Refill appropriate and filled per protocol. 

## 2014-02-08 ENCOUNTER — Other Ambulatory Visit: Payer: Self-pay | Admitting: Family Medicine

## 2014-02-09 NOTE — Telephone Encounter (Signed)
Refill appropriate and filled per protocol. 

## 2014-03-02 ENCOUNTER — Other Ambulatory Visit: Payer: Self-pay | Admitting: Family Medicine

## 2014-03-03 NOTE — Telephone Encounter (Signed)
Medication refilled per protocol. 

## 2014-05-05 ENCOUNTER — Other Ambulatory Visit: Payer: Self-pay | Admitting: Family Medicine

## 2014-05-05 NOTE — Telephone Encounter (Signed)
Refill appropriate and filled per protocol. 

## 2014-05-22 ENCOUNTER — Encounter (HOSPITAL_COMMUNITY): Payer: Self-pay | Admitting: Emergency Medicine

## 2014-05-22 ENCOUNTER — Emergency Department (HOSPITAL_COMMUNITY): Payer: BC Managed Care – PPO

## 2014-05-22 ENCOUNTER — Emergency Department (HOSPITAL_COMMUNITY)
Admission: EM | Admit: 2014-05-22 | Discharge: 2014-05-22 | Disposition: A | Discharge status: Home / Self Care | Payer: BC Managed Care – PPO | Attending: Emergency Medicine | Admitting: Emergency Medicine

## 2014-05-22 DIAGNOSIS — Z8673 Personal history of transient ischemic attack (TIA), and cerebral infarction without residual deficits: Secondary | ICD-10-CM | POA: Insufficient documentation

## 2014-05-22 DIAGNOSIS — Y9389 Activity, other specified: Secondary | ICD-10-CM | POA: Insufficient documentation

## 2014-05-22 DIAGNOSIS — S0990XA Unspecified injury of head, initial encounter: Secondary | ICD-10-CM | POA: Diagnosis not present

## 2014-05-22 DIAGNOSIS — Z87891 Personal history of nicotine dependence: Secondary | ICD-10-CM | POA: Insufficient documentation

## 2014-05-22 DIAGNOSIS — IMO0002 Reserved for concepts with insufficient information to code with codable children: Secondary | ICD-10-CM | POA: Insufficient documentation

## 2014-05-22 DIAGNOSIS — J45909 Unspecified asthma, uncomplicated: Secondary | ICD-10-CM | POA: Diagnosis not present

## 2014-05-22 DIAGNOSIS — Z7982 Long term (current) use of aspirin: Secondary | ICD-10-CM | POA: Diagnosis not present

## 2014-05-22 DIAGNOSIS — F3289 Other specified depressive episodes: Secondary | ICD-10-CM | POA: Diagnosis not present

## 2014-05-22 DIAGNOSIS — G8929 Other chronic pain: Secondary | ICD-10-CM | POA: Insufficient documentation

## 2014-05-22 DIAGNOSIS — Y929 Unspecified place or not applicable: Secondary | ICD-10-CM | POA: Diagnosis not present

## 2014-05-22 DIAGNOSIS — Z791 Long term (current) use of non-steroidal anti-inflammatories (NSAID): Secondary | ICD-10-CM | POA: Diagnosis not present

## 2014-05-22 DIAGNOSIS — Z8669 Personal history of other diseases of the nervous system and sense organs: Secondary | ICD-10-CM | POA: Diagnosis not present

## 2014-05-22 DIAGNOSIS — Z79899 Other long term (current) drug therapy: Secondary | ICD-10-CM | POA: Insufficient documentation

## 2014-05-22 DIAGNOSIS — I10 Essential (primary) hypertension: Secondary | ICD-10-CM | POA: Diagnosis not present

## 2014-05-22 DIAGNOSIS — F329 Major depressive disorder, single episode, unspecified: Secondary | ICD-10-CM | POA: Insufficient documentation

## 2014-05-22 MED ORDER — TRAMADOL HCL 50 MG PO TABS: 50.0000 mg | ORAL_TABLET | Freq: Four times a day (QID) | ORAL | Status: DC | PRN

## 2014-05-22 NOTE — ED Provider Notes (Signed)
CSN: [id number]     Arrival date & time 05/22/14  1243 History  This chart was scribed for Teresa Soto, * by Erling Conte, ED Scribe. This patient was seen in room APA09/APA09 and the patient's care was started at 12:56 PM.    Chief Complaint  Patient presents with  . Head Injury      The history is provided by the patient. No language interpreter was used.   HPI Comments: Teresa Soto is a 58 y.o. female with a h/o HTN, asthma, neuropathic pain, chronic back pain and CVA who presents to the Emergency Department complaining of an injury to her head that occurred 1 hour ago. She states she hit her head with the trunk of her car. She reports that after she hit her head she developed a large bruise on the back of her head. She notes she is having an associated fatigue, dizziness and nausea. After the injury she went home and iced her head with minimal relief. Pt states that she regularly takes ASA and bruises easily.  She denies any LOC, HA, emesis, numbness, weakness, neck pain or lightheadedness.     Past Medical History  Diagnosis Date  . Vertigo   . Hypertension   . Asthma   . Neuropathic pain   . Restless legs syndrome 2007 approx  . Depression   . Chronic back pain   . Stroke    Past Surgical History  Procedure Laterality Date  . Back surgery    . Tonsillectomy    . Cesarean section      x 2  . Shoulder arthroscopy w/ rotator cuff repair      to right shoulder  . Tubal ligation    . Spine surgery  12/2010    ruptd L1 L2 , Dr Carloyn Manner   No family history on file. History  Substance Use Topics  . Smoking status: Former Research scientist (life sciences)  . Smokeless tobacco: Never Used     Comment: quit in 1984  . Alcohol Use: Yes     Comment: occasionally   OB History   Grav Para Term Preterm Abortions TAB SAB Ect Mult Living                 Review of Systems  Constitutional: Positive for fatigue.  HENT: Negative for ear pain.   Gastrointestinal: Negative for nausea and  vomiting.  Neurological: Positive for dizziness. Negative for syncope, weakness, light-headedness, numbness and headaches.  Hematological: Bruises/bleeds easily.  All other systems reviewed and are negative.     Allergies  Other and Influenza vaccines  Home Medications   Prior to Admission medications   Medication Sig Start Date End Date Taking? Authorizing Provider  albuterol (PROVENTIL HFA;VENTOLIN HFA) 108 (90 BASE) MCG/ACT inhaler Inhale 2 puffs into the lungs every 6 (six) hours as needed. Shortness of Breath 10/20/13  Yes Alycia Rossetti, MD  aspirin EC 81 MG tablet Take 81 mg by mouth every evening.   Yes Historical Provider, MD  cetirizine (ZYRTEC) 10 MG tablet Take 10 mg by mouth every evening.    Yes Historical Provider, MD  escitalopram (LEXAPRO) 20 MG tablet Take 20 mg by mouth daily.   Yes Historical Provider, MD  fluticasone (FLONASE) 50 MCG/ACT nasal spray Place 2 sprays into both nostrils daily. 01/26/14  Yes Alycia Rossetti, MD  gabapentin (NEURONTIN) 300 MG capsule Take 300 mg by mouth at bedtime.   Yes Historical Provider, MD  lisinopril (PRINIVIL,ZESTRIL) 10 MG tablet Take  1 tablet (10 mg total) by mouth daily.   Yes Alycia Rossetti, MD  Melatonin 10 MG TABS Take 1 tablet by mouth at bedtime.   Yes Historical Provider, MD  montelukast (SINGULAIR) 10 MG tablet Take 1 tablet (10 mg total) by mouth at bedtime.   Yes Alycia Rossetti, MD  naproxen sodium (ALEVE) 220 MG tablet Take 440 mg by mouth 2 (two) times daily as needed (pain).    Yes Historical Provider, MD  pramipexole (MIRAPEX) 0.25 MG tablet Take 0.25 mg by mouth at bedtime.   Yes Historical Provider, MD  Vitamin D, Ergocalciferol, (DRISDOL) 50000 UNITS CAPS capsule Take 50,000 Units by mouth every 7 (seven) days. Takes on Sundays.   Yes Historical Provider, MD   Triage Vitals: BP 157/94  Pulse 97  Temp(Src) 98.2 F (36.8 C) (Oral)  Resp 20  Ht 5\' 2"  (1.575 m)  Wt 160 lb (72.576 kg)  BMI 29.26 kg/m2   SpO2 97%  Physical Exam  Constitutional: She is oriented to person, place, and time. She appears well-developed and well-nourished. No distress.  HENT:  Head: Normocephalic and atraumatic.  Right Ear: Hearing normal.  Left Ear: Hearing normal.  Nose: Nose normal.  Mouth/Throat: Oropharynx is clear and moist and mucous membranes are normal.  Eyes: Conjunctivae and EOM are normal. Pupils are equal, round, and reactive to light.  Neck: Normal range of motion. Neck supple.  Cardiovascular: Regular rhythm, S1 normal and S2 normal.  Exam reveals no gallop and no friction rub.   No murmur heard. Pulmonary/Chest: Effort normal and breath sounds normal. No respiratory distress. She exhibits no tenderness.  Abdominal: Soft. Normal appearance and bowel sounds are normal. There is no hepatosplenomegaly. There is no tenderness. There is no rebound, no guarding, no tenderness at McBurney's point and negative Murphy's sign. No hernia.  Musculoskeletal: Normal range of motion.  Neurological: She is alert and oriented to person, place, and time. She has normal strength and normal reflexes. No cranial nerve deficit or sensory deficit. Coordination normal. GCS eye subscore is 4. GCS verbal subscore is 5. GCS motor subscore is 6.  Skin: Skin is warm, dry and intact. No rash noted. No cyanosis.  left vertex scalp hematoma  Psychiatric: She has a normal mood and affect. Her speech is normal and behavior is normal. Thought content normal.    ED Course  Procedures (including critical care time)  DIAGNOSTIC STUDIES: Oxygen Saturation is 97% on RA, normal by my interpretation.    COORDINATION OF CARE: 1:02 PM- Will order head CT w/o contrast. Pt advised of plan for treatment and pt agrees.     Labs Review Labs Reviewed - No data to display  Imaging Review Ct Head Wo Contrast  05/22/2014   CLINICAL DATA:  Struck head with trunk of car 1 hr ago, hematoma, nausea, drowsy since incident  EXAM: CT HEAD  WITHOUT CONTRAST  TECHNIQUE: Contiguous axial images were obtained from the base of the skull through the vertex without intravenous contrast.  COMPARISON:  10/27/2013  FINDINGS: Normal ventricular morphology.  No midline shift or mass effect.  Normal appearance of brain parenchyma.  No intracranial hemorrhage, mass lesion or evidence acute infarction.  No extra-axial fluid collections.  LEFT parietal scalp hematoma present.  Skull intact.  IMPRESSION: No acute intracranial abnormalities.   Electronically Signed   By: Lavonia Dana M.D.   On: 05/22/2014 14:27     EKG Interpretation None      MDM   Final  diagnoses:  None   minor head injury  Patient presented to the ER for evaluation of minor head injury. This closed the trunk of her car on her head. She reports a large bump on the top of her head. She is concerned about possible intracranial injury because she takes aspirin he bruises easily. She had a normal neurologic exam. I did recommend though CT scan initially because she has very low risk for injury. Patient, however, reports that her easy bruising, she leading to find out if she had an injury. CT scan was performed and was negative.  I personally performed the services described in this documentation, which was scribed in my presence. The recorded information has been reviewed and is accurate.      Teresa Greek, MD 05/22/14 [phone]

## 2014-05-22 NOTE — Discharge Instructions (Signed)

## 2014-05-22 NOTE — ED Notes (Signed)
PT stated she hit her head with the trunk of her car about an hour ago. Hematoma noted to the back of her scalp. Pt c/o nausea and being drowsy since incident.

## 2014-05-28 ENCOUNTER — Other Ambulatory Visit: Payer: Self-pay | Admitting: Family Medicine

## 2014-06-10 ENCOUNTER — Ambulatory Visit: Payer: BC Managed Care – PPO | Admitting: Family Medicine

## 2014-06-16 ENCOUNTER — Encounter: Payer: Self-pay | Admitting: Family Medicine

## 2014-06-16 ENCOUNTER — Ambulatory Visit (INDEPENDENT_AMBULATORY_CARE_PROVIDER_SITE_OTHER): Payer: BC Managed Care – PPO | Admitting: Family Medicine

## 2014-06-16 VITALS — BP 104/60 | HR 62 | Temp 98.4°F | Resp 14 | Ht 62.0 in | Wt 164.0 lb

## 2014-06-16 DIAGNOSIS — E209 Hypoparathyroidism, unspecified: Secondary | ICD-10-CM

## 2014-06-16 DIAGNOSIS — M179 Osteoarthritis of knee, unspecified: Secondary | ICD-10-CM | POA: Insufficient documentation

## 2014-06-16 DIAGNOSIS — M1710 Unilateral primary osteoarthritis, unspecified knee: Secondary | ICD-10-CM

## 2014-06-16 DIAGNOSIS — B360 Pityriasis versicolor: Secondary | ICD-10-CM

## 2014-06-16 DIAGNOSIS — E210 Primary hyperparathyroidism: Secondary | ICD-10-CM | POA: Insufficient documentation

## 2014-06-16 DIAGNOSIS — I10 Essential (primary) hypertension: Secondary | ICD-10-CM

## 2014-06-16 DIAGNOSIS — E212 Other hyperparathyroidism: Secondary | ICD-10-CM | POA: Insufficient documentation

## 2014-06-16 DIAGNOSIS — M1711 Unilateral primary osteoarthritis, right knee: Secondary | ICD-10-CM

## 2014-06-16 MED ORDER — FLUCONAZOLE 150 MG PO TABS: ORAL_TABLET | ORAL | Status: DC

## 2014-06-16 MED ORDER — NAPROXEN 500 MG PO TABS: 500.0000 mg | ORAL_TABLET | Freq: Two times a day (BID) | ORAL | Status: DC

## 2014-06-16 NOTE — Progress Notes (Signed)
Patient ID: Teresa Soto, female   DOB: 11-30-1955, 58 y.o.   MRN: [id number]   Subjective:    Patient ID: Teresa Soto, female    DOB: 1956/01/04, 58 y.o.   MRN: [id number]  Patient presents for R knee pain  patient here with right knee pain on and off she's had for years. She will like referral to orthopedics to have this evaluated. She was told that she has arthritis she's noticed that her knee appears to be growing out towards the also feels like it's giving out on her. She did have a fall but this was about a year or so ago when she flared up her back which she is are he had treated by her neurosurgeon as well as Dr. Francesco Runner appears that she has some facet injections. She's currently taking Aleve which helps as well as gabapentin. She's not had any significant swelling.  Rash she's had a recurrent rash over the past 20+ years she was given Selsun Blue topical wash in the past which helped. Somebody continues to recur and is worse in the summertime.  Hypertension tolerating medications as prescribed    Review Of Systems:  GEN- denies fatigue, fever, weight loss,weakness, recent illness HEENT- denies eye drainage, change in vision, nasal discharge, CVS- denies chest pain, palpitations RESP- denies SOB, cough, wheeze ABD- denies N/V, change in stools, abd pain GU- denies dysuria, hematuria, dribbling, incontinence MSK- + joint pain, muscle aches, injury Neuro- denies headache, dizziness, syncope, seizure activity       Objective:    BP 104/60  Pulse 62  Temp(Src) 98.4 F (36.9 C) (Oral)  Resp 14  Ht 5\' 2"  (1.575 m)  Wt 164 lb (74.39 kg)  BMI 29.99 kg/m2 GEN- NAD, alert and oriented x3 CVS- RRR, no murmur RESP-CTAB MSK- Right knee- mild swelling below patella, fair ROM, no crepitus, standing appears to have a bowing outward, ligaments in tact, left knee- no effusion, good ROM Skin- mulitple raised circular erythematous plaque lesions across chest, neck, upper back EXT- No  edema Pulses- Radial, DP- 2+        Assessment & Plan:      Problem List Items Addressed This Visit   Tinea versicolor   OA (osteoarthritis) of knee - Primary   Relevant Medications      naproxen (NAPROSYN) tablet   Other Relevant Orders      Vitamin D, 25-hydroxy   Idiopathic parathyroidism   Relevant Orders      PTH, Intact and Calcium      Basic metabolic panel      Note: This dictation was prepared with Dragon dictation along with smaller phrase technology. Any transcriptional errors that result from this process are unintentional.

## 2014-06-16 NOTE — Assessment & Plan Note (Signed)
X-rays need to be obtained referral to orthopedics for further evaluation I will switch her to Naprosyn 500 mg twice a day

## 2014-06-16 NOTE — Patient Instructions (Addendum)
Continue current medications Naprosyn twice a day for pain with food  Get the xrays done Anti-fungal for chest and back Referral to orthopedics F/U 6 months

## 2014-06-16 NOTE — Assessment & Plan Note (Signed)
Well controlled 

## 2014-06-16 NOTE — Assessment & Plan Note (Signed)
She was unable to afford to get endocrinology therefore has not had any followup for her hyperparathyroidism in her hypercalcemia I will recheck her labs today

## 2014-06-16 NOTE — Assessment & Plan Note (Signed)
I will do a trial for, salt once a week for 4 weeks

## 2014-06-17 LAB — PTH, INTACT AND CALCIUM
CALCIUM: 11 mg/dL — AB (ref 8.4–10.5)
PTH: 188 pg/mL — ABNORMAL HIGH (ref 14–64)

## 2014-06-17 LAB — BASIC METABOLIC PANEL
BUN: 21 mg/dL (ref 6–23)
CALCIUM: 11 mg/dL — AB (ref 8.4–10.5)
CO2: 26 mEq/L (ref 19–32)
CREATININE: 1.02 mg/dL (ref 0.50–1.10)
Chloride: 109 mEq/L (ref 96–112)
Glucose, Bld: 100 mg/dL — ABNORMAL HIGH (ref 70–99)
Potassium: 4.3 mEq/L (ref 3.5–5.3)
Sodium: 138 mEq/L (ref 135–145)

## 2014-06-17 LAB — VITAMIN D 25 HYDROXY (VIT D DEFICIENCY, FRACTURES): VIT D 25 HYDROXY: 37 ng/mL (ref 30–89)

## 2014-06-20 ENCOUNTER — Other Ambulatory Visit: Payer: Self-pay | Admitting: *Deleted

## 2014-06-20 DIAGNOSIS — E213 Hyperparathyroidism, unspecified: Secondary | ICD-10-CM

## 2014-07-05 ENCOUNTER — Other Ambulatory Visit: Payer: Self-pay | Admitting: Family Medicine

## 2014-07-06 NOTE — Telephone Encounter (Signed)
Refill appropriate and filled per protocol. 

## 2014-07-08 ENCOUNTER — Ambulatory Visit (INDEPENDENT_AMBULATORY_CARE_PROVIDER_SITE_OTHER): Payer: BC Managed Care – PPO | Admitting: Orthopedic Surgery

## 2014-07-08 ENCOUNTER — Ambulatory Visit (INDEPENDENT_AMBULATORY_CARE_PROVIDER_SITE_OTHER): Payer: BC Managed Care – PPO

## 2014-07-08 VITALS — BP 112/71 | Ht 62.0 in | Wt 164.0 lb

## 2014-07-08 DIAGNOSIS — M25561 Pain in right knee: Secondary | ICD-10-CM

## 2014-07-08 DIAGNOSIS — M25569 Pain in unspecified knee: Secondary | ICD-10-CM

## 2014-07-08 NOTE — Progress Notes (Signed)
Chief Complaint  Patient presents with  . Knee Problem    Right knee pain, no known injury. ref. Raliegh Ip Coral    This patient has not been seen for more than 3 years. Referring physician Dr. Buelah Manis. Pharmacy [facility]  Right knee pain  Patient reports pain said she was in her 87s and has difficulty going down the stairs. Her pain is exacerbated when she fell in 2012 she's been basically self-medicating with naproxen 500 mg twice a day no other treatment. Pain is increased with standing and walking. Pain is diffuse. She reports catching as if there is a rough spot in the knee and intermittent giving way episodes associated with stiffness stabbing and aching pain and some swelling. Pain rated 7/10.  Review of systems fatigue with hearing loss ringing in the ears cough wheezing constipation numbness seasonal allergies allergy to peppers joint pain gait problems differential joint swollen joints back pain previous discectomy  Medical history asthma hypertension stroke blood clots arthritis  Past Surgical History  Procedure Laterality Date  . Back surgery    . Tonsillectomy    . Cesarean section      x 2  . Shoulder arthroscopy w/ rotator cuff repair      to right shoulder  . Tubal ligation    . Spine surgery  12/2010    ruptd L1 L2 , Dr Carloyn Manner   Medications lisinopril naproxen iron Singulair Lexapro aspirin but tone and vitamin   Drug allergies none  No family history on file. Self recorded family history of alcoholism heart failure depression hypertension heart disease cancer arthritis osteoporosis thyroid disease and difficulties with anesthesia  She does not smoke alcohol use is socially no street drugs are noted  My last note is from 2011 related to her shoulder  BP 112/71  Ht 5\' 2"  (1.575 m)  Wt 164 lb (74.39 kg)  BMI 29.99 kg/m2 General appearance is normal grooming hygiene. Oriented x3. Mood and affect normal. Gait and station normal. Upper extremities show no contracture  subluxation atrophy or tremor or skin lesions. Alignment normal.. Left knee full range of motion stability and strength without swelling skin normal pulse is intact temperature normal no lymphadenopathy sensation is normal normal hip range of motion reflexes are 2+ at the knee coordination and balance are normal  Right knee medial joint line tenderness patellofemoral crepitance positive Murray sign for meniscal tear no instability normal skin normal strength no joint effusion normal pulse temperature no lymph nodes normal sensation  X-rays do show medial compartment moderate amount of arthritis or joint space narrowing without secondary bone changes  Impression Osteoarthritis Torn meniscus  Recommend MRI to determine if she has a torn medial meniscus

## 2014-07-08 NOTE — Patient Instructions (Signed)
We will schedule MRI for you

## 2014-07-09 ENCOUNTER — Ambulatory Visit: Payer: BC Managed Care – PPO | Admitting: Orthopedic Surgery

## 2014-07-17 ENCOUNTER — Ambulatory Visit (HOSPITAL_COMMUNITY): Admission: RE | Admit: 2014-07-17 | Payer: BC Managed Care – PPO | Source: Ambulatory Visit

## 2014-07-21 ENCOUNTER — Other Ambulatory Visit (HOSPITAL_COMMUNITY): Payer: BC Managed Care – PPO

## 2014-07-22 ENCOUNTER — Ambulatory Visit (HOSPITAL_COMMUNITY)
Admission: RE | Admit: 2014-07-22 | Discharge: 2014-07-22 | Disposition: A | Discharge status: Home / Self Care | Payer: BC Managed Care – PPO | Source: Ambulatory Visit | Attending: Orthopedic Surgery | Admitting: Orthopedic Surgery

## 2014-07-22 DIAGNOSIS — M25461 Effusion, right knee: Secondary | ICD-10-CM | POA: Insufficient documentation

## 2014-07-22 DIAGNOSIS — M25561 Pain in right knee: Secondary | ICD-10-CM | POA: Diagnosis not present

## 2014-07-27 ENCOUNTER — Encounter: Payer: Self-pay | Admitting: Orthopedic Surgery

## 2014-07-27 ENCOUNTER — Encounter: Payer: Self-pay | Admitting: *Deleted

## 2014-07-27 ENCOUNTER — Ambulatory Visit (INDEPENDENT_AMBULATORY_CARE_PROVIDER_SITE_OTHER): Payer: BC Managed Care – PPO | Admitting: Orthopedic Surgery

## 2014-07-27 VITALS — BP 129/80 | Ht 62.0 in | Wt 164.0 lb

## 2014-07-27 DIAGNOSIS — M1711 Unilateral primary osteoarthritis, right knee: Secondary | ICD-10-CM | POA: Insufficient documentation

## 2014-07-27 NOTE — Patient Instructions (Signed)
Surgery 08/12/14 Right total knee replacement  This procedure has been fully reviewed with the patient and written informed consent has been obtained. You have decided to proceed with knee replacement surgery. You have decided not to continue with nonoperative measures such as but not limited to oral medication, weight loss, activity modification, physical therapy, bracing, or injection.  We will perform the procedure commonly known as total knee replacement. Some of the risks associated with knee replacement surgery include but are not limited to Bleeding Infection Swelling Stiffness Blood clot Pain that persists even after surgery  Infection is especially devastating complication of knee surgery although rare. If infection does occur your implant will usually have to be removed and several surgeries will be needed to eradicate the infection prior to performing a repeat replacement.   If you're not comfortable with these risks and would like to continue with nonoperative treatment please let Dr. [name] know prior to your surgery.

## 2014-07-27 NOTE — Progress Notes (Signed)
Chief Complaint  Patient presents with  . Results    review Mri results Right knee    History reviewed and review of systems no change since September 30  Right knee pain  Patient reports pain said she was in her 85s and has difficulty going down the stairs. Her pain is exacerbated when she fell in 2012 she's been basically self-medicating with naproxen 500 mg twice a day no other treatment. Pain is increased with standing and walking. Pain is diffuse. She reports catching as if there is a rough spot in the knee and intermittent giving way episodes associated with stiffness stabbing and aching pain and some swelling. Pain rated 7/10.  Review of systems fatigue with hearing loss ringing in the ears cough wheezing constipation numbness seasonal allergies allergy to peppers joint pain gait problems differential joint swollen joints back pain previous discectomy   We discussed the MRI which shows that the patient has severe arthritis of the knee a loose body. My interpretation is that she has patellofemoral and medial compartment disease severe.  I gave her the following options: Remove loose body and treat the arthritis. I reviewed with her that she has only been fully treated nonoperatively but she says she cannot stand the pain anymore and she needs to have a laminated if possible. We gave her the options of weight loss, physical therapy, cortisone injection, Synvisc injection, bracing.  She does not want to proceed with nonoperative treatment and wants to have knee replacement surgery  I reviewed those complications associated with knee replacement surgery including pulmonary embolus infection and amputation as well as others.  The visit was for counseling regarding her MRI and to discuss the risks and benefits of total knee surgery versus continued nonoperative treatment. And time spent was greater than 25 minutes but less than 40 minutes.   Right total knee arthroplasty planned. For right  total knee arthritis and patellofemoral arthritis.

## 2014-07-29 ENCOUNTER — Encounter (HOSPITAL_COMMUNITY): Payer: Self-pay | Admitting: Pharmacy Technician

## 2014-07-29 ENCOUNTER — Other Ambulatory Visit: Payer: Self-pay | Admitting: *Deleted

## 2014-08-05 ENCOUNTER — Telehealth: Payer: Self-pay | Admitting: Orthopedic Surgery

## 2014-08-05 NOTE — Telephone Encounter (Signed)
Regarding in-patient/admit surgery scheduled 08/12/14 at [facility], CPT [phone], ICD-10 codes M17.11 and M25.561, contacted [facility], ph# [phone], spoke with Care management representative, Francee Nodal.  Received pre-authorization/Reference# [id number], effective 08/12/14 through 08/25/14.  No clinicals needed at this time.

## 2014-08-05 NOTE — Patient Instructions (Addendum)
Teresa Soto  08/05/2014   Your procedure is scheduled on:  08/12/2014  Report to [facility] at 8:40 AM.  Call this number if you have problems the morning of surgery: [phone]   Remember:   Do not eat food or drink liquids after midnight.   Take these medicines the morning of surgery with A SIP OF WATER:   TAKES ALL MEDICATIONS AT NIGHT   Do not wear jewelry, make-up or nail polish.  Do not wear lotions, powders, or perfumes. You may wear deodorant.  Do not shave 48 hours prior to surgery. Men may shave face and neck.  Do not bring valuables to the hospital.  [facility] is not responsible for any belongings or valuables.               Contacts, dentures or bridgework may not be worn into surgery.  Leave suitcase in the car. After surgery it may be brought to your room.  For patients admitted to the hospital, discharge time is determined by your treatment team.               Patients discharged the day of surgery will not be allowed to drive home.  Name and phone number of your driver:   Special Instructions: Shower using CHG 2 nights before surgery and the night before surgery.  If you shower the day of surgery use CHG.  Use special wash - you have one bottle of CHG for all showers.  You should use approximately 1/3 of the bottle for each shower.   Please read over the following fact sheets that you were given: Surgical Site Infection Prevention and Anesthesia Post-op Instructions   PATIENT INSTRUCTIONS POST-ANESTHESIA  IMMEDIATELY FOLLOWING SURGERY:  Do not drive or operate machinery for the first twenty four hours after surgery.  Do not make any important decisions for twenty four hours after surgery or while taking narcotic pain medications or sedatives.  If you develop intractable nausea and vomiting or a severe headache please notify your doctor immediately.  FOLLOW-UP:  Please make an appointment with your surgeon as instructed. You do not need to follow up with  anesthesia unless specifically instructed to do so.  WOUND CARE INSTRUCTIONS (if applicable):  Keep a dry clean dressing on the anesthesia/puncture wound site if there is drainage.  Once the wound has quit draining you may leave it open to air.  Generally you should leave the bandage intact for twenty four hours unless there is drainage.  If the epidural site drains for more than 36-48 hours please call the anesthesia department.  QUESTIONS?:  Please feel free to call your physician or the hospital operator if you have any questions, and they will be happy to assist you.      Total Knee Replacement Total knee replacement is a procedure to replace your knee joint with an artificial knee joint (prosthetic knee joint). The purpose of this surgery is to reduce pain and improve your knee function. LET [facility] CARE PROVIDER KNOW ABOUT:   Any allergies you have.  All medicines you are taking, including vitamins, herbs, eye drops, creams, and over-the-counter medicines.  Previous problems you or members of your family have had with the use of anesthetics.  Any blood disorders you have.  Previous surgeries you have had.  Medical conditions you have. RISKS AND COMPLICATIONS  Generally, total knee replacement is a safe procedure. However, problems can occur and include:  Loss of range of motion of the  knee or instability.  Loosening of the prosthesis.  Infection.  Persistent pain. BEFORE THE PROCEDURE   Do not eat or drink anything after midnight on the night before the procedure or as directed by your health care provider.  Ask your health care provider about changing or stopping your regular medicines. This is especially important if you are taking diabetes medicines or blood thinners. PROCEDURE   Just before the procedure, you will receive medicine that will make you drowsy (sedative). This will be given through a tube that is inserted into one of your veins (IV tube).  Then you  will be given one of the following:  A medicine injected into your spine that numbs your body below the waist (spinal anesthetic).  A medicine that makes you fall asleep (general anesthetic).  You may also receive medicine to block feeling in your leg (nerve block) to help ease pain after surgery.  An incision will be made in your knee. Your surgeon will take out any damaged cartilage and bone by sawing off the damaged surfaces.  The surgeon will then put a new metal liner over the sawed-off portion of your thigh bone (femur) and a plastic liner over the sawed-off portion of one of the bones of your lower leg (tibia). This is to restore alignment and function to your knee. A plastic piece is often used to restore the surface of your knee cap. AFTER THE PROCEDURE   You will be taken to the recovery area.  You may have drainage tubes to drain excess fluid from your knee. These tubes attach to a device that removes these fluids.  Once you are awake, stable, and taking fluids well, you will be taken to your hospital room.  You will receive physical therapy as prescribed by your health care provider.  Your surgeon may recommend that you spend time (usually an additional 10-14 days) in an extended-care facility to help you begin walking again and improve your range of motion before you go home.  You may also be prescribed blood-thinning medicine to decrease your risk of developing blood clots in your leg. Document Released: 01/01/2001 Document Revised: 02/09/2014 Document Reviewed: 11/05/2011 [facility] Patient Information 2015 [place], [place]. This information is not intended to replace advice given to you by your health care provider. Make sure you discuss any questions you have with your health care provider.

## 2014-08-06 ENCOUNTER — Encounter (HOSPITAL_COMMUNITY): Payer: Self-pay

## 2014-08-06 ENCOUNTER — Encounter (HOSPITAL_COMMUNITY)
Admission: RE | Admit: 2014-08-06 | Discharge: 2014-08-06 | Disposition: A | Discharge status: Home / Self Care | Payer: BC Managed Care – PPO | Source: Ambulatory Visit | Attending: Orthopedic Surgery | Admitting: Orthopedic Surgery

## 2014-08-06 DIAGNOSIS — Z01812 Encounter for preprocedural laboratory examination: Secondary | ICD-10-CM | POA: Insufficient documentation

## 2014-08-06 LAB — BASIC METABOLIC PANEL
ANION GAP: 11 (ref 5–15)
BUN: 13 mg/dL (ref 6–23)
CHLORIDE: 106 meq/L (ref 96–112)
CO2: 25 mEq/L (ref 19–32)
Calcium: 11.9 mg/dL — ABNORMAL HIGH (ref 8.4–10.5)
Creatinine, Ser: 0.77 mg/dL (ref 0.50–1.10)
Glucose, Bld: 121 mg/dL — ABNORMAL HIGH (ref 70–99)
POTASSIUM: 4.2 meq/L (ref 3.7–5.3)
SODIUM: 142 meq/L (ref 137–147)

## 2014-08-06 LAB — CBC
HCT: 38.5 % (ref 36.0–46.0)
Hemoglobin: 13 g/dL (ref 12.0–15.0)
MCH: 29.1 pg (ref 26.0–34.0)
MCHC: 33.8 g/dL (ref 30.0–36.0)
MCV: 86.1 fL (ref 78.0–100.0)
PLATELETS: 311 10*3/uL (ref 150–400)
RBC: 4.47 MIL/uL (ref 3.87–5.11)
RDW: 13.5 % (ref 11.5–15.5)
WBC: 5.1 10*3/uL (ref 4.0–10.5)

## 2014-08-06 LAB — PREPARE RBC (CROSSMATCH)

## 2014-08-06 LAB — SURGICAL PCR SCREEN
MRSA, PCR: NEGATIVE
STAPHYLOCOCCUS AUREUS: NEGATIVE

## 2014-08-06 LAB — PROTIME-INR
INR: 1.02 (ref 0.00–1.49)
Prothrombin Time: 13.5 seconds (ref 11.6–15.2)

## 2014-08-06 LAB — APTT: aPTT: 28 seconds (ref 24–37)

## 2014-08-06 LAB — ABO/RH: ABO/RH(D): A POS

## 2014-08-10 NOTE — H&P (Signed)
Teresa Soto is an 58 y.o. female.   Chief Complaint: right knee pain  HPI: Right knee pain  Patient reports pain said she was in her 52s and has difficulty going down the stairs. Her pain is exacerbated when she fell in 2012 she's been basically self-medicating with naproxen 500 mg twice a day no other treatment. Pain is increased with standing and walking. Pain is diffuse. She reports catching as if there is a rough spot in the knee and intermittent giving way episodes associated with stiffness stabbing and aching pain and some swelling. Pain rated 7/10.  Review of systems fatigue with hearing loss ringing in the ears cough wheezing constipation numbness seasonal allergies allergy to peppers joint pain gait problems differential joint swollen joints back pain previous discectomy   Past Medical History  Diagnosis Date  . Vertigo   . Hypertension   . Asthma   . Neuropathic pain   . Restless legs syndrome 2007 approx  . Depression   . Chronic back pain   . Stroke   . Spinal headache     Past Surgical History  Procedure Laterality Date  . Back surgery    . Tonsillectomy    . Cesarean section      x 2  . Shoulder arthroscopy w/ rotator cuff repair      to right shoulder  . Tubal ligation    . Spine surgery  12/2010    ruptd L1 L2 , Dr Carloyn Manner    No family history on file. Social History:  reports that she quit smoking about 30 years ago. She has never used smokeless tobacco. She reports that she drinks alcohol. She reports that she does not use illicit drugs.  Allergies:  Allergies  Allergen Reactions  . Other Nausea And Vomiting    Peppers  . Influenza Vaccines Rash    No prescriptions prior to admission    No results found for this or any previous visit (from the past 48 hour(s)). No results found.  ROS  There were no vitals taken for this visit. Physical Exam  BP 112/71  Ht 5\' 2"  (1.575 m)  Wt 164 lb (74.39 kg)  BMI 29.99 kg/m2 General appearance is normal  grooming hygiene. Oriented x3. Mood and affect normal. Gait and station normal. Upper extremities show no contracture subluxation atrophy or tremor or skin lesions. Alignment normal.. Left knee full range of motion stability and strength without swelling skin normal pulse is intact temperature normal no lymphadenopathy sensation is normal normal hip range of motion reflexes are 2+ at the knee coordination and balance are normal  Right knee medial joint line tenderness patellofemoral crepitance positive Murray sign for meniscal tear no instability normal skin normal strength no joint effusion normal pulse temperature no lymph nodes normal sensation   The patient's upper extremities are within normal limits   X-rays do show medial compartment moderate amount of arthritis or joint space narrowing without secondary bone changes  Assessment/Plan  x-ray findings Osteoarthritis of the right knee  MRI findings IMPRESSION: 1. Severe medial meniscal degeneration and extrusion without a discrete tear. 2. Age advanced medial and patellofemoral moderate to severe osteoarthritis. 3. Small effusion and degenerative synovitis. Small loose body in the posterior medial compartment.  We discussed the MRI which shows that the patient has severe arthritis of the knee a loose body. My interpretation is that she has patellofemoral and medial compartment disease severe.  I gave her the following options: Remove loose body and treat  the arthritis. I reviewed with her that she has only been fully treated nonoperatively but she says she cannot stand the pain anymore and she needs to have a laminated if possible. We gave her the options of weight loss, physical therapy, cortisone injection, Synvisc injection, bracing.  She does not want to proceed with nonoperative treatment and wants to have knee replacement surgery  I reviewed those complications associated with knee replacement surgery including pulmonary  embolus infection and amputation as well as others.  The visit was for counseling regarding her MRI and to discuss the risks and benefits of total knee surgery versus continued nonoperative treatment. And time spent was greater than 25 minutes but less than 40 minutes.   Right total knee arthroplasty planned. For right total knee arthritis and patellofemoral arthritis.           Arther Abbott 08/10/2014, 3:59 PM

## 2014-08-11 ENCOUNTER — Telehealth: Payer: Self-pay | Admitting: Orthopedic Surgery

## 2014-08-11 NOTE — Telephone Encounter (Signed)
Routing to Dr Harrison 

## 2014-08-11 NOTE — Telephone Encounter (Signed)
Patient states that we can cancel the Rehab after surgery due to a family member coming to stay with her at her home

## 2014-08-12 ENCOUNTER — Encounter (HOSPITAL_COMMUNITY): Payer: Self-pay | Admitting: *Deleted

## 2014-08-12 ENCOUNTER — Inpatient Hospital Stay (HOSPITAL_COMMUNITY): Payer: BC Managed Care – PPO | Admitting: Anesthesiology

## 2014-08-12 ENCOUNTER — Inpatient Hospital Stay (HOSPITAL_COMMUNITY)
Admission: RE | Admit: 2014-08-12 | Discharge: 2014-08-16 | DRG: 470 | Disposition: A | Discharge status: Home / Self Care | Payer: BC Managed Care – PPO | Source: Ambulatory Visit | Attending: Orthopedic Surgery | Admitting: Orthopedic Surgery

## 2014-08-12 ENCOUNTER — Inpatient Hospital Stay (HOSPITAL_COMMUNITY): Payer: BC Managed Care – PPO

## 2014-08-12 ENCOUNTER — Encounter (HOSPITAL_COMMUNITY)
Admission: RE | Disposition: A | Discharge status: Home / Self Care | Payer: Self-pay | Source: Ambulatory Visit | Attending: Orthopedic Surgery

## 2014-08-12 DIAGNOSIS — I10 Essential (primary) hypertension: Secondary | ICD-10-CM | POA: Diagnosis present

## 2014-08-12 DIAGNOSIS — R509 Fever, unspecified: Secondary | ICD-10-CM

## 2014-08-12 DIAGNOSIS — K5900 Constipation, unspecified: Secondary | ICD-10-CM | POA: Diagnosis present

## 2014-08-12 DIAGNOSIS — H9190 Unspecified hearing loss, unspecified ear: Secondary | ICD-10-CM | POA: Diagnosis present

## 2014-08-12 DIAGNOSIS — J9811 Atelectasis: Secondary | ICD-10-CM | POA: Diagnosis not present

## 2014-08-12 DIAGNOSIS — G2581 Restless legs syndrome: Secondary | ICD-10-CM | POA: Diagnosis present

## 2014-08-12 DIAGNOSIS — Z96659 Presence of unspecified artificial knee joint: Secondary | ICD-10-CM

## 2014-08-12 DIAGNOSIS — J811 Chronic pulmonary edema: Secondary | ICD-10-CM | POA: Diagnosis not present

## 2014-08-12 DIAGNOSIS — J45909 Unspecified asthma, uncomplicated: Secondary | ICD-10-CM | POA: Diagnosis present

## 2014-08-12 DIAGNOSIS — Z96651 Presence of right artificial knee joint: Secondary | ICD-10-CM

## 2014-08-12 DIAGNOSIS — M1711 Unilateral primary osteoarthritis, right knee: Principal | ICD-10-CM | POA: Diagnosis present

## 2014-08-12 DIAGNOSIS — Z87891 Personal history of nicotine dependence: Secondary | ICD-10-CM | POA: Diagnosis not present

## 2014-08-12 DIAGNOSIS — J810 Acute pulmonary edema: Secondary | ICD-10-CM | POA: Diagnosis not present

## 2014-08-12 DIAGNOSIS — Z8673 Personal history of transient ischemic attack (TIA), and cerebral infarction without residual deficits: Secondary | ICD-10-CM | POA: Diagnosis not present

## 2014-08-12 HISTORY — PX: TOTAL KNEE ARTHROPLASTY: SHX125

## 2014-08-12 HISTORY — DX: Unilateral primary osteoarthritis, right knee: M17.11

## 2014-08-12 SURGERY — ARTHROPLASTY, KNEE, TOTAL
Anesthesia: General | Site: Knee | Laterality: Right

## 2014-08-12 MED ORDER — SUFENTANIL CITRATE 50 MCG/ML IV SOLN
INTRAVENOUS | Status: DC | PRN
  Administered 2014-08-12 (×8): 10 ug via INTRAVENOUS

## 2014-08-12 MED ORDER — MAGNESIUM CITRATE PO SOLN: 1.0000 | Freq: Once | ORAL | Status: AC | PRN

## 2014-08-12 MED ORDER — DIPHENHYDRAMINE HCL 12.5 MG/5ML PO ELIX: 12.5000 mg | ORAL_SOLUTION | ORAL | Status: DC | PRN

## 2014-08-12 MED ORDER — SODIUM CHLORIDE 0.9 % IR SOLN
Status: DC | PRN
  Administered 2014-08-12: 1000 mL
  Administered 2014-08-12: 3000 mL

## 2014-08-12 MED ORDER — SUCCINYLCHOLINE CHLORIDE 20 MG/ML IJ SOLN
INTRAMUSCULAR | Status: AC
  Filled 2014-08-12: qty 1

## 2014-08-12 MED ORDER — BUPIVACAINE-EPINEPHRINE (PF) 0.5% -1:200000 IJ SOLN
INTRAMUSCULAR | Status: AC
  Filled 2014-08-12: qty 30

## 2014-08-12 MED ORDER — FLUTICASONE PROPIONATE 50 MCG/ACT NA SUSP
1.0000 | Freq: Every day | NASAL | Status: DC
  Administered 2014-08-12 – 2014-08-15 (×4): 1 via NASAL
  Filled 2014-08-12: qty 16

## 2014-08-12 MED ORDER — METOCLOPRAMIDE HCL 5 MG/ML IJ SOLN: 5.0000 mg | Freq: Three times a day (TID) | INTRAMUSCULAR | Status: DC | PRN

## 2014-08-12 MED ORDER — SODIUM CHLORIDE 0.9 % IJ SOLN
INTRAMUSCULAR | Status: AC
  Filled 2014-08-12: qty 10

## 2014-08-12 MED ORDER — PROPOFOL 10 MG/ML IV BOLUS
INTRAVENOUS | Status: DC | PRN
Start: 2014-08-12 — End: 2014-08-12
  Administered 2014-08-12: 150 mg via INTRAVENOUS

## 2014-08-12 MED ORDER — OXYCODONE-ACETAMINOPHEN 5-325 MG PO TABS
1.0000 | ORAL_TABLET | ORAL | Status: DC
  Administered 2014-08-13 – 2014-08-16 (×9): 1 via ORAL
  Filled 2014-08-12 (×11): qty 1

## 2014-08-12 MED ORDER — LIDOCAINE HCL (CARDIAC) 10 MG/ML IV SOLN
INTRAVENOUS | Status: DC | PRN
  Administered 2014-08-12: 50 mg via INTRAVENOUS

## 2014-08-12 MED ORDER — BUPIVACAINE LIPOSOME 1.3 % IJ SUSP
INTRAMUSCULAR | Status: AC
  Filled 2014-08-12: qty 20

## 2014-08-12 MED ORDER — LACTATED RINGERS IV SOLN
INTRAVENOUS | Status: DC | PRN
  Administered 2014-08-12 (×2): via INTRAVENOUS

## 2014-08-12 MED ORDER — PHENYLEPHRINE HCL 10 MG/ML IJ SOLN
INTRAMUSCULAR | Status: DC | PRN
  Administered 2014-08-12 (×2): 100 ug via INTRAVENOUS

## 2014-08-12 MED ORDER — HYDROMORPHONE HCL 1 MG/ML IJ SOLN: 0.5000 mg | INTRAMUSCULAR | Status: DC | PRN

## 2014-08-12 MED ORDER — ONDANSETRON HCL 4 MG/2ML IJ SOLN: 4.0000 mg | Freq: Once | INTRAMUSCULAR | Status: DC | PRN

## 2014-08-12 MED ORDER — PHENYLEPHRINE HCL 10 MG/ML IJ SOLN
INTRAMUSCULAR | Status: AC
  Filled 2014-08-12: qty 1

## 2014-08-12 MED ORDER — ASPIRIN EC 325 MG PO TBEC
325.0000 mg | DELAYED_RELEASE_TABLET | Freq: Two times a day (BID) | ORAL | Status: DC
  Administered 2014-08-12 – 2014-08-16 (×9): 325 mg via ORAL
  Filled 2014-08-12 (×9): qty 1

## 2014-08-12 MED ORDER — SUFENTANIL CITRATE 50 MCG/ML IV SOLN
INTRAVENOUS | Status: AC
  Filled 2014-08-12: qty 1

## 2014-08-12 MED ORDER — OXYCODONE HCL 5 MG PO TABS
5.0000 mg | ORAL_TABLET | ORAL | Status: DC
  Administered 2014-08-12 – 2014-08-16 (×9): 5 mg via ORAL
  Filled 2014-08-12 (×11): qty 1

## 2014-08-12 MED ORDER — MONTELUKAST SODIUM 10 MG PO TABS
10.0000 mg | ORAL_TABLET | Freq: Every day | ORAL | Status: DC
  Administered 2014-08-12 – 2014-08-15 (×4): 10 mg via ORAL
  Filled 2014-08-12 (×4): qty 1

## 2014-08-12 MED ORDER — MIDAZOLAM HCL 2 MG/2ML IJ SOLN
1.0000 mg | INTRAMUSCULAR | Status: DC | PRN
  Administered 2014-08-12: 2 mg via INTRAVENOUS
  Administered 2014-08-12: 1 mg via INTRAVENOUS
  Filled 2014-08-12: qty 2

## 2014-08-12 MED ORDER — BUPIVACAINE LIPOSOME 1.3 % IJ SUSP
20.0000 mL | Freq: Once | INTRAMUSCULAR | Status: DC
  Filled 2014-08-12: qty 20

## 2014-08-12 MED ORDER — ONDANSETRON HCL 4 MG/2ML IJ SOLN
INTRAMUSCULAR | Status: AC
  Filled 2014-08-12: qty 2

## 2014-08-12 MED ORDER — METOCLOPRAMIDE HCL 10 MG PO TABS: 5.0000 mg | ORAL_TABLET | Freq: Three times a day (TID) | ORAL | Status: DC | PRN

## 2014-08-12 MED ORDER — LISINOPRIL 10 MG PO TABS
10.0000 mg | ORAL_TABLET | Freq: Every day | ORAL | Status: DC
  Administered 2014-08-12: 10 mg via ORAL
  Filled 2014-08-12: qty 1

## 2014-08-12 MED ORDER — PREGABALIN 50 MG PO CAPS
ORAL_CAPSULE | ORAL | Status: AC
  Filled 2014-08-12: qty 1

## 2014-08-12 MED ORDER — ESCITALOPRAM OXALATE 10 MG PO TABS
20.0000 mg | ORAL_TABLET | Freq: Every day | ORAL | Status: DC
  Administered 2014-08-12 – 2014-08-15 (×4): 20 mg via ORAL
  Filled 2014-08-12 (×4): qty 2

## 2014-08-12 MED ORDER — DEXAMETHASONE SODIUM PHOSPHATE 4 MG/ML IJ SOLN
4.0000 mg | Freq: Once | INTRAMUSCULAR | Status: AC
Start: 2014-08-12 — End: 2014-08-12
  Administered 2014-08-12: 4 mg via INTRAVENOUS

## 2014-08-12 MED ORDER — DOCUSATE SODIUM 100 MG PO CAPS
100.0000 mg | ORAL_CAPSULE | Freq: Two times a day (BID) | ORAL | Status: DC
  Administered 2014-08-12 – 2014-08-16 (×9): 100 mg via ORAL
  Filled 2014-08-12 (×9): qty 1

## 2014-08-12 MED ORDER — ARTIFICIAL TEARS OP OINT
TOPICAL_OINTMENT | OPHTHALMIC | Status: AC
  Filled 2014-08-12: qty 3.5

## 2014-08-12 MED ORDER — SODIUM CHLORIDE 0.9 % IJ SOLN
INTRAMUSCULAR | Status: AC
  Filled 2014-08-12: qty 40

## 2014-08-12 MED ORDER — ROCURONIUM BROMIDE 100 MG/10ML IV SOLN
INTRAVENOUS | Status: DC | PRN
  Administered 2014-08-12: 30 mg via INTRAVENOUS
  Administered 2014-08-12: 10 mg via INTRAVENOUS

## 2014-08-12 MED ORDER — CEFAZOLIN SODIUM 1-5 GM-% IV SOLN
1.0000 g | Freq: Four times a day (QID) | INTRAVENOUS | Status: AC
  Administered 2014-08-12 – 2014-08-13 (×2): 1 g via INTRAVENOUS
  Filled 2014-08-12 (×2): qty 50

## 2014-08-12 MED ORDER — FENTANYL CITRATE 0.05 MG/ML IJ SOLN
INTRAMUSCULAR | Status: AC
  Filled 2014-08-12: qty 2

## 2014-08-12 MED ORDER — ALBUTEROL SULFATE (2.5 MG/3ML) 0.083% IN NEBU: 2.5000 mg | INHALATION_SOLUTION | Freq: Four times a day (QID) | RESPIRATORY_TRACT | Status: DC | PRN

## 2014-08-12 MED ORDER — FENTANYL CITRATE 0.05 MG/ML IJ SOLN
25.0000 ug | INTRAMUSCULAR | Status: DC | PRN
  Administered 2014-08-12: 50 ug via INTRAVENOUS
  Filled 2014-08-12: qty 2

## 2014-08-12 MED ORDER — OXYCODONE HCL 5 MG PO TABS
5.0000 mg | ORAL_TABLET | ORAL | Status: DC | PRN
  Administered 2014-08-12 – 2014-08-14 (×5): 10 mg via ORAL
  Administered 2014-08-16: 5 mg via ORAL
  Filled 2014-08-12 (×2): qty 2
  Filled 2014-08-12: qty 1
  Filled 2014-08-12 (×4): qty 2

## 2014-08-12 MED ORDER — METHOCARBAMOL 1000 MG/10ML IJ SOLN
500.0000 mg | Freq: Four times a day (QID) | INTRAVENOUS | Status: DC | PRN
  Filled 2014-08-12: qty 5

## 2014-08-12 MED ORDER — GLYCOPYRROLATE 0.2 MG/ML IJ SOLN
INTRAMUSCULAR | Status: AC
  Filled 2014-08-12: qty 3

## 2014-08-12 MED ORDER — MELATONIN 10 MG PO TABS: 1.0000 | ORAL_TABLET | Freq: Every day | ORAL | Status: DC

## 2014-08-12 MED ORDER — PREGABALIN 50 MG PO CAPS
50.0000 mg | ORAL_CAPSULE | Freq: Three times a day (TID) | ORAL | Status: DC
  Administered 2014-08-12 – 2014-08-16 (×12): 50 mg via ORAL
  Filled 2014-08-12 (×13): qty 1

## 2014-08-12 MED ORDER — SODIUM CHLORIDE 0.9 % IJ SOLN
INTRAMUSCULAR | Status: AC
  Filled 2014-08-12: qty 30

## 2014-08-12 MED ORDER — ACETAMINOPHEN 10 MG/ML IV SOLN
1000.0000 mg | Freq: Four times a day (QID) | INTRAVENOUS | Status: AC
  Administered 2014-08-12 – 2014-08-13 (×4): 1000 mg via INTRAVENOUS
  Filled 2014-08-12 (×3): qty 100

## 2014-08-12 MED ORDER — PHENOL 1.4 % MT LIQD: 1.0000 | OROMUCOSAL | Status: DC | PRN

## 2014-08-12 MED ORDER — EPHEDRINE SULFATE 50 MG/ML IJ SOLN
INTRAMUSCULAR | Status: DC | PRN
  Administered 2014-08-12 (×3): 5 mg via INTRAVENOUS

## 2014-08-12 MED ORDER — METHOCARBAMOL 500 MG PO TABS
500.0000 mg | ORAL_TABLET | Freq: Four times a day (QID) | ORAL | Status: DC | PRN
  Administered 2014-08-12: 500 mg via ORAL
  Filled 2014-08-12: qty 1

## 2014-08-12 MED ORDER — BUPIVACAINE-EPINEPHRINE (PF) 0.5% -1:200000 IJ SOLN
INTRAMUSCULAR | Status: DC | PRN
  Administered 2014-08-12: 30 mL

## 2014-08-12 MED ORDER — DEXAMETHASONE SODIUM PHOSPHATE 4 MG/ML IJ SOLN
INTRAMUSCULAR | Status: AC
  Filled 2014-08-12: qty 1

## 2014-08-12 MED ORDER — PRAMIPEXOLE DIHYDROCHLORIDE 0.25 MG PO TABS
0.2500 mg | ORAL_TABLET | Freq: Every day | ORAL | Status: DC
  Administered 2014-08-12 – 2014-08-15 (×4): 0.25 mg via ORAL
  Filled 2014-08-12 (×5): qty 1

## 2014-08-12 MED ORDER — ONDANSETRON HCL 4 MG/2ML IJ SOLN
4.0000 mg | Freq: Four times a day (QID) | INTRAMUSCULAR | Status: DC
  Administered 2014-08-12 – 2014-08-16 (×10): 4 mg via INTRAVENOUS
  Filled 2014-08-12 (×8): qty 2

## 2014-08-12 MED ORDER — ACETAMINOPHEN 325 MG PO TABS
650.0000 mg | ORAL_TABLET | Freq: Four times a day (QID) | ORAL | Status: DC | PRN
  Administered 2014-08-15: 650 mg via ORAL
  Filled 2014-08-12: qty 2

## 2014-08-12 MED ORDER — KETOROLAC TROMETHAMINE 30 MG/ML IJ SOLN
30.0000 mg | Freq: Four times a day (QID) | INTRAMUSCULAR | Status: AC
  Administered 2014-08-13 – 2014-08-14 (×7): 30 mg via INTRAVENOUS
  Filled 2014-08-12 (×7): qty 1

## 2014-08-12 MED ORDER — OXYCODONE HCL 5 MG PO TABS
ORAL_TABLET | ORAL | Status: AC
  Filled 2014-08-12: qty 1

## 2014-08-12 MED ORDER — SENNA 8.6 MG PO TABS
1.0000 | ORAL_TABLET | Freq: Two times a day (BID) | ORAL | Status: DC
  Administered 2014-08-12 – 2014-08-16 (×9): 8.6 mg via ORAL
  Filled 2014-08-12 (×9): qty 1

## 2014-08-12 MED ORDER — NEOSTIGMINE METHYLSULFATE 10 MG/10ML IV SOLN
INTRAVENOUS | Status: DC | PRN
  Administered 2014-08-12: 4 mg via INTRAVENOUS

## 2014-08-12 MED ORDER — METHOCARBAMOL 1000 MG/10ML IJ SOLN
500.0000 mg | Freq: Four times a day (QID) | INTRAVENOUS | Status: DC
  Administered 2014-08-12 – 2014-08-16 (×11): 500 mg via INTRAVENOUS
  Filled 2014-08-12 (×21): qty 5

## 2014-08-12 MED ORDER — ROCURONIUM BROMIDE 50 MG/5ML IV SOLN
INTRAVENOUS | Status: AC
  Filled 2014-08-12: qty 1

## 2014-08-12 MED ORDER — ACETAMINOPHEN 650 MG RE SUPP: 650.0000 mg | Freq: Four times a day (QID) | RECTAL | Status: DC | PRN

## 2014-08-12 MED ORDER — POLYETHYLENE GLYCOL 3350 17 G PO PACK
17.0000 g | PACK | Freq: Every day | ORAL | Status: DC
  Administered 2014-08-12 – 2014-08-16 (×5): 17 g via ORAL
  Filled 2014-08-12 (×5): qty 1

## 2014-08-12 MED ORDER — ONDANSETRON HCL 4 MG/2ML IJ SOLN
4.0000 mg | Freq: Once | INTRAMUSCULAR | Status: AC
  Administered 2014-08-12: 4 mg via INTRAVENOUS

## 2014-08-12 MED ORDER — MIDAZOLAM HCL 2 MG/2ML IJ SOLN
INTRAMUSCULAR | Status: AC
  Filled 2014-08-12: qty 2

## 2014-08-12 MED ORDER — CEFAZOLIN SODIUM-DEXTROSE 2-3 GM-% IV SOLR
INTRAVENOUS | Status: AC
  Filled 2014-08-12: qty 50

## 2014-08-12 MED ORDER — EPHEDRINE SULFATE 50 MG/ML IJ SOLN
INTRAMUSCULAR | Status: AC
  Filled 2014-08-12: qty 1

## 2014-08-12 MED ORDER — SODIUM CHLORIDE 0.9 % IV SOLN
INTRAVENOUS | Status: DC | PRN
  Administered 2014-08-12: 60 mL

## 2014-08-12 MED ORDER — LACTATED RINGERS IV SOLN
INTRAVENOUS | Status: DC
  Administered 2014-08-12: 10:00:00 via INTRAVENOUS

## 2014-08-12 MED ORDER — LIDOCAINE HCL (PF) 1 % IJ SOLN
INTRAMUSCULAR | Status: AC
  Filled 2014-08-12: qty 5

## 2014-08-12 MED ORDER — ALUM & MAG HYDROXIDE-SIMETH 200-200-20 MG/5ML PO SUSP: 30.0000 mL | ORAL | Status: DC | PRN

## 2014-08-12 MED ORDER — MENTHOL 3 MG MT LOZG: 1.0000 | LOZENGE | OROMUCOSAL | Status: DC | PRN

## 2014-08-12 MED ORDER — BISACODYL 10 MG RE SUPP: 10.0000 mg | Freq: Every day | RECTAL | Status: DC | PRN

## 2014-08-12 MED ORDER — SODIUM CHLORIDE 0.9 % IV SOLN
INTRAVENOUS | Status: DC
  Administered 2014-08-12: 16:00:00 via INTRAVENOUS

## 2014-08-12 MED ORDER — CHLORHEXIDINE GLUCONATE 4 % EX LIQD: 60.0000 mL | Freq: Once | CUTANEOUS | Status: DC

## 2014-08-12 MED ORDER — FENTANYL CITRATE 0.05 MG/ML IJ SOLN
25.0000 ug | INTRAMUSCULAR | Status: AC
  Administered 2014-08-12 (×2): 25 ug via INTRAVENOUS

## 2014-08-12 MED ORDER — CEFAZOLIN SODIUM-DEXTROSE 2-3 GM-% IV SOLR
2.0000 g | INTRAVENOUS | Status: AC
  Administered 2014-08-12: 2 g via INTRAVENOUS

## 2014-08-12 MED ORDER — PROPOFOL 10 MG/ML IV EMUL
INTRAVENOUS | Status: AC
  Filled 2014-08-12: qty 20

## 2014-08-12 MED ORDER — ACETAMINOPHEN 10 MG/ML IV SOLN
INTRAVENOUS | Status: AC
  Filled 2014-08-12: qty 100

## 2014-08-12 MED ORDER — ONDANSETRON HCL 4 MG/2ML IJ SOLN
4.0000 mg | Freq: Four times a day (QID) | INTRAMUSCULAR | Status: DC | PRN
  Administered 2014-08-13: 4 mg via INTRAVENOUS
  Filled 2014-08-12 (×3): qty 2

## 2014-08-12 MED ORDER — NEOSTIGMINE METHYLSULFATE 10 MG/10ML IV SOLN
INTRAVENOUS | Status: AC
  Filled 2014-08-12: qty 1

## 2014-08-12 MED ORDER — GLYCOPYRROLATE 0.2 MG/ML IJ SOLN
INTRAMUSCULAR | Status: DC | PRN
  Administered 2014-08-12: 0.6 mg via INTRAVENOUS

## 2014-08-12 MED ORDER — ONDANSETRON HCL 4 MG PO TABS: 4.0000 mg | ORAL_TABLET | Freq: Four times a day (QID) | ORAL | Status: DC | PRN

## 2014-08-12 SURGICAL SUPPLY — 74 items
BAG HAMPER (MISCELLANEOUS) ×3 IMPLANT
BANDAGE ESMARK 6X9 LF (GAUZE/BANDAGES/DRESSINGS) ×1 IMPLANT
BIT DRILL 3.2X128 (BIT) ×2 IMPLANT
BIT DRILL 3.2X128MM (BIT) ×1
BLADE HEX COATED 2.75 (ELECTRODE) ×3 IMPLANT
BLADE SAG 18X100X1.27 (BLADE) IMPLANT
BLADE SAGITTAL 25.0X1.27X90 (BLADE) IMPLANT
BLADE SAGITTAL 25.0X1.27X90MM (BLADE)
BLADE SAW SAG 90X13X1.27 (BLADE) IMPLANT
BNDG ESMARK 6X9 LF (GAUZE/BANDAGES/DRESSINGS) ×3
BOWL SMART MIX CTS (DISPOSABLE) IMPLANT
CAPT FB KNEE W/COCR XLINK ×3 IMPLANT
CEMENT HV SMART SET (Cement) ×6 IMPLANT
CLOTH BEACON ORANGE TIMEOUT ST (SAFETY) ×3 IMPLANT
COVER LIGHT HANDLE STERIS (MISCELLANEOUS) ×12 IMPLANT
COVER PROBE W GEL 5X96 (DRAPES) ×3 IMPLANT
CUFF TOURNIQUET SINGLE 34IN LL (TOURNIQUET CUFF) ×3 IMPLANT
CUFF TOURNIQUET SINGLE 44IN (TOURNIQUET CUFF) IMPLANT
DECANTER SPIKE VIAL GLASS SM (MISCELLANEOUS) ×3 IMPLANT
DRAPE BACK TABLE (DRAPES) ×3 IMPLANT
DRAPE EXTREMITY T 121X128X90 (DRAPE) ×3 IMPLANT
DRSG MEPILEX BORDER 4X12 (GAUZE/BANDAGES/DRESSINGS) ×3 IMPLANT
DURAPREP 26ML APPLICATOR (WOUND CARE) ×6 IMPLANT
ELECT REM PT RETURN 9FT ADLT (ELECTROSURGICAL) ×3
ELECTRODE REM PT RTRN 9FT ADLT (ELECTROSURGICAL) ×1 IMPLANT
EVACUATOR 3/16  PVC DRAIN (DRAIN) ×2
EVACUATOR 3/16 PVC DRAIN (DRAIN) ×1 IMPLANT
FACESHIELD LNG OPTICON STERILE (SAFETY) ×3 IMPLANT
GAUZE XEROFORM 5X9 LF (GAUZE/BANDAGES/DRESSINGS) ×3 IMPLANT
GLOVE BIO SURGEON STRL SZ 6.5 (GLOVE) ×4 IMPLANT
GLOVE BIO SURGEON STRL SZ7 (GLOVE) ×3 IMPLANT
GLOVE BIO SURGEONS STRL SZ 6.5 (GLOVE) ×2
GLOVE BIOGEL PI IND STRL 7.0 (GLOVE) ×3 IMPLANT
GLOVE BIOGEL PI IND STRL 7.5 (GLOVE) ×2 IMPLANT
GLOVE BIOGEL PI INDICATOR 7.0 (GLOVE) ×6
GLOVE BIOGEL PI INDICATOR 7.5 (GLOVE) ×4
GLOVE OPTIFIT SS 8.0 STRL (GLOVE) ×3 IMPLANT
GLOVE SKINSENSE NS SZ8.0 LF (GLOVE) ×4
GLOVE SKINSENSE STRL SZ8.0 LF (GLOVE) ×2 IMPLANT
GLOVE SS N UNI LF 8.5 STRL (GLOVE) ×3 IMPLANT
GOWN STRL REUS W/TWL LRG LVL3 (GOWN DISPOSABLE) ×12 IMPLANT
GOWN STRL REUS W/TWL XL LVL3 (GOWN DISPOSABLE) ×3 IMPLANT
HANDPIECE INTERPULSE COAX TIP (DISPOSABLE) ×2
HOOD W/PEELAWAY (MISCELLANEOUS) ×12 IMPLANT
INST SET MAJOR BONE (KITS) ×3 IMPLANT
IV NS IRRIG 3000ML ARTHROMATIC (IV SOLUTION) ×3 IMPLANT
KIT BLADEGUARD II DBL (SET/KITS/TRAYS/PACK) ×3 IMPLANT
KIT ROOM TURNOVER APOR (KITS) ×3 IMPLANT
MANIFOLD NEPTUNE II (INSTRUMENTS) ×3 IMPLANT
MARKER SKIN DUAL TIP RULER LAB (MISCELLANEOUS) ×3 IMPLANT
NEEDLE HYPO 21X1.5 SAFETY (NEEDLE) ×3 IMPLANT
NEEDLE HYPO 25X1 1.5 SAFETY (NEEDLE) ×3 IMPLANT
NS IRRIG 1000ML POUR BTL (IV SOLUTION) ×3 IMPLANT
PACK TOTAL JOINT (CUSTOM PROCEDURE TRAY) ×3 IMPLANT
PAD ARMBOARD 7.5X6 YLW CONV (MISCELLANEOUS) ×3 IMPLANT
PAD DANNIFLEX CPM (ORTHOPEDIC SUPPLIES) ×3 IMPLANT
PIN TROCAR 3 INCH (PIN) ×3 IMPLANT
SAW OSC TIP CART 19.5X105X1.3 (SAW) ×3 IMPLANT
SET BASIN LINEN APH (SET/KITS/TRAYS/PACK) ×3 IMPLANT
SET HNDPC FAN SPRY TIP SCT (DISPOSABLE) ×1 IMPLANT
SPONGE DRAIN TRACH 4X4 STRL 2S (GAUZE/BANDAGES/DRESSINGS) ×3 IMPLANT
STAPLER VISISTAT 35W (STAPLE) ×3 IMPLANT
SUT BRALON NAB BRD #1 30IN (SUTURE) ×6 IMPLANT
SUT MON AB 0 CT1 (SUTURE) ×6 IMPLANT
SUT MON AB 2-0 CT1 36 (SUTURE) IMPLANT
SYR 20CC LL (SYRINGE) ×3 IMPLANT
SYR 30ML LL (SYRINGE) ×3 IMPLANT
SYR BULB IRRIGATION 50ML (SYRINGE) ×3 IMPLANT
TAPE CLOTH SURG 4X10 WHT LF (GAUZE/BANDAGES/DRESSINGS) ×3 IMPLANT
TOWEL OR 17X26 4PK STRL BLUE (TOWEL DISPOSABLE) ×3 IMPLANT
TOWER CARTRIDGE SMART MIX (DISPOSABLE) ×3 IMPLANT
TRAY FOLEY CATH 16FR SILVER (SET/KITS/TRAYS/PACK) ×3 IMPLANT
WATER STERILE IRR 1000ML POUR (IV SOLUTION) ×6 IMPLANT
YANKAUER SUCT 12FT TUBE ARGYLE (SUCTIONS) ×3 IMPLANT

## 2014-08-12 NOTE — Op Note (Signed)
08/12/2014  12:52 PM  PATIENT:  Teresa Soto  58 y.o. female  PRE-OPERATIVE DIAGNOSIS:  right knee osteoarthritis  POST-OPERATIVE DIAGNOSIS:  right knee osteoarthritis  PROCEDURE:  Procedure(s): RIGHT TOTAL KNEE ARTHROPLASTY (Right)   Depuy 51f, 2t, 10PS poly,  32P  SURGEON:  Surgeon(s) and Role:    * Carole Civil, MD - Primary  PHYSICIAN ASSISTANT:   ASSISTANTS: betty ashley and holly proztek   ANESTHESIA:   general  EBL:  Total I/O In: 1700 [I.V.:1700] Out: 175 [Urine:150; Blood:25]  BLOOD ADMINISTERED:none  DRAINS: hemovac  LOCAL MEDICATIONS USED:  MARCAINE   , Amount: 30 ml and OTHER exparel 20/60  SPECIMEN:  No Specimen  DISPOSITION OF SPECIMEN:  N/A  COUNTS:  YES  TOURNIQUET:   Total Tourniquet Time Documented: Thigh (Right) - 79 minutes Total: Thigh (Right) - 79 minutes   DICTATION: .Viviann Spare Dictation  PLAN OF CARE: Admit to inpatient   PATIENT DISPOSITION:  PACU - hemodynamically stable.   Delay start of Pharmacological VTE agent (>24hrs) due to surgical blood loss or risk of bleeding: yes  Operative report for right total knee arthroplasty  The patient was identified in the preoperative holding area using 2 methods of identification. The operative extremity was evaluated and found to be acceptable for surgery. The chart was reviewed. The surgical site was confirmed and marked.  The patient was taken to the operating room for general anesthesia. Antibiotic per SCIP protocol was used. (ANCEF 2GM). A Foley catheter was inserted. The RIGHT lower extremity was then prepped and draped for surgery using standadr sterile technique.   The surgical team was gowned in [employer]"  The timeout was executed and the surgical site was RIGHT Knee confirmed. Implants were present. X-rays were reviewed and available.  The RIGHT lower extremity was exsanguinated with a six-inch Esmarch and the tourniquet was inflated to 300 mm of mercury. A standard  midline incision was made. The incision was carried down to the extensor mechanism. A medial arthrotomy was performed and the patella was everted. The patellofemoral ligament was released.  The medial soft tissue sleeve was elevated to the mid coronal plane. The anterior cruciate ligament and PCL were resected along with the medial and lateral meniscus.  A 3/8  inch drill bit was used to open up the femoral canal which was decompressed with suction and  irrigation until the irrigation was clear. The distal resection was set for 11 mm 5 RIGHT  The distal resection guide was pinned in place and an oscillating saw was used to remove the distal 11 mm of bone.  The femur sizing guide measured the femur to a size BETWEEN 2/2.5 . A size 2  4-in-1 cutting block was pinned to the distal femur; with ligament retractors in place the 4 femoral cuts were made.  The external alignment guide was secured to the tibia. The stylus was set for 10 mm resection from the higher side of the tibia. Appropriate slope and rotation alignment was confirmed. Landmarks include the medial mid tibial tubercle junction, malleolI., the second toe and tibial shaft. The proximal tibia was then resected. The tibia measured a size 2  FURTHER SOFT TISSUE DISSECTION WAS CARRIED OUT MEDIALLY TO OPEN UP THE MEDIAL SIDE OF THE JOINT   Spacer blocks were placed and the knee balanced with a 10 mm spacer block.  The appropriate-sized femoral notch cutting guide was secured to the distal femur and the notch cut was completed.  Trial components were placed  and range of motion was checked along with stability. The prosthesis was stable with the following components  Femur 2 Tibia 2 Spacer 10   With the trial components in place any residual bone or osteophytes from the posterior condyles was removed with a curved osteotome.  The patella was skeletonized, measured for thickness, IT WAS 21MM and then the patella was resected using the  cutting guide set at 14. The patella was then remeasured and had a final thickness of 12. It was then sized to a size 32. The holes were then drilled in the patella.  The tibial tray was then attached to the proximal tibia and prepared with reaming and punching.  ExParel 20 cc was injected into the knee with specific attention paid to the posterior capsule  The knee was then irrigated while the cement was prepared. The prosthesis was then cemented into place  Excess cement was removed. The cement was allowed to cure under pressurization  Repeat irrigation and any residual cement fragments were removed  The knee was again checked for range of motion, patellar tracking and stability with the trial spacer in place. A polyethylene insert was then placed using a size 10  Irrigation was performed again. A drain was placed. 40 cc of dilute EXPAREL was then injected.  Closure was performed with #1 Bralon for the extensor mechanism. 30 cc of Marcaine with epinephrine was injected into the joint followed by 0 Monocryl for the subcutaneous tissue.  Skin was reapproximated with skin staples.   A sterile bandage was applied along with a TED hose.  The patient taken recovery in stable condition

## 2014-08-12 NOTE — Brief Op Note (Signed)
08/12/2014  12:52 PM  PATIENT:  Teresa Soto  58 y.o. female  PRE-OPERATIVE DIAGNOSIS:  right knee osteoarthritis  POST-OPERATIVE DIAGNOSIS:  right knee osteoarthritis  PROCEDURE:  Procedure(s): RIGHT TOTAL KNEE ARTHROPLASTY (Right)   Depuy 31f, 2t, 10PS poly,  32P  SURGEON:  Surgeon(s) and Role:    * Carole Civil, MD - Primary  PHYSICIAN ASSISTANT:   ASSISTANTS: betty ashley and holly proztek   ANESTHESIA:   general  EBL:  Total I/O In: 1700 [I.V.:1700] Out: 175 [Urine:150; Blood:25]  BLOOD ADMINISTERED:none  DRAINS: hemovac  LOCAL MEDICATIONS USED:  MARCAINE   , Amount: 30 ml and OTHER exparel 20/60  SPECIMEN:  No Specimen  DISPOSITION OF SPECIMEN:  N/A  COUNTS:  YES  TOURNIQUET:   Total Tourniquet Time Documented: Thigh (Right) - 79 minutes Total: Thigh (Right) - 79 minutes   DICTATION: .Viviann Spare Dictation  PLAN OF CARE: Admit to inpatient   PATIENT DISPOSITION:  PACU - hemodynamically stable.   Delay start of Pharmacological VTE agent (>24hrs) due to surgical blood loss or risk of bleeding: yes  Operative report for right total knee arthroplasty  The patient was identified in the preoperative holding area using 2 methods of identification. The operative extremity was evaluated and found to be acceptable for surgery. The chart was reviewed. The surgical site was confirmed and marked.  The patient was taken to the operating room for general anesthesia. Antibiotic per SCIP protocol was used. (ANCEF 2GM). A Foley catheter was inserted. The RIGHT lower extremity was then prepped and draped for surgery using standadr sterile technique.   The surgical team was gowned in [employer]"  The timeout was executed and the surgical site was RIGHT Knee confirmed. Implants were present. X-rays were reviewed and available.  The RIGHT lower extremity was exsanguinated with a six-inch Esmarch and the tourniquet was inflated to 300 mm of mercury. A standard  midline incision was made. The incision was carried down to the extensor mechanism. A medial arthrotomy was performed and the patella was everted. The patellofemoral ligament was released.  The medial soft tissue sleeve was elevated to the mid coronal plane. The anterior cruciate ligament and PCL were resected along with the medial and lateral meniscus.  A 3/8  inch drill bit was used to open up the femoral canal which was decompressed with suction and  irrigation until the irrigation was clear. The distal resection was set for 11 mm 5 RIGHT  The distal resection guide was pinned in place and an oscillating saw was used to remove the distal 11 mm of bone.  The femur sizing guide measured the femur to a size BETWEEN 2/2.5 . A size 2  4-in-1 cutting block was pinned to the distal femur; with ligament retractors in place the 4 femoral cuts were made.  The external alignment guide was secured to the tibia. The stylus was set for 10 mm resection from the higher side of the tibia. Appropriate slope and rotation alignment was confirmed. Landmarks include the medial mid tibial tubercle junction, malleolI., the second toe and tibial shaft. The proximal tibia was then resected. The tibia measured a size 2  FURTHER SOFT TISSUE DISSECTION WAS CARRIED OUT MEDIALLY TO OPEN UP THE MEDIAL SIDE OF THE JOINT   Spacer blocks were placed and the knee balanced with a 10 mm spacer block.  The appropriate-sized femoral notch cutting guide was secured to the distal femur and the notch cut was completed.  Trial components were placed  and range of motion was checked along with stability. The prosthesis was stable with the following components  Femur 2 Tibia 2 Spacer 10   With the trial components in place any residual bone or osteophytes from the posterior condyles was removed with a curved osteotome.  The patella was skeletonized, measured for thickness, IT WAS 21MM and then the patella was resected using the  cutting guide set at 14. The patella was then remeasured and had a final thickness of 12. It was then sized to a size 32. The holes were then drilled in the patella.  The tibial tray was then attached to the proximal tibia and prepared with reaming and punching.  ExParel 20 cc was injected into the knee with specific attention paid to the posterior capsule  The knee was then irrigated while the cement was prepared. The prosthesis was then cemented into place  Excess cement was removed. The cement was allowed to cure under pressurization  Repeat irrigation and any residual cement fragments were removed  The knee was again checked for range of motion, patellar tracking and stability with the trial spacer in place. A polyethylene insert was then placed using a size 10  Irrigation was performed again. A drain was placed. 40 cc of dilute EXPAREL was then injected.  Closure was performed with #1 Bralon for the extensor mechanism. 30 cc of Marcaine with epinephrine was injected into the joint followed by 0 Monocryl for the subcutaneous tissue.  Skin was reapproximated with skin staples.   A sterile bandage was applied along with a TED hose.  The patient taken recovery in stable condition

## 2014-08-12 NOTE — Progress Notes (Signed)
PHARMACIST - PHYSICIAN ORDER COMMUNICATION  CONCERNING: P&T Medication Policy on Herbal Medications  DESCRIPTION:  This patient's order for:  Melatonin  has been noted.  This product(s) is classified as an "herbal" or natural product. Due to a lack of definitive safety studies or FDA approval, nonstandard manufacturing practices, plus the potential risk of unknown drug-drug interactions while on inpatient medications, the Pharmacy and Therapeutics Committee does not permit the use of "herbal" or natural products of this type within [facility].   ACTION TAKEN: The pharmacy department is unable to verify this order at this time and your patient has been informed of this safety policy. Please reevaluate patient's clinical condition at discharge and address if the herbal or natural product(s) should be resumed at that time.  BPittmanRPH  08/12/2014 315 PM

## 2014-08-12 NOTE — Anesthesia Postprocedure Evaluation (Signed)
  Anesthesia Post-op Note  Patient: Teresa Soto  Procedure(s) Performed: Procedure(s): RIGHT TOTAL KNEE ARTHROPLASTY (Right)  Patient Location: PACU  Anesthesia Type:General  Level of Consciousness: awake, alert , oriented and patient cooperative  Airway and Oxygen Therapy: Patient Spontanous Breathing and Patient connected to face mask oxygen  Post-op Pain: mild  Post-op Assessment: Post-op Vital signs reviewed, Patient's Cardiovascular Status Stable, Respiratory Function Stable, Patent Airway, No signs of Nausea or vomiting and Pain level controlled  Post-op Vital Signs: Reviewed and stable  Last Vitals:  Filed Vitals:   08/12/14 1040  BP: 118/76  Temp:   Resp: 20    Complications: No apparent anesthesia complications

## 2014-08-12 NOTE — Transfer of Care (Signed)
Immediate Anesthesia Transfer of Care Note  Patient: Teresa Soto  Procedure(s) Performed: Procedure(s): RIGHT TOTAL KNEE ARTHROPLASTY (Right)  Patient Location: PACU  Anesthesia Type:General  Level of Consciousness: awake, alert , oriented and patient cooperative  Airway & Oxygen Therapy: Patient Spontanous Breathing  Post-op Assessment: Report given to PACU RN and Post -op Vital signs reviewed and stable  Post vital signs: Reviewed and stable  Complications: No apparent anesthesia complications

## 2014-08-12 NOTE — Interval H&P Note (Signed)
History and Physical Interval Note:  08/12/2014 10:38 AM  Teresa Soto  has presented today for surgery, with the diagnosis of right knee osteoarthritis  The various methods of treatment have been discussed with the patient and family. After consideration of risks, benefits and other options for treatment, the patient has consented to  Procedure(s): TOTAL KNEE ARTHROPLASTY (Right) as a surgical intervention .  The patient's history has been reviewed, patient examined, no change in status, stable for surgery.  I have reviewed the patient's chart and labs.  Questions were answered to the patient's satisfaction.     Arther Abbott

## 2014-08-12 NOTE — Anesthesia Procedure Notes (Signed)
Procedure Name: Intubation Date/Time: 08/12/2014 10:57 AM Performed by: Andree Elk, AMY A Pre-anesthesia Checklist: Patient identified, Patient being monitored, Timeout performed, Emergency Drugs available and Suction available Patient Re-evaluated:Patient Re-evaluated prior to inductionOxygen Delivery Method: Circle System Utilized Preoxygenation: Pre-oxygenation with 100% oxygen Intubation Type: IV induction Ventilation: Mask ventilation without difficulty Laryngoscope Size: 3 and Miller Grade View: Grade I Tube type: Oral Tube size: 7.0 mm Number of attempts: 1 Airway Equipment and Method: stylet Placement Confirmation: ETT inserted through vocal cords under direct vision,  positive ETCO2 and breath sounds checked- equal and bilateral Secured at: 21 cm Tube secured with: Tape Dental Injury: Teeth and Oropharynx as per pre-operative assessment

## 2014-08-12 NOTE — Anesthesia Preprocedure Evaluation (Signed)
Anesthesia Evaluation  Patient identified by MRN, date of birth, ID band Patient awake    Reviewed: Allergy & Precautions, H&P , NPO status   History of Anesthesia Complications (+) POST - OP SPINAL HEADACHE and history of anesthetic complications  Airway Mallampati: II  TM Distance: >3 FB     Dental  (+) Teeth Intact   Pulmonary asthma , former smoker,  breath sounds clear to auscultation        Cardiovascular hypertension, Pt. on medications Rhythm:Regular Rate:Normal     Neuro/Psych  Headaches, PSYCHIATRIC DISORDERS Depression  Neuromuscular disease (Sciatica, chronic LBP) CVA, Residual Symptoms    GI/Hepatic negative GI ROS,   Endo/Other    Renal/GU      Musculoskeletal  (+) Arthritis -,   Abdominal   Peds  Hematology   Anesthesia Other Findings   Reproductive/Obstetrics                             Anesthesia Physical Anesthesia Plan  ASA: III  Anesthesia Plan: General   Post-op Pain Management:    Induction: Intravenous  Airway Management Planned: Oral ETT  Additional Equipment:   Intra-op Plan:   Post-operative Plan: Extubation in OR  Informed Consent: I have reviewed the patients History and Physical, chart, labs and discussed the procedure including the risks, benefits and alternatives for the proposed anesthesia with the patient or authorized representative who has indicated his/her understanding and acceptance.     Plan Discussed with:   Anesthesia Plan Comments:         Anesthesia Quick Evaluation

## 2014-08-13 ENCOUNTER — Encounter (HOSPITAL_COMMUNITY): Payer: Self-pay | Admitting: Orthopedic Surgery

## 2014-08-13 LAB — CBC
HCT: 30.3 % — ABNORMAL LOW (ref 36.0–46.0)
HEMOGLOBIN: 10.1 g/dL — AB (ref 12.0–15.0)
MCH: 29.3 pg (ref 26.0–34.0)
MCHC: 33.3 g/dL (ref 30.0–36.0)
MCV: 87.8 fL (ref 78.0–100.0)
Platelets: 250 10*3/uL (ref 150–400)
RBC: 3.45 MIL/uL — AB (ref 3.87–5.11)
RDW: 13.7 % (ref 11.5–15.5)
WBC: 9.2 10*3/uL (ref 4.0–10.5)

## 2014-08-13 LAB — BASIC METABOLIC PANEL
Anion gap: 7 (ref 5–15)
BUN: 9 mg/dL (ref 6–23)
CALCIUM: 10.6 mg/dL — AB (ref 8.4–10.5)
CO2: 26 meq/L (ref 19–32)
CREATININE: 0.81 mg/dL (ref 0.50–1.10)
Chloride: 109 mEq/L (ref 96–112)
GFR calc Af Amer: >90 mL/min (ref >90)
GFR, EST NON AFRICAN AMERICAN: 79 mL/min — AB (ref >90)
Glucose, Bld: 120 mg/dL — ABNORMAL HIGH (ref 70–99)
Potassium: 3.9 mEq/L (ref 3.7–5.3)
Sodium: 142 mEq/L (ref 137–147)

## 2014-08-13 MED ORDER — OXYCODONE HCL ER 15 MG PO T12A
15.0000 mg | EXTENDED_RELEASE_TABLET | Freq: Two times a day (BID) | ORAL | Status: DC
  Administered 2014-08-13 – 2014-08-16 (×7): 15 mg via ORAL
  Filled 2014-08-13 (×7): qty 1

## 2014-08-13 MED ORDER — LISINOPRIL 5 MG PO TABS
2.5000 mg | ORAL_TABLET | Freq: Every day | ORAL | Status: DC
  Administered 2014-08-13 – 2014-08-15 (×3): 2.5 mg via ORAL
  Filled 2014-08-13 (×3): qty 1

## 2014-08-13 NOTE — Care Management Utilization Note (Signed)
UR completed 

## 2014-08-13 NOTE — Plan of Care (Signed)
Problem: Phase I Progression Outcomes Goal: Pain controlled with appropriate interventions Outcome: Completed/Met Date Met:  08/13/14

## 2014-08-13 NOTE — Evaluation (Signed)
Occupational Therapy Evaluation Patient Details Name: Teresa Soto MRN: [id number] DOB: August 24, 1956 Today's Date: 08/13/2014   History of Present Illness  Patient reports pain said she was in her 34s and has difficulty going down the stairs. Her pain is exacerbated when she fell in 2012 she's been basically self-medicating with naproxen 500 mg twice a day no other treatment. Pain is increased with standing and walking. Pain is diffuse. She reports catching as if there is a rough spot in the knee and intermittent giving way episodes associated with stiffness stabbing and aching pain and some swelling. Pain rated 7/10.  Pt underwent TKA on the Rt on 08/12/14: MD orders for WBAT.    Clinical Impression   Pt presenting to acute OT with above situation.  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  Pt very lethargic and had difficulty engagin in OT session.  Pt verbalized confidence that she would be able to complete lower body dressing tasks, but OTR did educate pt on LE dressing AE.  Pt will benefit from additional acute OT session to ensure pt safety with ADL tasks at home, but pt is not currently demonstrating need for [facility] services.      Follow Up Recommendations Home health PT    Equipment Recommendations  Rolling walker with 5" wheels    Recommendations for Other Services OT consult     Precautions / Restrictions Precautions Precautions: Knee;Fall Precaution Comments: Rt TKA Restrictions Weight Bearing Restrictions: Yes RLE Weight Bearing: Weight bearing as tolerated      Mobility  Bed Mobility Overal bed mobility: Needs Assistance Bed Mobility: Supine to Sit;Sit to Supine     Supine to sit: Supervision Sit to supine: Supervision      Transfers Overall transfer level: Needs assistance Equipment used: Rolling walker (2 wheeled)            Ambulation/Gait  Stairs            Wheelchair  Mobility    Modified Rankin (Stroke Patients Only)       Balance                               Pertinent Vitals/Pain Pain Assessment: No/denies pain (Pt received nausea medication prior to OT eval)    Home Living Family/patient expects to be discharged to:: Private residence Living Arrangements: Spouse/significant other;Children Available Help at Discharge: Family;Available 24 hours/day (Husband, daughter at home.  Daughter's mother-in-law (with [facility] experience) is coming to stay.) Type of Home: House Home Access: Stairs to enter Entrance Stairs-Rails: Can reach both Entrance Stairs-Number of Steps: 4 Home Layout: Two level (bedroom on second floor. Bathrroms on 1st and 2nd) Home Equipment: Shower seat - built in;Cane - single point (Pt considering HH shower head) Additional Comments: Walk In Shower    Prior Function Level of Independence: Independent         Comments: occassional use of cane for ambulation.  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     Hand Dominance   Dominant Hand: Right    Extremity/Trunk Assessment   Upper Extremity Assessment: Overall WFL for tasks assessed           Lower Extremity Assessment: Defer to PT evaluation RLE Deficits / Details: Rt knee flexion to 80 degrees       Communication   Communication: No difficulties  Cognition Arousal/Alertness: Lethargic;Suspect due to medications Behavior During Therapy: [facility] for tasks assessed/performed Overall Cognitive Status: Within Functional Limits for tasks assessed                      General Comments      Exercises General Exercises - Lower Extremity Ankle Circles/Pumps: AROM;Both;10 reps;Supine Heel Slides: AROM;Right;5 reps;Supine      Assessment/Plan    PT Assessment Patient needs continued PT services  PT Diagnosis Difficulty walking;Generalized weakness   PT Problem List Decreased strength;Decreased range of  motion;Decreased activity tolerance;Decreased balance;Decreased mobility;Decreased knowledge of use of DME  PT Treatment Interventions DME instruction;Therapeutic exercise;Gait training;Stair training;Functional mobility training;Therapeutic activities;Patient/family education;Neuromuscular re-education;Modalities   PT Goals (Current goals can be found in the Care Plan section) Acute Rehab PT Goals Patient Stated Goal: go home PT Goal Formulation: With patient Time For Goal Achievement: 08/20/14 Potential to Achieve Goals: Good    Frequency Min 6X/week   Barriers to discharge        Co-evaluation               End of Session Equipment Utilized During Treatment: Gait belt Activity Tolerance: Other (comment) (Limited by nausea) Patient left: in bed;with call bell/phone within reach;with bed alarm set Nurse Communication: Mobility status;Other (comment) (Pt requested med for nausea)         Time: 2446-2863 PT Time Calculation (min): 20 min   Charges:   PT Evaluation $Initial PT Evaluation Tier I: 1 Procedure     PT G Codes:          Bea Graff Chadwin Fury, MS, OTR/L [facility] [phone] 08/13/2014, 9:45 AM

## 2014-08-13 NOTE — Progress Notes (Signed)
Subjective: 1 Day Post-Op Procedure(s) (LRB): RIGHT TOTAL KNEE ARTHROPLASTY (Right) Patient reports pain as controlled.    Objective: Vital signs in last 24 hours: BP 98/61 mmHg  Pulse 78  Temp(Src) 98.1 F (36.7 C) (Oral)  Resp 20  Ht 5\' 1"  (1.549 m)  Wt 164 lb (74.39 kg)  BMI 31.00 kg/m2  SpO2 97%  Intake/Output from previous day: 11/04 0701 - 11/05 0700 In: 2020 [P.O.:120; I.V.:1900] Out: 3595 [Urine:3400; Drains:170; Blood:25] Intake/Output this shift:     Recent Labs  08/13/14 0509  HGB 10.1*    Recent Labs  08/13/14 0509  WBC 9.2  RBC 3.45*  HCT 30.3*  PLT 250    Recent Labs  08/13/14 0509  NA 142  K 3.9  CL 109  CO2 26  BUN 9  CREATININE 0.81  GLUCOSE 120*  CALCIUM 10.6*   No results for input(s): LABPT, INR in the last 72 hours.  The knee looks good, she can raise it and lower it and bends it a little   Assessment/Plan: 1 Day Post-Op Procedure(s) (LRB): RIGHT TOTAL KNEE ARTHROPLASTY (Right) Advance diet Up with therapy  Arther Abbott 08/13/2014, 7:04 AM

## 2014-08-13 NOTE — Care Management Note (Signed)
    Page 1 of 2   08/14/2014     3:35:35 PM CARE MANAGEMENT NOTE 08/14/2014  Patient:  Teresa Soto, Teresa Soto   Account Number:  [id number]  Date Initiated:  08/13/2014  Documentation initiated by:  Vladimir Creeks  Subjective/Objective Assessment:   Admitted with RTK following surgery. Pt is from home, is independent, but is having a female family member come to stay with her, post- op.     Action/Plan:   Pt needs a walker, wants a standard, not rolling walker, a 3-in-1 BSC, and a CPM machine   Anticipated DC Date:  08/15/2014   Anticipated DC Plan:  [place]  CM consult      [place]   Choice offered to / List presented to:  C-1 Patient   DME arranged  3-N-1  [place]      DME agency  [place] arranged  [place].   Status of service:  Completed, signed off Medicare Important Message given?   (If response is "NO", the following Medicare IM given date fields will be blank) Date Medicare IM given:   Medicare IM given by:   Date Additional Medicare IM given:   Additional Medicare IM given by:    Discharge Disposition:  [place]  Per UR Regulation:  Reviewed for med. necessity/level of care/duration of stay  If discussed at [place] of Stay Meetings, dates discussed:    Comments:  08/13/14 1200 Vladimir Creeks RN/CM

## 2014-08-13 NOTE — Progress Notes (Signed)
Physical Therapy Treatment Patient Details Name: Teresa Soto MRN: [id number] DOB: 07-19-1956 Today's Date: 08/13/2014    History of Present Illness Patient reports pain said she was in her 41s and has difficulty going down the stairs. Her pain is exacerbated when she fell in 2012 she's been basically self-medicating with naproxen 500 mg twice a day no other treatment. Pain is increased with standing and walking. Pain is diffuse. She reports catching as if there is a rough spot in the knee and intermittent giving way episodes associated with stiffness stabbing and aching pain and some swelling. Pain rated 7/10.  Pt underwent TKA on the Rt on 08/12/14: MD orders for WBAT.     PT Comments    Pt reports increased complaints of pain and stiffness in the Rt knee this afternoon, with reports of being up in chair earlier this morning.  RN made aware, and pt given pain medication during treatment session.  Pt able to complete treatment session with encouragement to participate to decrease stiffness and increase ROM/functional mobility.   Pt was able to complete supine exercises and transfer to EOB, with reports of decrease stiffness after movement.  Pt demonstrates improved gait distance this afternoon, with slow patterning and decrease step length Lt and foot clearance Rt.  CPM applied at end of treatment session with pt tolerating 0-60 degrees ROM.  NT made aware of CPM placement, and time written on board to removed.  Updated d/c plan, as pt prefers std walker over RW.  Educated pt on technique for ascend/descend stairs, and will attempt stair training as able.     Follow Up Recommendations  Home health PT     Equipment Recommendations  Standard walker       Precautions / Restrictions Precautions Precautions: Knee;Fall Precaution Comments: Rt TKA Restrictions Weight Bearing Restrictions: Yes RLE Weight Bearing: Weight bearing as tolerated    Mobility  Bed Mobility Overal bed mobility:  Needs Assistance Bed Mobility: Supine to Sit;Sit to Supine     Supine to sit: Supervision Sit to supine: Supervision      Transfers Overall transfer level: Needs assistance Equipment used: Rolling walker (2 wheeled)   Sit to Stand: Supervision;Min guard         General transfer comment: VC for technique for Rt LE placement  Ambulation/Gait Ambulation/Gait assistance: Supervision;Min guard Ambulation Distance (Feet): 15 Feet Assistive device: Standard walker Gait Pattern/deviations: Step-to pattern;Decreased stance time - right;Decreased dorsiflexion - right;Decreased step length - left   Gait velocity interpretation: at or above normal speed for age/gender     Stairs Stairs:  (Educated pt on technique for ascending/descend stairs with step to gait pattern)                Cognition Arousal/Alertness: Awake/alert Behavior During Therapy: WFL for tasks assessed/performed Overall Cognitive Status: Within Functional Limits for tasks assessed                      Exercises General Exercises - Lower Extremity Ankle Circles/Pumps: AROM;Both;10 reps;Supine Quad Sets: AROM;Both;10 reps;Supine Heel Slides: AROM;Right;10 reps;Supine        Pertinent Vitals/Pain Pain Assessment: 0-10 Pain Score: 9  Pain Location: Rt knee pain, tightness of Rt thigh Pain Descriptors / Indicators: Aching;Throbbing;Tightness Pain Intervention(s): Limited activity within patient's tolerance;RN gave pain meds during session;Repositioned              Frequency  Min 6X/week     End of Session Equipment Utilized During Treatment: Gait  belt Activity Tolerance: Patient limited by pain Patient left: in bed;in CPM;with call bell/phone within reach;with bed alarm set     Time: 1601-0932 PT Time Calculation (min): 34 min  Charges:  $Gait Training: 8-22 mins $Therapeutic Exercise: 8-22 mins                     Talin Rozeboom 08/13/2014, 1:58 PM

## 2014-08-13 NOTE — Plan of Care (Signed)
Problem: Phase I Progression Outcomes Goal: OOB as tolerated unless otherwise ordered Outcome: Completed/Met Date Met:  08/13/14

## 2014-08-13 NOTE — Clinical Social Work Note (Signed)
CSW received referral for possible SNF. PT evaluated pt today and recommendation is for home health. CSW will sign off, but can be reconsulted if needed.  Teresa Soto, [place]

## 2014-08-13 NOTE — Addendum Note (Signed)
Addendum  created 08/13/14 1245 by Mickel Baas, CRNA   Modules edited: Notes Section   Notes Section:  File: [id number]

## 2014-08-13 NOTE — Evaluation (Signed)
Physical Therapy Evaluation Patient Details Name: Teresa Soto MRN: [id number] DOB: 1956/09/26 Today's Date: 08/13/2014   History of Present Illness  Patient reports pain said she was in her 36s and has difficulty going down the stairs. Her pain is exacerbated when she fell in 2012 she's been basically self-medicating with naproxen 500 mg twice a day no other treatment. Pain is increased with standing and walking. Pain is diffuse. She reports catching as if there is a rough spot in the knee and intermittent giving way episodes associated with stiffness stabbing and aching pain and some swelling. Pain rated 7/10.  Pt underwent TKA on the Rt on 08/12/14: MD orders for WBAT.   Clinical Impression  Pt is a 58 year old female who presents to PT after undergoing Rt TKA on 08/12/14, with MD orders for WBAT.  No complaints of pain during PT evaluation, as pt reports she was just given pain medication; pt does report complaints of nausea with movement and medication for nausea given during PT evaluation.  During evaluation, pt required supervision for bed mobility skills, min guard/supervision for transfers with use of RW, and gait of 5 feet with min guard and use of RW.  Gait distance limited by nausea.  Recommend continued PT while in the hospital to address ROM, strength, balance, and activity tolerance for improved functional mobility skills with transition to HHPT.  Recommend use of RW for gait.     Follow Up Recommendations Home health PT    Equipment Recommendations  Rolling walker with 5" wheels    Recommendations for Other Services OT consult     Precautions / Restrictions Precautions Precautions: Knee;Fall Precaution Comments: Rt TKA Restrictions Weight Bearing Restrictions: Yes RLE Weight Bearing: Weight bearing as tolerated      Mobility  Bed Mobility Overal bed mobility: Needs Assistance Bed Mobility: Supine to Sit;Sit to Supine     Supine to sit: Supervision Sit to supine:  Supervision      Transfers Overall transfer level: Needs assistance Equipment used: Rolling walker (2 wheeled) Transfers: Sit to/from Stand Sit to Stand: Supervision;Min guard            Ambulation/Gait Ambulation/Gait assistance: Min guard Ambulation Distance (Feet): 5 Feet Assistive device: Rolling walker (2 wheeled) Gait Pattern/deviations: Step-to pattern;Decreased stance time - right;Decreased stride length;Decreased dorsiflexion - right   Gait velocity interpretation: at or above normal speed for age/gender General Gait Details: Gait distance limited secondary to nausea (pt given medication for nausea during PT evaluation).      Balance Overall balance assessment: Needs assistance Sitting-balance support: Feet supported;Single extremity supported Sitting balance-Leahy Scale: Good     Standing balance support: Bilateral upper extremity supported;During functional activity Standing balance-Leahy Scale: Fair                               Pertinent Vitals/Pain Pain Assessment: No/denies pain (Premedicated prior to PT evaluation)    Home Living Family/patient expects to be discharged to:: Private residence Living Arrangements: Spouse/significant other;Children Available Help at Discharge: Family;Available 24 hours/day (Husband/daughter live with pt, daughters mother in law will be coming to stay and help also) Type of Home: House Home Access: Stairs to enter Entrance Stairs-Rails: Can reach both Entrance Stairs-Number of Steps: 4 Home Layout: Two level (Bedroom on 2nd floor, bathroom on 1st and 2nd floor) Home Equipment: Shower seat - built in;Cane - single point Additional Comments: Walk In [employer]    Prior  Function Level of Independence: Independent               Hand Dominance   Dominant Hand: Right    Extremity/Trunk Assessment   Upper Extremity Assessment: Defer to OT evaluation           Lower Extremity Assessment: RLE  deficits/detail RLE Deficits / Details: Rt knee flexion to 80 degrees       Communication   Communication: No difficulties  Cognition Arousal/Alertness: Lethargic;Suspect due to medications Behavior During Therapy: [facility] for tasks assessed/performed Overall Cognitive Status: Within Functional Limits for tasks assessed                         Exercises General Exercises - Lower Extremity Ankle Circles/Pumps: AROM;Both;10 reps;Supine Heel Slides: AROM;Right;5 reps;Supine      Assessment/Plan    PT Assessment Patient needs continued PT services  PT Diagnosis Difficulty walking;Generalized weakness   PT Problem List Decreased strength;Decreased range of motion;Decreased activity tolerance;Decreased balance;Decreased mobility;Decreased knowledge of use of DME  PT Treatment Interventions DME instruction;Therapeutic exercise;Gait training;Stair training;Functional mobility training;Therapeutic activities;Patient/family education;Neuromuscular re-education;Modalities   PT Goals (Current goals can be found in the Care Plan section) Acute Rehab PT Goals Patient Stated Goal: go home PT Goal Formulation: With patient Time For Goal Achievement: 08/20/14 Potential to Achieve Goals: Good    Frequency Min 6X/week    End of Session Equipment Utilized During Treatment: Gait belt Activity Tolerance: Other (comment) (Limited by nausea) Patient left: in bed;with call bell/phone within reach;with bed alarm set Nurse Communication: Mobility status;Other (comment) (Pt requested med for nausea)         Time: 7342-8768 PT Time Calculation (min): 20 min   Charges:   PT Evaluation $Initial PT Evaluation Tier I: 1 Procedure     Honest Safranek 08/13/2014, 9:24 AM

## 2014-08-13 NOTE — Anesthesia Postprocedure Evaluation (Signed)
  Anesthesia Post-op Note  Patient: Teresa Soto  Procedure(s) Performed: Procedure(s): RIGHT TOTAL KNEE ARTHROPLASTY (Right)  Patient Location:Room 338  Anesthesia Type:General  Level of Consciousness: awake, alert , oriented and patient cooperative  Airway and Oxygen Therapy: Patient Spontanous Breathing  Post-op Pain: mild  Post-op Assessment: Post-op Vital signs reviewed, Patient's Cardiovascular Status Stable, Respiratory Function Stable, Patent Airway and Pain level controlled  Post-op Vital Signs: Reviewed and stable  Last Vitals:  Filed Vitals:   08/13/14 1039  BP: 113/60  Pulse: 85  Temp: 36.9 C  Resp: 20    Complications: No apparent anesthesia complications  Mild nausea noted today while working with PT

## 2014-08-14 LAB — CBC
HEMATOCRIT: 30.1 % — AB (ref 36.0–46.0)
Hemoglobin: 9.8 g/dL — ABNORMAL LOW (ref 12.0–15.0)
MCH: 29 pg (ref 26.0–34.0)
MCHC: 32.6 g/dL (ref 30.0–36.0)
MCV: 89.1 fL (ref 78.0–100.0)
Platelets: 244 10*3/uL (ref 150–400)
RBC: 3.38 MIL/uL — ABNORMAL LOW (ref 3.87–5.11)
RDW: 13.9 % (ref 11.5–15.5)
WBC: 8.4 10*3/uL (ref 4.0–10.5)

## 2014-08-14 NOTE — Progress Notes (Signed)
Occupational Therapy Treatment & Discharge Summary Patient Details Name: Teresa Soto MRN: [id number] DOB: 1956/02/25 Today's Date: 08/14/2014       OT Comments    Patient was sleeping upon therapy arrival.  Patient was agreeable to treatment session. Patient demonstrated good safety awareness during ADL tasks, at supervision level. Patient reports family member was previously a home health aid and is coming to stay with her temporarily. All OT goals have been met and patient will be discharged from OT services.            Precautions / Restrictions Precautions Precautions: Knee Restrictions Weight Bearing Restrictions: Yes RLE Weight Bearing: Weight bearing as tolerated    Mobility  Bed Mobility Overal bed mobility: Modified Independent Bed Mobility: Supine to Sit     Supine to sit: Modified independent (Device/Increase time) Sit to supine: Modified independent (Device/Increase time)   General bed mobility comments: use of hands to move her leg over in the bed    ADL: Toilet transfer: supervision Clothing management: supervision Ambulation: rolling walker            OT Goals (current goals can now be found in the care plan section) Progress towards PT goals: Progressing toward goals       OT Plan Current plan remains appropriateDischarge from therapy       End of Session   Activity Tolerance: Patient tolerated treatment well Patient left: in bed;in CPM;with call bell/phone within reach     Time: 1833-5825 PT Time Calculation (min): 34 min  Charges:  $Gait Training: 8-22 mins $Therapeutic Exercise: 8-22 mins                    G Codes:      [name]. OT Student  08/14/2014, 1:22 PM

## 2014-08-14 NOTE — Plan of Care (Signed)
Problem: Phase I Progression Outcomes Goal: Vital signs/hemodynamically stable Outcome: Completed/Met Date Met:  08/14/14     

## 2014-08-14 NOTE — Progress Notes (Signed)
POD # 2 RT TKA  BP 109/63 mmHg  Pulse 98  Temp(Src) 99.1 F (37.3 C) (Oral)  Resp 18  Ht 5\' 1"  (1.549 m)  Wt 74.39 kg (164 lb)  BMI 31.00 kg/m2  SpO2 97%  DRESSING CHANGED AND DRAIN REMOVED  EVERY THING LOOKS GOOD   D/C TOMORROW

## 2014-08-14 NOTE — Clinical Documentation Improvement (Signed)
Presents with Osteoarthritis of right knee; TKR performed.  Please clarify the type of Osteoarthritis the patient has: Primary           Secondary           Erosive           Traumatic           Other    Please document findings in next progress note and include in discharge summary.  Thank You, Toshika Parrow T Dalonte Hardage ,RN Clinical Documentation Specialist:  [phone]  [facility] 

## 2014-08-14 NOTE — Plan of Care (Signed)
Problem: Consults Goal: General Surgical Patient Education (See Patient Education module for education specifics)  Outcome: Completed/Met Date Met:  08/14/14 Goal: Skin Care Protocol Initiated - if Braden Score 18 or less If consults are not indicated, leave blank or document N/A  Outcome: Completed/Met Date Met:  08/14/14 Goal: Nutrition Consult-if indicated Outcome: Not Applicable Date Met:  32/44/01 Goal: Diabetes Guidelines if Diabetic/Glucose > 140 If diabetic or lab glucose is > 140 mg/dl - Initiate Diabetes/Hyperglycemia Guidelines & Document Interventions  Outcome: Not Applicable Date Met:  02/72/53  Problem: Phase I Progression Outcomes Goal: Incision/dressings dry and intact Outcome: Completed/Met Date Met:  08/14/14 Dressing intact with old drainage. Goal: Tubes/drains patent Outcome: Completed/Met Date Met:  08/14/14 Hemovac patent and draining to suction. Goal: Initial discharge plan identified Outcome: Completed/Met Date Met:  08/14/14 Anticipated discharge 11/7 with [facility] PT. Goal: Other Phase I Outcomes/Goals Outcome: Not Applicable Date Met:  66/44/03  Problem: Phase II Progression Outcomes Goal: Pain controlled Outcome: Completed/Met Date Met:  08/14/14 Patient reports pain is controlled at rest and with activity. Goal: Progress activity as tolerated unless otherwise ordered Outcome: Completed/Met Date Met:  08/14/14 Goal: Vital signs stable Outcome: Completed/Met Date Met:  08/14/14 Goal: Surgical site without signs of infection Outcome: Completed/Met Date Met:  08/14/14

## 2014-08-14 NOTE — Plan of Care (Signed)
Problem: Phase I Progression Outcomes Goal: Voiding-avoid urinary catheter unless indicated Outcome: Completed/Met Date Met:  08/14/14     

## 2014-08-14 NOTE — Progress Notes (Signed)
Primary osteoarthritis

## 2014-08-14 NOTE — Progress Notes (Signed)
Physical Therapy Treatment Patient Details Name: Teresa Soto MRN: [id number] DOB: [birth year] Today's Date: 08/14/2014    History of Present Illness Pt s/p R TKR    PT Comments    Pt is making excellent progress with mobility. Pt reports walking is easy, but has difficulty with the exercises. Pt has great R Knee ROM 0-100 degrees on edge of bed. Pt needs continued strengthening to improve mobility and independence. Pt is self limiting her walking. She expressed some anxiety about walking in the hall due to feeling strange from her pain medication. Pt ambulated 30 ft without difficulty using a SW. Recommend continued acute PT until D/C home tomorrow.  Follow Up Recommendations  Home health PT     Equipment Recommendations  Standard walker    Recommendations for Other Services       Precautions / Restrictions Precautions Precautions: Knee Precaution Comments: Rt TKA Restrictions Weight Bearing Restrictions: Yes RLE Weight Bearing: Weight bearing as tolerated    Mobility  Bed Mobility Overal bed mobility: Modified Independent Bed Mobility: Supine to Sit     Supine to sit: Modified independent (Device/Increase time) Sit to supine: Modified independent (Device/Increase time)   General bed mobility comments: use of hands to move her leg over in the bed  Transfers Overall transfer level: Modified independent Equipment used: Standard walker   Sit to Stand: Modified independent (Device/Increase time)            Ambulation/Gait Ambulation/Gait assistance: Supervision Ambulation Distance (Feet): 30 Feet Assistive device: Standard walker     Gait velocity interpretation: Below normal speed for age/gender General Gait Details: Pt expressed some anxiety about walking in the hall. Pt reported the pain medication makes her feel whoozy and she gio to the door and declined going further. Pt did not demonstrate any unsteadiness with gait. Encouraged pt to takes laps to and  from the bed rather than walk in the hall.   Stairs Stairs:  (not attempted due to pt declined walking in the hall)          Wheelchair Mobility    Modified Rankin (Stroke Patients Only)       Balance Overall balance assessment: No apparent balance deficits (not formally assessed)                                  Cognition Arousal/Alertness: Awake/alert Behavior During Therapy: WFL for tasks assessed/performed Overall Cognitive Status: Within Functional Limits for tasks assessed                      Exercises Total Joint Exercises Quad Sets: AROM;Strengthening;Right;10 reps;Supine Heel Slides: AROM;Right;10 reps;Supine Hip ABduction/ADduction: AAROM;Strengthening;Right;10 reps;Supine Straight Leg Raises: AAROM;Strengthening;Right;10 reps;Supine Long Arc Quad: AAROM;Strengthening;Right;10 reps;Seated Knee Flexion: AROM;Strengthening;Right;10 reps Goniometric ROM: 0-100 degrees    General Comments General comments (skin integrity, edema, etc.): Placed pt in cpm 0-75 degrees      Pertinent Vitals/Pain Pain Assessment: 0-10 Pain Score: 8  Pain Location: R Knee Pain Descriptors / Indicators: Aching Pain Intervention(s): Limited activity within patient's tolerance;Repositioned;Monitored during session    Home Living                      Prior Function            PT Goals (current goals can now be found in the care plan section) Progress towards PT goals: Progressing toward goals    Frequency  Min 6X/week    PT Plan Current plan remains appropriate    Co-evaluation             End of Session   Activity Tolerance: Patient tolerated treatment well Patient left: in bed;in CPM;with call bell/phone within reach     Time: 0856-0930 PT Time Calculation (min): 34 min  Charges:  $Gait Training: 8-22 mins $Therapeutic Exercise: 8-22 mins                    G Codes:      Teresa Soto 08/14/2014, 9:39 AM

## 2014-08-14 NOTE — Plan of Care (Signed)
Problem: Acute Rehab OT Goals (only OT should resolve) Goal: Pt. Will Perform Lower Body Dressing Outcome: Completed/Met Date Met:  08/14/14 Goal: Pt. Will Transfer To Toilet Outcome: Completed/Met Date Met:  08/14/14 Goal: Pt. Will Perform Toileting-Clothing Manipulation Outcome: Completed/Met Date Met:  08/14/14

## 2014-08-15 ENCOUNTER — Encounter (HOSPITAL_COMMUNITY): Payer: Self-pay | Admitting: Orthopedic Surgery

## 2014-08-15 ENCOUNTER — Inpatient Hospital Stay (HOSPITAL_COMMUNITY): Payer: BC Managed Care – PPO

## 2014-08-15 DIAGNOSIS — J810 Acute pulmonary edema: Secondary | ICD-10-CM | POA: Diagnosis not present

## 2014-08-15 LAB — CBC
HCT: 28.9 % — ABNORMAL LOW (ref 36.0–46.0)
Hemoglobin: 9.7 g/dL — ABNORMAL LOW (ref 12.0–15.0)
MCH: 29.8 pg (ref 26.0–34.0)
MCHC: 33.6 g/dL (ref 30.0–36.0)
MCV: 88.9 fL (ref 78.0–100.0)
Platelets: 232 10*3/uL (ref 150–400)
RBC: 3.25 MIL/uL — ABNORMAL LOW (ref 3.87–5.11)
RDW: 13.5 % (ref 11.5–15.5)
WBC: 9.6 10*3/uL (ref 4.0–10.5)

## 2014-08-15 MED ORDER — FUROSEMIDE 40 MG PO TABS
40.0000 mg | ORAL_TABLET | Freq: Two times a day (BID) | ORAL | Status: DC
  Administered 2014-08-15 – 2014-08-16 (×3): 40 mg via ORAL
  Filled 2014-08-15 (×3): qty 1

## 2014-08-15 MED ORDER — CIPROFLOXACIN HCL 250 MG PO TABS
500.0000 mg | ORAL_TABLET | Freq: Two times a day (BID) | ORAL | Status: DC
  Administered 2014-08-15 – 2014-08-16 (×3): 500 mg via ORAL
  Filled 2014-08-15 (×3): qty 2

## 2014-08-15 MED ORDER — ALBUTEROL SULFATE (2.5 MG/3ML) 0.083% IN NEBU
2.5000 mg | INHALATION_SOLUTION | Freq: Four times a day (QID) | RESPIRATORY_TRACT | Status: DC
  Administered 2014-08-15 (×2): 2.5 mg via RESPIRATORY_TRACT
  Filled 2014-08-15 (×2): qty 3

## 2014-08-15 NOTE — Progress Notes (Addendum)
Subjective: 3 Days Post-Op Procedure(s) (LRB): RIGHT TOTAL KNEE ARTHROPLASTY (Right)   Objective: BP 107/62 mmHg  Pulse 107  Temp(Src) 98.8 F (37.1 C) (Oral)  Resp 16  Ht 5\' 1"  (1.549 m)  Wt 74.39 kg (164 lb)  BMI 31.00 kg/m2  SpO2 94% T max 102.5  Intake/Output from previous day: 11/06 0701 - 11/07 0700 In: 680 [P.O.:240; IV Piggyback:440] Out: 86 [Urine:1; Drains:85] Intake/Output this shift:     Recent Labs  08/13/14 0509 08/14/14 0505 08/15/14 0703  HGB 10.1* 9.8* 9.7*    Recent Labs  08/14/14 0505 08/15/14 0703  WBC 8.4 9.6  RBC 3.38* 3.25*  HCT 30.1* 28.9*  PLT 244 232    Recent Labs  08/13/14 0509  NA 142  K 3.9  CL 109  CO2 26  BUN 9  CREATININE 0.81  GLUCOSE 120*  CALCIUM 10.6*   No results for input(s): LABPT, INR in the last 72 hours.  Neurologically intact Neurovascular intact Sensation intact distally Intact pulses distally Dorsiflexion/Plantar flexion intact Incision: scant drainage No cellulitis present Compartment soft   cxr today  IMPRESSION: 1. Low inspiratory volumes with diffuse interstitial prominence bilaterally. Differential considerations include atypical infection and mild pulmonary edema. 2. Cardiac size within normal limits.   Assessment/Plan: 3 Days Post-Op Procedure(s) (LRB): RIGHT TOTAL KNEE ARTHROPLASTY (Right) Up with therapy  t max 102.5. The wound is clean. cxr showed pulmonary edema. Hold discharge.  Start lasix cipro prophylactic antibiotic for pnemonia Alb nebs cxr in am   Arther Abbott 08/15/2014, 9:41 AM

## 2014-08-15 NOTE — Progress Notes (Signed)
Physical Therapy Session Note  Patient Details  Name: Teresa Soto MRN: [id number] Date of Birth: 06/27/1956       Therapy Documentation Precautions:  Precautions Precautions: None Precaution Comments: Rt TKA Restrictions Weight Bearing Restrictions: No RLE Weight Bearing: Weight bearing as tolerated General: Pt is progressing well.    Vital Signs: Therapy Vitals Temp: 98.8 F (37.1 C) Temp Source: Oral Pulse Rate: (!) 107 Resp: 16 BP: 107/62 mmHg Patient Position (if appropriate): Lying Oxygen Therapy SpO2: 94 % O2 Device: Not Delivered Pain: Pain Assessment Pain Assessment: No/denies pain Pain Score: 0-No pain Mobility:  bed mobility and transfers are I  Locomotion : Ambulation Ambulation Distance (Feet): 85 Feet    Exercises: Total Joint Exercises Ankle Circles/Pumps: Both;10 reps Quad Sets: Both;10 reps Heel Slides: Right;10 reps Hip ABduction/ADduction: Right;10 reps Straight Leg Raises: Right;10 reps Goniometric ROM: 8-105 RUSSELL,CINDY 08/15/2014, 11:38 AM

## 2014-08-15 NOTE — Plan of Care (Signed)
Problem: Phase I Progression Outcomes Goal: Sutures/staples intact Outcome: Completed/Met Date Met:  08/15/14  Problem: Phase II Progression Outcomes Goal: Progressing with IS, TCDB Outcome: Completed/Met Date Met:  08/15/14 Goal: Dressings dry/intact Outcome: Completed/Met Date Met:  08/15/14 Goal: Sutures/staples intact Outcome: Completed/Met Date Met:  08/15/14 Goal: Return of bowel function (flatus, BM) IF ABDOMINAL SURGERY:  Outcome: Progressing Bowel sounds present; however patient has not BM in three days. Appropriate interventions in place. Goal: Foley discontinued Outcome: Completed/Met Date Met:  08/15/14 Goal: Tolerating diet Outcome: Completed/Met Date Met:  08/15/14 Goal: Other Phase II Outcomes/Goals Outcome: Not Applicable Date Met:  70/48/88  Problem: Phase III Progression Outcomes Goal: Pain controlled on oral analgesia Outcome: Completed/Met Date Met:  08/15/14 Pain has been sufficiently controlled with Oxycontin and Lyrica. Goal: Activity at appropriate level-compared to baseline (UP IN CHAIR FOR HEMODIALYSIS)  Outcome: Completed/Met Date Met:  08/15/14 Goal: Voiding independently Outcome: Completed/Met Date Met:  08/15/14 Goal: IV changed to normal saline lock Outcome: Completed/Met Date Met:  08/15/14 Goal: Nasogastric tube discontinued Outcome: Not Applicable Date Met:  91/69/45 Goal: Demonstrates TCDB, IS independently Outcome: Completed/Met Date Met:  08/15/14 Goal: Other Phase III Outcomes/Goals Outcome: Not Applicable Date Met:  03/88/82  Problem: Discharge Progression Outcomes Goal: Barriers To Progression Addressed/Resolved Outcome: Progressing CXR scheduled for AM to reevaluate pulmonary edema. Goal: Pain controlled with appropriate interventions Outcome: Completed/Met Date Met:  08/15/14 Goal: Hemodynamically stable Outcome: Progressing Patient ran a low-grade temperature this afternoon. Currently 99.27F.

## 2014-08-15 NOTE — Progress Notes (Signed)
Patient has an oral temp of 102.5. Paged Dr [name], will follow orders to get a chest x-ray and continue to monitor the patient.

## 2014-08-16 ENCOUNTER — Inpatient Hospital Stay (HOSPITAL_COMMUNITY): Payer: BC Managed Care – PPO

## 2014-08-16 MED ORDER — ALBUTEROL SULFATE (2.5 MG/3ML) 0.083% IN NEBU
2.5000 mg | INHALATION_SOLUTION | Freq: Two times a day (BID) | RESPIRATORY_TRACT | Status: DC
  Administered 2014-08-16: 2.5 mg via RESPIRATORY_TRACT
  Filled 2014-08-16: qty 3

## 2014-08-16 MED ORDER — OXYCODONE-ACETAMINOPHEN 5-325 MG PO TABS: 1.0000 | ORAL_TABLET | ORAL | Status: DC

## 2014-08-16 MED ORDER — ASPIRIN 325 MG PO TBEC: 325.0000 mg | DELAYED_RELEASE_TABLET | Freq: Two times a day (BID) | ORAL | Status: DC

## 2014-08-16 MED ORDER — FUROSEMIDE 40 MG PO TABS: 20.0000 mg | ORAL_TABLET | Freq: Every day | ORAL | Status: DC

## 2014-08-16 MED ORDER — POLYETHYLENE GLYCOL 3350 17 G PO PACK: 17.0000 g | PACK | Freq: Every day | ORAL | Status: DC

## 2014-08-16 MED ORDER — CIPROFLOXACIN HCL 500 MG PO TABS: 500.0000 mg | ORAL_TABLET | Freq: Two times a day (BID) | ORAL | Status: DC

## 2014-08-16 NOTE — Progress Notes (Signed)
Subjective: 4 Days Post-Op Procedure(s) (LRB): RIGHT TOTAL KNEE ARTHROPLASTY (Right) Patient reports pain as mild.    Objective: Vital signs in last 24 hours: BP 120/63 mmHg  Pulse 98  Temp(Src) 99.2 F (37.3 C) (Oral)  Resp 20  Ht 5\' 1"  (1.549 m)  Wt 74.39 kg (164 lb)  BMI 31.00 kg/m2  SpO2 9%  Intake/Output from previous day: 11/07 0701 - 11/08 0700 In: 950 [P.O.:840; IV Piggyback:110] Out: 6 [Urine:6] Intake/Output this shift:     Recent Labs  08/14/14 0505 08/15/14 0703  HGB 9.8* 9.7*    Recent Labs  08/14/14 0505 08/15/14 0703  WBC 8.4 9.6  RBC 3.38* 3.25*  HCT 30.1* 28.9*  PLT 244 232   No results for input(s): NA, K, CL, CO2, BUN, CREATININE, GLUCOSE, CALCIUM in the last 72 hours. No results for input(s): LABPT, INR in the last 72 hours.  Neurologically intact  Assessment/Plan: 4 Days Post-Op Procedure(s) (LRB): RIGHT TOTAL KNEE ARTHROPLASTY (Right) Discharge home with home health  Arther Abbott 08/16/2014, 8:55 AM

## 2014-08-16 NOTE — Progress Notes (Signed)
NURSING PROGRESS NOTE  Teresa Soto [id number] Discharge Data: 08/16/2014 12:04 PM Attending Provider: No att. providers found [id number], Lonell Grandchild, MD   Aris Everts to be D/C'd Home per MD order.    All IV's discontinued and monitored for bleeding.  All belongings returned to patient for patient to take home.  AVS summary and prescriptions reviewed with patient. [facility] brought mobility aids to home. Husband stated walker was at home already.  Patient left floor via wheelchair, escorted by NT.  Last Documented Vital Signs:  Blood pressure 120/63, pulse 98, temperature 99.2 F (37.3 C), temperature source Oral, resp. rate 18, height 5\' 1"  (1.549 m), weight 74.39 kg (164 lb), SpO2 98 %.  Cecilie Kicks D

## 2014-08-16 NOTE — Discharge Summary (Signed)
Physician Discharge Summary  Patient ID: Teresa Soto MRN: [id number] DOB/AGE: 07/03/1956 58 y.o.  Admit date: 08/12/2014 Discharge date: 08/16/2014  Admission Diagnoses: osteoarthritis right knee  Discharge Diagnoses: osteoarthritis right knee, postop atelectasis Active Problems:   Arthritis of knee, right   Postoperative pulmonary edema   Discharged Condition: good  Hospital Course:  Hospital course. The patient was admitted for surgery on November fourth.she had an uncomplicated right total knee arthroplasty with a [employer] posterior stabilized total knee replacement. We used a size 2 femur sized 2 tibia size 10 polyethylene insert and a size 32 patella. She had general anesthesia. Postoperatively on postop day 2 she developed a 102.5 temperature and had a chest x-ray which showed possible pulmonary edema. This was treated with Lasix and prophylactic Cipro. Post Lasix chest x-ray showed improvement. The patient never had any clinical symptoms of shortness of breath. We also gave her some albuterol nebs. Her temperature at discharge 99.5. She did very well with physical therapy ambulating 85 feet and her range of motion was 105.  Discharge hemoglobin 9.7  Discharge Exam: Blood pressure 120/63, pulse 98, temperature 99.2 F (37.3 C), temperature source Oral, resp. rate 20, height 5\' 1"  (1.549 m), weight 74.39 kg (164 lb), SpO2 9 %. She remains neurovascularly intact she has no breathing issues her wound is clean dry and intact her calf is soft and supple her Homans sign is negative    Disposition: 01-Home or Self Care  Discharge Instructions    CPM    Complete by:  As directed   Continuous passive motion machine (CPM):      Use the CPM from 0 to 70 for 6-8 hours per day.      You may increase by 10 per day.  You may break it up into 2 or 3 sessions per day.      Use CPM for 3 weeks or until you are told to stop.     Call MD / Call 911    Complete by:  As directed   If you  experience chest pain or shortness of breath, CALL 911 and be transported to the hospital emergency room.  If you develope a fever above 101 F, pus (white drainage) or increased drainage or redness at the wound, or calf pain, call your surgeon's office.     Change dressing    Complete by:  As directed   Change dressing on monday, then change the dressing daily with sterile 4 x 4 inch gauze dressing and apply TED hose.  You may clean the incision with alcohol prior to redressing.     Constipation Prevention    Complete by:  As directed   Drink plenty of fluids.  Prune juice may be helpful.  You may use a stool softener, such as Colace (over the counter) 100 mg twice a day.  Use MiraLax (over the counter) for constipation as needed.     Diet - low sodium heart healthy    Complete by:  As directed      Do not put a pillow under the knee. Place it under the heel.    Complete by:  As directed      Driving restrictions    Complete by:  As directed   No driving for 2 weeks     Increase activity slowly as tolerated    Complete by:  As directed      TED hose    Complete by:  As directed  Use stockings (TED hose) for 2 weeks on both leg(s).  You may remove them at night for sleeping.            Medication List    STOP taking these medications        naproxen 500 MG tablet  Commonly known as:  NAPROSYN      TAKE these medications        albuterol 108 (90 BASE) MCG/ACT inhaler  Commonly known as:  PROVENTIL HFA;VENTOLIN HFA  Inhale 2 puffs into the lungs every 6 (six) hours as needed. Shortness of Breath     aspirin 325 MG EC tablet  Take 1 tablet (325 mg total) by mouth 2 (two) times daily.     ciprofloxacin 500 MG tablet  Commonly known as:  CIPRO  Take 1 tablet (500 mg total) by mouth 2 (two) times daily.     escitalopram 20 MG tablet  Commonly known as:  LEXAPRO  Take 1 tablet (20 mg total) by mouth daily.     furosemide 40 MG tablet  Commonly known as:  LASIX  Take 0.5  tablets (20 mg total) by mouth daily.     lisinopril 10 MG tablet  Commonly known as:  PRINIVIL,ZESTRIL  TAKE ONE TABLET BY MOUTH ONCE DAILY     Melatonin 10 MG Tabs  Take 1 tablet by mouth at bedtime.     mometasone 50 MCG/ACT nasal spray  Commonly known as:  NASONEX  Place 2 sprays into the nose daily.     montelukast 10 MG tablet  Commonly known as:  SINGULAIR  TAKE ONE TABLET BY MOUTH ONCE DAILY AT BEDTIME     oxyCODONE-acetaminophen 5-325 MG per tablet  Commonly known as:  PERCOCET/ROXICET  Take 1 tablet by mouth every 4 (four) hours.     polyethylene glycol packet  Commonly known as:  MIRALAX / GLYCOLAX  Take 17 g by mouth daily.     pramipexole 0.25 MG tablet  Commonly known as:  MIRAPEX  Take 0.25 mg by mouth at bedtime.           Follow-up Information    Follow up with Arther Abbott, MD.   Specialties:  Orthopedic Surgery, Radiology   Contact information:   [address] [place] [address] [phone]       Signed: Arther Abbott 08/16/2014, 9:03 AM

## 2014-08-17 ENCOUNTER — Emergency Department (HOSPITAL_COMMUNITY): Payer: BC Managed Care – PPO

## 2014-08-17 ENCOUNTER — Encounter (HOSPITAL_COMMUNITY): Payer: Self-pay | Admitting: *Deleted

## 2014-08-17 ENCOUNTER — Emergency Department (HOSPITAL_COMMUNITY)
Admission: EM | Admit: 2014-08-17 | Discharge: 2014-08-17 | Disposition: A | Discharge status: Home / Self Care | Payer: BC Managed Care – PPO | Attending: Emergency Medicine | Admitting: Emergency Medicine

## 2014-08-17 DIAGNOSIS — Z7952 Long term (current) use of systemic steroids: Secondary | ICD-10-CM | POA: Diagnosis not present

## 2014-08-17 DIAGNOSIS — Z79899 Other long term (current) drug therapy: Secondary | ICD-10-CM | POA: Insufficient documentation

## 2014-08-17 DIAGNOSIS — K5900 Constipation, unspecified: Secondary | ICD-10-CM | POA: Insufficient documentation

## 2014-08-17 DIAGNOSIS — G8929 Other chronic pain: Secondary | ICD-10-CM | POA: Diagnosis not present

## 2014-08-17 DIAGNOSIS — G2581 Restless legs syndrome: Secondary | ICD-10-CM | POA: Diagnosis not present

## 2014-08-17 DIAGNOSIS — R112 Nausea with vomiting, unspecified: Secondary | ICD-10-CM | POA: Diagnosis present

## 2014-08-17 DIAGNOSIS — M1990 Unspecified osteoarthritis, unspecified site: Secondary | ICD-10-CM | POA: Diagnosis not present

## 2014-08-17 DIAGNOSIS — Z8673 Personal history of transient ischemic attack (TIA), and cerebral infarction without residual deficits: Secondary | ICD-10-CM | POA: Diagnosis not present

## 2014-08-17 DIAGNOSIS — F329 Major depressive disorder, single episode, unspecified: Secondary | ICD-10-CM | POA: Diagnosis not present

## 2014-08-17 DIAGNOSIS — I10 Essential (primary) hypertension: Secondary | ICD-10-CM | POA: Diagnosis not present

## 2014-08-17 DIAGNOSIS — Z87891 Personal history of nicotine dependence: Secondary | ICD-10-CM | POA: Insufficient documentation

## 2014-08-17 DIAGNOSIS — J45909 Unspecified asthma, uncomplicated: Secondary | ICD-10-CM | POA: Insufficient documentation

## 2014-08-17 DIAGNOSIS — Z7982 Long term (current) use of aspirin: Secondary | ICD-10-CM | POA: Diagnosis not present

## 2014-08-17 DIAGNOSIS — R110 Nausea: Secondary | ICD-10-CM

## 2014-08-17 LAB — BASIC METABOLIC PANEL
Anion gap: 11 (ref 5–15)
BUN: 12 mg/dL (ref 6–23)
CALCIUM: 12.2 mg/dL — AB (ref 8.4–10.5)
CO2: 28 mEq/L (ref 19–32)
CREATININE: 0.85 mg/dL (ref 0.50–1.10)
Chloride: 99 mEq/L (ref 96–112)
GFR calc Af Amer: 86 mL/min — ABNORMAL LOW (ref >90)
GFR calc non Af Amer: 75 mL/min — ABNORMAL LOW (ref >90)
Glucose, Bld: 124 mg/dL — ABNORMAL HIGH (ref 70–99)
Potassium: 3.8 mEq/L (ref 3.7–5.3)
Sodium: 138 mEq/L (ref 137–147)

## 2014-08-17 LAB — TYPE AND SCREEN
ABO/RH(D): A POS
ANTIBODY SCREEN: NEGATIVE
Unit division: 0
Unit division: 0

## 2014-08-17 LAB — CBC WITH DIFFERENTIAL/PLATELET
Basophils Absolute: 0 10*3/uL (ref 0.0–0.1)
Basophils Relative: 0 % (ref 0–1)
EOS PCT: 2 % (ref 0–5)
Eosinophils Absolute: 0.2 10*3/uL (ref 0.0–0.7)
HCT: 31.5 % — ABNORMAL LOW (ref 36.0–46.0)
HEMOGLOBIN: 10.6 g/dL — AB (ref 12.0–15.0)
LYMPHS PCT: 21 % (ref 12–46)
Lymphs Abs: 1.6 10*3/uL (ref 0.7–4.0)
MCH: 29.5 pg (ref 26.0–34.0)
MCHC: 33.7 g/dL (ref 30.0–36.0)
MCV: 87.7 fL (ref 78.0–100.0)
MONO ABS: 0.6 10*3/uL (ref 0.1–1.0)
MONOS PCT: 8 % (ref 3–12)
Neutro Abs: 5 10*3/uL (ref 1.7–7.7)
Neutrophils Relative %: 69 % (ref 43–77)
Platelets: 394 10*3/uL (ref 150–400)
RBC: 3.59 MIL/uL — ABNORMAL LOW (ref 3.87–5.11)
RDW: 13 % (ref 11.5–15.5)
WBC: 7.4 10*3/uL (ref 4.0–10.5)

## 2014-08-17 MED ORDER — ONDANSETRON HCL 4 MG/2ML IJ SOLN
4.0000 mg | Freq: Once | INTRAMUSCULAR | Status: AC
  Administered 2014-08-17: 4 mg via INTRAVENOUS
  Filled 2014-08-17: qty 2

## 2014-08-17 MED ORDER — ONDANSETRON 4 MG PO TBDP: ORAL_TABLET | ORAL | Status: DC

## 2014-08-17 NOTE — Discharge Instructions (Signed)
Drink plenty of fluids.   Take magnesium citrate or M.O.M.  For constipation

## 2014-08-17 NOTE — ED Notes (Signed)
N/v since left hospital after having a rt knee replacement.

## 2014-08-17 NOTE — ED Provider Notes (Signed)
CSN: [id number]     Arrival date & time 08/17/14  1816 History  This chart was scribe for Maudry Diego, MD by Judithann Sauger, ED Scribe. The patient was seen in room APA19/[facility]19 and the patient's care was started at 7:53 PM.    Chief Complaint  Patient presents with  . Emesis   Patient is a 58 y.o. female presenting with vomiting. The history is provided by the patient (Pt complains of emesis). No language interpreter was used.  Emesis Severity:  Mild Duration:  4 days Timing:  Intermittent Number of daily episodes:  1 Able to tolerate:  Liquids Chronicity:  New Relieved by:  None tried Worsened by:  Nothing tried Ineffective treatments:  None tried Associated symptoms: no abdominal pain, no diarrhea and no headaches    HPI Comments: ULANI DEGRASSE is a 58 y.o. female who presents to the Emergency Department complaining of one episode of emesis after meal 4 days ago. She denies abdominal pain. She states that she had knee surgery 5 days ago and vomited the next day after the meal. She denies being prescribed any nausea medicine.   Past Medical History  Diagnosis Date  . Vertigo   . Hypertension   . Asthma   . Neuropathic pain   . Restless legs syndrome 2007 approx  . Depression   . Chronic back pain   . Stroke   . Spinal headache   . Arthritis of knee, right 08/12/2014   Past Surgical History  Procedure Laterality Date  . Back surgery    . Tonsillectomy    . Cesarean section      x 2  . Shoulder arthroscopy w/ rotator cuff repair      to right shoulder  . Tubal ligation    . Spine surgery  12/2010    ruptd L1 L2 , Dr Carloyn Manner  . Total knee arthroplasty Right 08/12/2014    Procedure: RIGHT TOTAL KNEE ARTHROPLASTY;  Surgeon: Carole Civil, MD;  Location: [facility];  Service: Orthopedics;  Laterality: Right;   History reviewed. No pertinent family history. History  Substance Use Topics  . Smoking status: Former Smoker -- 2.00 packs/day for 2 years    Quit date:  08/06/1984  . Smokeless tobacco: Never Used     Comment: quit in 1984  . Alcohol Use: Yes     Comment: occasionally   OB History    No data available     Review of Systems  Constitutional: Negative for appetite change and fatigue.  HENT: Negative for congestion, ear discharge and sinus pressure.   Eyes: Negative for discharge.  Respiratory: Negative for cough.   Cardiovascular: Negative for chest pain.  Gastrointestinal: Positive for vomiting. Negative for abdominal pain and diarrhea.  Genitourinary: Negative for frequency and hematuria.  Musculoskeletal: Negative for back pain.  Skin: Negative for rash.  Neurological: Negative for seizures and headaches.  Psychiatric/Behavioral: Negative for hallucinations.      Allergies  Other and Influenza vaccines  Home Medications   Prior to Admission medications   Medication Sig Start Date End Date Taking? Authorizing Provider  albuterol (PROVENTIL HFA;VENTOLIN HFA) 108 (90 BASE) MCG/ACT inhaler Inhale 2 puffs into the lungs every 6 (six) hours as needed. Shortness of Breath 10/20/13   Alycia Rossetti, MD  aspirin EC 325 MG EC tablet Take 1 tablet (325 mg total) by mouth 2 (two) times daily. 08/16/14   Carole Civil, MD  ciprofloxacin (CIPRO) 500 MG tablet Take 1 tablet (  500 mg total) by mouth 2 (two) times daily. 08/16/14   Carole Civil, MD  escitalopram (LEXAPRO) 20 MG tablet Take 1 tablet (20 mg total) by mouth daily. 07/06/14   Alycia Rossetti, MD  furosemide (LASIX) 40 MG tablet Take 0.5 tablets (20 mg total) by mouth daily. 08/16/14   Carole Civil, MD  lisinopril (PRINIVIL,ZESTRIL) 10 MG tablet TAKE ONE TABLET BY MOUTH ONCE DAILY 07/06/14   Alycia Rossetti, MD  Melatonin 10 MG TABS Take 1 tablet by mouth at bedtime.    Historical Provider, MD  mometasone (NASONEX) 50 MCG/ACT nasal spray Place 2 sprays into the nose daily.    Historical Provider, MD  montelukast (SINGULAIR) 10 MG tablet TAKE ONE TABLET BY MOUTH ONCE  DAILY AT BEDTIME 07/06/14   Alycia Rossetti, MD  oxyCODONE-acetaminophen (PERCOCET/ROXICET) 5-325 MG per tablet Take 1 tablet by mouth every 4 (four) hours. 08/16/14   Carole Civil, MD  polyethylene glycol [facility] / Floria Raveling) packet Take 17 g by mouth daily. 08/16/14   Carole Civil, MD  pramipexole (MIRAPEX) 0.25 MG tablet Take 0.25 mg by mouth at bedtime.    Historical Provider, MD   BP 130/76 mmHg  Pulse 84  Temp(Src) 100.3 F (37.9 C) (Oral)  Resp 18  Ht 5\' 1"  (1.549 m)  Wt 160 lb (72.576 kg)  BMI 30.25 kg/m2  SpO2 100% Physical Exam  Constitutional: She is oriented to person, place, and time. She appears well-developed.  HENT:  Head: Normocephalic.  Eyes: Conjunctivae and EOM are normal. No scleral icterus.  Neck: Neck supple. No thyromegaly present.  Cardiovascular: Normal rate and regular rhythm.  Exam reveals no gallop and no friction rub.   No murmur heard. Pulmonary/Chest: No stridor. She has no wheezes. She has no rales. She exhibits no tenderness.  Abdominal: She exhibits no distension. There is no tenderness. There is no rebound.  Musculoskeletal: Normal range of motion. She exhibits no edema.  Bandage on right knee  Lymphadenopathy:    She has no cervical adenopathy.  Neurological: She is oriented to person, place, and time. She exhibits normal muscle tone. Coordination normal.  Skin: No rash noted. No erythema.  Psychiatric: She has a normal mood and affect. Her behavior is normal.    ED Course  Procedures (including critical care time) DIAGNOSTIC STUDIES: Oxygen Saturation is 100% on RA, normal by my interpretation.    COORDINATION OF CARE: 7:58 PM- Pt advised of plan for treatment and pt agrees.   Labs Review Labs Reviewed  CBC WITH DIFFERENTIAL  BASIC METABOLIC PANEL    Imaging Review Dg Chest Port 1 View  08/16/2014   CLINICAL DATA:  Fever.  Subsequent encounter.  EXAM: PORTABLE CHEST - 1 VIEW  COMPARISON:  08/15/2014; 10/27/2013;  10/06/2012 grossly unchanged cardiac silhouette and mediastinal contours.  FINDINGS: Examination is again degraded due to patient body habitus and portable technique.  The heart size and mediastinal contours are within normal limits. Both lungs are clear. The visualized skeletal structures are unremarkable. Minimally improved aeration of the lungs with persistent bibasilar opacities, left greater than right, likely atelectasis. No pleural effusion or pneumothorax. No evidence of edema. Unchanged bones.  IMPRESSION: Minimally improved aeration of lungs with persistent bibasilar opacities, left greater than right, atelectasis versus infiltrate. Further evaluation with a PA and lateral chest radiograph may be obtained as clinically indicated.   Electronically Signed   By: Sandi Mariscal M.D.   On: 08/16/2014 08:22  EKG Interpretation None      MDM   Final diagnoses:  None      I personally performed the services described in this documentation, which was scribed in my presence. The recorded information has been reviewed and is accurate.    Maudry Diego, MD 08/17/14 2138

## 2014-08-20 LAB — CULTURE, BLOOD (ROUTINE X 2)
Culture: NO GROWTH
Culture: NO GROWTH

## 2014-08-25 ENCOUNTER — Ambulatory Visit (INDEPENDENT_AMBULATORY_CARE_PROVIDER_SITE_OTHER): Payer: BC Managed Care – PPO | Admitting: Orthopedic Surgery

## 2014-08-25 ENCOUNTER — Encounter: Payer: Self-pay | Admitting: Orthopedic Surgery

## 2014-08-25 VITALS — BP 128/81 | Ht 61.0 in | Wt 160.0 lb

## 2014-08-25 DIAGNOSIS — Z96651 Presence of right artificial knee joint: Secondary | ICD-10-CM

## 2014-08-25 NOTE — Progress Notes (Signed)
Patient ID: Teresa Soto, female   DOB: 1956-07-25, 58 y.o.   MRN: [id number] Chief Complaint  Patient presents with  . Follow-up    post op #1 Right TKA, staple removal, DOS 08/12/14   BP 128/81 mmHg  Ht 5\' 1"  (1.549 m)  Wt 160 lb (72.576 kg)  BMI 30.25 kg/m2  Postop visit right total knee 2 weeks ago doing well no problems knee flexion 95 knee extension full knee incision clean sutures were taken out Supple nontender no signs of DVT pain well controlled with Naprosyn follow-up 4 weeks  Start outpatient PT.

## 2014-08-27 ENCOUNTER — Telehealth: Payer: Self-pay | Admitting: *Deleted

## 2014-08-27 NOTE — Telephone Encounter (Signed)
Pt aware of appt.

## 2014-08-27 NOTE — Telephone Encounter (Signed)
Contacted pt left message on voice mail, pt has appointment on Nov 30 at [facility] on [address] for bone density.Pt is bring her full list of medications at her appiontment

## 2014-09-07 ENCOUNTER — Other Ambulatory Visit (HOSPITAL_COMMUNITY): Payer: BC Managed Care – PPO

## 2014-09-09 ENCOUNTER — Ambulatory Visit (HOSPITAL_COMMUNITY)
Admission: RE | Admit: 2014-09-09 | Discharge: 2014-09-09 | Disposition: A | Discharge status: Home / Self Care | Payer: BC Managed Care – PPO | Source: Ambulatory Visit | Attending: Orthopedic Surgery | Admitting: Orthopedic Surgery

## 2014-09-09 DIAGNOSIS — M25661 Stiffness of right knee, not elsewhere classified: Secondary | ICD-10-CM | POA: Diagnosis not present

## 2014-09-09 DIAGNOSIS — Z471 Aftercare following joint replacement surgery: Secondary | ICD-10-CM | POA: Insufficient documentation

## 2014-09-09 DIAGNOSIS — R29898 Other symptoms and signs involving the musculoskeletal system: Secondary | ICD-10-CM

## 2014-09-09 DIAGNOSIS — Z9889 Other specified postprocedural states: Secondary | ICD-10-CM

## 2014-09-09 DIAGNOSIS — M25561 Pain in right knee: Secondary | ICD-10-CM

## 2014-09-09 DIAGNOSIS — Z96651 Presence of right artificial knee joint: Secondary | ICD-10-CM | POA: Diagnosis not present

## 2014-09-09 NOTE — Patient Instructions (Addendum)
Quad Set   With other leg bent, foot flat, slowly tighten muscles on thigh of straight leg while counting out loud to to 3. . Repeat 10 times. Do 3 sessions per day.  http://gt2.exer.us/276   Copyright  VHI. All rights reserved.   [employer] a large can or rolled towel under leg. Straighten knee and leg. Hold 3 seconds. Repeat with other leg. Repeat 10 times. Do 3 sessions per day.  http://gt2.exer.us/366   Copyright  VHI. All rights reserved.    3 way hamstring stretch

## 2014-09-09 NOTE — Therapy (Signed)
[facility] [address] [place], [place], [address] Phone: [phone]   Fax:  [phone]  Physical Therapy Evaluation  Patient Details  Name: Teresa Soto MRN: [id number] Date of Birth: 1956/09/03  Encounter Date: 09/09/2014      PT End of Session - 09/09/14 1013    Visit Number 1   Number of Visits 18   Date for PT Re-Evaluation 10/09/14   Authorization Type BC[employer]   Authorization Time Period to 11/09/13   Authorization - Visit Number 1   Authorization - Number of Visits 18   PT Start Time 0930   PT Stop Time 1015   PT Time Calculation (min) 45 min   Activity Tolerance Patient tolerated treatment well   Behavior During Therapy [facility] for tasks assessed/performed      Past Medical History  Diagnosis Date  . Vertigo   . Hypertension   . Asthma   . Neuropathic pain   . Restless legs syndrome 2007 approx  . Depression   . Chronic back pain   . Stroke   . Spinal headache   . Arthritis of knee, right 08/12/2014    Past Surgical History  Procedure Laterality Date  . Back surgery    . Tonsillectomy    . Cesarean section      x 2  . Shoulder arthroscopy w/ rotator cuff repair      to right shoulder  . Tubal ligation    . Spine surgery  12/2010    ruptd L1 L2 , Dr Carloyn Manner  . Total knee arthroplasty Right 08/12/2014    Procedure: RIGHT TOTAL KNEE ARTHROPLASTY;  Surgeon: Carole Civil, MD;  Location: [facility];  Service: Orthopedics;  Laterality: Right;    There were no vitals taken for this visit.  Visit Diagnosis:  S/P right knee arthroscopy  Right knee pain  Weakness of right lower extremity  Knee stiffness, right      Subjective Assessment - 09/09/14 0939    Symptoms Rt knee still hurts   Pertinent History Rt knee TKA, on 08/12/14, had home health phsyical therapy. managing pain with medication. no pain in the morning. patient is a cashere    How long can you stand comfortably? 15 to 20 minutes   How long can you walk  comfortably? <38minutes   Patient Stated Goals to be able to walk and stand longer withtou pain. able to stand for 8+ hours.    Currently in Pain? Yes   Pain Score 2    Pain Location Knee   Pain Orientation Right   Pain Descriptors / Indicators Shooting   Pain Type Surgical pain   Pain Onset 1 to 4 weeks ago   Pain Frequency Constant   Aggravating Factors  pain always present, new since surgery, sensitivity to ice.    Pain Relieving Factors sleep and rest, pain medication, cold does not help.    Effect of Pain on Daily Activities stairs up < down, kneeling onto knee.           [facility] PT Assessment - 09/09/14 0001    Assessment   Medical Diagnosis Rt TKA   Onset Date 08/12/14   Next MD Visit [name]   Prior Therapy HHPT 3 weeks   Precautions   Precautions None   Restrictions   Weight Bearing Restrictions No   Balance Screen   Has the patient fallen in the past 6 months No   Has the patient had a decrease in activity  level because of a fear of falling?  No   Is the patient reluctant to leave their home because of a fear of falling?  No   Home Environment   Living Enviornment Private residence   Type of [place] Access Level entry   Home Layout Multi-level   Prior Function   Level of Independence Independent with basic ADLs   Cognition   Overall Cognitive Status Within Functional Limits for tasks assessed   Observation/Other Assessments   Focus on Therapeutic Outcomes (FOTO)  53% limited   Functional Tests   Functional tests Sit to Stand;Other;Other2   AROM   Right Hip External Rotation  30   Right Hip Internal Rotation  30   Right Knee Extension -9   Right Knee Flexion 120   Right Ankle Dorsiflexion 11   Strength   Right Hip Flexion 4/5   Right Hip Extension --  4-/5   Right Knee Flexion 5/5   Right Knee Extension 4/5   Left Knee Flexion 5/5   Left Knee Extension 5/5   Right Ankle Dorsiflexion 5/5          [facility] Adult PT Treatment/Exercise -  09/09/14 0001    Knee/Hip Exercises: Stretches   Active Hamstring Stretch 4 reps;20 seconds   Active Hamstring Stretch Limitations standing to chair   Knee/Hip Exercises: Supine   Quad Sets 10 reps;Right;AROM   Quad Sets Limitations 5 second hld   Short Arc Quad Sets 10 reps;AROM;Right   Short Arc Quad Sets Limitations 5 second hold          PT Education - 09/09/14 1111    Education provided Yes   Education Details Educated on desensitization techniques for Rt LE with rubbing it with hand, towel (cold wet, warm wet, dry) to d3crease sensitivity   Person(s) Educated Patient   Methods Explanation;Demonstration   Comprehension Verbalized understanding;Returned demonstration          PT Short Term Goals - 09/09/14 1118    PT SHORT TERM GOAL #1   Title Patient will be able to extend knee to <5 degrees from 0 to normalize stride   Baseline -9 degrees from full extension   Time 3   Period Weeks   Status New   PT SHORT TERM GOAL #2   Title Patient will be able to flex Rt knee 130 degrees to squat through full depth   Baseline 120 degrees flexion   Time 3   Period Weeks   Status New   PT SHORT TERM GOAL #3   Title Patient will be able to stand and walk >33minutes to do grocery shopping with pain tolerable   Baseline <20 minutes max   Time 3   Period Weeks   Status New          PT Long Term Goals - 09/09/14 1231    PT LONG TERM GOAL #1   Title Patient will be able to fully extend Rt knee to 0 degrees to normalize gait   Baseline -9   Time 6   Period Weeks   Status New   PT LONG TERM GOAL #2   Title Patient will be abel to demonstrate Rt knee extension strength of 5/5 to be able to ambulate up and down stairs with out hand held assist   Baseline 4/5 MMT   Time 6   Period Weeks   Status New   PT LONG TERM GOAL #3   Title Patient will demonstrate  bilateral Glute strength of 4/5 MMT to be abel to stand up from the floor   Baseline 4-/5   Time 6   Period Weeks   PT  LONG TERM GOAL #4   Title Patient will demonstrate decreased Rt knee pain so she can stand for 8 hours to return to work.    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 Patient will benefit from skilled phsyical therapy to increase knee extesnion AROM to normalize gait and progress knee flexion to improve  squat mechanics so patient can return to work.  Patient noted increased sensitivity of Rt EL above and below scarline and noted improved sensitivity with touch. Patient was instructed in desensitization activities to decrease pain so she can sleep with blankets on her Right leg without pain.    Pt will benefit from skilled therapeutic intervention in order to improve on the following deficits Abnormal gait;Decreased strength;Pain;Difficulty walking;Decreased activity tolerance;Decreased balance;Decreased range of motion;Increased muscle spasms;Improper body mechanics;Decreased endurance;Impaired flexibility   Rehab Potential Good   PT Frequency 3x / week   PT Duration 6 weeks   PT Treatment/Interventions ADLs/Self Care Home Management;Therapeutic exercise;Balance training;Passive range of motion;Neuromuscular re-education;Gait training;Patient/family education;Stair training;Manual techniques;Functional mobility training;Therapeutic activities   PT Next Visit Plan Next session educate patient on LE stretches including: 3 way hamstring, 3 way hip flexor, 3 way calf stretches as well as quad stretch. Therapy to focus on progressing Rt knee extension to normalize stride.    PT Home Exercise Plan Short arc quad, quad set and hamstring stretch   Consulted and Agree with Plan of Care Patient          Problem List Patient Active  Problem List   Diagnosis Date Noted  . Postoperative pulmonary edema 08/15/2014  . 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 Primary osteoarthritis of right knee 07/27/2014  . OA (osteoarthritis) of knee 06/16/2014  . Idiopathic parathyroidism 06/16/2014  . Tinea versicolor 06/16/2014  . Obesity, unspecified 04/06/2013  . Lack of coordination 10/21/2012  . CVA (cerebral infarction) 10/10/2012  . Vitamin d deficiency 11/10/2011  . Hearing loss 06/29/2011  . Carotid bruit 06/29/2011  . BACK PAIN, LUMBAR, WITH RADICULOPATHY 06/28/2010  . SCIATICA, LEFT 05/11/2010  . DEPRESSION 04/04/2010  . PERIPHERAL EDEMA 04/08/2009  . DEGENERATIVE JOINT DISEASE 03/23/2008  . HYPERLIPIDEMIA 11/18/2007  . INSOMNIA 10/17/2007  . ASTHMA 11/30/2006  . HYPERTENSION 09/05/2006  . ARTHRITIS 09/05/2006  . VERTIGO 09/05/2006    Devona Konig PT DPT [phone]

## 2014-09-11 ENCOUNTER — Ambulatory Visit (HOSPITAL_COMMUNITY)
Admission: RE | Admit: 2014-09-11 | Discharge: 2014-09-11 | Disposition: A | Discharge status: Home / Self Care | Payer: BC Managed Care – PPO | Source: Ambulatory Visit | Attending: Orthopedic Surgery | Admitting: Orthopedic Surgery

## 2014-09-11 DIAGNOSIS — Z471 Aftercare following joint replacement surgery: Secondary | ICD-10-CM | POA: Diagnosis not present

## 2014-09-11 DIAGNOSIS — M25561 Pain in right knee: Secondary | ICD-10-CM

## 2014-09-11 DIAGNOSIS — Z9889 Other specified postprocedural states: Secondary | ICD-10-CM

## 2014-09-11 DIAGNOSIS — M25661 Stiffness of right knee, not elsewhere classified: Secondary | ICD-10-CM

## 2014-09-11 DIAGNOSIS — R29898 Other symptoms and signs involving the musculoskeletal system: Secondary | ICD-10-CM

## 2014-09-11 NOTE — Therapy (Signed)
[facility] [address] [place], [place], [address] Phone: [phone]   Fax:  [phone]  Physical Therapy Treatment  Patient Details  Name: Teresa Soto MRN: [id number] Date of Birth: 1956-06-21  Encounter Date: 09/11/2014      PT End of Session - 09/11/14 1011    Visit Number 2   Number of Visits 18   Date for PT Re-Evaluation 10/09/14   Authorization Type BC[employer]   Authorization Time Period to 11/09/13   Authorization - Visit Number 2   Authorization - Number of Visits 18   PT Start Time 0933   PT Stop Time 1013   PT Time Calculation (min) 40 min   Activity Tolerance Patient tolerated treatment well   Behavior During Therapy [facility] for tasks assessed/performed      Past Medical History  Diagnosis Date  . Vertigo   . Hypertension   . Asthma   . Neuropathic pain   . Restless legs syndrome 2007 approx  . Depression   . Chronic back pain   . Stroke   . Spinal headache   . Arthritis of knee, right 08/12/2014    Past Surgical History  Procedure Laterality Date  . Back surgery    . Tonsillectomy    . Cesarean section      x 2  . Shoulder arthroscopy w/ rotator cuff repair      to right shoulder  . Tubal ligation    . Spine surgery  12/2010    ruptd L1 L2 , Dr Carloyn Manner  . Total knee arthroplasty Right 08/12/2014    Procedure: RIGHT TOTAL KNEE ARTHROPLASTY;  Surgeon: Carole Civil, MD;  Location: [facility];  Service: Orthopedics;  Laterality: Right;    There were no vitals taken for this visit.  Visit Diagnosis:  S/P right knee arthroscopy  Right knee pain  Weakness of right lower extremity  Knee stiffness, right      Subjective Assessment - 09/11/14 0951    Symptoms Pt reports mild complaints of pain at 3/10 in the Rt knee.  Pt reports soreness after evaluation for ~24 hours after session.     Currently in Pain? Yes   Pain Score 3    Pain Location Knee   Pain Orientation Right   Pain Type Surgical pain          [facility]  PT Assessment - 09/11/14 0954    Assessment   Medical Diagnosis Rt TKA   Onset Date 08/12/14   Next MD Visit Jobe Marker Adult PT Treatment/Exercise - 09/11/14 0954    Exercises   Exercises Knee/Hip   Knee/Hip Exercises: Stretches   Active Hamstring Stretch 3 reps;20 seconds   Active Hamstring Stretch Limitations 3 Way on 14" box   Quad Stretch 3 reps;30 seconds   Quad Stretch Limitations Prone with rope   Gastroc Stretch 3 reps;30 seconds   Gastroc Stretch Limitations Slantboard   Knee/Hip Exercises: Standing   Heel Raises 10 reps  No UE assist   Heel Raises Limitations Toe Raises x10, 1 HHA   Functional Squat 10 reps   Knee/Hip Exercises: Supine   Short Arc Quad Sets 10 reps;AROM;Right   Straight Leg Raises 10 reps;Right   Knee/Hip Exercises: Sidelying   Hip ABduction 10 reps;Right   Knee/Hip Exercises: Prone   Hamstring Curl 2 sets;10 reps   Hamstring Curl Limitations 2#, Rt side   Hip Extension 10 reps;Right  PT Short Term Goals - 09/11/14 1017    PT SHORT TERM GOAL #1   Title Patient will be able to extend knee to <5 degrees from 0 to normalize stride   Status On-going   PT SHORT TERM GOAL #2   Title Patient will be able to flex Rt knee 130 degrees to squat through full depth   Status On-going   PT SHORT TERM GOAL #3   Title Patient will be able to stand and walk >26minutes to do grocery shopping with pain tolerable   Status On-going          PT Long Term Goals - 09/11/14 1017    PT LONG TERM GOAL #2   Title Patient will be abel to demonstrate Rt knee extension strength of 5/5 to be able to ambulate up and down stairs with out hand held assist   Status On-going   PT LONG TERM GOAL #3   Title Patient will demonstrate bilateral Glute strength of 4/5 MMT to be abel to stand up from the floor   Status On-going          Plan - 09/11/14 1012    Clinical Impression Statement PT POC initiated, working on ROM and strengthening for  the Rt LE.  Focused exercises on progressing knee extension, and maintaining knee extension during SLR exercises.  Added mini squats for CKC strengthening, with tactile cueing for technique to avoid putting pressure on (B) knees.  No increases in pain reported by end of session, only muscle soreness.    Pt will benefit from skilled therapeutic intervention in order to improve on the following deficits Abnormal gait;Decreased strength;Pain;Difficulty walking;Decreased activity tolerance;Decreased balance;Decreased range of motion;Increased muscle spasms;Improper body mechanics;Decreased endurance;Impaired flexibility   Rehab Potential Good   PT Frequency 3x / week   PT Duration 6 weeks   PT Next Visit Plan Add 3 way hip flexor and 3 way calf stretch.  Progress reps of strengthening program, adding step ups to 4" box.           Problem List Patient Active Problem List   Diagnosis Date Noted  . Postoperative pulmonary edema 08/15/2014  . 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 Primary osteoarthritis of right knee 07/27/2014  . OA (osteoarthritis) of knee 06/16/2014  . Idiopathic parathyroidism 06/16/2014  . Tinea versicolor 06/16/2014  . Obesity, unspecified 04/06/2013  . Lack of coordination 10/21/2012  . CVA (cerebral infarction) 10/10/2012  . Vitamin d deficiency 11/10/2011  . Hearing loss 06/29/2011  . Carotid bruit 06/29/2011  . BACK PAIN, LUMBAR, WITH RADICULOPATHY 06/28/2010  . SCIATICA, LEFT 05/11/2010  . DEPRESSION 04/04/2010  . PERIPHERAL EDEMA 04/08/2009  . DEGENERATIVE JOINT DISEASE 03/23/2008  . HYPERLIPIDEMIA 11/18/2007  . INSOMNIA 10/17/2007  . ASTHMA 11/30/2006  . HYPERTENSION 09/05/2006  . ARTHRITIS 09/05/2006  . VERTIGO 09/05/2006   Lonna Cobb, DPT [phone]

## 2014-09-14 ENCOUNTER — Ambulatory Visit (HOSPITAL_COMMUNITY)
Admission: RE | Admit: 2014-09-14 | Discharge: 2014-09-14 | Disposition: A | Discharge status: Home / Self Care | Payer: BC Managed Care – PPO | Source: Ambulatory Visit | Attending: Orthopedic Surgery | Admitting: Orthopedic Surgery

## 2014-09-14 DIAGNOSIS — R29898 Other symptoms and signs involving the musculoskeletal system: Secondary | ICD-10-CM

## 2014-09-14 DIAGNOSIS — M25661 Stiffness of right knee, not elsewhere classified: Secondary | ICD-10-CM

## 2014-09-14 DIAGNOSIS — Z9889 Other specified postprocedural states: Secondary | ICD-10-CM

## 2014-09-14 DIAGNOSIS — Z471 Aftercare following joint replacement surgery: Secondary | ICD-10-CM | POA: Diagnosis not present

## 2014-09-14 DIAGNOSIS — M25561 Pain in right knee: Secondary | ICD-10-CM

## 2014-09-14 NOTE — Therapy (Signed)
[facility] [address] [place], [place], [address] Phone: [phone]   Fax:  [phone]  Physical Therapy Treatment  Patient Details  Name: Teresa Soto MRN: [id number] Date of Birth: Dec 09, 1955  Encounter Date: 09/14/2014      PT End of Session - 09/14/14 1227    Visit Number 3   Number of Visits 18   Date for PT Re-Evaluation 10/09/14   Authorization Type [employer]   Authorization Time Period to 11/09/13   Authorization - Visit Number 3   Authorization - Number of Visits 18   PT Start Time 6553   PT Stop Time 1230   PT Time Calculation (min) 41 min   Activity Tolerance Patient tolerated treatment well   Behavior During Therapy [facility] for tasks assessed/performed      Past Medical History  Diagnosis Date  . Vertigo   . Hypertension   . Asthma   . Neuropathic pain   . Restless legs syndrome 2007 approx  . Depression   . Chronic back pain   . Stroke   . Spinal headache   . Arthritis of knee, right 08/12/2014    Past Surgical History  Procedure Laterality Date  . Back surgery    . Tonsillectomy    . Cesarean section      x 2  . Shoulder arthroscopy w/ rotator cuff repair      to right shoulder  . Tubal ligation    . Spine surgery  12/2010    ruptd L1 L2 , Dr Carloyn Manner  . Total knee arthroplasty Right 08/12/2014    Procedure: RIGHT TOTAL KNEE ARTHROPLASTY;  Surgeon: Carole Civil, MD;  Location: [facility];  Service: Orthopedics;  Laterality: Right;    There were no vitals taken for this visit.  Visit Diagnosis:  S/P right knee arthroscopy  Right knee pain  Weakness of right lower extremity  Knee stiffness, right      Subjective Assessment - 09/14/14 1153    Symptoms Pt reports mild complaints of pain at 3/10 in the Rt knee.  Pt reports some unsteadiness in the Rt knee.      Currently in Pain? Yes   Pain Score 3           [facility] PT Assessment - 09/14/14 1152    Assessment   Medical Diagnosis Rt TKA   Onset Date 08/12/14    Next MD Visit [name] 09/22/14          [facility] Adult PT Treatment/Exercise - 09/14/14 1149    Exercises   Exercises Knee/Hip   Knee/Hip Exercises: Stretches   Active Hamstring Stretch 3 reps;20 seconds   Active Hamstring Stretch Limitations 3 Way on 14" box   Quad Stretch 3 reps;30 seconds   Quad Stretch Limitations Prone with rope   Gastroc Stretch 3 reps;20 seconds   Gastroc Stretch Limitations 3 Way, Slantboard   Knee/Hip Exercises: Standing   Heel Raises 15 reps   Heel Raises Limitations Toe Raises x15, 1 HHA   Lateral Step Up 10 reps;Hand Hold: 0;Step Height: 4";Right   Forward Step Up 10 reps;Step Height: 4";Hand Hold: 0;Right   Functional Squat 10 reps   Knee/Hip Exercises: Supine   Straight Leg Raises 2 sets;10 reps;Right   Knee/Hip Exercises: Sidelying   Hip ABduction 2 sets;10 reps;Right   Knee/Hip Exercises: Prone   Hamstring Curl 2 sets;15 reps   Hamstring Curl Limitations 2#, Rt side   Hip Extension 2 sets;10 reps;Right  PT Short Term Goals - 09/14/14 1249    PT SHORT TERM GOAL #1   Title Patient will be able to extend knee to <5 degrees from 0 to normalize stride   Status On-going   PT SHORT TERM GOAL #2   Title Patient will be able to flex Rt knee 130 degrees to squat through full depth   Status On-going   PT SHORT TERM GOAL #3   Title Patient will be able to stand and walk >28minutes to do grocery shopping with pain tolerable   Status On-going          PT Long Term Goals - 09/14/14 1249    PT LONG TERM GOAL #1   Title Patient will be able to fully extend Rt knee to 0 degrees to normalize gait   PT LONG TERM GOAL #2   Title Patient will be able to demonstrate Rt knee extension strength of 5/5 to be able to ambulate up and down stairs with out hand held assist   Status On-going   PT LONG TERM GOAL #3   Title Patient will demonstrate bilateral Glute strength of 4/5 MMT to be able to stand up from the floor   Status On-going   PT  LONG TERM GOAL #4   Title Patient will demonstrate decreased Rt knee pain so she can stand for 8 hours to return to work.    Status On-going          Plan - 09/14/14 1228    Clinical Impression Statement Progressed strengthening program increasing reps of mat exercises to tolerance.  Added step ups forward and lateral to increase CKC strength of the Rt LE; VC for technique and controlled descent.  Educated pt on avoiding knee dropping in when descending stairs, by use of medial knee/VMO strength, though pt reports she has stepped like that all her life and reports medial rotation of tibia causes this compensation.  Mild increases in knee soreness by end of treatent session, with pt reporting she will ice at home.     Pt will benefit from skilled therapeutic intervention in order to improve on the following deficits Abnormal gait;Decreased strength;Pain;Difficulty walking;Decreased activity tolerance;Decreased balance;Decreased range of motion;Increased muscle spasms;Improper body mechanics;Decreased endurance;Impaired flexibility   Rehab Potential Good   PT Frequency 3x / week   PT Duration 6 weeks   PT Treatment/Interventions ADLs/Self Care Home Management;Therapeutic exercise;Balance training;Passive range of motion;Neuromuscular re-education;Gait training;Patient/family education;Stair training;Manual techniques;Functional mobility training;Therapeutic activities   PT Next Visit Plan Add 3 way hip flexor stretch.  Progress HS strengthening to standing, and increase reps of steps as tolerated.            Problem List Patient Active Problem List   Diagnosis Date Noted  . Postoperative pulmonary edema 08/15/2014  . 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 Primary osteoarthritis of right knee 07/27/2014  . OA (osteoarthritis) of knee 06/16/2014  . Idiopathic parathyroidism 06/16/2014  . Tinea versicolor 06/16/2014  . Obesity, unspecified 04/06/2013  . Lack of coordination 10/21/2012   . CVA (cerebral infarction) 10/10/2012  . Vitamin d deficiency 11/10/2011  . Hearing loss 06/29/2011  . Carotid bruit 06/29/2011  . BACK PAIN, LUMBAR, WITH RADICULOPATHY 06/28/2010  . SCIATICA, LEFT 05/11/2010  . DEPRESSION 04/04/2010  . PERIPHERAL EDEMA 04/08/2009  . DEGENERATIVE JOINT DISEASE 03/23/2008  . HYPERLIPIDEMIA 11/18/2007  . INSOMNIA 10/17/2007  . ASTHMA 11/30/2006  . HYPERTENSION 09/05/2006  . ARTHRITIS 09/05/2006  . VERTIGO 09/05/2006   Lonna Cobb, DPT  [phone]      

## 2014-09-16 ENCOUNTER — Other Ambulatory Visit: Payer: Self-pay | Admitting: Family Medicine

## 2014-09-16 ENCOUNTER — Encounter (HOSPITAL_COMMUNITY): Payer: Self-pay

## 2014-09-16 ENCOUNTER — Ambulatory Visit (HOSPITAL_COMMUNITY)
Admission: RE | Admit: 2014-09-16 | Discharge: 2014-09-16 | Disposition: A | Discharge status: Home / Self Care | Payer: BC Managed Care – PPO | Source: Ambulatory Visit | Attending: Orthopedic Surgery | Admitting: Orthopedic Surgery

## 2014-09-16 DIAGNOSIS — M25661 Stiffness of right knee, not elsewhere classified: Secondary | ICD-10-CM

## 2014-09-16 DIAGNOSIS — R29898 Other symptoms and signs involving the musculoskeletal system: Secondary | ICD-10-CM

## 2014-09-16 DIAGNOSIS — M25561 Pain in right knee: Secondary | ICD-10-CM

## 2014-09-16 DIAGNOSIS — Z471 Aftercare following joint replacement surgery: Secondary | ICD-10-CM | POA: Diagnosis not present

## 2014-09-16 DIAGNOSIS — R279 Unspecified lack of coordination: Secondary | ICD-10-CM

## 2014-09-16 NOTE — Patient Instructions (Signed)
Pt instructed and given green theraband for standing terminal knee extension

## 2014-09-16 NOTE — Therapy (Signed)
[facility] [place], [place], [address] Phone: [phone]   Fax:  [phone]  Physical Therapy Treatment  Patient Details  Name: Teresa Soto MRN: [id number] Date of Birth: 08/26/1956  Encounter Date: 09/16/2014      PT End of Session - 09/16/14 1211    Visit Number 4   Number of Visits 18   Date for PT Re-Evaluation 10/09/14   Authorization Type BCBS   Authorization Time Period to 11/09/13   Authorization - Visit Number 4   Authorization - Number of Visits 18   PT Start Time 3664   PT Stop Time 1240   PT Time Calculation (min) 42 min   Activity Tolerance Patient tolerated treatment well   Behavior During Therapy [facility] for tasks assessed/performed      Past Medical History  Diagnosis Date  . Vertigo   . Hypertension   . Asthma   . Neuropathic pain   . Restless legs syndrome 2007 approx  . Depression   . Chronic back pain   . Stroke   . Spinal headache   . Arthritis of knee, right 08/12/2014    Past Surgical History  Procedure Laterality Date  . Back surgery    . Tonsillectomy    . Cesarean section      x 2  . Shoulder arthroscopy w/ rotator cuff repair      to right shoulder  . Tubal ligation    . Spine surgery  12/2010    ruptd L1 L2 , Dr Carloyn Manner  . Total knee arthroplasty Right 08/12/2014    Procedure: RIGHT TOTAL KNEE ARTHROPLASTY;  Surgeon: Carole Civil, MD;  Location: [facility];  Service: Orthopedics;  Laterality: Right;    There were no vitals taken for this visit.  Visit Diagnosis:  Right knee pain  Weakness of right lower extremity  Knee stiffness, right  Lack of coordination      Subjective Assessment - 09/16/14 1204    Symptoms Pt stated she is currently pain free c/o knee swelling and constant acheing.   Currently in Pain? No/denies   Pain Descriptors / Indicators Aching;Pins and needles          [facility] PT Assessment - 09/16/14 0001    Assessment   Medical Diagnosis Rt TKA   Onset Date  08/12/14   Next MD Visit [name] 09/22/14   Prior Therapy HHPT 3 weeks   AROM   Right Knee Extension -6          [facility] Adult PT Treatment/Exercise - 09/16/14 0001    Exercises   Exercises Knee/Hip   Knee/Hip Exercises: Stretches   Active Hamstring Stretch 3 reps;30 seconds   Active Hamstring Stretch Limitations 3 Way on 14" box   Quad Stretch 3 reps;30 seconds   Quad Stretch Limitations Prone with rope   Hip Flexor Stretch 3 reps;30 seconds   Hip Flexor Stretch Limitations 3 way on 14 in   Gastroc Stretch 3 reps;30 seconds   Gastroc Stretch Limitations slant board   Knee/Hip Exercises: Standing   Heel Raises 15 reps   Heel Raises Limitations Toe Raises x15, 1 HHA   Knee Flexion Right;2 sets;10 reps   Knee Flexion Limitations 3#   Terminal Knee Extension Right;10 reps;Theraband   Theraband Level (Terminal Knee Extension) Level 3 (Green)   Lateral Step Up Right;10 reps;Hand Hold: 1;Step Height: 4"   Forward Step Up Right;Hand Hold: 1;Step Height: 4";15 reps   Functional Squat 15 reps  Knee/Hip Exercises: Supine   Quad Sets Right;10 reps   Quad Sets Limitations 5 second holds   Terminal Knee Extension Right;10 reps          PT Education - 09/16/14 1309    Education provided Yes   Education Details Techniques to assist with swelling and importance of knee extension for proper gait mechancis.   Methods Explanation;Demonstration   Comprehension Verbalized understanding;Returned demonstration          PT Short Term Goals - 09/16/14 1220    PT SHORT TERM GOAL #1   Title Patient will be able to extend knee to <5 degrees from 0 to normalize stride   Status On-going   PT SHORT TERM GOAL #2   Title Patient will be able to flex Rt knee 130 degrees to squat through full depth   Status On-going   PT SHORT TERM GOAL #3   Title Patient will be able to stand and walk >78minutes to do grocery shopping with pain tolerable   Status On-going          PT Long Term Goals -  09/16/14 1343    PT LONG TERM GOAL #1   Title Patient will be able to fully extend Rt knee to 0 degrees to normalize gait   PT LONG TERM GOAL #2   Title Patient will be able to demonstrate Rt knee extension strength of 5/5 to be able to ambulate up and down stairs with out hand held assist   Status On-going   PT LONG TERM GOAL #3   Title Patient will demonstrate bilateral Glute strength of 4/5 MMT to be able to stand up from the floor   Status On-going   PT LONG TERM GOAL #4   Title Patient will demonstrate decreased Rt knee pain so she can stand for 8 hours to return to work.           Plan - 09/16/14 1331    Clinical Impression Statement Added 3 way hip flexion to improve hip mobilyt.  Progressed strengthening exercises with standing hamstirng curls and stair training.  Pt educated on importance of terminal knee extensin, added TKE supine and standing exercises for quad strengthening to imporve extension.  Pt improved extension to lacking 6 degrees extension today.  Pt given green theraband to add standing TKE to HEP.  Pt educated on techniques to assist with edema control.   PT Next Visit Plan Progress note prior MD apt.  Progress strength as tolerated.and improvie ROM.        Problem List Patient Active Problem List   Diagnosis Date Noted  . Postoperative pulmonary edema 08/15/2014  . 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 Primary osteoarthritis of right knee 07/27/2014  . OA (osteoarthritis) of knee 06/16/2014  . Idiopathic parathyroidism 06/16/2014  . Tinea versicolor 06/16/2014  . Obesity, unspecified 04/06/2013  . Lack of coordination 10/21/2012  . CVA (cerebral infarction) 10/10/2012  . Vitamin d deficiency 11/10/2011  . Hearing loss 06/29/2011  . Carotid bruit 06/29/2011  . BACK PAIN, LUMBAR, WITH RADICULOPATHY 06/28/2010  . SCIATICA, LEFT 05/11/2010  . DEPRESSION 04/04/2010  . PERIPHERAL EDEMA 04/08/2009  . DEGENERATIVE JOINT DISEASE 03/23/2008  . HYPERLIPIDEMIA  11/18/2007  . INSOMNIA 10/17/2007  . ASTHMA 11/30/2006  . HYPERTENSION 09/05/2006  . ARTHRITIS 09/05/2006  . VERTIGO 09/05/2006   Ihor Austin, [place] Aldona Lento 09/16/2014, 1:44 PM

## 2014-09-17 NOTE — Telephone Encounter (Signed)
Refill appropriate and filled per protocol. 

## 2014-09-18 ENCOUNTER — Ambulatory Visit (HOSPITAL_COMMUNITY)
Admission: RE | Admit: 2014-09-18 | Discharge: 2014-09-18 | Disposition: A | Discharge status: Home / Self Care | Payer: BC Managed Care – PPO | Source: Ambulatory Visit | Attending: Orthopedic Surgery | Admitting: Orthopedic Surgery

## 2014-09-18 DIAGNOSIS — Z9889 Other specified postprocedural states: Secondary | ICD-10-CM

## 2014-09-18 DIAGNOSIS — Z471 Aftercare following joint replacement surgery: Secondary | ICD-10-CM | POA: Diagnosis not present

## 2014-09-18 DIAGNOSIS — R29898 Other symptoms and signs involving the musculoskeletal system: Secondary | ICD-10-CM

## 2014-09-18 DIAGNOSIS — M25561 Pain in right knee: Secondary | ICD-10-CM

## 2014-09-18 DIAGNOSIS — M25661 Stiffness of right knee, not elsewhere classified: Secondary | ICD-10-CM

## 2014-09-18 NOTE — Therapy (Signed)
[facility] [address] [place], [place], [address] Phone: [phone]   Fax:  [phone]  Physical Therapy Treatment  Patient Details  Name: Teresa Soto MRN: [id number] Date of Birth: 11-Jan-1956  Encounter Date: 09/18/2014      PT End of Session - 09/18/14 1153    Visit Number 5   Number of Visits 18   Date for PT Re-Evaluation 10/09/14   Authorization Type BCBS   Authorization Time Period to 11/09/13   Authorization - Visit Number 5   Authorization - Number of Visits 18   PT Start Time 1100   PT Stop Time 1154   PT Time Calculation (min) 54 min   Activity Tolerance Patient tolerated treatment well   Behavior During Therapy [facility] for tasks assessed/performed      Past Medical History  Diagnosis Date  . Vertigo   . Hypertension   . Asthma   . Neuropathic pain   . Restless legs syndrome 2007 approx  . Depression   . Chronic back pain   . Stroke   . Spinal headache   . Arthritis of knee, right 08/12/2014    Past Surgical History  Procedure Laterality Date  . Back surgery    . Tonsillectomy    . Cesarean section      x 2  . Shoulder arthroscopy w/ rotator cuff repair      to right shoulder  . Tubal ligation    . Spine surgery  12/2010    ruptd L1 L2 , Dr Carloyn Manner  . Total knee arthroplasty Right 08/12/2014    Procedure: RIGHT TOTAL KNEE ARTHROPLASTY;  Surgeon: Carole Civil, MD;  Location: [facility];  Service: Orthopedics;  Laterality: Right;    There were no vitals taken for this visit.  Visit Diagnosis:  Right knee pain  Weakness of right lower extremity  Knee stiffness, right  S/P right knee arthroscopy      Subjective Assessment - 09/18/14 1115    Symptoms Pt reports mild complaints of pain today.  Pt reports she was able to walk the dogs yesterday, with some increases in swelling which decreased by that evening.  Pt reports during her walk she was working on getting full extension on her Rt knee.           [facility]  PT Assessment - 09/18/14 1104    Assessment   Medical Diagnosis Rt TKA   Onset Date 08/12/14   Next MD Visit [name] 09/22/14   Prior Therapy HHPT 3 weeks   AROM   Right Knee Extension -6  was -9   Right Knee Flexion 125  was 120   Flexibility   Soft Tissue Assessment /Muscle Lenght yes   Hamstrings 90/90 Test Rt 90 degrees, Lt 84 degrees (-6)   Quadriceps Rt 6 1/2in, Lt to glute   Balance   Balance Assessed Yes   Static Standing Balance   Static Standing - Comment/# of Minutes Rt 25.36, Lt 23.02          [facility] Adult PT Treatment/Exercise - 09/18/14 1103    Exercises   Exercises Knee/Hip   Knee/Hip Exercises: Stretches   Active Hamstring Stretch 3 reps;20 seconds   Active Hamstring Stretch Limitations 3 Way on 14" box   Quad Stretch 3 reps;30 seconds   Quad Stretch Limitations Prone with rope   Hip Flexor Stretch 3 reps;30 seconds   Hip Flexor Stretch Limitations 3 way on 14 in   Gastroc Stretch 3  reps;30 seconds   Gastroc Stretch Limitations slant board   Knee/Hip Exercises: Standing   Heel Raises 20 reps  no UE   Heel Raises Limitations Toe Raises x20, 1 HHA   Knee Flexion 2 sets;15 reps;Right   Knee Flexion Limitations 3#   Forward Lunges 10 reps;Right   Terminal Knee Extension 20 reps;Right   Theraband Level (Terminal Knee Extension) Level 3 (Green)   Lateral Step Up 10 reps;Hand Hold: 0;Step Height: 6";Right   Forward Step Up 10 reps;Hand Hold: 0;Step Height: 6";Right   Step Down 10 reps;Hand Hold: 0;Step Height: 4";Right   Functional Squat 20 reps   SLS Rt 25.36, Lt 23.02   Other Standing Knee Exercises Monster Walk RTB 30' RT   Knee/Hip Exercises: Supine   Terminal Knee Extension 15 reps;Right   Theraband Level (Terminal Knee Extension) Level 3 Nyoka Cowden)          PT Education - 09/18/14 1139    Education provided Yes   Education Details Scar Massage   Person(s) Educated Patient   Methods Explanation;Demonstration   Comprehension Verbalized  understanding;Returned demonstration          PT Short Term Goals - 09/18/14 1200    PT SHORT TERM GOAL #1   Title Patient will be able to extend knee to <5 degrees from 0 to normalize stride   Baseline 09/18/14: -6 degrees    Status On-going   PT SHORT TERM GOAL #2   Title Patient will be able to flex Rt knee 130 degrees to squat through full depth   Baseline 09/18/14: 125 degrees   PT SHORT TERM GOAL #3   Title Patient will be able to stand and walk >30minutes to do grocery shopping with pain tolerable   Baseline 09/18/14: Pt was able to walk dogs for ~20 minutes.    Status On-going            Plan - 09/18/14 1156    Clinical Impression Statement Measurements updated for ROM, flexibility, and single leg balance for MD visit next Tuesday.   Pt demonstrates improved ROM both into flexion and extension from initial evaluation, and educated pt to continue to focus on HEP for improving flexibility and ROM particularly into knee extension.   Pt reports limited complaints of pain during therapeutic exercise and with walking program at home, though pt does report swelling increases with activity.  Educated pt to add scar massage to decrease adhestions, and continue with ice/elevation/ankle pumps/quad set to decrease swelling after activity.     Pt will benefit from skilled therapeutic intervention in order to improve on the following deficits Abnormal gait;Decreased strength;Pain;Difficulty walking;Decreased activity tolerance;Decreased balance;Decreased range of motion;Increased muscle spasms;Improper body mechanics;Decreased endurance;Impaired flexibility   Rehab Potential Good   PT Frequency 3x / week   PT Duration 6 weeks   PT Next Visit Plan Continue working on improving knee ROM, knee extension > flexion, and strengthening program with good technique.        Problem List Patient Active Problem List   Diagnosis Date Noted  . Postoperative pulmonary edema 08/15/2014  . Arthritis  of knee, right 08/12/2014  . Primary osteoarthritis of right knee 07/27/2014  . OA (osteoarthritis) of knee 06/16/2014  . Idiopathic parathyroidism 06/16/2014  . Tinea versicolor 06/16/2014  . Obesity, unspecified 04/06/2013  . Lack of coordination 10/21/2012  . CVA (cerebral infarction) 10/10/2012  . Vitamin d deficiency 11/10/2011  . Hearing loss 06/29/2011  . Carotid bruit 06/29/2011  . BACK PAIN, LUMBAR,  WITH RADICULOPATHY 06/28/2010  . SCIATICA, LEFT 05/11/2010  . DEPRESSION 04/04/2010  . PERIPHERAL EDEMA 04/08/2009  . DEGENERATIVE JOINT DISEASE 03/23/2008  . HYPERLIPIDEMIA 11/18/2007  . INSOMNIA 10/17/2007  . ASTHMA 11/30/2006  . HYPERTENSION 09/05/2006  . ARTHRITIS 09/05/2006  . VERTIGO 09/05/2006   Lonna Cobb, DPT [phone]

## 2014-09-22 ENCOUNTER — Ambulatory Visit: Payer: BC Managed Care – PPO | Admitting: Orthopedic Surgery

## 2014-09-22 ENCOUNTER — Encounter: Payer: Self-pay | Admitting: Orthopedic Surgery

## 2014-09-23 ENCOUNTER — Ambulatory Visit (HOSPITAL_COMMUNITY)
Admission: RE | Admit: 2014-09-23 | Discharge: 2014-09-23 | Disposition: A | Discharge status: Home / Self Care | Payer: BC Managed Care – PPO | Source: Ambulatory Visit | Attending: Orthopedic Surgery | Admitting: Orthopedic Surgery

## 2014-09-23 DIAGNOSIS — Z9889 Other specified postprocedural states: Secondary | ICD-10-CM

## 2014-09-23 DIAGNOSIS — M25661 Stiffness of right knee, not elsewhere classified: Secondary | ICD-10-CM

## 2014-09-23 DIAGNOSIS — R29898 Other symptoms and signs involving the musculoskeletal system: Secondary | ICD-10-CM

## 2014-09-23 DIAGNOSIS — R279 Unspecified lack of coordination: Secondary | ICD-10-CM

## 2014-09-23 DIAGNOSIS — M25561 Pain in right knee: Secondary | ICD-10-CM

## 2014-09-23 DIAGNOSIS — Z471 Aftercare following joint replacement surgery: Secondary | ICD-10-CM | POA: Diagnosis not present

## 2014-09-23 NOTE — Therapy (Signed)
[facility] [address] [name], [place], [address] Phone: [phone]   Fax:  [phone]  Physical Therapy Treatment  Patient Details  Name: Teresa Soto MRN: [id number] Date of Birth: 03-Nov-1955  Encounter Date: 09/23/2014      PT End of Session - 09/23/14 1711    Visit Number 6   Number of Visits 18   Date for PT Re-Evaluation 10/09/14   Authorization Type BCBS   Authorization Time Period to 11/09/13   Authorization - Visit Number 6   Authorization - Number of Visits 18   PT Start Time 6237   PT Stop Time 1730   PT Time Calculation (min) 45 min   Activity Tolerance Patient tolerated treatment well   Behavior During Therapy [facility] for tasks assessed/performed      Past Medical History  Diagnosis Date  . Vertigo   . Hypertension   . Asthma   . Neuropathic pain   . Restless legs syndrome 2007 approx  . Depression   . Chronic back pain   . Stroke   . Spinal headache   . Arthritis of knee, right 08/12/2014    Past Surgical History  Procedure Laterality Date  . Back surgery    . Tonsillectomy    . Cesarean section      x 2  . Shoulder arthroscopy w/ rotator cuff repair      to right shoulder  . Tubal ligation    . Spine surgery  12/2010    ruptd L1 L2 , Dr Carloyn Manner  . Total knee arthroplasty Right 08/12/2014    Procedure: RIGHT TOTAL KNEE ARTHROPLASTY;  Surgeon: Carole Civil, MD;  Location: [facility];  Service: Orthopedics;  Laterality: Right;    There were no vitals taken for this visit.  Visit Diagnosis:  Right knee pain  Weakness of right lower extremity  S/P right knee arthroscopy  Lack of coordination  Knee stiffness, right      Subjective Assessment - 09/23/14 1655    Symptoms Patient rports that when she stands she doesn't feel like she has the same amount of weight of weight ion her Rt knee as her Lt.    Currently in Pain? Yes   Pain Score 0-No pain   Pain Location Knee   Pain Orientation Right   Pain Type  Surgical pain   Pain Frequency Constant            [facility] Adult PT Treatment/Exercise - 09/23/14 0001    Knee/Hip Exercises: Stretches   Active Hamstring Stretch Limitations 3 way 10x 3 seconds   Quad Stretch 3 reps;30 seconds   Quad Stretch Limitations Prone with rope   Hip Flexor Stretch 3 reps;30 seconds   Hip Flexor Stretch Limitations 3 way on 14 in   Knee: Self-Stretch Limitations 10x 3 seconds on 14"    Gastroc Stretch 3 reps;30 seconds   Gastroc Stretch Limitations slant board   Knee/Hip Exercises: Standing   Heel Raises 20 reps  no UE   Heel Raises Limitations Toe Raises x20, 1 HHA   Forward Lunges 10 reps;Right   Forward Lunges Limitations static on floor   Functional Squat Limitations Squat matrixwith 3lb dumbbells 5x    SLS Single leg balance reach common 5x from 2" box            PT Short Term Goals - 09/23/14 1717    PT SHORT TERM GOAL #1   Title Patient will be able to extend knee  to <5 degrees from 0 to normalize stride   Baseline 09/18/14: -6 degrees    Status On-going   PT SHORT TERM GOAL #2   Title Patient will be able to flex Rt knee 130 degrees to squat through full depth   Baseline 09/18/14: 125 degrees   PT SHORT TERM GOAL #3   Title Patient will be able to stand and walk >80minutes to do grocery shopping with pain tolerable   Baseline 09/18/14: Pt was able to walk dogs for ~20 minutes.    Status On-going          PT Long Term Goals - 09/23/14 1718    PT LONG TERM GOAL #1   Title Patient will be able to fully extend Rt knee to 0 degrees to normalize gait   PT LONG TERM GOAL #2   Title Patient will be able to demonstrate Rt knee extension strength of 5/5 to be able to ambulate up and down stairs with out hand held assist   Status On-going   PT LONG TERM GOAL #3   Title Patient will demonstrate bilateral Glute strength of 4/5 MMT to be able to stand up from the floor   Status On-going   PT LONG TERM GOAL #4   Title Patient will  demonstrate decreased Rt knee pain so she can stand for 8 hours to return to work.           Plan - 09/23/14 1837    Clinical Impression Statement Session focused on increasign knee extension an dfunctional strengtheing of her knee. Patient dmeosntrated limited depth during squattign secondary to pain. Patient demosntrated good learign of single leg balance reaches but noted difficulty secondary to pain with anterior reaching secondary to limited quadriceps strength.    PT Next Visit Plan Continue working on improving knee ROM, knee extension > flexion, and strengthening program with good technique.        Problem List Patient Active Problem List   Diagnosis Date Noted  . Postoperative pulmonary edema 08/15/2014  . 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 Primary osteoarthritis of right knee 07/27/2014  . OA (osteoarthritis) of knee 06/16/2014  . Idiopathic parathyroidism 06/16/2014  . Tinea versicolor 06/16/2014  . Obesity, unspecified 04/06/2013  . Lack of coordination 10/21/2012  . CVA (cerebral infarction) 10/10/2012  . Vitamin d deficiency 11/10/2011  . Hearing loss 06/29/2011  . Carotid bruit 06/29/2011  . BACK PAIN, LUMBAR, WITH RADICULOPATHY 06/28/2010  . SCIATICA, LEFT 05/11/2010  . DEPRESSION 04/04/2010  . PERIPHERAL EDEMA 04/08/2009  . DEGENERATIVE JOINT DISEASE 03/23/2008  . HYPERLIPIDEMIA 11/18/2007  . INSOMNIA 10/17/2007  . ASTHMA 11/30/2006  . HYPERTENSION 09/05/2006  . ARTHRITIS 09/05/2006  . VERTIGO 09/05/2006    Devona Konig PT DPT [phone]

## 2014-09-24 ENCOUNTER — Ambulatory Visit (HOSPITAL_COMMUNITY): Payer: BC Managed Care – PPO

## 2014-09-25 ENCOUNTER — Ambulatory Visit (HOSPITAL_COMMUNITY)
Admission: RE | Admit: 2014-09-25 | Discharge: 2014-09-25 | Disposition: A | Discharge status: Home / Self Care | Payer: BC Managed Care – PPO | Source: Ambulatory Visit | Attending: Orthopedic Surgery | Admitting: Orthopedic Surgery

## 2014-09-25 DIAGNOSIS — Z9889 Other specified postprocedural states: Secondary | ICD-10-CM

## 2014-09-25 DIAGNOSIS — R29898 Other symptoms and signs involving the musculoskeletal system: Secondary | ICD-10-CM

## 2014-09-25 DIAGNOSIS — Z471 Aftercare following joint replacement surgery: Secondary | ICD-10-CM | POA: Diagnosis not present

## 2014-09-25 DIAGNOSIS — M25661 Stiffness of right knee, not elsewhere classified: Secondary | ICD-10-CM

## 2014-09-25 DIAGNOSIS — M25561 Pain in right knee: Secondary | ICD-10-CM

## 2014-09-25 DIAGNOSIS — R279 Unspecified lack of coordination: Secondary | ICD-10-CM

## 2014-09-25 NOTE — Therapy (Signed)
[place], [place], [address] Phone: [phone]   Fax:  [phone]  Physical Therapy Treatment  Patient Details  Name: Teresa Soto MRN: [id number] Date of Birth: Dec 10, 1955  Encounter Date: 09/25/2014      PT End of Session - 09/25/14 0945    Visit Number 7   Number of Visits 18   Date for PT Re-Evaluation 10/09/14   Authorization Type BC[employer]   Authorization Time Period to 11/09/13   Authorization - Visit Number 7   Authorization - Number of Visits 18   PT Start Time 0930   PT Stop Time 1015   PT Time Calculation (min) 45 min   Activity Tolerance Patient tolerated treatment well   Behavior During Therapy [facility] for tasks assessed/performed      Past Medical History  Diagnosis Date  . Vertigo   . Hypertension   . Asthma   . Neuropathic pain   . Restless legs syndrome 2007 approx  . Depression   . Chronic back pain   . Stroke   . Spinal headache   . Arthritis of knee, right 08/12/2014    Past Surgical History  Procedure Laterality Date  . Back surgery    . Tonsillectomy    . Cesarean section      x 2  . Shoulder arthroscopy w/ rotator cuff repair      to right shoulder  . Tubal ligation    . Spine surgery  12/2010    ruptd L1 L2 , Dr Carloyn Manner  . Total knee arthroplasty Right 08/12/2014    Procedure: RIGHT TOTAL KNEE ARTHROPLASTY;  Surgeon: Carole Civil, MD;  Location: [facility];  Service: Orthopedics;  Laterality: Right;    There were no vitals taken for this visit.  Visit Diagnosis:  Right knee pain  Weakness of right lower extremity  S/P right knee arthroscopy  Lack of coordination  Knee stiffness, right      Subjective Assessment - 09/25/14 0935    Symptoms Pt reported she is still sore following last session, her Rt knee is feeling better than her good knee.  No reports of pain just soreness today.  Pt reports she was able to complete stairs in the reciprocal pattern the "normal"  way today   Currently in Pain? No/denies          [facility] PT Assessment - 09/25/14 0001    Assessment   Medical Diagnosis Rt TKA   Onset Date 08/12/14   Next MD Visit [name] 09/29/14   Prior Therapy HHPT 3 weeks                  [facility] Adult PT Treatment/Exercise - 09/25/14 0957    Exercises   Exercises Knee/Hip   Knee/Hip Exercises: Stretches   Active Hamstring Stretch 3 reps;20 seconds   Active Hamstring Stretch Limitations 3 way 3 way 14in box   Quad Stretch 3 reps;30 seconds   Quad Stretch Limitations Prone with rope   Gastroc Stretch 3 reps;30 seconds   Gastroc Stretch Limitations slant board   Knee/Hip Exercises: Standing   Heel Raises 20 reps   Heel Raises Limitations Toe Raises x20, 1 HHA on airex   Forward Lunges 10 reps;Both   Forward Lunges Limitations Bil LE pivot in sagital plane on floor   Terminal Knee Extension 20 reps;Right   Theraband Level (Terminal Knee Extension) Level 4 (Blue)   Functional Squat Limitations Squat matrixwith 3lb dumbbells 5x  Stairs 7in stairs reciprocal pattern  5RT 1 HR   SLS Single leg balance reach common 5x from 2" box                  PT Short Term Goals - 09/25/14 0946    PT SHORT TERM GOAL #1   Title Patient will be able to extend knee to <5 degrees from 0 to normalize stride   Status Achieved   PT SHORT TERM GOAL #2   Title Patient will be able to flex Rt knee 130 degrees to squat through full depth   Status On-going   PT SHORT TERM GOAL #3   Title Patient will be able to stand and walk >27minutes to do grocery shopping with pain tolerable   Status On-going           PT Long Term Goals - 09/25/14 0956    PT LONG TERM GOAL #1   Title Patient will be able to fully extend Rt knee to 0 degrees to normalize gait   Status On-going   PT LONG TERM GOAL #2   Title Patient will be able to demonstrate Rt knee extension strength of 5/5 to be able to ambulate up and down stairs with out hand held assist    Status On-going   PT LONG TERM GOAL #3   Title Patient will demonstrate bilateral Glute strength of 4/5 MMT to be able to stand up from the floor   Status On-going   PT LONG TERM GOAL #4   Title Patient will demonstrate decreased Rt knee pain so she can stand for 8 hours to return to work.    Status On-going               Plan - 09/25/14 1021    Clinical Impression Statement Continued session focus on improving knee extension with functional strengthening activites to improve gait mechanics and stair training.  Pt able to demonstrate appropraite reciprocal pattern sequence on 7in height with noted muscle fatigue descending stairs due to quad weakness.  Improved knee extension to 3 degrees lacking AROM.   PT Next Visit Plan Continue working on improving knee ROM, knee extension > flexion, and strengthening program with good technique. Complete progress note prior MD apt next session.        Problem List Patient Active Problem List   Diagnosis Date Noted  . Postoperative pulmonary edema 08/15/2014  . 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 Primary osteoarthritis of right knee 07/27/2014  . OA (osteoarthritis) of knee 06/16/2014  . Idiopathic parathyroidism 06/16/2014  . Tinea versicolor 06/16/2014  . Obesity, unspecified 04/06/2013  . Lack of coordination 10/21/2012  . CVA (cerebral infarction) 10/10/2012  . Vitamin d deficiency 11/10/2011  . Hearing loss 06/29/2011  . Carotid bruit 06/29/2011  . BACK PAIN, LUMBAR, WITH RADICULOPATHY 06/28/2010  . SCIATICA, LEFT 05/11/2010  . DEPRESSION 04/04/2010  . PERIPHERAL EDEMA 04/08/2009  . DEGENERATIVE JOINT DISEASE 03/23/2008  . HYPERLIPIDEMIA 11/18/2007  . INSOMNIA 10/17/2007  . ASTHMA 11/30/2006  . HYPERTENSION 09/05/2006  . ARTHRITIS 09/05/2006  . VERTIGO 09/05/2006   Ihor Austin, [place]  Aldona Lento 09/25/2014, 10:33 AM  [place], [place], [address] Phone: [phone]   Fax:  [phone]

## 2014-09-28 ENCOUNTER — Encounter: Payer: Self-pay | Admitting: Orthopedic Surgery

## 2014-09-28 ENCOUNTER — Ambulatory Visit (INDEPENDENT_AMBULATORY_CARE_PROVIDER_SITE_OTHER): Payer: BC Managed Care – PPO | Admitting: Orthopedic Surgery

## 2014-09-28 VITALS — BP 127/80 | Ht 61.0 in | Wt 160.0 lb

## 2014-09-28 DIAGNOSIS — M1711 Unilateral primary osteoarthritis, right knee: Secondary | ICD-10-CM

## 2014-09-28 DIAGNOSIS — Z96651 Presence of right artificial knee joint: Secondary | ICD-10-CM

## 2014-09-28 NOTE — Progress Notes (Signed)
Postop appointment  Chief Complaint  Patient presents with  . Follow-up    Post op #2, righ tknee, TKA, DOS 08-12-14.    47 days postop from a right total knee arthroplasty doing well.  She has finished physical therapy. She has almost equal flexion to her opposite knee she says she's lacking extension but I don't detect  She walks without a cane. Her incision is clean dry and intact without redness or erythema. There is no joint effusion.  I think she's doing great see her again in a month she still out of work she did need any pain medication she should continue home exercises.

## 2014-09-28 NOTE — Patient Instructions (Signed)
Home exercises and walking

## 2014-09-29 ENCOUNTER — Ambulatory Visit (HOSPITAL_COMMUNITY)
Admission: RE | Admit: 2014-09-29 | Discharge: 2014-09-29 | Disposition: A | Discharge status: Home / Self Care | Payer: BC Managed Care – PPO | Source: Ambulatory Visit | Attending: Family Medicine | Admitting: Family Medicine

## 2014-09-29 DIAGNOSIS — M25561 Pain in right knee: Secondary | ICD-10-CM

## 2014-09-29 DIAGNOSIS — M25661 Stiffness of right knee, not elsewhere classified: Secondary | ICD-10-CM

## 2014-09-29 DIAGNOSIS — R29898 Other symptoms and signs involving the musculoskeletal system: Secondary | ICD-10-CM

## 2014-09-29 DIAGNOSIS — Z471 Aftercare following joint replacement surgery: Secondary | ICD-10-CM | POA: Diagnosis not present

## 2014-09-29 DIAGNOSIS — Z9889 Other specified postprocedural states: Secondary | ICD-10-CM

## 2014-09-29 NOTE — Therapy (Signed)
[place], [place], [address] Phone: [phone]   Fax:  [phone]  Physical Therapy Treatment  Patient Details  Name: Teresa Soto MRN: [id number] Date of Birth: 1956/09/08  Encounter Date: 09/29/2014      PT End of Session - 09/29/14 1545    Visit Number 8   Number of Visits 18   Date for PT Re-Evaluation 10/09/14   Authorization Type [employer]   Authorization Time Period to 11/09/13   Authorization - Visit Number 8   Authorization - Number of Visits 18   PT Start Time 0160   PT Stop Time 1600   PT Time Calculation (min) 45 min   Activity Tolerance Patient tolerated treatment well   Behavior During Therapy [facility] for tasks assessed/performed      Past Medical History  Diagnosis Date  . Vertigo   . Hypertension   . Asthma   . Neuropathic pain   . Restless legs syndrome 2007 approx  . Depression   . Chronic back pain   . Stroke   . Spinal headache   . Arthritis of knee, right 08/12/2014    Past Surgical History  Procedure Laterality Date  . Back surgery    . Tonsillectomy    . Cesarean section      x 2  . Shoulder arthroscopy w/ rotator cuff repair      to right shoulder  . Tubal ligation    . Spine surgery  12/2010    ruptd L1 L2 , Dr Carloyn Manner  . Total knee arthroplasty Right 08/12/2014    Procedure: RIGHT TOTAL KNEE ARTHROPLASTY;  Surgeon: Carole Civil, MD;  Location: [facility];  Service: Orthopedics;  Laterality: Right;    There were no vitals taken for this visit.  Visit Diagnosis:  Right knee pain  Weakness of right lower extremity  S/P right knee arthroscopy  Knee stiffness, right      Subjective Assessment - 09/29/14 1519    Symptoms Patient reports knee is feeling better and being excited about recent visit to MD. Notes imrpovign strength though she was fvery fatiguesd following last session   Currently in Pain? No/denies   Pain Score 0-No pain           [facility] Adult PT  Treatment/Exercise - 09/29/14 0001    Knee/Hip Exercises: Stretches   Active Hamstring Stretch 20 seconds;2 reps   Active Hamstring Stretch Limitations 3 way 14in box   Quad Stretch 3 reps;30 seconds   Quad Stretch Limitations Prone with rope   Gastroc Stretch 3 reps;30 seconds   Gastroc Stretch Limitations slant board   Knee/Hip Exercises: Standing   Heel Raises 10 reps;3 sets   Heel Raises Limitations Toe Raises x20, 1 HHA on airex   Forward Lunges 10 reps   Forward Lunges Limitations with 3 way UE reach   Side Lunges Limitations painfull, discontinued unlit pain improves.    Lateral Step Up 10 reps;Hand Hold: 0;Right;Step Height: 8"   Forward Step Up 10 reps;Right;Step Height: 8";Hand Hold: 0   Step Down 10 reps;Hand Hold: 0;Right;Step Height: 6"   Functional Squat Limitations 10x sit to stand   SLS Single leg balance reach common 5x from 2" box, forward and posterior directions progressed to 4" box            PT Short Term Goals - 09/25/14 0946    PT SHORT TERM GOAL #1   Title Patient will be able to  extend knee to <5 degrees from 0 to normalize stride   Status Achieved   PT SHORT TERM GOAL #2   Title Patient will be able to flex Rt knee 130 degrees to squat through full depth   Status On-going   PT SHORT TERM GOAL #3   Title Patient will be able to stand and walk >69minutes to do grocery shopping with pain tolerable   Status On-going           PT Long Term Goals - 09/25/14 0956    PT LONG TERM GOAL #1   Title Patient will be able to fully extend Rt knee to 0 degrees to normalize gait   Status On-going   PT LONG TERM GOAL #2   Title Patient will be able to demonstrate Rt knee extension strength of 5/5 to be able to ambulate up and down stairs with out hand held assist   Status On-going   PT LONG TERM GOAL #3   Title Patient will demonstrate bilateral Glute strength of 4/5 MMT to be able to stand up from the floor   Status On-going   PT LONG TERM GOAL #4    Title Patient will demonstrate decreased Rt knee pain so she can stand for 8 hours to return to work.    Status On-going           Plan - 09/29/14 1545    Clinical Impression Statement Patient displays improved mechanics this session with only minor limitation initially noted in knee extension tat was improved by end of session with no gait abnormalities noted. Patient cotnineus to have pain/weakness as indicated by difficulty descending steps >4 inches. Initiated bike for increasing knee flexion WROM.    PT Next Visit Plan Continue working on improving knee ROM, with focus shiftign to increasing knee flexion and stair/sit to stand/lunge depth to improve return top function. Patient returns tomorrow for which focus will be placed flexibility and dynamic gait actitvities to prevent excessive soreness.         Problem List Patient Active Problem List   Diagnosis Date Noted  . Postoperative pulmonary edema 08/15/2014  . 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 Primary osteoarthritis of right knee 07/27/2014  . OA (osteoarthritis) of knee 06/16/2014  . Idiopathic parathyroidism 06/16/2014  . Tinea versicolor 06/16/2014  . Obesity, unspecified 04/06/2013  . Lack of coordination 10/21/2012  . CVA (cerebral infarction) 10/10/2012  . Vitamin d deficiency 11/10/2011  . Hearing loss 06/29/2011  . Carotid bruit 06/29/2011  . BACK PAIN, LUMBAR, WITH RADICULOPATHY 06/28/2010  . SCIATICA, LEFT 05/11/2010  . DEPRESSION 04/04/2010  . PERIPHERAL EDEMA 04/08/2009  . DEGENERATIVE JOINT DISEASE 03/23/2008  . HYPERLIPIDEMIA 11/18/2007  . INSOMNIA 10/17/2007  . ASTHMA 11/30/2006  . HYPERTENSION 09/05/2006  . ARTHRITIS 09/05/2006  . VERTIGO 09/05/2006    Devona Konig PT DPT [phone]

## 2014-09-30 ENCOUNTER — Ambulatory Visit (HOSPITAL_COMMUNITY)
Admission: RE | Admit: 2014-09-30 | Discharge: 2014-09-30 | Disposition: A | Discharge status: Home / Self Care | Payer: BC Managed Care – PPO | Source: Ambulatory Visit | Attending: Family Medicine | Admitting: Family Medicine

## 2014-09-30 DIAGNOSIS — M25561 Pain in right knee: Secondary | ICD-10-CM

## 2014-09-30 DIAGNOSIS — Z471 Aftercare following joint replacement surgery: Secondary | ICD-10-CM | POA: Diagnosis not present

## 2014-09-30 DIAGNOSIS — R29898 Other symptoms and signs involving the musculoskeletal system: Secondary | ICD-10-CM

## 2014-09-30 DIAGNOSIS — Z9889 Other specified postprocedural states: Secondary | ICD-10-CM

## 2014-09-30 DIAGNOSIS — R279 Unspecified lack of coordination: Secondary | ICD-10-CM

## 2014-09-30 DIAGNOSIS — M25661 Stiffness of right knee, not elsewhere classified: Secondary | ICD-10-CM

## 2014-09-30 NOTE — Therapy (Signed)
[place], [place], [address] Phone: [phone]   Fax:  [phone]  Physical Therapy Treatment  Patient Details  Name: Teresa Soto MRN: [id number] Date of Birth: 1956/02/12  Encounter Date: 09/30/2014      PT End of Session - 09/30/14 0815    Visit Number 9   Number of Visits 18   Date for PT Re-Evaluation 10/09/14   Authorization Type [employer]   Authorization Time Period to 11/09/13   Authorization - Visit Number 9   Authorization - Number of Visits 18   PT Start Time 0800   PT Stop Time 0845   PT Time Calculation (min) 45 min   Activity Tolerance Patient tolerated treatment well   Behavior During Therapy [facility] for tasks assessed/performed      Past Medical History  Diagnosis Date  . Vertigo   . Hypertension   . Asthma   . Neuropathic pain   . Restless legs syndrome 2007 approx  . Depression   . Chronic back pain   . Stroke   . Spinal headache   . Arthritis of knee, right 08/12/2014    Past Surgical History  Procedure Laterality Date  . Back surgery    . Tonsillectomy    . Cesarean section      x 2  . Shoulder arthroscopy w/ rotator cuff repair      to right shoulder  . Tubal ligation    . Spine surgery  12/2010    ruptd L1 L2 , Dr Carloyn Manner  . Total knee arthroplasty Right 08/12/2014    Procedure: RIGHT TOTAL KNEE ARTHROPLASTY;  Surgeon: Carole Civil, MD;  Location: [facility];  Service: Orthopedics;  Laterality: Right;    There were no vitals taken for this visit.  Visit Diagnosis:  Right knee pain  Weakness of right lower extremity  S/P right knee arthroscopy  Knee stiffness, right  Lack of coordination      Subjective Assessment - 09/30/14 0803    Symptoms Patient reports contineud anterior knee pain, but otherwse feels good and notes minimal to no soreness followign yesterday's session.    Currently in Pain? Yes   Pain Score 1    Pain Location Knee   Pain Orientation Right           [facility] PT Assessment - 09/30/14 0001    Assessment   Medical Diagnosis Rt TKA   Onset Date 08/12/14   Next MD Visit [name] 09/29/14   Prior Therapy [facility] 3 weeks   Precautions   Precautions None   Restrictions   Weight Bearing Restrictions No                  [facility] Adult PT Treatment/Exercise - 09/30/14 0001    Knee/Hip Exercises: Stretches   Active Hamstring Stretch 20 seconds;2 reps   Active Hamstring Stretch Limitations 3 way 14in box   Quad Stretch 3 reps;30 seconds   Quad Stretch Limitations Prone with rope   Knee: Self-Stretch Limitations 10x 3 seconds on 14"    Gastroc Stretch 3 reps;30 seconds   Gastroc Stretch Limitations slant board   Knee/Hip Exercises: Aerobic   Stationary Bike 37min seat position 4   Knee/Hip Exercises: Standing   Forward Lunges 10 reps   Forward Lunges Limitations with 3 way UE reach to mid shin height   Terminal Knee Extension --   Theraband Level (Terminal Knee Extension) --   Functional Squat Limitations reverse golfer  squat wit yellow ball toss10x   Gait Training Tandem gait on airex beams forwards and backwards 4x.    Other Standing Knee Exercises split stance 3D hip excursions 10x   Other Standing Knee Exercises walking 2D hip excursions 10x                  PT Short Term Goals - 09/25/14 0946    PT SHORT TERM GOAL #1   Title Patient will be able to extend knee to <5 degrees from 0 to normalize stride   Status Achieved   PT SHORT TERM GOAL #2   Title Patient will be able to flex Rt knee 130 degrees to squat through full depth   Status On-going   PT SHORT TERM GOAL #3   Title Patient will be able to stand and walk >54minutes to do grocery shopping with pain tolerable   Status On-going           PT Long Term Goals - 09/25/14 0956    PT LONG TERM GOAL #1   Title Patient will be able to fully extend Rt knee to 0 degrees to normalize gait   Status On-going   PT LONG TERM GOAL #2   Title Patient will  be able to demonstrate Rt knee extension strength of 5/5 to be able to ambulate up and down stairs with out hand held assist   Status On-going   PT LONG TERM GOAL #3   Title Patient will demonstrate bilateral Glute strength of 4/5 MMT to be able to stand up from the floor   Status On-going   PT LONG TERM GOAL #4   Title Patient will demonstrate decreased Rt knee pain so she can stand for 8 hours to return to work.    Status On-going               Plan - 09/30/14 0815    Clinical Impression Statement Session focused on functional mobility and gait training today as patient focused on strengtheing last session which was yesterday and avoiding excessive soreness is desirable as patient returns tomorrow. Patient demonstrated near full knee flexion and extension with only pain during squatting at end range depth. aptient dmeosntrated improved gait at end of session with more even stride length.    PT Next Visit Plan Continue working on improving knee ROM, with focus shifting to increasing knee flexion and stair/sit to stand/lunge depth to improve return top function. Contineu balance exercises to improve stability         Problem List Patient Active Problem List   Diagnosis Date Noted  . Postoperative pulmonary edema 08/15/2014  . 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 Primary osteoarthritis of right knee 07/27/2014  . OA (osteoarthritis) of knee 06/16/2014  . Idiopathic parathyroidism 06/16/2014  . Tinea versicolor 06/16/2014  . Obesity, unspecified 04/06/2013  . Lack of coordination 10/21/2012  . CVA (cerebral infarction) 10/10/2012  . Vitamin d deficiency 11/10/2011  . Hearing loss 06/29/2011  . Carotid bruit 06/29/2011  . BACK PAIN, LUMBAR, WITH RADICULOPATHY 06/28/2010  . SCIATICA, LEFT 05/11/2010  . DEPRESSION 04/04/2010  . PERIPHERAL EDEMA 04/08/2009  . DEGENERATIVE JOINT DISEASE 03/23/2008  . HYPERLIPIDEMIA 11/18/2007  . INSOMNIA 10/17/2007  . ASTHMA 11/30/2006  .  HYPERTENSION 09/05/2006  . ARTHRITIS 09/05/2006  . VERTIGO 09/05/2006    Loreto Loescher R 09/30/2014, 8:47 AM  [place] [address] [place], [place], [address] Phone: [phone]   Fax:  [phone]      

## 2014-10-01 ENCOUNTER — Encounter (HOSPITAL_COMMUNITY): Payer: Self-pay

## 2014-10-01 ENCOUNTER — Ambulatory Visit (HOSPITAL_COMMUNITY)
Admission: RE | Admit: 2014-10-01 | Discharge: 2014-10-01 | Disposition: A | Discharge status: Home / Self Care | Payer: BC Managed Care – PPO | Source: Ambulatory Visit | Attending: Family Medicine | Admitting: Family Medicine

## 2014-10-01 DIAGNOSIS — M25561 Pain in right knee: Secondary | ICD-10-CM

## 2014-10-01 DIAGNOSIS — R279 Unspecified lack of coordination: Secondary | ICD-10-CM

## 2014-10-01 DIAGNOSIS — Z9889 Other specified postprocedural states: Secondary | ICD-10-CM

## 2014-10-01 DIAGNOSIS — M25661 Stiffness of right knee, not elsewhere classified: Secondary | ICD-10-CM

## 2014-10-01 DIAGNOSIS — R29898 Other symptoms and signs involving the musculoskeletal system: Secondary | ICD-10-CM

## 2014-10-01 DIAGNOSIS — Z471 Aftercare following joint replacement surgery: Secondary | ICD-10-CM | POA: Diagnosis not present

## 2014-10-01 NOTE — Therapy (Signed)
[place], [place], [address] Phone: [phone]   Fax:  [phone]  Physical Therapy Treatment  Patient Details  Name: Teresa Soto MRN: [id number] Date of Birth: 05/17/1956  Encounter Date: 10/01/2014      PT End of Session - 10/01/14 0940    Visit Number 10   Number of Visits 18   Date for PT Re-Evaluation 10/09/14   Authorization Type [employer]   Authorization Time Period to 11/09/13   Authorization - Visit Number 10   Authorization - Number of Visits 18   PT Start Time 0848   PT Stop Time 0932   PT Time Calculation (min) 44 min   Activity Tolerance Patient tolerated treatment well   Behavior During Therapy [facility] for tasks assessed/performed      Past Medical History  Diagnosis Date  . Vertigo   . Hypertension   . Asthma   . Neuropathic pain   . Restless legs syndrome 2007 approx  . Depression   . Chronic back pain   . Stroke   . Spinal headache   . Arthritis of knee, right 08/12/2014    Past Surgical History  Procedure Laterality Date  . Back surgery    . Tonsillectomy    . Cesarean section      x 2  . Shoulder arthroscopy w/ rotator cuff repair      to right shoulder  . Tubal ligation    . Spine surgery  12/2010    ruptd L1 L2 , Dr Carloyn Manner  . Total knee arthroplasty Right 08/12/2014    Procedure: RIGHT TOTAL KNEE ARTHROPLASTY;  Surgeon: Carole Civil, MD;  Location: [facility];  Service: Orthopedics;  Laterality: Right;    There were no vitals taken for this visit.  Visit Diagnosis:  Right knee pain  Weakness of right lower extremity  S/P right knee arthroscopy  Knee stiffness, right  Lack of coordination      Subjective Assessment - 10/01/14 0855    Symptoms Pt stated she has been liking exercises with therapy, stated she can feel the improvements without increased pain.  Pain minimal today 2/10 with most difficulty with stairs    Currently in Pain? Yes   Pain Score 2    Pain  Location Knee   Pain Orientation Right          [facility] PT Assessment - 10/01/14 0001    Assessment   Medical Diagnosis Rt TKA   Onset Date 08/12/14   Next MD Visit [name] 11/03/2014   Prior Therapy [facility] 3 weeks   Precautions   Precautions None                  [facility] Adult PT Treatment/Exercise - 10/01/14 0900    Exercises   Exercises Knee/Hip   Knee/Hip Exercises: Stretches   Active Hamstring Stretch 20 seconds;2 reps   Active Hamstring Stretch Limitations 3 way 14in box   Quad Stretch 3 reps;30 seconds   Quad Stretch Limitations Prone with rope   Knee: Self-Stretch Limitations 10x 3 seconds on 14"    Gastroc Stretch 3 reps;30 seconds   Gastroc Stretch Limitations slant board   Knee/Hip Exercises: Aerobic   Stationary Bike 42min seat position 4   Knee/Hip Exercises: Standing   Forward Lunges 10 reps   Forward Lunges Limitations with 3 way UE reach to mid shin height   Functional Squat Limitations reverse golfer squat wit yellow ball toss10x  Stairs 7in stairs reciprocal pattern  2RT 1 HR; cueing for weight loading to reduce pain   SLS SLS Lt 32", Rt 52" max of 3;Single leg balance reach common 5x from 2" box, forward and psterior directions progressed with UE reaching to improve weight loading    Gait Training Tandem gait on balance beams forwards and backwards 4x.    Other Standing Knee Exercises split stance 3D hip excursions 10x   Other Standing Knee Exercises walking 2D hip excursions 10x                  PT Short Term Goals - 10/01/14 0953    PT SHORT TERM GOAL #1   Title Patient will be able to extend knee to <5 degrees from 0 to normalize stride   PT SHORT TERM GOAL #2   Title Patient will be able to flex Rt knee 130 degrees to squat through full depth   Status On-going   PT SHORT TERM GOAL #3   Title Patient will be able to stand and walk >72minutes to do grocery shopping with pain tolerable   Status On-going           PT Long Term  Goals - 10/01/14 0953    PT LONG TERM GOAL #1   Title Patient will be able to fully extend Rt knee to 0 degrees to normalize gait   Status On-going   PT LONG TERM GOAL #2   Title Patient will be able to demonstrate Rt knee extension strength of 5/5 to be able to ambulate up and down stairs with out hand held assist   Status On-going   PT LONG TERM GOAL #3   Title Patient will demonstrate bilateral Glute strength of 4/5 MMT to be able to stand up from the floor   Status On-going   PT LONG TERM GOAL #4   Title Patient will demonstrate decreased Rt knee pain so she can stand for 8 hours to return to work.    Status On-going             Plan - 10/01/14 0941    Clinical Impression Statement Session focus on instructing appropriate weight loading for pain control with balance and functional strenghtening activities, pt able to demonstrate reciprocal pattern stair training with reports of no increased pain.  Continued balance training on dynamic surface, pt able to recover LOB episodes independenlty with standby assistance.   PT Next Visit Plan Continue working on improving knee ROM, with focus shifting to increasing knee flexion and stair/sit to stand/lunge depth to improve return top function. Contineu balance exercises to improve stability         Problem List Patient Active Problem List   Diagnosis Date Noted  . Postoperative pulmonary edema 08/15/2014  . 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 Primary osteoarthritis of right knee 07/27/2014  . OA (osteoarthritis) of knee 06/16/2014  . Idiopathic parathyroidism 06/16/2014  . Tinea versicolor 06/16/2014  . Obesity, unspecified 04/06/2013  . Lack of coordination 10/21/2012  . CVA (cerebral infarction) 10/10/2012  . Vitamin d deficiency 11/10/2011  . Hearing loss 06/29/2011  . Carotid bruit 06/29/2011  . BACK PAIN, LUMBAR, WITH RADICULOPATHY 06/28/2010  . SCIATICA, LEFT 05/11/2010  . DEPRESSION 04/04/2010  . PERIPHERAL EDEMA  04/08/2009  . DEGENERATIVE JOINT DISEASE 03/23/2008  . HYPERLIPIDEMIA 11/18/2007  . INSOMNIA 10/17/2007  . ASTHMA 11/30/2006  . HYPERTENSION 09/05/2006  . ARTHRITIS 09/05/2006  . VERTIGO 09/05/2006   Ihor Austin, [place]  Aldona Lento 10/01/2014, 10:07 AM  [place], [place], [address] Phone: [phone]   Fax:  [phone]

## 2014-10-07 ENCOUNTER — Encounter (HOSPITAL_COMMUNITY): Payer: Self-pay | Admitting: Emergency Medicine

## 2014-10-07 ENCOUNTER — Emergency Department (HOSPITAL_COMMUNITY)
Admission: EM | Admit: 2014-10-07 | Discharge: 2014-10-07 | Disposition: A | Discharge status: Home / Self Care | Payer: BC Managed Care – PPO | Attending: Emergency Medicine | Admitting: Emergency Medicine

## 2014-10-07 ENCOUNTER — Ambulatory Visit (HOSPITAL_COMMUNITY): Payer: BC Managed Care – PPO

## 2014-10-07 DIAGNOSIS — H5589 Other irregular eye movements: Secondary | ICD-10-CM | POA: Insufficient documentation

## 2014-10-07 DIAGNOSIS — R110 Nausea: Secondary | ICD-10-CM | POA: Insufficient documentation

## 2014-10-07 DIAGNOSIS — R42 Dizziness and giddiness: Secondary | ICD-10-CM | POA: Diagnosis not present

## 2014-10-07 DIAGNOSIS — Z79899 Other long term (current) drug therapy: Secondary | ICD-10-CM | POA: Diagnosis not present

## 2014-10-07 DIAGNOSIS — Z8673 Personal history of transient ischemic attack (TIA), and cerebral infarction without residual deficits: Secondary | ICD-10-CM | POA: Insufficient documentation

## 2014-10-07 DIAGNOSIS — I10 Essential (primary) hypertension: Secondary | ICD-10-CM | POA: Diagnosis not present

## 2014-10-07 DIAGNOSIS — G2581 Restless legs syndrome: Secondary | ICD-10-CM | POA: Insufficient documentation

## 2014-10-07 DIAGNOSIS — Z8739 Personal history of other diseases of the musculoskeletal system and connective tissue: Secondary | ICD-10-CM | POA: Insufficient documentation

## 2014-10-07 DIAGNOSIS — Z87891 Personal history of nicotine dependence: Secondary | ICD-10-CM | POA: Insufficient documentation

## 2014-10-07 DIAGNOSIS — J45909 Unspecified asthma, uncomplicated: Secondary | ICD-10-CM | POA: Diagnosis not present

## 2014-10-07 DIAGNOSIS — G8929 Other chronic pain: Secondary | ICD-10-CM | POA: Diagnosis not present

## 2014-10-07 DIAGNOSIS — Z7951 Long term (current) use of inhaled steroids: Secondary | ICD-10-CM | POA: Insufficient documentation

## 2014-10-07 HISTORY — DX: Meniere's disease, unspecified ear: H81.09

## 2014-10-07 LAB — I-STAT CHEM 8, ED
BUN: 16 mg/dL (ref 6–23)
CALCIUM ION: 1.56 mmol/L — AB (ref 1.12–1.23)
CREATININE: 0.8 mg/dL (ref 0.50–1.10)
Chloride: 106 mEq/L (ref 96–112)
Glucose, Bld: 119 mg/dL — ABNORMAL HIGH (ref 70–99)
HCT: 39 % (ref 36.0–46.0)
HEMOGLOBIN: 13.3 g/dL (ref 12.0–15.0)
POTASSIUM: 4 mmol/L (ref 3.5–5.1)
Sodium: 137 mmol/L (ref 135–145)
TCO2: 23 mmol/L (ref 0–100)

## 2014-10-07 MED ORDER — ONDANSETRON HCL 4 MG PO TABS: 4.0000 mg | ORAL_TABLET | Freq: Four times a day (QID) | ORAL | Status: DC

## 2014-10-07 MED ORDER — SODIUM CHLORIDE 0.9 % IV SOLN
INTRAVENOUS | Status: DC
  Administered 2014-10-07: 09:00:00 via INTRAVENOUS

## 2014-10-07 MED ORDER — PROMETHAZINE HCL 25 MG/ML IJ SOLN
12.5000 mg | Freq: Once | INTRAMUSCULAR | Status: AC
  Administered 2014-10-07: 12.5 mg via INTRAVENOUS
  Filled 2014-10-07: qty 1

## 2014-10-07 MED ORDER — MECLIZINE HCL 50 MG PO TABS: 50.0000 mg | ORAL_TABLET | Freq: Two times a day (BID) | ORAL | Status: DC | PRN

## 2014-10-07 MED ORDER — MECLIZINE HCL 12.5 MG PO TABS
50.0000 mg | ORAL_TABLET | Freq: Once | ORAL | Status: AC
  Administered 2014-10-07: 50 mg via ORAL
  Filled 2014-10-07: qty 4

## 2014-10-07 NOTE — ED Provider Notes (Signed)
CSN: [id number]     Arrival date & time 10/07/14  0350 History  This chart was scribed for Dorie Rank, MD by Lowella Petties, ED Scribe. The patient was seen in room APA08/APA08. Patient's care was started at 7:56 AM.   Chief Complaint  Patient presents with  . Dizziness   The history is provided by the patient. No language interpreter was used.   HPI Comments: Teresa Soto is a 58 y.o. female who presents to the Emergency Department complaining of vertigo which began when she woke up this morning. She states that she has associated nausea. She reports a history of vertigo for the past 15 years associated with her Meniere's disease. She sees Dr. Melene Plan for this. She reports taking Meclizine without relief. She states that she has constant humming in her ears which is normal for her. She reports a history of stroke 3 years ago.   Past Medical History  Diagnosis Date  . Vertigo   . Hypertension   . Asthma   . Neuropathic pain   . Restless legs syndrome 2007 approx  . Depression   . Chronic back pain   . Stroke   . Spinal headache   . 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 Meniere disease    Past Surgical History  Procedure Laterality Date  . Back surgery    . Tonsillectomy    . Cesarean section      x 2  . Shoulder arthroscopy w/ rotator cuff repair      to right shoulder  . Tubal ligation    . Spine surgery  12/2010    ruptd L1 L2 , Dr Carloyn Manner  . Total knee arthroplasty Right 08/12/2014    Procedure: RIGHT TOTAL KNEE ARTHROPLASTY;  Surgeon: Carole Civil, MD;  Location: [facility];  Service: Orthopedics;  Laterality: Right;   History reviewed. No pertinent family history. History  Substance Use Topics  . Smoking status: Former Smoker -- 2.00 packs/day for 2 years    Quit date: 08/06/1984  . Smokeless tobacco: Never Used     Comment: quit in 1984  . Alcohol Use: Yes     Comment: occasionally   OB History    No data available     Review of Systems  Neurological: Positive  for dizziness.  A complete 10 system review of systems was obtained and all systems are negative except as noted in the HPI and PMH.   Allergies  Other and Influenza vaccines  Home Medications   Prior to Admission medications   Medication Sig Start Date End Date Taking? Authorizing Provider  albuterol (PROVENTIL HFA;VENTOLIN HFA) 108 (90 BASE) MCG/ACT inhaler Inhale 2 puffs into the lungs every 6 (six) hours as needed. Shortness of Breath 10/20/13  Yes Alycia Rossetti, MD  escitalopram (LEXAPRO) 20 MG tablet Take 1 tablet (20 mg total) by mouth daily. 07/06/14  Yes Alycia Rossetti, MD  lisinopril (PRINIVIL,ZESTRIL) 10 MG tablet TAKE ONE TABLET BY MOUTH ONCE DAILY 07/06/14  Yes Alycia Rossetti, MD  Melatonin 10 MG TABS Take 1 tablet by mouth at bedtime.   Yes Historical Provider, MD  mometasone (NASONEX) 50 MCG/ACT nasal spray Place 2 sprays into the nose daily.   Yes Historical Provider, MD  montelukast (SINGULAIR) 10 MG tablet TAKE ONE TABLET BY MOUTH ONCE DAILY AT BEDTIME 07/06/14  Yes Alycia Rossetti, MD  polyethylene glycol [facility] / GLYCOLAX) packet Take 17 g by mouth daily. 08/16/14  Yes Tim Lair  [name], MD  pramipexole (MIRAPEX) 0.25 MG tablet TAKE ONE TABLET BY MOUTH ONCE DAILY AT BEDTIME 09/17/14  Yes Alycia Rossetti, MD  meclizine (ANTIVERT) 50 MG tablet Take 1 tablet (50 mg total) by mouth 2 (two) times daily as needed for dizziness. 10/07/14   Dorie Rank, MD  ondansetron (ZOFRAN) 4 MG tablet Take 1 tablet (4 mg total) by mouth every 6 (six) hours. 10/07/14   Dorie Rank, MD   Triage Vitals: BP 134/95 mmHg  Pulse 92  Temp(Src) 97.7 F (36.5 C) (Oral)  Resp 18  Ht 5\' 1"  (1.549 m)  Wt 160 lb (72.576 kg)  BMI 30.25 kg/m2  SpO2 100% Physical Exam  Constitutional: She appears well-developed and well-nourished. No distress.  HENT:  Head: Normocephalic and atraumatic.  Right Ear: External ear normal.  Left Ear: External ear normal.  Eyes: Conjunctivae are normal. Right eye  exhibits no discharge. Left eye exhibits no discharge. No scleral icterus.  positive horozontal nystagmus   Neck: Neck supple. No tracheal deviation present.  Cardiovascular: Normal rate, regular rhythm and intact distal pulses.   Pulmonary/Chest: Effort normal and breath sounds normal. No stridor. No respiratory distress. She has no wheezes. She has no rales.  Abdominal: Soft. Bowel sounds are normal. She exhibits no distension. There is no tenderness. There is no rebound and no guarding.  Musculoskeletal: She exhibits no edema or tenderness.  Neurological: She is alert. She has normal strength. No cranial nerve deficit (no facial droop, extraocular movements intact, no slurred speech) or sensory deficit. She exhibits normal muscle tone. She displays no seizure activity. Coordination normal.  Skin: Skin is warm and dry. No rash noted.  Psychiatric: She has a normal mood and affect.  Nursing note and vitals reviewed.   ED Course  Procedures (including critical care time) DIAGNOSTIC STUDIES: Oxygen Saturation is 100% on room air, normal by my interpretation.    COORDINATION OF CARE: 7:59 AM-Discussed treatment plan which includes nausea medication with pt at bedside and pt agreed to plan.   Labs Review Labs Reviewed  I-STAT CHEM 8, ED - Abnormal; Notable for the following:    Glucose, Bld 119 (*)    Calcium, Ion 1.56 (*)    All other components within normal limits    Imaging Review No results found.   EKG Interpretation   Date/Time:  Wednesday October 07 2014 07:54:30 EST Ventricular Rate:  81 PR Interval:  174 QRS Duration: 82 QT Interval:  374 QTC Calculation: 434 R Axis:   103 Text Interpretation:  Sinus rhythm Probable lateral infarct, age  indeterminate Baseline wander in lead(s) V1 ?limb lead reversal since last  tracing, similar to 23 Mar 2009 tracing  Confirmed by Keriann Rankin  MD-J, Shawntia Mangal  ([id number]) on 10/07/2014 8:05:58 AM      MDM   Final diagnoses:  Vertigo    Symptoms likely related to her Menier's disease.  No focal neuro deficits.  Doubt stroke, CVA.  Routine follow up on her calcium level. I personally performed the services described in this documentation, which was scribed in my presence.  The recorded information has been reviewed and is accurate.    Dorie Rank, MD 10/09/14 [phone]

## 2014-10-07 NOTE — ED Notes (Signed)
Patient complaining of dizziness and nausea upon awakening at 0500 today. States she has a history of vertigo. States she took one ODT zofran prior to arrival to ED.

## 2014-10-07 NOTE — ED Notes (Signed)
MD at bedside. 

## 2014-10-12 ENCOUNTER — Ambulatory Visit (HOSPITAL_COMMUNITY)
Admission: RE | Admit: 2014-10-12 | Discharge: 2014-10-12 | Disposition: A | Discharge status: Home / Self Care | Payer: BLUE CROSS/BLUE SHIELD | Source: Ambulatory Visit | Attending: Family Medicine | Admitting: Family Medicine

## 2014-10-12 DIAGNOSIS — M25661 Stiffness of right knee, not elsewhere classified: Secondary | ICD-10-CM | POA: Insufficient documentation

## 2014-10-12 DIAGNOSIS — Z9889 Other specified postprocedural states: Secondary | ICD-10-CM

## 2014-10-12 DIAGNOSIS — Z471 Aftercare following joint replacement surgery: Secondary | ICD-10-CM | POA: Insufficient documentation

## 2014-10-12 DIAGNOSIS — M25561 Pain in right knee: Secondary | ICD-10-CM | POA: Diagnosis not present

## 2014-10-12 DIAGNOSIS — Z96651 Presence of right artificial knee joint: Secondary | ICD-10-CM | POA: Insufficient documentation

## 2014-10-12 DIAGNOSIS — R29898 Other symptoms and signs involving the musculoskeletal system: Secondary | ICD-10-CM

## 2014-10-12 NOTE — Therapy (Signed)
[place], [place], [address] Phone: [phone]   Fax:  [phone]  Physical Therapy Treatment  Patient Details  Name: Teresa Soto MRN: [id number] Date of Birth: March 29, 1956  Encounter Date: 10/12/2014      PT End of Session - 10/12/14 1007    Visit Number 11   Number of Visits 18   Date for PT Re-Evaluation 10/09/14   Authorization Type BC[employer]   Authorization Time Period to 11/09/13   Authorization - Visit Number 11   Authorization - Number of Visits 18   PT Start Time 0932   PT Stop Time 1013   PT Time Calculation (min) 41 min   Activity Tolerance Patient tolerated treatment well   Behavior During Therapy [facility] for tasks assessed/performed      Past Medical History  Diagnosis Date  . Vertigo   . Hypertension   . Asthma   . Neuropathic pain   . Restless legs syndrome 2007 approx  . Depression   . Chronic back pain   . Stroke   . Spinal headache   . 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 Meniere disease     Past Surgical History  Procedure Laterality Date  . Back surgery    . Tonsillectomy    . Cesarean section      x 2  . Shoulder arthroscopy w/ rotator cuff repair      to right shoulder  . Tubal ligation    . Spine surgery  12/2010    ruptd L1 L2 , Dr Carloyn Manner  . Total knee arthroplasty Right 08/12/2014    Procedure: RIGHT TOTAL KNEE ARTHROPLASTY;  Surgeon: Carole Civil, MD;  Location: [facility];  Service: Orthopedics;  Laterality: Right;    There were no vitals taken for this visit.  Visit Diagnosis:  Right knee pain  Weakness of right lower extremity  Knee stiffness, right  S/P right knee arthroscopy      Subjective Assessment - 10/12/14 0946    Symptoms No complaints of pain, only "muscle tightness" in the knee.   Pt reports "i've been feeling better"          [facility] PT Assessment - 10/12/14 0936    Assessment   Medical Diagnosis Rt TKA   Onset Date 08/12/14   Next MD Visit  [name] 11/03/2014   Prior Therapy HHPT 3 weeks                  [facility] Adult PT Treatment/Exercise - 10/12/14 0936    Exercises   Exercises Knee/Hip   Knee/Hip Exercises: Stretches   Active Hamstring Stretch 3 reps;20 seconds   Active Hamstring Stretch Limitations 3 way 14in box   Quad Stretch 3 reps;30 seconds   Quad Stretch Limitations Prone with rope   Knee: Self-Stretch Limitations 10x 3 seconds on 14"    Gastroc Stretch 3 reps;30 seconds   Gastroc Stretch Limitations slant board   Knee/Hip Exercises: Aerobic   Stationary Bike 62min seat position 4   Knee/Hip Exercises: Standing   Knee Flexion 2 sets;15 reps;Right   Knee Flexion Limitations 5#   Forward Lunges 10 reps   Forward Lunges Limitations with 3 way UE reach 2" to floor   Lateral Step Up 2 sets;10 reps;Hand Hold: 0;Step Height: 8";Right   Stairs 7in stairs reciprocal pattern 3RT, 0 HHA; VC for controlled descent   SLS Single leg balance reach common 10x from 2" box, forward and posterior  directions progressed with UE reaching to improve weight loading    Gait Training Toe Taps 2" box x10   Other Standing Knee Exercises Monster Walk forward/backward RTB 30' RT                  PT Short Term Goals - 10/01/14 8676    PT SHORT TERM GOAL #1   Title Patient will be able to extend knee to <5 degrees from 0 to normalize stride   PT SHORT TERM GOAL #2   Title Patient will be able to flex Rt knee 130 degrees to squat through full depth   Status On-going   PT SHORT TERM GOAL #3   Title Patient will be able to stand and walk >33minutes to do grocery shopping with pain tolerable   Status On-going           PT Long Term Goals - 10/01/14 0953    PT LONG TERM GOAL #1   Title Patient will be able to fully extend Rt knee to 0 degrees to normalize gait   Status On-going   PT LONG TERM GOAL #2   Title Patient will be able to demonstrate Rt knee extension strength of 5/5 to be able to ambulate up and down  stairs with out hand held assist   Status On-going   PT LONG TERM GOAL #3   Title Patient will demonstrate bilateral Glute strength of 4/5 MMT to be able to stand up from the floor   Status On-going   PT LONG TERM GOAL #4   Title Patient will demonstrate decreased Rt knee pain so she can stand for 8 hours to return to work.    Status On-going               Plan - 10/12/14 1008    Clinical Impression Statement Pt reports she has been feeling "great" in the past week; as she has been able to sleep and has been able to decrease her dose on her mood medication.  "Muscle stretching" reproted during treatment session, though no complaints of pain.  noted improved stair climbing skills as pt able to ascend/descend with step through gait and without use of UE.  Pt reports no pain with ascending stairs today.  Noted decreased eccentric control with descending stairs, and heel taps added to address deficit.     Pt will benefit from skilled therapeutic intervention in order to improve on the following deficits Abnormal gait;Decreased strength;Pain;Difficulty walking;Decreased activity tolerance;Decreased balance;Decreased range of motion;Increased muscle spasms;Improper body mechanics;Decreased endurance;Impaired flexibility   Rehab Potential Good   PT Frequency 3x / week   PT Duration 6 weeks   PT Treatment/Interventions ADLs/Self Care Home Management;Therapeutic exercise;Balance training;Passive range of motion;Neuromuscular re-education;Gait training;Patient/family education;Stair training;Manual techniques;Functional mobility training;Therapeutic activities   PT Next Visit Plan Re-evaluation next visit. Possible decrease to 2x/wk until MD visit, where pt expects to be released to work at that time.         Problem List Patient Active Problem List   Diagnosis Date Noted  . Postoperative pulmonary edema 08/15/2014  . 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 Primary osteoarthritis of right  knee 07/27/2014  . OA (osteoarthritis) of knee 06/16/2014  . Idiopathic parathyroidism 06/16/2014  . Tinea versicolor 06/16/2014  . Obesity, unspecified 04/06/2013  . Lack of coordination 10/21/2012  . CVA (cerebral infarction) 10/10/2012  . Vitamin d deficiency 11/10/2011  . Hearing loss 06/29/2011  . Carotid bruit 06/29/2011  . BACK PAIN, LUMBAR,  WITH RADICULOPATHY 06/28/2010  . SCIATICA, LEFT 05/11/2010  . DEPRESSION 04/04/2010  . PERIPHERAL EDEMA 04/08/2009  . DEGENERATIVE JOINT DISEASE 03/23/2008  . HYPERLIPIDEMIA 11/18/2007  . INSOMNIA 10/17/2007  . ASTHMA 11/30/2006  . HYPERTENSION 09/05/2006  . ARTHRITIS 09/05/2006  . VERTIGO 09/05/2006   Lonna Cobb, DPT [phone]  [facility] [address] [place], [place], [address] Phone: [phone]   Fax:  [phone]

## 2014-10-14 ENCOUNTER — Ambulatory Visit (HOSPITAL_COMMUNITY)
Admission: RE | Admit: 2014-10-14 | Discharge: 2014-10-14 | Disposition: A | Discharge status: Home / Self Care | Payer: BLUE CROSS/BLUE SHIELD | Source: Ambulatory Visit | Attending: Family Medicine | Admitting: Family Medicine

## 2014-10-14 DIAGNOSIS — M25561 Pain in right knee: Secondary | ICD-10-CM

## 2014-10-14 DIAGNOSIS — Z9889 Other specified postprocedural states: Secondary | ICD-10-CM

## 2014-10-14 DIAGNOSIS — R29898 Other symptoms and signs involving the musculoskeletal system: Secondary | ICD-10-CM

## 2014-10-14 DIAGNOSIS — M25661 Stiffness of right knee, not elsewhere classified: Secondary | ICD-10-CM

## 2014-10-14 DIAGNOSIS — R279 Unspecified lack of coordination: Secondary | ICD-10-CM

## 2014-10-14 DIAGNOSIS — Z471 Aftercare following joint replacement surgery: Secondary | ICD-10-CM | POA: Diagnosis not present

## 2014-10-14 NOTE — Therapy (Signed)
[place], [place], [address] Phone: [phone]   Fax:  [phone]  Physical Therapy Reassessment/Treatment  Patient Details  Name: Teresa Soto MRN: [id number] Date of Birth: 11/18/1955  Encounter Date: 10/14/2014      PT End of Session - 10/14/14 1018    Visit Number 12   Number of Visits 18   Authorization Type BCBS   Authorization - Visit Number 12   Authorization - Number of Visits 18   PT Start Time 0930   PT Stop Time 1015   PT Time Calculation (min) 45 min   Activity Tolerance Patient tolerated treatment well   Behavior During Therapy [facility] for tasks assessed/performed      Past Medical History  Diagnosis Date  . Vertigo   . Hypertension   . Asthma   . Neuropathic pain   . Restless legs syndrome 2007 approx  . Depression   . Chronic back pain   . Stroke   . Spinal headache   . 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 Meniere disease     Past Surgical History  Procedure Laterality Date  . Back surgery    . Tonsillectomy    . Cesarean section      x 2  . Shoulder arthroscopy w/ rotator cuff repair      to right shoulder  . Tubal ligation    . Spine surgery  12/2010    ruptd L1 L2 , Dr Carloyn Manner  . Total knee arthroplasty Right 08/12/2014    Procedure: RIGHT TOTAL KNEE ARTHROPLASTY;  Surgeon: Carole Civil, MD;  Location: [facility];  Service: Orthopedics;  Laterality: Right;    There were no vitals taken for this visit.  Visit Diagnosis:  Right knee pain  Weakness of right lower extremity  Knee stiffness, right  S/P right knee arthroscopy  Lack of coordination      Subjective Assessment - 10/14/14 0939    Symptoms Patient is very satiosfied with progress made thus far. no complaint of pain notes mild discomfort in Rt anterior knee.    Currently in Pain? No/denies   Pain Score 0-No pain          [facility] PT Assessment - 10/14/14 0001    Assessment   Medical Diagnosis Rt TKA   Onset Date 08/12/14   Next MD Visit [name] 11/03/2014   Prior Therapy HHPT 3 weeks   Observation/Other Assessments   Focus on Therapeutic Outcomes (FOTO)  1% limited was 53% limited   Sit to Stand   Comments no hands, no pain.    Other:   Other/ Comments Pain with stairs and kneeling.    AROM   Right Knee Extension 0   Right Knee Flexion 123   Strength   Right Hip Extension 4/5   Right Hip ABduction 4/5   Right Knee Flexion 5/5   Right Knee Extension 4/5   Left Knee Flexion 5/5   Left Knee Extension 5/5   Right Ankle Dorsiflexion 5/5   Flexibility   Hamstrings WNL   Quadriceps Rt 5in, Lt 4in to glute   Static Standing Balance   Static Standing - Comment/# of Minutes >30 seconds each            [facility] Adult PT Treatment/Exercise - 10/14/14 0001    Knee/Hip Exercises: Stretches   Active Hamstring Stretch 3 reps;20 seconds   Active Hamstring Stretch Limitations 3 way 14in box   Quad Stretch 3  reps;30 seconds   Quad Stretch Limitations Prone with rope   Knee: Self-Stretch Limitations 10x 3 seconds on 14"    Gastroc Stretch 3 reps;30 seconds   Gastroc Stretch Limitations slant board   Knee/Hip Exercises: Aerobic   Stationary Bike 58minutes Nu-step   Knee/Hip Exercises: Standing   Lateral Step Up Limitations 3D step ups to 6"  10x with forward trunk lean   SLS Single leg bal;ance reach matrix 2 and 4 inch with same side rotation and forward reach           PT Education - 10/14/14 1017    Education provided Yes   Education Details HEP: 3D stp ups and Single leg balance reach matrix   Person(s) Educated Patient   Methods Explanation;Demonstration;Handout   Comprehension Verbalized understanding;Returned demonstration          PT Short Term Goals - 10/14/14 1019    PT SHORT TERM GOAL #1   Title Patient will be able to extend knee to <5 degrees from 0 to normalize stride   Status Achieved   PT SHORT TERM GOAL #2   Title Patient will be able to flex Rt knee  120 degrees to squat through full depth   Status Achieved   PT SHORT TERM GOAL #3   Title Patient will be able to stand and walk >52minutes to do grocery shopping with pain tolerable   Status Achieved           PT Long Term Goals - 10/14/14 1020    PT LONG TERM GOAL #1   Title Patient will be able to fully extend Rt knee to 0 degrees to normalize gait   Status Achieved   PT LONG TERM GOAL #2   Title Patient will be able to demonstrate Rt knee extension strength of 5/5 to be able to ambulate up and down stairs with out hand held assist   Status Partially Met   PT LONG TERM GOAL #3   Title Patient will demonstrate bilateral Glute strength of 4/5 MMT to be able to stand up from the floor   Status Achieved   PT LONG TERM GOAL #4   Title Patient will demonstrate decreased Rt knee pain so she can stand for 8 hours to return to work.    Status Achieved               Plan - 10/14/14 1018    Clinical Impression Statement Patient states readiness to discharge and has met all goals. only difficulty si with going up and down stairs secondary to painin anterior knee, that has much improved and is excpected to contineu to imrpove with increased time of healing and perofrmance of HEP.  Patient is independent with HEP and has met all goals.    PT Next Visit Plan Patient discharged         Problem List Patient Active Problem List   Diagnosis Date Noted  . Postoperative pulmonary edema 08/15/2014  . 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 Primary osteoarthritis of right knee 07/27/2014  . OA (osteoarthritis) of knee 06/16/2014  . Idiopathic parathyroidism 06/16/2014  . Tinea versicolor 06/16/2014  . Obesity, unspecified 04/06/2013  . Lack of coordination 10/21/2012  . CVA (cerebral infarction) 10/10/2012  . Vitamin d deficiency 11/10/2011  . Hearing loss 06/29/2011  . Carotid bruit 06/29/2011  . BACK PAIN, LUMBAR, WITH RADICULOPATHY 06/28/2010  . SCIATICA, LEFT 05/11/2010  .  DEPRESSION 04/04/2010  . PERIPHERAL EDEMA 04/08/2009  . DEGENERATIVE  JOINT DISEASE 03/23/2008  . HYPERLIPIDEMIA 11/18/2007  . INSOMNIA 10/17/2007  . ASTHMA 11/30/2006  . HYPERTENSION 09/05/2006  . ARTHRITIS 09/05/2006  . VERTIGO 09/05/2006    Shylin Keizer R 10/14/2014, 11:01 AM  [facility] [address] [place], [place], [address] Phone: [phone]   Fax:  [phone]    PHYSICAL THERAPY DISCHARGE SUMMARY  Visits from Start of Care: 12  Current functional level related to goals / functional outcomes: Goals met  Remaining deficits: Paint with ambulation up and down 8" steps    Plan: Patient agrees to discharge.  Patient goals were met. Patient is being discharged due to meeting the stated rehab goals.  ?????        Devona Konig PT DPT [phone]

## 2014-10-16 ENCOUNTER — Ambulatory Visit (HOSPITAL_COMMUNITY): Payer: BLUE CROSS/BLUE SHIELD

## 2014-10-19 ENCOUNTER — Ambulatory Visit (HOSPITAL_COMMUNITY): Payer: BLUE CROSS/BLUE SHIELD | Admitting: Physical Therapy

## 2014-10-21 ENCOUNTER — Encounter (HOSPITAL_COMMUNITY): Payer: BC Managed Care – PPO | Admitting: Physical Therapy

## 2014-10-23 ENCOUNTER — Encounter (HOSPITAL_COMMUNITY): Payer: BC Managed Care – PPO

## 2014-10-26 ENCOUNTER — Encounter (HOSPITAL_COMMUNITY): Payer: BC Managed Care – PPO | Admitting: Physical Therapy

## 2014-10-28 ENCOUNTER — Encounter (HOSPITAL_COMMUNITY): Payer: BC Managed Care – PPO

## 2014-10-30 ENCOUNTER — Encounter (HOSPITAL_COMMUNITY): Payer: BC Managed Care – PPO | Admitting: Physical Therapy

## 2014-11-02 ENCOUNTER — Encounter: Payer: Self-pay | Admitting: Family Medicine

## 2014-11-02 ENCOUNTER — Encounter (HOSPITAL_COMMUNITY): Payer: BC Managed Care – PPO | Admitting: Physical Therapy

## 2014-11-03 ENCOUNTER — Ambulatory Visit (INDEPENDENT_AMBULATORY_CARE_PROVIDER_SITE_OTHER): Payer: BLUE CROSS/BLUE SHIELD | Admitting: Orthopedic Surgery

## 2014-11-03 ENCOUNTER — Encounter: Payer: Self-pay | Admitting: Orthopedic Surgery

## 2014-11-03 VITALS — BP 145/90 | Ht 61.0 in | Wt 160.0 lb

## 2014-11-03 DIAGNOSIS — Z96651 Presence of right artificial knee joint: Secondary | ICD-10-CM

## 2014-11-03 DIAGNOSIS — M1711 Unilateral primary osteoarthritis, right knee: Secondary | ICD-10-CM

## 2014-11-03 NOTE — Progress Notes (Signed)
Chief Complaint  Patient presents with  . Follow-up    follow up Right TKA, DOS 08/12/14    12 week follow-up right total knee. Doing well ready to go back to work.  She has a couple of complaints she has pain when she goes down the steps at the quadriceps tendon. Is a little bit of pain when she squats and kneels and gets back up at the same area.  She says that her right leg is much bigger than her left but I measured it and she has a 20 inch 5 bilateral and a 15-1/2 inch calf mid-level bilateral.  Knee flexion good knee extension good  Ambulates without assistive device  Okay to return to work follow-up 3 months  Addendum she also has some tenderness in her lower back and I think it's causing some of her radicular-like symptoms although she reports this as knee pain.

## 2014-11-03 NOTE — Patient Instructions (Signed)
Return to work  

## 2014-11-04 ENCOUNTER — Encounter (HOSPITAL_COMMUNITY): Payer: BC Managed Care – PPO | Admitting: Physical Therapy

## 2014-11-06 ENCOUNTER — Encounter (HOSPITAL_COMMUNITY): Payer: BC Managed Care – PPO

## 2014-11-23 ENCOUNTER — Other Ambulatory Visit: Payer: Self-pay | Admitting: Family Medicine

## 2014-11-24 NOTE — Telephone Encounter (Signed)
Medication refilled per protocol. 

## 2014-12-15 ENCOUNTER — Ambulatory Visit: Payer: BC Managed Care – PPO | Admitting: Family Medicine

## 2014-12-22 ENCOUNTER — Other Ambulatory Visit: Payer: Self-pay | Admitting: Family Medicine

## 2015-01-07 ENCOUNTER — Encounter: Payer: Self-pay | Admitting: Family Medicine

## 2015-01-16 ENCOUNTER — Other Ambulatory Visit: Payer: Self-pay | Admitting: Family Medicine

## 2015-01-18 NOTE — Telephone Encounter (Signed)
Medication refilled per protocol. 

## 2015-01-25 ENCOUNTER — Ambulatory Visit (INDEPENDENT_AMBULATORY_CARE_PROVIDER_SITE_OTHER): Payer: BLUE CROSS/BLUE SHIELD | Admitting: Family Medicine

## 2015-01-25 ENCOUNTER — Encounter: Payer: Self-pay | Admitting: Family Medicine

## 2015-01-25 VITALS — BP 130/78 | HR 74 | Temp 99.3°F | Resp 12 | Ht 61.0 in | Wt 165.0 lb

## 2015-01-25 DIAGNOSIS — E209 Hypoparathyroidism, unspecified: Secondary | ICD-10-CM

## 2015-01-25 DIAGNOSIS — E559 Vitamin D deficiency, unspecified: Secondary | ICD-10-CM

## 2015-01-25 DIAGNOSIS — I10 Essential (primary) hypertension: Secondary | ICD-10-CM | POA: Diagnosis not present

## 2015-01-25 DIAGNOSIS — F330 Major depressive disorder, recurrent, mild: Secondary | ICD-10-CM

## 2015-01-25 DIAGNOSIS — E785 Hyperlipidemia, unspecified: Secondary | ICD-10-CM | POA: Diagnosis not present

## 2015-01-25 DIAGNOSIS — J4520 Mild intermittent asthma, uncomplicated: Secondary | ICD-10-CM | POA: Diagnosis not present

## 2015-01-25 NOTE — Assessment & Plan Note (Signed)
She has over-the-counter vitamin D she's been taking 5000 unit perhaps to equal her 50,000 units every weekly for 12 weeks when she completes that she will then take 5000 once a week

## 2015-01-25 NOTE — Assessment & Plan Note (Signed)
Plan for labs whenever her insurance kicks in in the next 3 months. She is currently stable with regards to the parathyroid she has not had any symptoms

## 2015-01-25 NOTE — Assessment & Plan Note (Signed)
Asthma is currently well controlled continue Singulair and albuterol as needed

## 2015-01-25 NOTE — Assessment & Plan Note (Signed)
Blood pressure is well-controlled attention medication

## 2015-01-25 NOTE — Progress Notes (Signed)
Patient ID: Teresa Soto, female   DOB: [birth year], 59 y.o.   MRN: [id number]   Subjective:    Patient ID: Teresa Soto, female    DOB: 09/26/1956, 59 y.o.   MRN: [id number]  Patient presents for 6 month F/U patient here to follow chronic medical problems. She has no particular concerns today. Her husband changed jobs therefore her insurance terminated and she is pain out of pocket for her medications at this time. She was to return once her new insurance has kicked in to have her blood work done for hypertension hyperparathyroidism and vitamin D deficiency. She is status post knee replacement on the right side which she has done fairly well with. Regarding her asthma this is been fairly well-controlled she uses albuterol as well as Singulair on a daily basis.    Review Of Systems:  GEN- denies fatigue, fever, weight loss,weakness, recent illness HEENT- denies eye drainage, change in vision, nasal discharge, CVS- denies chest pain, palpitations RESP- denies SOB, cough, wheeze ABD- denies N/V, change in stools, abd pain GU- denies dysuria, hematuria, dribbling, incontinence MSK- denies+denies headache, dizziness, syncope, seizure activity       Objective:    BP 130/78 mmHg  Pulse 74  Temp(Src) 99.3 F (37.4 C) (Oral)  Resp 12  Ht 5\' 1"  (1.549 m)  Wt 165 lb (74.844 kg)  BMI 31.19 kg/m2 GEN- NAD, alert and oriented x3 HEENT- PERRL, EOMI, non injected sclera, pink conjunctiva, MMM, oropharynx clear Neck- Supple, no thyromegaly CVS- RRR, no murmur RESP-CTAB EXT- No edema Pulses- Radial, DP- 2+        Assessment & Plan:      Problem List Items Addressed This Visit    Vitamin D deficiency   Major depression   Idiopathic parathyroidism   Relevant Orders   PTH, Intact and Calcium   Hyperlipidemia   Relevant Orders   Lipid panel   Essential hypertension   Relevant Orders   CBC with Differential/Platelet   Comprehensive metabolic panel   Lipid panel   TSH   Asthma  - Primary      Note: This dictation was prepared with Dragon dictation along with smaller phrase technology. Any transcriptional errors that result from this process are unintentional.

## 2015-01-25 NOTE — Patient Instructions (Addendum)
Take a total of 50,0000IU for 12 weeks, then take the 5,000 units once a week  Continue all medications  Plan for labs at our next visit F/U 6 Months

## 2015-01-25 NOTE — Assessment & Plan Note (Signed)
She is currently taking 10 mg of Lexapro and doing well with this.

## 2015-01-25 NOTE — Assessment & Plan Note (Signed)
>>  ASSESSMENT AND PLAN FOR MAJOR DEPRESSION WRITTEN ON 01/25/2015  1:00 PM BY BARI REA F  She is currently taking 10 mg of Lexapro  and doing well with this.

## 2015-02-02 ENCOUNTER — Other Ambulatory Visit: Payer: Self-pay | Admitting: Family Medicine

## 2015-02-02 ENCOUNTER — Ambulatory Visit: Payer: BLUE CROSS/BLUE SHIELD | Admitting: Orthopedic Surgery

## 2015-03-30 ENCOUNTER — Other Ambulatory Visit: Payer: Self-pay | Admitting: Family Medicine

## 2015-03-30 NOTE — Telephone Encounter (Signed)
Refill appropriate and filled per protocol. 

## 2015-05-16 ENCOUNTER — Other Ambulatory Visit: Payer: Self-pay | Admitting: Family Medicine

## 2015-05-17 NOTE — Telephone Encounter (Signed)
Refill appropriate and filled per protocol. 

## 2015-06-22 ENCOUNTER — Other Ambulatory Visit: Payer: Self-pay | Admitting: Family Medicine

## 2015-06-22 NOTE — Telephone Encounter (Signed)
Medication refilled per protocol. 

## 2015-07-28 ENCOUNTER — Encounter: Payer: Self-pay | Admitting: Family Medicine

## 2015-07-28 ENCOUNTER — Ambulatory Visit (INDEPENDENT_AMBULATORY_CARE_PROVIDER_SITE_OTHER): Payer: BLUE CROSS/BLUE SHIELD | Admitting: Family Medicine

## 2015-07-28 VITALS — BP 130/72 | HR 82 | Temp 98.2°F | Resp 12 | Ht 61.0 in | Wt 167.0 lb

## 2015-07-28 DIAGNOSIS — E559 Vitamin D deficiency, unspecified: Secondary | ICD-10-CM

## 2015-07-28 DIAGNOSIS — I10 Essential (primary) hypertension: Secondary | ICD-10-CM | POA: Diagnosis not present

## 2015-07-28 DIAGNOSIS — E209 Hypoparathyroidism, unspecified: Secondary | ICD-10-CM

## 2015-07-28 DIAGNOSIS — R279 Unspecified lack of coordination: Secondary | ICD-10-CM

## 2015-07-28 DIAGNOSIS — J4520 Mild intermittent asthma, uncomplicated: Secondary | ICD-10-CM

## 2015-07-28 DIAGNOSIS — E785 Hyperlipidemia, unspecified: Secondary | ICD-10-CM | POA: Diagnosis not present

## 2015-07-28 DIAGNOSIS — I6350 Cerebral infarction due to unspecified occlusion or stenosis of unspecified cerebral artery: Secondary | ICD-10-CM | POA: Diagnosis not present

## 2015-07-28 MED ORDER — ONDANSETRON HCL 4 MG PO TABS: 4.0000 mg | ORAL_TABLET | Freq: Four times a day (QID) | ORAL | Status: DC

## 2015-07-28 MED ORDER — ALBUTEROL SULFATE 108 (90 BASE) MCG/ACT IN AEPB: 2.0000 | INHALATION_SPRAY | RESPIRATORY_TRACT | Status: DC | PRN

## 2015-07-28 NOTE — Progress Notes (Signed)
Patient ID: Teresa Soto, female   DOB: Aug 11, 1956, 59 y.o.   MRN: [id number]   Subjective:    Patient ID: Teresa Soto, female    DOB: 1956-05-17, 59 y.o.   MRN: [id number]  Patient presents for 6 months F/U  here to follow-up medications. She has been noticing some neurological changes over the past few months which have been worsening. She has problems with her balance she has problems with the touching feeling in her fingertips which caused some difficulty at work she also noticed increased memory problems. She does not remember any particular event when this happened but she does have history of cerebrovascular accident. She has been taking all of her medications as prescribed she's not had new labs since January because of a lapse in her insurance. She denies any chest pain or shortness of breath.  She does have history of asthma and requests a refill on her inhaler for the winter months. She declines flu shot.  She's not had new problems with her vertigo. Next  Hypertension she is taking her medicines as prescribed and blood pressure has been good.    Review Of Systems: Per above  GEN- denies fatigue, fever, weight loss,+weakness, recent illness HEENT- denies eye drainage, change in vision, nasal discharge, CVS- denies chest pain, palpitations RESP- denies SOB, cough, wheeze ABD- denies N/V, change in stools, abd pain GU- denies dysuria, hematuria, dribbling, incontinence MSK- denies joint pain, muscle aches, injury Neuro- denies headache, +dizziness, syncope, seizure activity       Objective:    BP 130/72 mmHg  Pulse 82  Temp(Src) 98.2 F (36.8 C) (Oral)  Resp 12  Ht 5\' 1"  (1.549 m)  Wt 167 lb (75.751 kg)  BMI 31.57 kg/m2 GEN- NAD, alert and oriented x3 HEENT- PERRL, EOMI, non injected sclera, pink conjunctiva, MMM, oropharynx clear Neck- Supple, no thyromegaly CVS- RRR, no murmur RESP-CTAB Neuro- CNII-XII intact, mild decreased sensivity, gait a little unsteady  ( also recent rehab to knee), no gross new focal defiicts EXT- No edema Pulses- Radial - 2+        Assessment & Plan:      Problem List Items Addressed This Visit    Vitamin D deficiency   Relevant Orders   Vitamin D, 25-hydroxy   Lack of coordination   Relevant Orders   MR Brain Wo Contrast   Idiopathic parathyroidism (HCC)    Recheck PTH levels      Relevant Orders   TSH   PTH, Intact and Calcium   Hyperlipidemia   Relevant Orders   Lipid panel   Essential hypertension - Primary    Well controlled, no changes      Relevant Orders   CBC with Differential/Platelet   Comprehensive metabolic panel   TSH   CVA (cerebral infarction)    Concerned she may have had another cerbrovascular event with her recurrance and worsening symptoms, will check labs, obtain MRI brain       Relevant Orders   MR Brain Wo Contrast   Asthma    Given Proair Respiclick  Declines flu shot  Continue singulair      Relevant Medications   Albuterol Sulfate (PROAIR RESPICLICK) 974 (90 BASE) MCG/ACT AEPB      Note: This dictation was prepared with Dragon dictation along with smaller phrase technology. Any transcriptional errors that result from this process are unintentional.

## 2015-07-28 NOTE — Patient Instructions (Signed)
MRI of brain to be done Get labs done fasting  Proair as needed  F/U 4 months

## 2015-07-29 ENCOUNTER — Ambulatory Visit (HOSPITAL_COMMUNITY)
Admission: RE | Admit: 2015-07-29 | Discharge: 2015-07-29 | Disposition: A | Discharge status: Home / Self Care | Payer: BLUE CROSS/BLUE SHIELD | Source: Ambulatory Visit | Attending: Family Medicine | Admitting: Family Medicine

## 2015-07-29 ENCOUNTER — Encounter: Payer: Self-pay | Admitting: Family Medicine

## 2015-07-29 DIAGNOSIS — R279 Unspecified lack of coordination: Secondary | ICD-10-CM | POA: Insufficient documentation

## 2015-07-29 DIAGNOSIS — I639 Cerebral infarction, unspecified: Secondary | ICD-10-CM | POA: Insufficient documentation

## 2015-07-29 DIAGNOSIS — I6350 Cerebral infarction due to unspecified occlusion or stenosis of unspecified cerebral artery: Secondary | ICD-10-CM

## 2015-07-29 NOTE — Assessment & Plan Note (Signed)
Recheck PTH levels

## 2015-07-29 NOTE — Assessment & Plan Note (Signed)
Well controlled, no changes 

## 2015-07-29 NOTE — Assessment & Plan Note (Signed)
Given Proair Respiclick  Declines flu shot  Continue singulair

## 2015-07-29 NOTE — Assessment & Plan Note (Signed)
Concerned she may have had another cerbrovascular event with her recurrance and worsening symptoms, will check labs, obtain MRI brain

## 2015-07-30 ENCOUNTER — Other Ambulatory Visit: Payer: Self-pay | Admitting: *Deleted

## 2015-07-30 DIAGNOSIS — I6350 Cerebral infarction due to unspecified occlusion or stenosis of unspecified cerebral artery: Secondary | ICD-10-CM

## 2015-07-30 DIAGNOSIS — R279 Unspecified lack of coordination: Secondary | ICD-10-CM

## 2015-07-30 LAB — COMPREHENSIVE METABOLIC PANEL
ALBUMIN: 3.8 g/dL (ref 3.6–5.1)
ALK PHOS: 94 U/L (ref 33–130)
ALT: 11 U/L (ref 6–29)
AST: 12 U/L (ref 10–35)
BUN: 16 mg/dL (ref 7–25)
CALCIUM: 11.2 mg/dL — AB (ref 8.6–10.4)
CHLORIDE: 108 mmol/L (ref 98–110)
CO2: 25 mmol/L (ref 20–31)
Creat: 0.77 mg/dL (ref 0.50–1.05)
Glucose, Bld: 104 mg/dL — ABNORMAL HIGH (ref 70–99)
POTASSIUM: 4.6 mmol/L (ref 3.5–5.3)
Sodium: 140 mmol/L (ref 135–146)
TOTAL PROTEIN: 6.7 g/dL (ref 6.1–8.1)
Total Bilirubin: 0.5 mg/dL (ref 0.2–1.2)

## 2015-07-30 LAB — CBC WITH DIFFERENTIAL/PLATELET
BASOS ABS: 0.1 10*3/uL (ref 0.0–0.1)
Basophils Relative: 1 % (ref 0–1)
EOS PCT: 2 % (ref 0–5)
Eosinophils Absolute: 0.1 10*3/uL (ref 0.0–0.7)
HCT: 39.7 % (ref 36.0–46.0)
Hemoglobin: 13.6 g/dL (ref 12.0–15.0)
LYMPHS ABS: 2.2 10*3/uL (ref 0.7–4.0)
LYMPHS PCT: 40 % (ref 12–46)
MCH: 29.3 pg (ref 26.0–34.0)
MCHC: 34.3 g/dL (ref 30.0–36.0)
MCV: 85.6 fL (ref 78.0–100.0)
MPV: 9.6 fL (ref 8.6–12.4)
Monocytes Absolute: 0.4 10*3/uL (ref 0.1–1.0)
Monocytes Relative: 7 % (ref 3–12)
Neutro Abs: 2.8 10*3/uL (ref 1.7–7.7)
Neutrophils Relative %: 50 % (ref 43–77)
PLATELETS: 368 10*3/uL (ref 150–400)
RBC: 4.64 MIL/uL (ref 3.87–5.11)
RDW: 14.2 % (ref 11.5–15.5)
WBC: 5.6 10*3/uL (ref 4.0–10.5)

## 2015-07-30 LAB — LIPID PANEL
CHOL/HDL RATIO: 6.3 ratio — AB (ref <=5.0)
CHOLESTEROL: 219 mg/dL — AB (ref 125–200)
HDL: 35 mg/dL — AB (ref >=46)
LDL Cholesterol: 153 mg/dL — ABNORMAL HIGH (ref <130)
TRIGLYCERIDES: 156 mg/dL — AB (ref <150)
VLDL: 31 mg/dL — AB (ref <30)

## 2015-07-30 LAB — TSH: TSH: 0.798 u[IU]/mL (ref 0.350–4.500)

## 2015-07-31 LAB — VITAMIN D 25 HYDROXY (VIT D DEFICIENCY, FRACTURES): Vit D, 25-Hydroxy: 16 ng/mL — ABNORMAL LOW (ref 30–100)

## 2015-08-02 LAB — PTH, INTACT AND CALCIUM
Calcium: 11.8 mg/dL — ABNORMAL HIGH (ref 8.4–10.5)
PTH: 201 pg/mL — ABNORMAL HIGH (ref 14–64)

## 2015-08-09 ENCOUNTER — Ambulatory Visit (HOSPITAL_COMMUNITY): Payer: BLUE CROSS/BLUE SHIELD | Attending: Family Medicine | Admitting: Physical Therapy

## 2015-08-09 DIAGNOSIS — R29898 Other symptoms and signs involving the musculoskeletal system: Secondary | ICD-10-CM | POA: Insufficient documentation

## 2015-08-09 DIAGNOSIS — R2681 Unsteadiness on feet: Secondary | ICD-10-CM | POA: Insufficient documentation

## 2015-08-09 DIAGNOSIS — Z7409 Other reduced mobility: Secondary | ICD-10-CM | POA: Diagnosis present

## 2015-08-09 NOTE — Patient Instructions (Signed)
Without Support    Stand on one leg in neutral spine without support. Hold _15-30___ seconds. Repeat on other leg. Do __3__ repetitions, __1__ sets.  http://bt.exer.us/36   Copyright  VHI. All rights reserved.  Mini-Squats (Standing)    Stand with support. Bend knees slightly. Return to straight standing.  Repeat _10__ times. Do _1__ times a day.  Copyright  VHI. All rights reserved.

## 2015-08-10 ENCOUNTER — Encounter: Payer: Self-pay | Admitting: *Deleted

## 2015-08-10 NOTE — Therapy (Signed)
[facility] [address] [place], [place], [address] Phone: [phone]   Fax:  [phone]  Physical Therapy Evaluation  Patient Details  Name: Teresa Soto MRN: [id number] Date of Birth: 1956/04/10 Referring Provider: Raliegh Ip. [place]  Encounter Date: 08/09/2015      PT End of Session - 08/09/15 1757    Visit Number 1   Number of Visits 8   Date for PT Re-Evaluation 09/08/15   Authorization Type BCBS   Authorization Time Period 08/09/15-10/09/15   PT Start Time 1517   PT Stop Time 1600   PT Time Calculation (min) 43 min   Activity Tolerance Patient tolerated treatment well   Behavior During Therapy [facility] for tasks assessed/performed      Past Medical History  Diagnosis Date  . Vertigo   . Hypertension   . Asthma   . Neuropathic pain   . Restless legs syndrome 2007 approx  . Depression   . Chronic back pain   . Stroke ([place])   . Spinal headache   . 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 Meniere disease     Past Surgical History  Procedure Laterality Date  . Back surgery    . Tonsillectomy    . Cesarean section      x 2  . Shoulder arthroscopy w/ rotator cuff repair      to right shoulder  . Tubal ligation    . Spine surgery  12/2010    ruptd L1 L2 , Dr Carloyn Manner  . Total knee arthroplasty Right 08/12/2014    Procedure: RIGHT TOTAL KNEE ARTHROPLASTY;  Surgeon: Carole Civil, MD;  Location: [facility];  Service: Orthopedics;  Laterality: Right;    There were no vitals filed for this visit.  Visit Diagnosis:  Weakness of both legs  Unsteadiness  Decreased functional mobility and endurance      Subjective Assessment - 08/09/15 1522    Subjective Pt reports that she has returned to PT after her coordination and balance have gotten worse. Pt had a stroke in 2013, and her MD was worried that she may be having mini strokes, however, imaging ruled this out. Pt reports that she has difficulty with shifting her weight while at  work or while getting up and down from chairs or the floor, and she loses her balance often. She reports that she is having numbness and lack of coordination in her hands, and that is her biggest concern at this time along with her lack of balance.    How long can you stand comfortably? has to stand for 6+ hours at work   Patient Stated Goals Be able to manage her impairments   Currently in Pain? No/denies            [facility] PT Assessment - 08/09/15 0001    Assessment   Medical Diagnosis Lack of coordination   Referring Provider K. Ellis   Next MD Visit 4 months   Prior Therapy not for balance   Balance Screen   Has the patient fallen in the past 6 months Yes   How many times? 1   Has the patient had a decrease in activity level because of a fear of falling?  No   Is the patient reluctant to leave their home because of a fear of falling?  No   Home Environment   Living Environment Private residence   Living Arrangements Spouse/significant other   Type of [place] Access  Stairs to enter   [employer] of Steps 4   Entrance Stairs-Rails Right;Left   Home Layout Two level;Bed/bath upstairs   Alternate Level Stairs-Number of Steps 15   Alternate Level Stairs-Rails Right;Left   Prior Function   Level of Independence Independent   Vocation Part time employment   Biomedical scientist Works as Scientist, water quality at [employer] Enjoys reading   Observation/Other Assessments   Focus on Therapeutic Outcomes (FOTO)  21% limited   Functional Tests   Functional tests Floor to Stand   Floor to Stand   Comments pt with impaired balance when returning from kneeling position   ROM / Strength   AROM / PROM / Strength Strength   Strength   Strength Assessment Site Hip;Knee   Right/Left Hip Right;Left   Right Hip Flexion 4/5   Right Hip Extension 4-/5   Right Hip ABduction 4-/5   Left Hip Flexion 4/5   Left Hip Extension 4-/5   Left Hip ABduction 4/5   Right/Left Knee  Right;Left   Right Knee Flexion 4/5   Right Knee Extension 4+/5   Left Knee Flexion 4+/5   Left Knee Extension 4+/5   Transfers   Five time sit to stand comments  11.86 seconds   Ambulation/Gait   Gait Comments TUG 8.02   Standardized Balance Assessment   Standardized Balance Assessment Berg Balance Test   Berg Balance Test   Sit to Stand Able to stand without using hands and stabilize independently   Standing Unsupported Able to stand safely 2 minutes   Sitting with Back Unsupported but Feet Supported on Floor or Stool Able to sit safely and securely 2 minutes   Stand to Sit Controls descent by using hands   Transfers Able to transfer safely, minor use of hands   Standing Unsupported with Eyes Closed Able to stand 10 seconds safely   Standing Ubsupported with Feet Together Able to place feet together independently and stand 1 minute safely   From Standing, Reach Forward with Outstretched Arm Can reach forward >12 cm safely (5")   From Standing Position, Pick up Object from Floor Able to pick up shoe, needs supervision   From Standing Position, Turn to Look Behind Over each Shoulder Looks behind from both sides and weight shifts well   Turn 360 Degrees Able to turn 360 degrees safely in 4 seconds or less   Standing Unsupported, Alternately Place Feet on Step/Stool Able to stand independently and complete 8 steps >20 seconds   Standing Unsupported, One Foot in Front Able to place foot tandem independently and hold 30 seconds   Standing on One Leg Able to lift leg independently and hold > 10 seconds   Total Score 52                 PT Education - 08/09/15 1757    Education provided Yes   Education Details HEP   Person(s) Educated Patient   Methods Explanation;Handout   Comprehension Verbalized understanding;Returned demonstration          PT Short Term Goals - 08/10/15 0738    PT SHORT TERM GOAL #1   Title Pt will be independent with HEP for BLE strengthening and  balance training.   Time 2   Period Weeks   Status New   PT SHORT TERM GOAL #2   Title Improve bilateral hip extensor strength to 4/5 or greater to improve functional mobility.   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    Time 2   Period Weeks   Status New           PT Long Term Goals - 08/10/15 0740    PT LONG TERM GOAL #1   Title Pt will be independent with advanced HEP for BLE strengthening and balance, to be updated PRN.    Time 4   Period Weeks   Status New   PT LONG TERM GOAL #2   Title Improve bilateral hip strength to 4+/5 to improve stability and functional mobility.   Time 4   Period Weeks   Status New   PT LONG TERM GOAL #3   Title Pt will complete floor to stand transfers x 5 with proper form and no LOB to allow her to pick things up off of the floor at work with decreased risk for falls.    Time 4   Period Weeks   Status New               Plan - 08/09/15 1758    Clinical Impression Statement Pt presents to PT with c/o impaired balance and difficulty with completing work-related tasks. Pt demonstrates decreased BLE strength, impaired functional mobility, and difficulty completing high-level balance activities. Pt will benefit from skilled physical therapy to improve balance and decrease risk for falls to allow her to return to PLOF of working without difficulty. Pt being referred to OT to address pt's reports of impaired coordination in hands.   Pt will benefit from skilled therapeutic intervention in order to improve on the following deficits Decreased balance;Decreased mobility;Decreased strength;Difficulty walking   Rehab Potential Good   PT Frequency 2x / week   PT Duration 4 weeks   PT Treatment/Interventions ADLs/Self Care Home Management;Gait training;Stair training;Functional  mobility training;Therapeutic activities;Therapeutic exercise;Balance training;Neuromuscular re-education;Patient/family education   PT Next Visit Plan Review HEP, begin high level balance training and proper squatting/lifting   PT Home Exercise Plan HEP         Problem List Patient Active Problem List   Diagnosis Date Noted  . Postoperative pulmonary edema ([place]) 08/15/2014  . 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 Primary osteoarthritis of right knee 07/27/2014  . OA (osteoarthritis) of knee 06/16/2014  . Idiopathic parathyroidism ([place]) 06/16/2014  . Tinea versicolor 06/16/2014  . Obesity, unspecified 04/06/2013  . Lack of coordination 10/21/2012  . CVA (cerebral infarction) 10/10/2012  . Vitamin D deficiency 11/10/2011  . Hearing loss 06/29/2011  . Carotid bruit 06/29/2011  . BACK PAIN, LUMBAR, WITH RADICULOPATHY 06/28/2010  . SCIATICA, LEFT 05/11/2010  . Major depression ([place]) 04/04/2010  . PERIPHERAL EDEMA 04/08/2009  . DEGENERATIVE JOINT DISEASE 03/23/2008  . Hyperlipidemia 11/18/2007  . INSOMNIA 10/17/2007  . Asthma 11/30/2006  . Essential hypertension 09/05/2006  . ARTHRITIS 09/05/2006  . VERTIGO 09/05/2006    Hilma Favors, PT, DPT [phone] 08/10/2015, 7:43 AM  [facility] [place], [place], [address] Phone: [phone]   Fax:  [phone]  Name: Teresa Soto MRN: [id number] Date of Birth: [birth year]

## 2015-08-11 ENCOUNTER — Ambulatory Visit (HOSPITAL_COMMUNITY): Payer: BLUE CROSS/BLUE SHIELD | Attending: Family Medicine | Admitting: Physical Therapy

## 2015-08-11 DIAGNOSIS — M25561 Pain in right knee: Secondary | ICD-10-CM | POA: Diagnosis present

## 2015-08-11 DIAGNOSIS — Z9889 Other specified postprocedural states: Secondary | ICD-10-CM | POA: Diagnosis present

## 2015-08-11 DIAGNOSIS — R29898 Other symptoms and signs involving the musculoskeletal system: Secondary | ICD-10-CM | POA: Insufficient documentation

## 2015-08-11 DIAGNOSIS — R2681 Unsteadiness on feet: Secondary | ICD-10-CM | POA: Insufficient documentation

## 2015-08-11 DIAGNOSIS — M25661 Stiffness of right knee, not elsewhere classified: Secondary | ICD-10-CM | POA: Insufficient documentation

## 2015-08-11 DIAGNOSIS — R279 Unspecified lack of coordination: Secondary | ICD-10-CM | POA: Diagnosis present

## 2015-08-11 DIAGNOSIS — Z7409 Other reduced mobility: Secondary | ICD-10-CM | POA: Diagnosis present

## 2015-08-11 DIAGNOSIS — R202 Paresthesia of skin: Secondary | ICD-10-CM | POA: Diagnosis present

## 2015-08-11 NOTE — Therapy (Signed)
[place], [place], [address] Phone: [phone]   Fax:  [phone]  Physical Therapy Treatment  Patient Details  Name: Teresa Soto MRN: [id number] Date of Birth: 05/05/1956 Referring Provider: Raliegh Ip. [place]  Encounter Date: 08/11/2015      PT End of Session - 08/11/15 0840    Visit Number 2   Number of Visits 8   Date for PT Re-Evaluation 09/08/15   Authorization Type BCBS   Authorization Time Period 08/09/15-10/09/15   PT Start Time 0800   PT Stop Time 0848   PT Time Calculation (min) 48 min   Activity Tolerance Patient tolerated treatment well   Behavior During Therapy [facility] for tasks assessed/performed      Past Medical History  Diagnosis Date  . Vertigo   . Hypertension   . Asthma   . Neuropathic pain   . Restless legs syndrome 2007 approx  . Depression   . Chronic back pain   . Stroke ([place])   . Spinal headache   . 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 Meniere disease     Past Surgical History  Procedure Laterality Date  . Back surgery    . Tonsillectomy    . Cesarean section      x 2  . Shoulder arthroscopy w/ rotator cuff repair      to right shoulder  . Tubal ligation    . Spine surgery  12/2010    ruptd L1 L2 , Dr Carloyn Manner  . Total knee arthroplasty Right 08/12/2014    Procedure: RIGHT TOTAL KNEE ARTHROPLASTY;  Surgeon: Carole Civil, MD;  Location: [facility];  Service: Orthopedics;  Laterality: Right;    There were no vitals filed for this visit.  Visit Diagnosis:  Weakness of both legs  Unsteadiness  Decreased functional mobility and endurance  Right knee pain  Weakness of right lower extremity  Knee stiffness, right  S/P right knee arthroscopy  Lack of coordination      Subjective Assessment - 08/11/15 0907    Subjective Pt states she's been doing her HEP and is concerned with her speech and her decreased coordination in her Rt hand.  States her most difficult task  is retrieving item from floor.                          [facility] PT Treatment/Exercise - 08/11/15 0836    Knee/Hip Exercises: Supine   Straight Leg Raises Both;10 reps;2 sets   Knee/Hip Exercises: Sidelying   Hip ABduction Both;10 reps;2 sets   Knee/Hip Exercises: Prone   Hip Extension Both;10 reps;2 sets                PT Education - 08/11/15 0833    Education provided Yes   Education Details Review of HEP, updated HEP, reviewed evaluation, answered questions patient had about her coordination and OT/ST followup, discussion with evaluating therapist and extra time with form with therex   Person(s) Educated Patient   Methods Explanation;Demonstration;Handout   Comprehension Verbalized understanding;Tactile cues required;Verbal cues required;Returned demonstration;Need further instruction          PT Short Term Goals - 08/10/15 0738    PT SHORT TERM GOAL #1   Title Pt will be independent with HEP for BLE strengthening and balance training.   Time 2   Period Weeks   Status New   PT SHORT TERM GOAL #2   Title  Improve bilateral hip extensor strength to 4/5 or greater to improve functional mobility.   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    Time 2   Period Weeks   Status New           PT Long Term Goals - 08/10/15 0740    PT LONG TERM GOAL #1   Title Pt will be independent with advanced HEP for BLE strengthening and balance, to be updated PRN.    Time 4   Period Weeks   Status New   PT LONG TERM GOAL #2   Title Improve bilateral hip strength to 4+/5 to improve stability and functional mobility.   Time 4   Period Weeks   Status New   PT LONG TERM GOAL #3   Title Pt will complete floor to stand transfers x 5 with proper form and no LOB to allow her to pick things up off of  the floor at work with decreased risk for falls.    Time 4   Period Weeks   Status New               Plan - 08/11/15 0844    Clinical Impression Statement Majority of session spent on education and answering patients questions.  Added hip extension, abduction and flexion exercises to HEP.  Unable to progress to balance actvities today due to extra time spent with eductaion and form of new exercises.  PT requires frequent re-direction to stay on task.  Pt able to complete exercises with minimal cues from therapist with overal good form following instruction.     PT Next Visit Plan Review HEP, begin high level balance training, continue SLS and squats and progress to proper lifting   PT Home Exercise Plan updated with bilateral LE's globals except hip adduction for HEP        Problem List Patient Active Problem List   Diagnosis Date Noted  . Postoperative pulmonary edema ([place]) 08/15/2014  . 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 Primary osteoarthritis of right knee 07/27/2014  . OA (osteoarthritis) of knee 06/16/2014  . Idiopathic parathyroidism ([place]) 06/16/2014  . Tinea versicolor 06/16/2014  . Obesity, unspecified 04/06/2013  . Lack of coordination 10/21/2012  . CVA (cerebral infarction) 10/10/2012  . Vitamin D deficiency 11/10/2011  . Hearing loss 06/29/2011  . Carotid bruit 06/29/2011  . BACK PAIN, LUMBAR, WITH RADICULOPATHY 06/28/2010  . SCIATICA, LEFT 05/11/2010  . Major depression ([place]) 04/04/2010  . PERIPHERAL EDEMA 04/08/2009  . DEGENERATIVE JOINT DISEASE 03/23/2008  . Hyperlipidemia 11/18/2007  . INSOMNIA 10/17/2007  . Asthma 11/30/2006  . Essential hypertension 09/05/2006  . ARTHRITIS 09/05/2006  . VERTIGO 09/05/2006    Teena Irani, PTA/CLT [phone]  08/11/2015, 9:08 AM  [place], [place], [address] Phone: [phone]   Fax:  [phone]  Name: Teresa [facility]oto MRN:  [id number] Date of Birth: [birth year]

## 2015-08-11 NOTE — Patient Instructions (Signed)
Straight Leg Raise    Tighten stomach and slowly raise locked right leg __10__ inches from floor. Repeat _10___ times per set. Do __2__ sets per session. Do __2__ sessions per day.  http://orth.exer.us/1103   Copyright  VHI. All rights reserved.  Abduction: Side Leg Lift (Eccentric) - Side-Lying    Lie on side. Lift top leg slightly higher than shoulder level. Keep top leg straight with body, toes pointing forward. Slowly lower for 3-5 seconds. _10__ reps per set, _2__ sets per day  week.   http://ecce.exer.us/63   Copyright  VHI. All rights reserved.  HIP / KNEE: Extension - Prone    Squeeze glutes. Raise leg up. Keep knee straight. _10__ reps per set, _2__ sets per day  Copyright  VHI. All rights reserved.

## 2015-08-16 IMAGING — CR DG KNEE 1-2V*R*
2 series · 2 of 2 positions shown · non-contrast
Comparison: Right knee MRI July 22, 2014; right knee radiographs
July 08, 2014

CLINICAL DATA: Status post arthroplasty

EXAM:
RIGHT KNEE - 1-2 VIEW

[ap]
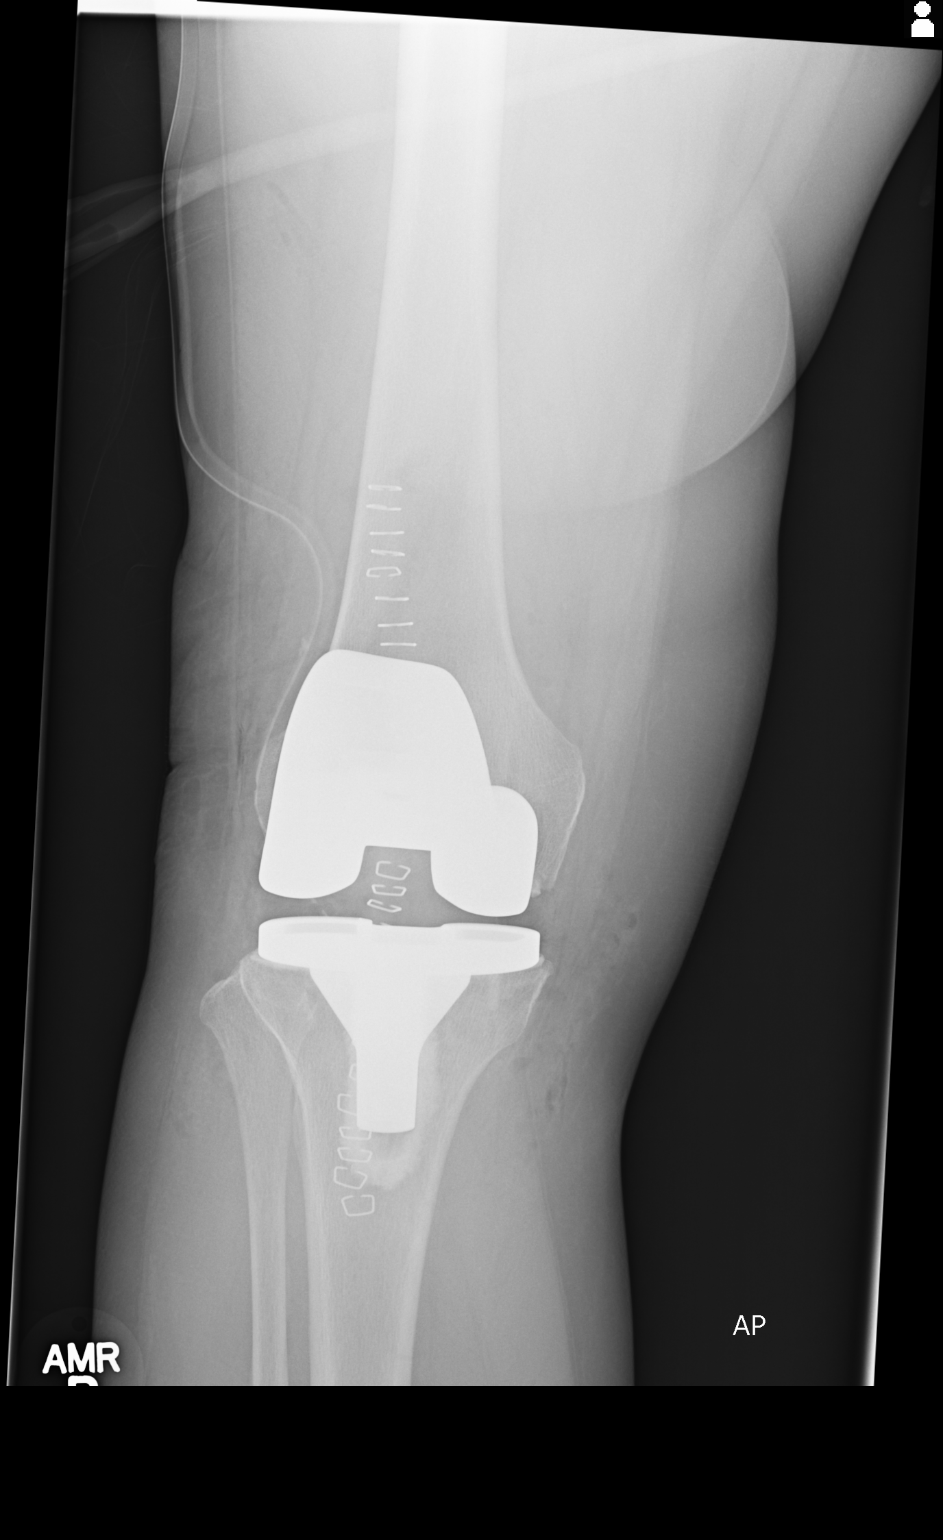

[lat]
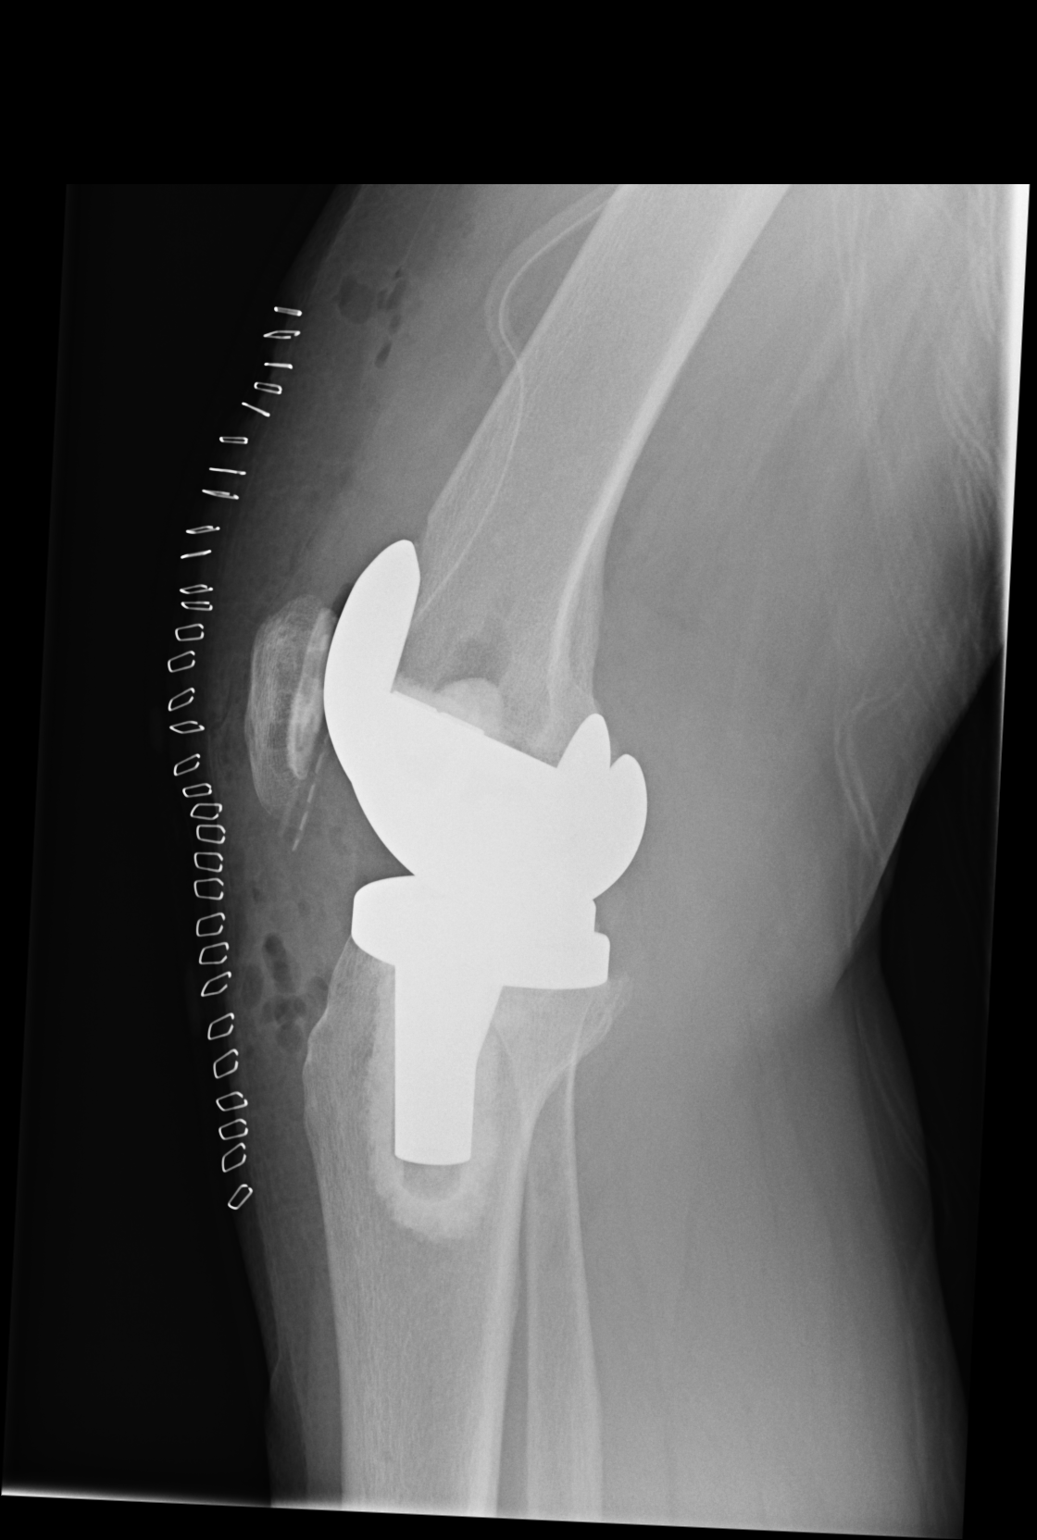

[2 of 2 positions shown; findings below may reference images not displayed]

FINDINGS: Frontal and lateral views were obtained. Patient is status post
total knee replacement with femoral and tibial prosthetic components
appearing well-seated. No fracture or dislocation. A drain is
present within the anterior knee joint. Air in this region is an
expected postoperative finding.
IMPRESSION: No acute fracture or dislocation. Prosthetic components appear well
seated.

## 2015-08-17 ENCOUNTER — Other Ambulatory Visit (HOSPITAL_COMMUNITY): Payer: BLUE CROSS/BLUE SHIELD

## 2015-08-18 ENCOUNTER — Ambulatory Visit (HOSPITAL_COMMUNITY): Payer: BLUE CROSS/BLUE SHIELD

## 2015-08-18 ENCOUNTER — Telehealth (HOSPITAL_COMMUNITY): Payer: Self-pay

## 2015-08-18 NOTE — Telephone Encounter (Signed)
No show, called and left message explaining missed apt today and included next apt date and time  Teresa Soto, [address]; [facility] [phone]

## 2015-08-19 ENCOUNTER — Telehealth (HOSPITAL_COMMUNITY): Payer: Self-pay | Admitting: Physical Therapy

## 2015-08-19 ENCOUNTER — Ambulatory Visit (HOSPITAL_COMMUNITY): Payer: BLUE CROSS/BLUE SHIELD | Admitting: Physical Therapy

## 2015-08-19 NOTE — Telephone Encounter (Signed)
Pt did not show for appointment.  Pt called 15 after scheduled time and asked if she could still come and would take 15 minutes to get to clinic.

## 2015-08-20 IMAGING — CR DG CHEST 1V PORT
1 series · 1 of 1 positions shown · non-contrast
Comparison: 08/15/2014;

CLINICAL DATA: Fever.  Subsequent encounter.

EXAM:
PORTABLE CHEST - 1 VIEW

[portable]
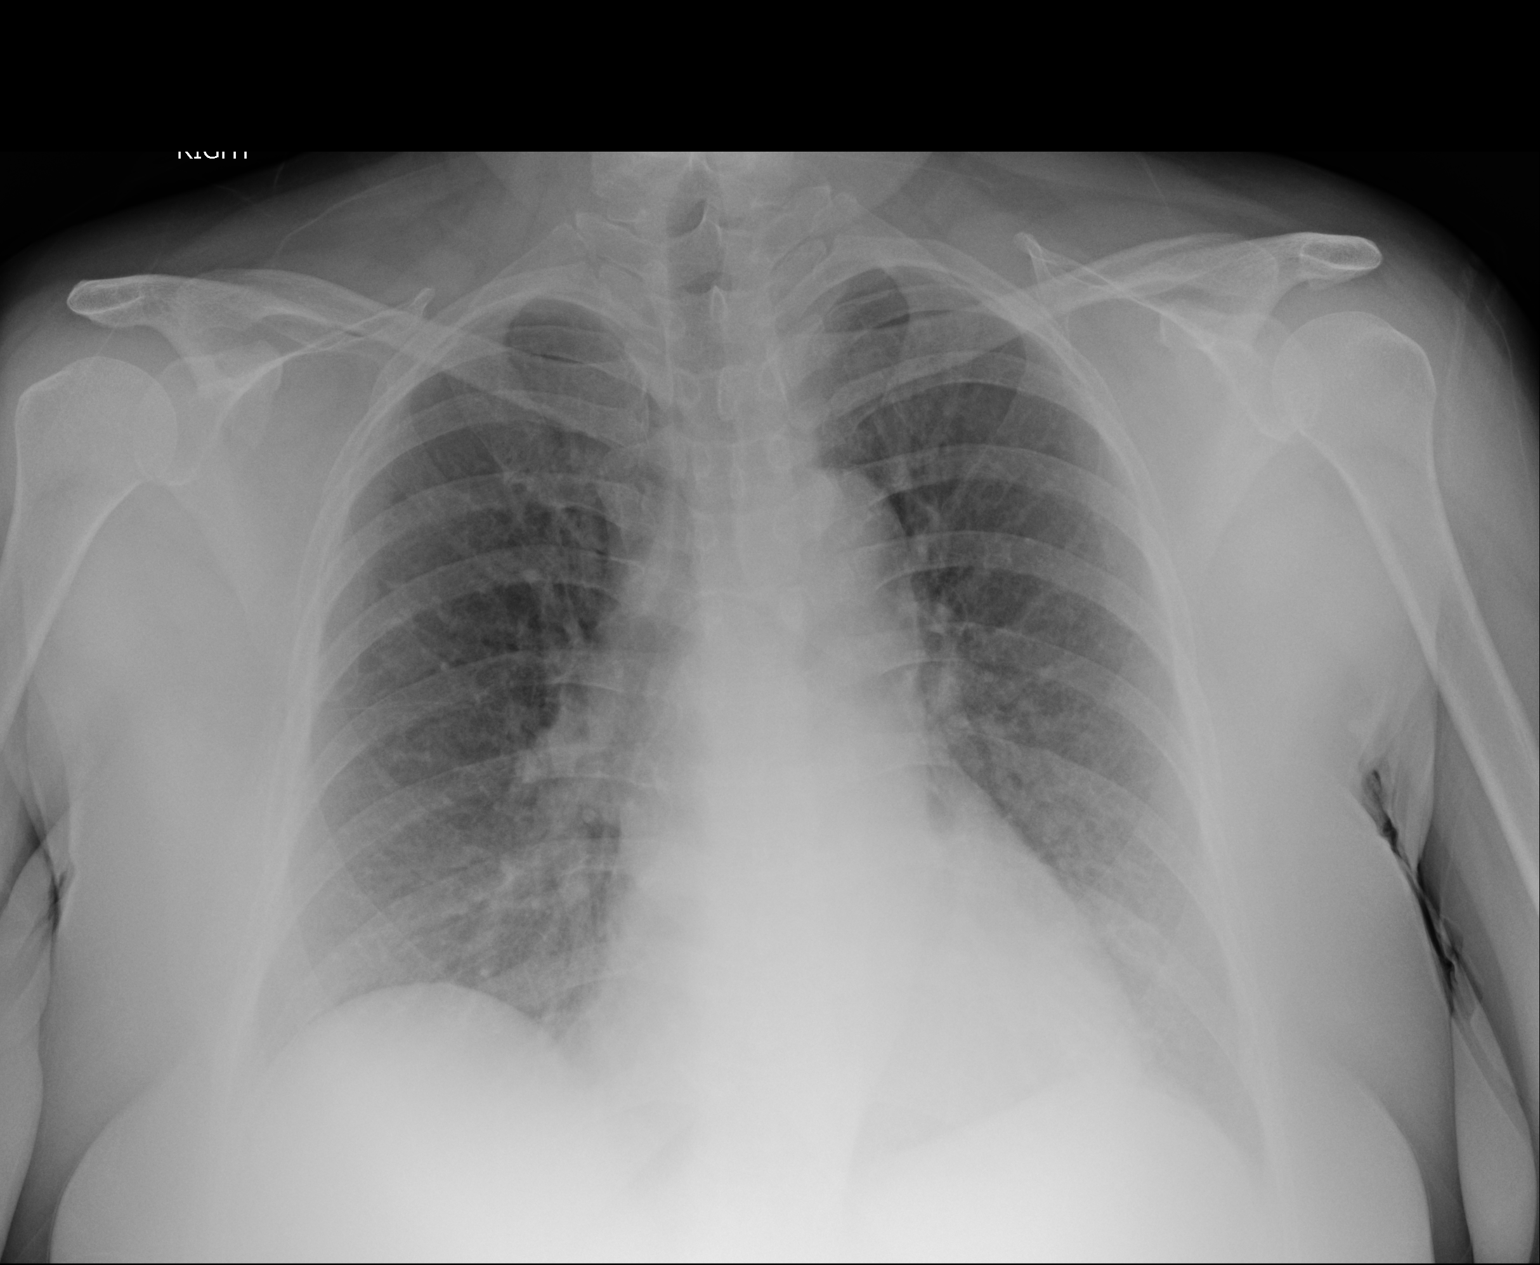

[1 of 1 positions shown; findings below may reference images not displayed]

10/27/2013; 10/06/2012 grossly unchanged
cardiac silhouette and mediastinal contours.
FINDINGS: Examination is again degraded due to patient body habitus and
portable technique.

The heart size and mediastinal contours are within normal limits.
Both lungs are clear. The visualized skeletal structures are
unremarkable. Minimally improved aeration of the lungs with
persistent bibasilar opacities, left greater than right, likely
atelectasis. No pleural effusion or pneumothorax. No evidence of
edema. Unchanged bones.
IMPRESSION: Minimally improved aeration of lungs with persistent bibasilar
opacities, left greater than right, atelectasis versus infiltrate.
Further evaluation with a PA and lateral chest radiograph may be
obtained as clinically indicated.

## 2015-08-24 ENCOUNTER — Ambulatory Visit (HOSPITAL_COMMUNITY): Payer: BLUE CROSS/BLUE SHIELD | Admitting: Physical Therapy

## 2015-08-24 DIAGNOSIS — R279 Unspecified lack of coordination: Secondary | ICD-10-CM

## 2015-08-24 DIAGNOSIS — R2681 Unsteadiness on feet: Secondary | ICD-10-CM

## 2015-08-24 DIAGNOSIS — M25661 Stiffness of right knee, not elsewhere classified: Secondary | ICD-10-CM

## 2015-08-24 DIAGNOSIS — Z7409 Other reduced mobility: Secondary | ICD-10-CM

## 2015-08-24 DIAGNOSIS — R29898 Other symptoms and signs involving the musculoskeletal system: Secondary | ICD-10-CM | POA: Diagnosis not present

## 2015-08-24 DIAGNOSIS — M25561 Pain in right knee: Secondary | ICD-10-CM

## 2015-08-24 DIAGNOSIS — Z9889 Other specified postprocedural states: Secondary | ICD-10-CM

## 2015-08-24 NOTE — Therapy (Signed)
[place], [place], [address] Phone: [phone]   Fax:  [phone]  Physical Therapy Treatment  Patient Details  Name: Teresa Soto MRN: [id number] Date of Birth: 11-29-1955 Referring Provider: Raliegh Ip. [place]  Encounter Date: 08/24/2015      PT End of Session - 08/24/15 1559    Visit Number 3   Number of Visits 8   Date for PT Re-Evaluation 09/08/15   Authorization Type BCBS   Authorization Time Period 08/09/15-10/09/15   PT Start Time 1520   PT Stop Time 1605   PT Time Calculation (min) 45 min   Activity Tolerance Patient tolerated treatment well   Behavior During Therapy [facility] for tasks assessed/performed      Past Medical History  Diagnosis Date  . Vertigo   . Hypertension   . Asthma   . Neuropathic pain   . Restless legs syndrome 2007 approx  . Depression   . Chronic back pain   . Stroke ([place])   . Spinal headache   . 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 Meniere disease     Past Surgical History  Procedure Laterality Date  . Back surgery    . Tonsillectomy    . Cesarean section      x 2  . Shoulder arthroscopy w/ rotator cuff repair      to right shoulder  . Tubal ligation    . Spine surgery  12/2010    ruptd L1 L2 , Dr Carloyn Manner  . Total knee arthroplasty Right 08/12/2014    Procedure: RIGHT TOTAL KNEE ARTHROPLASTY;  Surgeon: Carole Civil, MD;  Location: [facility];  Service: Orthopedics;  Laterality: Right;    There were no vitals filed for this visit.  Visit Diagnosis:  Weakness of both legs  Unsteadiness  Decreased functional mobility and endurance  Right knee pain  Weakness of right lower extremity  Knee stiffness, right  S/P right knee arthroscopy  Lack of coordination      Subjective Assessment - 08/24/15 1558    Subjective PT states she has Menieres disease and it is bothering her today . States she is taking Meclizine and Zofran but certain positioning is still  bothering her.  No complaints of pain today.   Currently in Pain? No/denies                         [facility] Adult PT Treatment/Exercise - 08/24/15 1528    Knee/Hip Exercises: Aerobic   Nustep 8 minutes LE only level 2 hills #3    Knee/Hip Exercises: Supine   Straight Leg Raises Both;10 reps;2 sets   Knee/Hip Exercises: Sidelying   Hip ABduction Both;10 reps;2 sets   Knee/Hip Exercises: Prone   Hip Extension Both;10 reps;2 sets                  PT Short Term Goals - 08/10/15 [id number]    PT SHORT TERM GOAL #1   Title Pt will be independent with HEP for BLE strengthening and balance training.   Time 2   Period Weeks   Status New   PT SHORT TERM GOAL #2   Title Improve bilateral hip extensor strength to 4/5 or greater to improve functional mobility.   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    Time 2   Period Weeks   Status New           PT Long Term Goals - 08/10/15 0740    PT LONG TERM GOAL #1   Title Pt will be independent with advanced HEP for BLE strengthening and balance, to be updated PRN.    Time 4   Period Weeks   Status New   PT LONG TERM GOAL #2   Title Improve bilateral hip strength to 4+/5 to improve stability and functional mobility.   Time 4   Period Weeks   Status New   PT LONG TERM GOAL #3   Title Pt will complete floor to stand transfers x 5 with proper form and no LOB to allow her to pick things up off of the floor at work with decreased risk for falls.    Time 4   Period Weeks   Status New               Plan - 08/24/15 1559    Clinical Impression Statement did not progress balance exercises today due to Menieres flare up.  Attempted sit to stand, however patient reported that motion would increase her symptoms, however able to complete a floor to stand  transfer without diffiiculty.  Added nustep at end of session to increase actvity tolerance.  PT requires minimal cues during session to complete exercises correctly.    PT Next Visit Plan Begin high level balance training when Menieres is undercontrol.  Add SLS and squats and progress to proper lifting        Problem List Patient Active Problem List   Diagnosis Date Noted  . Postoperative pulmonary edema ([place]) 08/15/2014  . 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 Primary osteoarthritis of right knee 07/27/2014  . OA (osteoarthritis) of knee 06/16/2014  . Idiopathic parathyroidism ([place]) 06/16/2014  . Tinea versicolor 06/16/2014  . Obesity, unspecified 04/06/2013  . Lack of coordination 10/21/2012  . CVA (cerebral infarction) 10/10/2012  . Vitamin D deficiency 11/10/2011  . Hearing loss 06/29/2011  . Carotid bruit 06/29/2011  . BACK PAIN, LUMBAR, WITH RADICULOPATHY 06/28/2010  . SCIATICA, LEFT 05/11/2010  . Major depression ([place]) 04/04/2010  . PERIPHERAL EDEMA 04/08/2009  . DEGENERATIVE JOINT DISEASE 03/23/2008  . Hyperlipidemia 11/18/2007  . INSOMNIA 10/17/2007  . Asthma 11/30/2006  . Essential hypertension 09/05/2006  . ARTHRITIS 09/05/2006  . VERTIGO 09/05/2006    Teena Irani, PTA/CLT [phone]  08/24/2015, 4:05 PM  [place] [address] [place], [place], [address] Phone: [phone]   Fax:  [phone]  Name: Teresa Soto MRN: [id number] Date of Birth: [birth year]

## 2015-08-26 ENCOUNTER — Ambulatory Visit (HOSPITAL_COMMUNITY): Payer: BLUE CROSS/BLUE SHIELD | Admitting: Physical Therapy

## 2015-08-26 DIAGNOSIS — M25561 Pain in right knee: Secondary | ICD-10-CM

## 2015-08-26 DIAGNOSIS — R29898 Other symptoms and signs involving the musculoskeletal system: Secondary | ICD-10-CM

## 2015-08-26 DIAGNOSIS — Z7409 Other reduced mobility: Secondary | ICD-10-CM

## 2015-08-26 DIAGNOSIS — R279 Unspecified lack of coordination: Secondary | ICD-10-CM

## 2015-08-26 DIAGNOSIS — R2681 Unsteadiness on feet: Secondary | ICD-10-CM

## 2015-08-26 DIAGNOSIS — Z9889 Other specified postprocedural states: Secondary | ICD-10-CM

## 2015-08-26 DIAGNOSIS — M25661 Stiffness of right knee, not elsewhere classified: Secondary | ICD-10-CM

## 2015-08-26 NOTE — Therapy (Signed)
[place], [place], [address] Phone: [phone]   Fax:  [phone]  Physical Therapy Treatment  Patient Details  Name: Teresa Soto MRN: [id number] Date of Birth: 12-17-1955 Referring Provider: Raliegh Ip. [place]  Encounter Date: 08/26/2015      PT End of Session - 08/26/15 1542    Visit Number 4   Number of Visits 8   Date for PT Re-Evaluation 09/08/15   Authorization Type BCBS   Authorization Time Period 08/09/15-10/09/15   PT Start Time 1530   PT Stop Time 1615   PT Time Calculation (min) 45 min   Activity Tolerance Patient tolerated treatment well   Behavior During Therapy [facility] for tasks assessed/performed      Past Medical History  Diagnosis Date  . Vertigo   . Hypertension   . Asthma   . Neuropathic pain   . Restless legs syndrome 2007 approx  . Depression   . Chronic back pain   . Stroke ([place])   . Spinal headache   . 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 Meniere disease     Past Surgical History  Procedure Laterality Date  . Back surgery    . Tonsillectomy    . Cesarean section      x 2  . Shoulder arthroscopy w/ rotator cuff repair      to right shoulder  . Tubal ligation    . Spine surgery  12/2010    ruptd L1 L2 , Dr Carloyn Manner  . Total knee arthroplasty Right 08/12/2014    Procedure: RIGHT TOTAL KNEE ARTHROPLASTY;  Surgeon: Carole Civil, MD;  Location: [facility];  Service: Orthopedics;  Laterality: Right;    There were no vitals filed for this visit.  Visit Diagnosis:  Unsteadiness  Weakness of both legs  Decreased functional mobility and endurance  Right knee pain  Weakness of right lower extremity  Knee stiffness, right  S/P right knee arthroscopy  Lack of coordination      Subjective Assessment - 08/26/15 1550    Subjective Pt late for appointment due to having to work today.  Pt states her Menieres is not as bad today and thinks she can attempt some balance actvities  today.     Currently in Pain? No/denies                         [facility] Adult PT Treatment/Exercise - 08/26/15 1544    Knee/Hip Exercises: Aerobic   Nustep 8 minutes LE only level 3 hills #3    Knee/Hip Exercises: Supine   Straight Leg Raises Both;15 reps   Knee/Hip Exercises: Sidelying   Hip ABduction Both;15 reps   Knee/Hip Exercises: Prone   Hip Extension Both;15 reps             Balance Exercises - 08/26/15 1620    Balance Exercises: Standing   Balance Beam 2Rt each fwd/bkwd/side stepping             PT Short Term Goals - 08/10/15 0738    PT SHORT TERM GOAL #1   Title Pt will be independent with HEP for BLE strengthening and balance training.   Time 2   Period Weeks   Status New   PT SHORT TERM GOAL #2   Title Improve bilateral hip extensor strength to 4/5 or greater to improve functional mobility.   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    Time 2   Period Weeks   Status New           PT Long Term Goals - 08/10/15 0740    PT LONG TERM GOAL #1   Title Pt will be independent with advanced HEP for BLE strengthening and balance, to be updated PRN.    Time 4   Period Weeks   Status New   PT LONG TERM GOAL #2   Title Improve bilateral hip strength to 4+/5 to improve stability and functional mobility.   Time 4   Period Weeks   Status New   PT LONG TERM GOAL #3   Title Pt will complete floor to stand transfers x 5 with proper form and no LOB to allow her to pick things up off of the floor at work with decreased risk for falls.    Time 4   Period Weeks   Status New               Plan - 08/26/15 1615    Clinical Impression Statement Progressed reps with mat exericses and began balance activities.  Added tandem fwd/retro and sidestepping on airex balance beam mat.   Pt able to complete with min assiist .  No dizziness or nausea episodes reported with any actvity today.    PT Next Visit Plan Progress balance training.  Add SLS and squats and progress to proper lifting        Problem List Patient Active Problem List   Diagnosis Date Noted  . Postoperative pulmonary edema ([place]) 08/15/2014  . 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 Primary osteoarthritis of right knee 07/27/2014  . OA (osteoarthritis) of knee 06/16/2014  . Idiopathic parathyroidism ([place]) 06/16/2014  . Tinea versicolor 06/16/2014  . Obesity, unspecified 04/06/2013  . Lack of coordination 10/21/2012  . CVA (cerebral infarction) 10/10/2012  . Vitamin D deficiency 11/10/2011  . Hearing loss 06/29/2011  . Carotid bruit 06/29/2011  . BACK PAIN, LUMBAR, WITH RADICULOPATHY 06/28/2010  . SCIATICA, LEFT 05/11/2010  . Major depression ([place]) 04/04/2010  . PERIPHERAL EDEMA 04/08/2009  . DEGENERATIVE JOINT DISEASE 03/23/2008  . Hyperlipidemia 11/18/2007  . INSOMNIA 10/17/2007  . Asthma 11/30/2006  . Essential hypertension 09/05/2006  . ARTHRITIS 09/05/2006  . VERTIGO 09/05/2006    Teena Irani, PTA/CLT [phone] 08/26/2015, 4:21 PM  [place] [address] [place], [place], [address] Phone: [phone]   Fax:  [phone]  Name: Teresa Soto MRN: [id number] Date of Birth: 03/28/1956

## 2015-08-31 ENCOUNTER — Ambulatory Visit (HOSPITAL_COMMUNITY): Payer: BLUE CROSS/BLUE SHIELD | Admitting: Specialist

## 2015-08-31 ENCOUNTER — Ambulatory Visit (HOSPITAL_COMMUNITY): Payer: BLUE CROSS/BLUE SHIELD | Admitting: Physical Therapy

## 2015-08-31 DIAGNOSIS — R29898 Other symptoms and signs involving the musculoskeletal system: Secondary | ICD-10-CM

## 2015-08-31 DIAGNOSIS — R279 Unspecified lack of coordination: Principal | ICD-10-CM

## 2015-08-31 DIAGNOSIS — Z7409 Other reduced mobility: Secondary | ICD-10-CM

## 2015-08-31 DIAGNOSIS — M25561 Pain in right knee: Secondary | ICD-10-CM

## 2015-08-31 DIAGNOSIS — M25661 Stiffness of right knee, not elsewhere classified: Secondary | ICD-10-CM

## 2015-08-31 DIAGNOSIS — R202 Paresthesia of skin: Secondary | ICD-10-CM

## 2015-08-31 DIAGNOSIS — R2681 Unsteadiness on feet: Secondary | ICD-10-CM

## 2015-08-31 DIAGNOSIS — R278 Other lack of coordination: Secondary | ICD-10-CM

## 2015-08-31 DIAGNOSIS — R200 Anesthesia of skin: Secondary | ICD-10-CM

## 2015-08-31 NOTE — Therapy (Signed)
[place], [place], [address] Phone: [phone]   Fax:  [phone]  Physical Therapy Treatment  Patient Details  Name: Teresa Soto MRN: [id number] Date of Birth: Aug 18, 1956 Referring Provider: Raliegh Ip. [place]  Encounter Date: 08/31/2015      PT End of Session - 08/31/15 1711    Visit Number 5   Number of Visits 8   Date for PT Re-Evaluation 09/08/15   Authorization Type BCBS   Authorization Time Period 08/09/15-10/09/15   PT Start Time 1515   PT Stop Time 1600   PT Time Calculation (min) 45 min   Activity Tolerance Patient tolerated treatment well   Behavior During Therapy [facility] for tasks assessed/performed      Past Medical History  Diagnosis Date  . Vertigo   . Hypertension   . Asthma   . Neuropathic pain   . Restless legs syndrome 2007 approx  . Depression   . Chronic back pain   . Stroke ([place])   . Spinal headache   . 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 Meniere disease     Past Surgical History  Procedure Laterality Date  . Back surgery    . Tonsillectomy    . Cesarean section      x 2  . Shoulder arthroscopy w/ rotator cuff repair      to right shoulder  . Tubal ligation    . Spine surgery  12/2010    ruptd L1 L2 , Dr Carloyn Manner  . Total knee arthroplasty Right 08/12/2014    Procedure: RIGHT TOTAL KNEE ARTHROPLASTY;  Surgeon: Carole Civil, MD;  Location: [facility];  Service: Orthopedics;  Laterality: Right;    There were no vitals filed for this visit.  Visit Diagnosis:  Unsteadiness  Decreased functional mobility and endurance  Right knee pain  Weakness of right lower extremity  Knee stiffness, right  Weakness of both legs      Subjective Assessment - 08/31/15 1528    Subjective Pt received OT evaluation today for fine motor control in her Rt UE for 2X week.  PT currently with pain in her back at 4/10 stating it's her sciatic nerve.    Currently in Pain? Yes   Pain Score 4     Pain Location Back   Pain Descriptors / Indicators Radiating;Sharp   Pain Radiating Towards LT LE to ankle.                          [facility] Adult PT Treatment/Exercise - 08/31/15 1531    Knee/Hip Exercises: Aerobic   Nustep --   Knee/Hip Exercises: Supine   Bridges Limitations 15 reps   Straight Leg Raises Both;15 reps   Knee/Hip Exercises: Sidelying   Hip ABduction Both;15 reps   Knee/Hip Exercises: Prone   Hip Extension Both;15 reps; only completed 5 reps each today.             Balance Exercises - 08/31/15 1616    Balance Exercises: Standing   SLS Eyes open;3 reps;30 secs   Balance Beam 2Rt each fwd/bkwd/side stepping   OTAGO PROGRAM   Heel Walking No support  RT   Toe Walk No support             PT Short Term Goals - 08/10/15 0738    PT SHORT TERM GOAL #1   Title Pt will be independent with HEP for BLE strengthening and balance training.  Time 2   Period Weeks   Status New   PT SHORT TERM GOAL #2   Title Improve bilateral hip extensor strength to 4/5 or greater to improve functional mobility.   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    Time 2   Period Weeks   Status New           PT Long Term Goals - 08/10/15 0740    PT LONG TERM GOAL #1   Title Pt will be independent with advanced HEP for BLE strengthening and balance, to be updated PRN.    Time 4   Period Weeks   Status New   PT LONG TERM GOAL #2   Title Improve bilateral hip strength to 4+/5 to improve stability and functional mobility.   Time 4   Period Weeks   Status New   PT LONG TERM GOAL #3   Title Pt will complete floor to stand transfers x 5 with proper form and no LOB to allow her to pick things up off of the floor at work with decreased risk for falls.    Time 4   Period Weeks   Status New                Plan - 08/31/15 1711    Clinical Impression Statement Continued to focus on increasing LE strength and stability.  Pt with difficulty completeing prone SLR today due to weakness and discomfort.  Added heel and toe walking for balance and able to complete all other balance beam actvities without LOB or difficulty.  No dizziness or pain reported during session today.     PT Next Visit Plan Progress balance training.  Progress to proper lifting        Problem List Patient Active Problem List   Diagnosis Date Noted  . Postoperative pulmonary edema ([place]) 08/15/2014  . 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 Primary osteoarthritis of right knee 07/27/2014  . OA (osteoarthritis) of knee 06/16/2014  . Idiopathic parathyroidism ([place]) 06/16/2014  . Tinea versicolor 06/16/2014  . Obesity, unspecified 04/06/2013  . Lack of coordination 10/21/2012  . CVA (cerebral infarction) 10/10/2012  . Vitamin D deficiency 11/10/2011  . Hearing loss 06/29/2011  . Carotid bruit 06/29/2011  . BACK PAIN, LUMBAR, WITH RADICULOPATHY 06/28/2010  . SCIATICA, LEFT 05/11/2010  . Major depression ([place]) 04/04/2010  . PERIPHERAL EDEMA 04/08/2009  . DEGENERATIVE JOINT DISEASE 03/23/2008  . Hyperlipidemia 11/18/2007  . INSOMNIA 10/17/2007  . Asthma 11/30/2006  . Essential hypertension 09/05/2006  . ARTHRITIS 09/05/2006  . VERTIGO 09/05/2006    Teena Irani, PTA/CLT [phone] 08/31/2015, 5:14 PM  [place] [address] [place], [place], [address] Phone: [phone]   Fax:  [phone]  Name: Teresa Soto MRN: [id number] Date of Birth: [birth year]

## 2015-09-01 ENCOUNTER — Ambulatory Visit (HOSPITAL_COMMUNITY): Payer: BLUE CROSS/BLUE SHIELD | Admitting: Physical Therapy

## 2015-09-01 ENCOUNTER — Telehealth (HOSPITAL_COMMUNITY): Payer: Self-pay | Admitting: Specialist

## 2015-09-01 ENCOUNTER — Ambulatory Visit (HOSPITAL_COMMUNITY): Payer: BLUE CROSS/BLUE SHIELD | Admitting: Specialist

## 2015-09-01 NOTE — Therapy (Signed)
[place], [place], [address] Phone: [phone]   Fax:  [phone]  Occupational Therapy Evaluation  Patient Details  Name: Teresa Soto MRN: [id number] Date of Birth: [birth year] Referring Provider: Dr. Buelah Manis  Encounter Date: 08/31/2015      OT End of Session - 09/01/15 0821    Visit Number 1   Number of Visits 8   Date for OT Re-Evaluation 10/31/15  mini reassess on 09/29/15   Authorization Type BCBS   Authorization Time Period 50 days per calendar year also recieving PT   Authorization - Visit Number 6   Authorization - Number of Visits 50   OT Start Time [id number]   OT Stop Time 1510   OT Time Calculation (min) 35 min   Activity Tolerance Patient tolerated treatment well   Behavior During Therapy [facility] for tasks assessed/performed      Past Medical History  Diagnosis Date  . Vertigo   . Hypertension   . Asthma   . Neuropathic pain   . Restless legs syndrome 2007 approx  . Depression   . Chronic back pain   . Stroke ([place])   . Spinal headache   . 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 Meniere disease     Past Surgical History  Procedure Laterality Date  . Back surgery    . Tonsillectomy    . Cesarean section      x 2  . Shoulder arthroscopy w/ rotator cuff repair      to right shoulder  . Tubal ligation    . Spine surgery  12/2010    ruptd L1 L2 , Dr Carloyn Manner  . Total knee arthroplasty Right 08/12/2014    Procedure: RIGHT TOTAL KNEE ARTHROPLASTY;  Surgeon: Carole Civil, MD;  Location: [facility];  Service: Orthopedics;  Laterality: Right;    There were no vitals filed for this visit.  Visit Diagnosis:  Decreased coordination - Plan: Ot plan of care cert/re-cert  Numbness and tingling in right hand - Plan: Ot plan of care cert/re-cert      Subjective Assessment - 08/31/15 1625    Subjective  S:  I feel like my coordination is getting worse.  I want to be able to pick things up without  dropping them.    Pertinent History Teresa Soto has a past medical history significant for CVA approximately 3 years ago, CTR in 2004, [facility] joint arthritis in right hand, and back surgery.  She attended therapy after her stroke and had funcitonal fine motor coordination at that time.  She notes that her coordiantion has decrased and numbness in her hand has increased.  SHe has been referred to occupational therapy for evaluation and treatment.    Patient Stated Goals I want to stop dropping change.    Currently in Pain? No/denies           08/31/15 0001  Assessment  Diagnosis Right Fine Motor Coordination Deficit  Referring Provider Dr. Buelah Manis  Onset Date (3 years ago, worsened in the past 6 months )  Prior Therapy yes after CVA, currently recieving PT for balance deficit  Precautions  Precautions None  Restrictions  Weight Bearing Restrictions No  Balance Screen  Has the patient fallen in the past 6 months No  Has the patient had a decrease in activity level because of a fear of falling?  No  Is the patient reluctant to leave their home because of a fear of falling?  No  Home  Environment  Family/patient expects to be discharged to: Private residence  Prior Function  Level of Independence Independent  Vocation Part time employment  Vocation Requirements Works as Scientist, water quality at [employer] Enjoys reading  ADL  ADL comments difficulty picking up items, drinking from a glass, picking up change with right hand  Written Expression  Dominant Hand Right  Handwriting 100% legible  Vision - History  Baseline Vision Wears glasses only for reading  Cognition  Overall Cognitive Status Within Functional Limits for tasks assessed  Observation/Other Assessments  Focus on Therapeutic Outcomes (FOTO)  outcomes assessed with Nine Hole Peg Test Score  Sensation  Semmes Weinstein Monofilament Scale 3.22-3.61 (right hand median nerve, other WFL)  Stereognosis Appears Intact (3/3 correct  bilateral hands)  Coordination  Gross Motor Movements are Fluid and Coordinated Yes  Fine Motor Movements are Fluid and Coordinated No  9 Hole Peg Test Right;Left  Right 9 Hole Peg Test 21.31"  Left 9 Hole Peg Test 17.94"  Other webspace is 1.75" in right hand and 2.0" in left hand   Coordination difficult to oppose to small digit in right hand  Palpation  Palpation comment right thenar eminence with greater restrictions, 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     Person(s) Educated Patient   Methods Explanation;Demonstration;Handout   Comprehension Verbalized understanding;Returned demonstration          OT Short Term Goals - 09/01/15 0825    OT SHORT TERM GOAL #1   Title Patient will be educated on a HEP for coordination training and sensory reintegration.   Time 4   Period Weeks   Status New   OT SHORT TERM GOAL #2   Title Patient will improve right 3 point pinch strength by 2# for improved abilty to pick up cups.   Time 4   Period Weeks   Status New   OT SHORT TERM GOAL #3   Title Patient will improve right hand fine motor coordination by decreasing completion time on nine hole peg test by 4" or more for increased ability to pick up change at work.    Time 4   Period Weeks   Status New   OT SHORT TERM GOAL #4   Title Patient will improve sensation in right hand  from diminished light touch to normal for increased ability to pick up change at work.    Time 4   Period Weeks   Status New   OT SHORT TERM GOAL #5   Title Patient will increase webspace to 2" in right hand for greater dexterity in right hand needed for coordination activities.  Time 4   Period Weeks   Status New   Additional Short Term Goals   Additional Short Term Goals Yes   OT SHORT TERM GOAL #6   Title Patient will decrease fascial restrictions in right hand to trace for increased mobility in hand needed for fine motor tasks.    Time 4   Period Weeks   Status New   OT SHORT TERM GOAL #7   Title Patient will have 2/10 pain or less in her right hand for greater ease when completing daily activities.    Time 4   Period Weeks   Status New                  Plan - 09/01/15 [id number]    Clinical Impression Statement A:  Patient is a 59 year old female with medical history significant for Right CTR, CVA with right side weakness, right shoulder surgery, back surgery, arthritis, [facility] joint arthritis.  Patient has been experiencing decreased coordination in her right hand and increased numbness for the past few months.  These deficits are causing increased difficulty with picking up items with her dominant hand, picking up change, and opening containers.     Pt will benefit from skilled therapeutic intervention in order to improve on the following deficits (Retired) Decreased coordination;Decreased strength;Increased fascial restricitons;Pain   Rehab Potential Good   OT Frequency 2x / week   OT Duration 4 weeks   OT Treatment/Interventions Self-care/ADL training;Neuromuscular education;Therapeutic exercise;Manual Therapy;Therapeutic activities;Patient/family education   Plan P:  Skilled OT intervention to improve on above mentioned deficits in order to return to prior level of independence with B/IADLs, work, and leisure activities.  Next visit manual therapy to carpal tunnel region of  right hand, median and ulnar nerve glides, tendon glides, begin fine motor coordination training.     Consulted and Agree with Plan of Care Patient        Problem List Patient Active Problem List   Diagnosis Date Noted  . Postoperative pulmonary edema ([place]) 08/15/2014  . 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 Primary osteoarthritis of right knee 07/27/2014  . OA (osteoarthritis) of knee 06/16/2014  . Idiopathic parathyroidism ([place]) 06/16/2014  . Tinea versicolor 06/16/2014  . Obesity, unspecified 04/06/2013  . Lack of coordination 10/21/2012  . CVA (cerebral infarction) 10/10/2012  . Vitamin D deficiency 11/10/2011  . Hearing loss 06/29/2011  . Carotid bruit 06/29/2011  . BACK PAIN, LUMBAR, WITH RADICULOPATHY 06/28/2010  . SCIATICA, LEFT 05/11/2010  . Major depression ([place]) 04/04/2010  . PERIPHERAL EDEMA 04/08/2009  . DEGENERATIVE JOINT DISEASE 03/23/2008  . Hyperlipidemia 11/18/2007  . INSOMNIA 10/17/2007  . Asthma 11/30/2006  . Essential hypertension 09/05/2006  . ARTHRITIS 09/05/2006  . VERTIGO 09/05/2006    Teresa Soto, Teresa Soto [phone]  09/01/2015, 8:34 AM  [place], [place], [address] Phone: [phone]   Fax:  [phone]  Name: Teresa Soto MRN: [id number] Date of Birth: 01/29/1956

## 2015-09-01 NOTE — Telephone Encounter (Signed)
She has to work late and can not come in today, NF

## 2015-09-06 ENCOUNTER — Ambulatory Visit (HOSPITAL_COMMUNITY): Payer: BLUE CROSS/BLUE SHIELD | Admitting: Physical Therapy

## 2015-09-06 ENCOUNTER — Other Ambulatory Visit: Payer: Self-pay | Admitting: Family Medicine

## 2015-09-06 DIAGNOSIS — Z7409 Other reduced mobility: Secondary | ICD-10-CM

## 2015-09-06 DIAGNOSIS — R200 Anesthesia of skin: Secondary | ICD-10-CM

## 2015-09-06 DIAGNOSIS — R202 Paresthesia of skin: Secondary | ICD-10-CM

## 2015-09-06 DIAGNOSIS — R279 Unspecified lack of coordination: Principal | ICD-10-CM

## 2015-09-06 DIAGNOSIS — M25561 Pain in right knee: Secondary | ICD-10-CM

## 2015-09-06 DIAGNOSIS — R29898 Other symptoms and signs involving the musculoskeletal system: Secondary | ICD-10-CM | POA: Diagnosis not present

## 2015-09-06 DIAGNOSIS — R278 Other lack of coordination: Secondary | ICD-10-CM

## 2015-09-06 DIAGNOSIS — M25661 Stiffness of right knee, not elsewhere classified: Secondary | ICD-10-CM

## 2015-09-06 DIAGNOSIS — R2681 Unsteadiness on feet: Secondary | ICD-10-CM

## 2015-09-06 NOTE — Therapy (Signed)
[place], [place], [address] Phone: [phone]   Fax:  [phone]  Physical Therapy Treatment  Patient Details  Name: Teresa Soto MRN: [id number] Date of Birth: Feb 08, 1956 Referring Provider: Raliegh Ip. [place]  Encounter Date: 09/06/2015      PT End of Session - 09/06/15 1553    Visit Number 6   Number of Visits 8   Date for PT Re-Evaluation 09/08/15   Authorization Type BCBS   Authorization Time Period 08/09/15-10/09/15   PT Start Time 1525   PT Stop Time 1615   PT Time Calculation (min) 50 min   Activity Tolerance Patient tolerated treatment well   Behavior During Therapy [facility] for tasks assessed/performed      Past Medical History  Diagnosis Date  . Vertigo   . Hypertension   . Asthma   . Neuropathic pain   . Restless legs syndrome 2007 approx  . Depression   . Chronic back pain   . Stroke ([place])   . Spinal headache   . 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 Meniere disease     Past Surgical History  Procedure Laterality Date  . Back surgery    . Tonsillectomy    . Cesarean section      x 2  . Shoulder arthroscopy w/ rotator cuff repair      to right shoulder  . Tubal ligation    . Spine surgery  12/2010    ruptd L1 L2 , Dr Carloyn Manner  . Total knee arthroplasty Right 08/12/2014    Procedure: RIGHT TOTAL KNEE ARTHROPLASTY;  Surgeon: Carole Civil, MD;  Location: [facility];  Service: Orthopedics;  Laterality: Right;    There were no vitals filed for this visit.  Visit Diagnosis:  Decreased coordination  Numbness and tingling in right hand  Unsteadiness  Decreased functional mobility and endurance  Right knee pain  Weakness of right lower extremity  Knee stiffness, right  Weakness of both legs      Subjective Assessment - 09/06/15 1600    Subjective Pt states she has had a busy holiday weekend.  States she is currently having the same amount of pain into her back at 4/10.    Currently in Pain? Yes   Pain Score 4    Pain Location Back   Pain Orientation Right                         [facility] Adult PT Treatment/Exercise - 09/06/15 1527    Knee/Hip Exercises: Stretches   Active Hamstring Stretch Both;3 reps;30 seconds   Knee/Hip Exercises: Standing   Forward Lunges 10 reps   Forward Lunges Limitations bilaterally onto 4"    Side Lunges 10 reps   Side Lunges Limitations bilaterally onto 4" step   Functional Squat 10 reps   Knee/Hip Exercises: Supine   Bridges Limitations 15 reps   Straight Leg Raises Both;15 reps   Knee/Hip Exercises: Sidelying   Hip ABduction Both;15 reps   Knee/Hip Exercises: Prone   Hip Extension Both;15 reps                  PT Short Term Goals - 08/10/15 [id number]    PT SHORT TERM GOAL #1   Title Pt will be independent with HEP for BLE strengthening and balance training.   Time 2   Period Weeks   Status New   PT SHORT TERM GOAL #2  Title Improve bilateral hip extensor strength to 4/5 or greater to improve functional mobility.   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    Time 2   Period Weeks   Status New           PT Long Term Goals - 08/10/15 0740    PT LONG TERM GOAL #1   Title Pt will be independent with advanced HEP for BLE strengthening and balance, to be updated PRN.    Time 4   Period Weeks   Status New   PT LONG TERM GOAL #2   Title Improve bilateral hip strength to 4+/5 to improve stability and functional mobility.   Time 4   Period Weeks   Status New   PT LONG TERM GOAL #3   Title Pt will complete floor to stand transfers x 5 with proper form and no LOB to allow her to pick things up off of the floor at work with decreased risk for falls.    Time 4   Period Weeks   Status New               Plan - 09/06/15  1555    Clinical Impression Statement Progressed stability exercises to include lunges and squats without use of UE's. Pt able to complete with minimal cues in good form.  No pain or complaints during session today.  Overall doing well functionally with minimal complaints at this point.     PT Next Visit Plan Re-evaluate in 2 more sessions.  continue to progress towards goals.         Problem List Patient Active Problem List   Diagnosis Date Noted  . Postoperative pulmonary edema ([place]) 08/15/2014  . 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 Primary osteoarthritis of right knee 07/27/2014  . OA (osteoarthritis) of knee 06/16/2014  . Idiopathic parathyroidism ([place]) 06/16/2014  . Tinea versicolor 06/16/2014  . Obesity, unspecified 04/06/2013  . Lack of coordination 10/21/2012  . CVA (cerebral infarction) 10/10/2012  . Vitamin D deficiency 11/10/2011  . Hearing loss 06/29/2011  . Carotid bruit 06/29/2011  . BACK PAIN, LUMBAR, WITH RADICULOPATHY 06/28/2010  . SCIATICA, LEFT 05/11/2010  . Major depression ([place]) 04/04/2010  . PERIPHERAL EDEMA 04/08/2009  . DEGENERATIVE JOINT DISEASE 03/23/2008  . Hyperlipidemia 11/18/2007  . INSOMNIA 10/17/2007  . Asthma 11/30/2006  . Essential hypertension 09/05/2006  . ARTHRITIS 09/05/2006  . VERTIGO 09/05/2006    Teena Irani, PTA/CLT [phone]  09/06/2015, 4:02 PM  [place] [address] [place], [place], [address] Phone: [phone]   Fax:  [phone]  Name: Teresa Soto MRN: [id number] Date of Birth: [birth year]

## 2015-09-06 NOTE — Telephone Encounter (Signed)
Refill appropriate and filled per protocol. 

## 2015-09-08 ENCOUNTER — Ambulatory Visit (HOSPITAL_COMMUNITY): Payer: BLUE CROSS/BLUE SHIELD | Admitting: Physical Therapy

## 2015-09-08 ENCOUNTER — Telehealth (HOSPITAL_COMMUNITY): Payer: Self-pay | Admitting: Physical Therapy

## 2015-09-08 NOTE — Telephone Encounter (Signed)
Patient no show for today's appt. Called and left message regarding next appt time.  [place]

## 2015-09-13 ENCOUNTER — Ambulatory Visit (HOSPITAL_COMMUNITY): Payer: BLUE CROSS/BLUE SHIELD | Attending: Family Medicine | Admitting: Specialist

## 2015-09-13 DIAGNOSIS — R279 Unspecified lack of coordination: Secondary | ICD-10-CM | POA: Insufficient documentation

## 2015-09-13 DIAGNOSIS — R202 Paresthesia of skin: Secondary | ICD-10-CM | POA: Insufficient documentation

## 2015-09-13 DIAGNOSIS — R200 Anesthesia of skin: Secondary | ICD-10-CM

## 2015-09-13 DIAGNOSIS — R2681 Unsteadiness on feet: Secondary | ICD-10-CM | POA: Diagnosis present

## 2015-09-13 DIAGNOSIS — R278 Other lack of coordination: Secondary | ICD-10-CM

## 2015-09-13 DIAGNOSIS — Z7409 Other reduced mobility: Secondary | ICD-10-CM | POA: Insufficient documentation

## 2015-09-13 NOTE — Therapy (Signed)
[place], [place], [address] Phone: [phone]   Fax:  [phone]  Occupational Therapy Treatment  Patient Details  Name: Teresa Soto MRN: [id number] Date of Birth: 1956/06/04 Referring Provider: Dr. Buelah Manis  Encounter Date: 09/13/2015      OT End of Session - 09/13/15 1344    Visit Number 2   Number of Visits 8   Date for OT Re-Evaluation 10/31/15   Authorization Type BCBS   Authorization Time Period 50 days per calendar year also recieving PT   Authorization - Visit Number 9   Authorization - Number of Visits 50   OT Start Time 1306   OT Stop Time 1345   OT Time Calculation (min) 39 min   Activity Tolerance Patient tolerated treatment well   Behavior During Therapy [facility] for tasks assessed/performed      Past Medical History  Diagnosis Date  . Vertigo   . Hypertension   . Asthma   . Neuropathic pain   . Restless legs syndrome 2007 approx  . Depression   . Chronic back pain   . Stroke ([place])   . Spinal headache   . 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 Meniere disease     Past Surgical History  Procedure Laterality Date  . Back surgery    . Tonsillectomy    . Cesarean section      x 2  . Shoulder arthroscopy w/ rotator cuff repair      to right shoulder  . Tubal ligation    . Spine surgery  12/2010    ruptd L1 L2 , Dr Carloyn Manner  . Total knee arthroplasty Right 08/12/2014    Procedure: RIGHT TOTAL KNEE ARTHROPLASTY;  Surgeon: Carole Civil, MD;  Location: [facility];  Service: Orthopedics;  Laterality: Right;    There were no vitals filed for this visit.  Visit Diagnosis:  Decreased coordination  Numbness and tingling in right hand      Subjective Assessment - 09/13/15 1307    Subjective  S:  I had to take a pain pill this morning.    Currently in Pain? Yes   Pain Score 2    Pain Location Hand   Pain Descriptors / Indicators Aching   Pain Type Acute pain            [facility] OT  Assessment - 09/13/15 0001    Assessment   Diagnosis Right Fine Motor Coordination Deficit   Precautions   Precautions None                  OT Treatments/Exercises (OP) - 09/13/15 0001    Exercises   Exercises Wrist;Hand   Fine Motor Coordination   Tendon Glides 10 times, median nerve glides 5 times, ulnar nerve glides 3 times   Additional Wrist Exercises   Theraputty Flatten;Roll;Grip;Locate Pegs;Pinch   Theraputty - Flatten red   Theraputty - Roll red   Theraputty - Grip red   Theraputty - Pinch red   Theraputty - Locate Pegs 10 beads red    Hand Exercises   MCPJ Extension AROM;10 reps   Digit Composite ABduction AROM;10 reps   Digit Composite ADduction AROM;10 reps   Other Hand Exercises in hand manipulation with small beads able to pick up beads and translate to hand 5 first attemptm 1 second attempt 10, third attempt 8 fourth attempt and 7 fifth attemptk.  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   Myofascial Release MFR and manual stretching to right flexor and extensor forearm, wrist, and hand to decrease fascial restrictions                   OT Short Term Goals - 09/13/15 1348    OT SHORT TERM GOAL #1   Title Patient will be educated on a HEP for coordination training and sensory reintegration.   Time 4   Period Weeks   Status On-going   OT SHORT TERM GOAL #2   Title Patient will improve right 3 point pinch strength by 2# for improved abilty to pick up cups.   Time 4   Period Weeks   Status On-going   OT SHORT TERM GOAL #3   Title Patient will improve right hand fine motor coordination by decreasing completion time on nine hole peg test by 4" or more for increased ability to pick up change at work.    Time 4   Period  Weeks   Status On-going   OT SHORT TERM GOAL #4   Title Patient will improve sensation in right hand from diminished light touch to normal for increased ability to pick up change at work.    Time 4   Period Weeks   Status On-going   OT SHORT TERM GOAL #5   Title Patient will increase webspace to 2" in right hand for greater dexterity in right hand needed for coordination activities.   Time 4   Period Weeks   Status On-going   OT SHORT TERM GOAL #6   Title Patient will decrease fascial restrictions in right hand to trace for increased mobility in hand needed for fine motor tasks.    Time 4   Period Weeks   Status On-going   OT SHORT TERM GOAL #7   Title Patient will have 2/10 pain or less in her right hand for greater ease when completing daily activities.    Time 4   Period Weeks   Status On-going                  Plan - 09/13/15 1344    Clinical Impression Statement A:  Inititated therapeutic exercises and manual therapy in order to decrease numnbess and improve ability to complete job duties.    Plan P:  Increase amount of pegs able to pick up and translate to palm without dropping pegs.  review treatment plan and goals and issue a copy to patient.         Problem List Patient Active Problem List   Diagnosis Date Noted  . Postoperative pulmonary edema ([place]) 08/15/2014  . 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 Primary osteoarthritis of right knee 07/27/2014  . OA (osteoarthritis) of knee 06/16/2014  . Idiopathic parathyroidism ([place]) 06/16/2014  . Tinea versicolor 06/16/2014  . Obesity, unspecified 04/06/2013  . Lack of coordination 10/21/2012  . CVA (cerebral infarction) 10/10/2012  . Vitamin D deficiency 11/10/2011  . Hearing loss 06/29/2011  . Carotid bruit 06/29/2011  . BACK PAIN, LUMBAR, WITH RADICULOPATHY 06/28/2010  . SCIATICA, LEFT 05/11/2010  . Major depression ([place]) 04/04/2010  . PERIPHERAL EDEMA 04/08/2009  . DEGENERATIVE JOINT DISEASE  03/23/2008  . Hyperlipidemia 11/18/2007  . INSOMNIA 10/17/2007  . Asthma 11/30/2006  . Essential hypertension  09/05/2006  . ARTHRITIS 09/05/2006  . VERTIGO 09/05/2006    Vangie Bicker, OTR/L [phone]  09/13/2015, 1:49 PM  [place] [address] [place], [place], [address] Phone: [phone]   Fax:  [phone]  Name: Teresa Soto MRN: [id number] Date of Birth: 1955/12/07

## 2015-09-14 ENCOUNTER — Ambulatory Visit (HOSPITAL_COMMUNITY): Payer: BLUE CROSS/BLUE SHIELD | Admitting: Physical Therapy

## 2015-09-14 DIAGNOSIS — Z7409 Other reduced mobility: Secondary | ICD-10-CM

## 2015-09-14 DIAGNOSIS — R279 Unspecified lack of coordination: Secondary | ICD-10-CM | POA: Diagnosis not present

## 2015-09-14 DIAGNOSIS — R2681 Unsteadiness on feet: Secondary | ICD-10-CM

## 2015-09-14 NOTE — Therapy (Signed)
[place], [place], [address] Phone: [phone]   Fax:  [phone]  Physical Therapy Treatment  Patient Details  Name: Teresa Soto MRN: [id number] Date of Birth: Apr 12, 1956 Referring Provider: Raliegh Ip. [place]elberry  Encounter Date: 09/14/2015      PT End of Session - 09/14/15 1604    Visit Number 7   Number of Visits 8   Authorization Type BCBS   Authorization Time Period 08/09/15-10/09/15   PT Start Time 1522   PT Stop Time 1600   PT Time Calculation (min) 38 min   Activity Tolerance Patient tolerated treatment well   Behavior During Therapy WFL for tasks assessed/performed      Past Medical History  Diagnosis Date  . Vertigo   . Hypertension   . Asthma   . Neuropathic pain   . Restless legs syndrome 2007 approx  . Depression   . Chronic back pain   . Stroke ([place])   . Spinal headache   . 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 Meniere disease     Past Surgical History  Procedure Laterality Date  . Back surgery    . Tonsillectomy    . Cesarean section      x 2  . Shoulder arthroscopy w/ rotator cuff repair      to right shoulder  . Tubal ligation    . Spine surgery  12/2010    ruptd L1 L2 , Dr Carloyn Manner  . Total knee arthroplasty Right 08/12/2014    Procedure: RIGHT TOTAL KNEE ARTHROPLASTY;  Surgeon: Carole Civil, MD;  Location: [facility];  Service: Orthopedics;  Laterality: Right;    There were no vitals filed for this visit.  Visit Diagnosis:  Unsteadiness  Decreased functional mobility and endurance      Subjective Assessment - 09/14/15 1525    Subjective Pt reports that she feels that she doesn't really need PT, she really needs OT.   Currently in Pain? Yes   Pain Score 5    Pain Location --  hands, knees, and back            [facility] PT Assessment - 09/14/15 0001    Observation/Other Assessments   Focus on Therapeutic Outcomes (FOTO)  11% limited   Floor to Stand   Comments  performed x 5 with no pain or LOB   Strength   Right Hip Flexion 4+/5   Right Hip Extension 4/5   Right Hip ABduction 4/5   Left Hip Flexion 4+/5   Left Hip Extension 4/5   Left Hip ABduction 4/5   Right Knee Flexion 4+/5   Right Knee Extension 5/5   Left Knee Flexion 5/5   Left Knee Extension 5/5                     [facility] Adult PT Treatment/Exercise - 09/14/15 0001    Transfers   Transfers Floor to Transfer   Floor to Transfer 7: Independent  performed x 5   Knee/Hip Exercises: Standing   Other Standing Knee Exercises sidestepping with GTB x 2RT             Balance Exercises - 09/14/15 1602    Balance Exercises: Standing   Tandem Gait Forward;Retro;2 reps   [facility]one Rotation Limitations 10 lb weight rotation x 10             PT Short Term Goals - 09/14/15 1539    PT SHORT TERM  GOAL #1   Title Pt will be independent with HEP for BLE strengthening and balance training.   Time 2   Period Weeks   Status Achieved   PT SHORT TERM GOAL #2   Title Improve bilateral hip extensor strength to 4/5 or greater to improve functional mobility.   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    Baseline 09/13/15- transferred 10lb weight from side to side x 10           PT Long Term Goals - 09/14/15 1539    PT LONG TERM GOAL #1   Title Pt will be independent with advanced HEP for BLE strengthening and balance, to be updated PRN.    Time 4   Period Weeks   Status Achieved   PT LONG TERM GOAL #2   Title Improve bilateral hip strength to 4+/5 to improve stability and functional mobility.   Time 4   Period Weeks   Status Partially Met   PT LONG TERM GOAL #3   Title Pt will complete floor to stand transfers x 5 with proper form and no LOB to allow her to pick things up off of the floor at work  with decreased risk for falls.    Time 4   Period Weeks   Status Achieved               Plan - 09/14/15 1604    Clinical Impression Statement Reassessment was completed today. Pt is now able to complete floor to stand transfers with confidence and without LOB, demonstrates improved BLE strenght, is able to transfer objects from side to side without LOB, and is able to work full shift without increased fatigue or balance problems. Pt has met her LTGs and is being discharged to continue independently with HEP.    PT Next Visit Plan pt discharged to HEP.        Problem List Patient Active Problem List   Diagnosis Date Noted  . Postoperative pulmonary edema ([place]) 08/15/2014  . 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 Primary osteoarthritis of right knee 07/27/2014  . OA (osteoarthritis) of knee 06/16/2014  . Idiopathic parathyroidism ([place]) 06/16/2014  . Tinea versicolor 06/16/2014  . Obesity, unspecified 04/06/2013  . Lack of coordination 10/21/2012  . CVA (cerebral infarction) 10/10/2012  . Vitamin D deficiency 11/10/2011  . Hearing loss 06/29/2011  . Carotid bruit 06/29/2011  . BACK PAIN, LUMBAR, WITH RADICULOPATHY 06/28/2010  . SCIATICA, LEFT 05/11/2010  . Major depression ([place]) 04/04/2010  . PERIPHERAL EDEMA 04/08/2009  . DEGENERATIVE JOINT DISEASE 03/23/2008  . Hyperlipidemia 11/18/2007  . INSOMNIA 10/17/2007  . Asthma 11/30/2006  . Essential hypertension 09/05/2006  . ARTHRITIS 09/05/2006  . VERTIGO 09/05/2006     PHYSICAL THERAPY DISCHARGE SUMMARY  Visits from Start of Care: 7  Current functional level related to goals / functional outcomes: Pt demonstrates improvements in functional strength, balance, functional mobility, and functional activity tolerance.    Remaining deficits: None at this time.    Education / Equipment: HEP  Plan: Patient agrees to discharge.  Patient goals were met. Patient is being discharged due to meeting the stated rehab  goals.  ?????       Hilma Favors, PT, DPT [phone] 09/14/2015, 4:08 PM  [place], [place], [address] Phone: [phone]   Fax:  [phone]  Name: Teresa Soto MRN: [id number] Date of Birth: 02-16-1956

## 2015-09-15 ENCOUNTER — Ambulatory Visit (HOSPITAL_COMMUNITY): Payer: BLUE CROSS/BLUE SHIELD | Admitting: Specialist

## 2015-09-15 ENCOUNTER — Telehealth (HOSPITAL_COMMUNITY): Payer: Self-pay | Admitting: Specialist

## 2015-09-15 NOTE — Telephone Encounter (Signed)
She is short staffed at work and can not come in

## 2015-09-16 ENCOUNTER — Encounter (HOSPITAL_COMMUNITY): Payer: BLUE CROSS/BLUE SHIELD | Admitting: Physical Therapy

## 2015-09-20 ENCOUNTER — Ambulatory Visit (HOSPITAL_COMMUNITY): Payer: BLUE CROSS/BLUE SHIELD | Admitting: Specialist

## 2015-09-20 DIAGNOSIS — R279 Unspecified lack of coordination: Secondary | ICD-10-CM | POA: Diagnosis not present

## 2015-09-20 DIAGNOSIS — R278 Other lack of coordination: Secondary | ICD-10-CM

## 2015-09-20 NOTE — Therapy (Signed)
[place], [place], [address] Phone: [phone]   Fax:  [phone]  Occupational Therapy Treatment  Patient Details  Name: Teresa Soto MRN: [id number] Date of Birth: 1956-08-31 Referring Provider: Dr. Buelah Manis  Encounter Date: 09/20/2015      OT End of Session - 09/20/15 1429    Visit Number 3   Number of Visits 8   Date for OT Re-Evaluation 10/31/15  mini reassess on 09/30/15   Authorization Type BCBS   Authorization Time Period 50 days per calendar year also recieving PT   Authorization - Visit Number 10   Authorization - Number of Visits 50   OT Start Time 1351   OT Stop Time 1430   OT Time Calculation (min) 39 min   Activity Tolerance Patient tolerated treatment well   Behavior During Therapy [facility] for tasks assessed/performed      Past Medical History  Diagnosis Date  . Vertigo   . Hypertension   . Asthma   . Neuropathic pain   . Restless legs syndrome 2007 approx  . Depression   . Chronic back pain   . Stroke ([place])   . Spinal headache   . 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 Meniere disease     Past Surgical History  Procedure Laterality Date  . Back surgery    . Tonsillectomy    . Cesarean section      x 2  . Shoulder arthroscopy w/ rotator cuff repair      to right shoulder  . Tubal ligation    . Spine surgery  12/2010    ruptd L1 L2 , Dr Carloyn Manner  . Total knee arthroplasty Right 08/12/2014    Procedure: RIGHT TOTAL KNEE ARTHROPLASTY;  Surgeon: Carole Civil, MD;  Location: [facility];  Service: Orthopedics;  Laterality: Right;    There were no vitals filed for this visit.  Visit Diagnosis:  Decreased coordination      Subjective Assessment - 09/20/15 1351    Subjective  S:  I could hardly use my right arm today at work.     Currently in Pain? Yes   Pain Score 4    Pain Location Hand   Pain Descriptors / Indicators Aching   Pain Type Acute pain            [facility] OT  Assessment - 09/20/15 0001    Assessment   Diagnosis Right Fine Motor Coordination Deficit   Precautions   Precautions None                  OT Treatments/Exercises (OP) - 09/20/15 0001    Exercises   Exercises Wrist;Hand   Fine Motor Coordination   Tendon Glides 10 times, median nerve glides 5 times, ulnar nerve glides 3 times   Additional Wrist Exercises   Theraputty Flatten;Roll;Grip;Locate Pegs;Pinch   Theraputty - Flatten red  pulling pvc pipe through while maintaining position of wrist   Theraputty - Roll red   Theraputty - Grip red  with arm supinated and pronated   Theraputty - Pinch red   Theraputty - Locate Pegs 10 beads red    Hand Exercises   Other Hand Exercises in hand manipulation with small beads able to pick up beads and translate to hand 5 first attempting8 fist attempt 10 second attempt 5 third attempt, 10 third attempt, and 10 fourth attm  Completed against pink shoe box lid 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   Myofascial Release MFR and manual stretching to right flexor and extensor forearm, wrist, and hand to decrease fascial restrictions                   OT Short Term Goals - 09/13/15 1348    OT SHORT TERM GOAL #1   Title Patient will be educated on a HEP for coordination training and sensory reintegration.   Time 4   Period Weeks   Status On-going   OT SHORT TERM GOAL #2   Title Patient will improve right 3 point pinch strength by 2# for improved abilty to pick up cups.   Time 4   Period Weeks   Status On-going   OT SHORT TERM GOAL #3   Title Patient will improve right hand fine motor coordination by decreasing completion time on nine hole peg test by 4" or more for increased ability to pick up change at work.    Time 4   Period Weeks   Status On-going   OT  SHORT TERM GOAL #4   Title Patient will improve sensation in right hand from diminished light touch to normal for increased ability to pick up change at work.    Time 4   Period Weeks   Status On-going   OT SHORT TERM GOAL #5   Title Patient will increase webspace to 2" in right hand for greater dexterity in right hand needed for coordination activities.   Time 4   Period Weeks   Status On-going   OT SHORT TERM GOAL #6   Title Patient will decrease fascial restrictions in right hand to trace for increased mobility in hand needed for fine motor tasks.    Time 4   Period Weeks   Status On-going   OT SHORT TERM GOAL #7   Title Patient will have 2/10 pain or less in her right hand for greater ease when completing daily activities.    Time 4   Period Weeks   Status On-going                  Plan - 09/20/15 1430    Clinical Impression Statement A:  increased ease with in hand manipulation exercisese this date   Plan P:  incrase to green theraputty with strengthening.  Add tweezer fine motor activities.         Problem List Patient Active Problem List   Diagnosis Date Noted  . Postoperative pulmonary edema ([place]) 08/15/2014  . 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 Primary osteoarthritis of right knee 07/27/2014  . OA (osteoarthritis) of knee 06/16/2014  . Idiopathic parathyroidism ([place]) 06/16/2014  . Tinea versicolor 06/16/2014  . Obesity, unspecified 04/06/2013  . Lack of coordination 10/21/2012  . CVA (cerebral infarction) 10/10/2012  . Vitamin D deficiency 11/10/2011  . Hearing loss 06/29/2011  . Carotid bruit 06/29/2011  . BACK PAIN, LUMBAR, WITH RADICULOPATHY 06/28/2010  . SCIATICA, LEFT 05/11/2010  . Major depression ([place]) 04/04/2010  . PERIPHERAL EDEMA 04/08/2009  . DEGENERATIVE JOINT DISEASE 03/23/2008  . Hyperlipidemia 11/18/2007  . INSOMNIA 10/17/2007  . Asthma 11/30/2006  . Essential hypertension 09/05/2006  . ARTHRITIS 09/05/2006  . VERTIGO  09/05/2006    Vangie Bicker, OTR/L [phone]  09/20/2015, 2:32 PM  [facility]  [place], [place], [address] Phone: [phone]   Fax:  [phone]  Name: Teresa Soto MRN: [id number] Date of Birth: 1956-06-29

## 2015-09-22 ENCOUNTER — Ambulatory Visit (HOSPITAL_COMMUNITY): Payer: BLUE CROSS/BLUE SHIELD | Admitting: Specialist

## 2015-09-22 DIAGNOSIS — R279 Unspecified lack of coordination: Principal | ICD-10-CM

## 2015-09-22 DIAGNOSIS — R278 Other lack of coordination: Secondary | ICD-10-CM

## 2015-09-22 NOTE — Therapy (Signed)
[place], [place], [address] Phone: [phone]   Fax:  [phone]  Occupational Therapy Treatment  Patient Details  Name: Teresa Soto MRN: [id number] Date of Birth: 09-30-1956 Referring Provider: Dr. Buelah Manis  Encounter Date: 09/22/2015      OT End of Session - 09/22/15 1429    Visit Number 4   Number of Visits 8   Date for OT Re-Evaluation 10/31/15  09/30/15 mini reassessment   Authorization Type BCBS   Authorization Time Period 50 days per calendar year also recieving PT   Authorization - Visit Number 11   Authorization - Number of Visits 50   OT Start Time 1300   OT Stop Time 1345   OT Time Calculation (min) 45 min   Activity Tolerance Patient tolerated treatment well   Behavior During Therapy [facility] for tasks assessed/performed      Past Medical History  Diagnosis Date  . Vertigo   . Hypertension   . Asthma   . Neuropathic pain   . Restless legs syndrome 2007 approx  . Depression   . Chronic back pain   . Stroke ([place])   . Spinal headache   . 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 Meniere disease     Past Surgical History  Procedure Laterality Date  . Back surgery    . Tonsillectomy    . Cesarean section      x 2  . Shoulder arthroscopy w/ rotator cuff repair      to right shoulder  . Tubal ligation    . Spine surgery  12/2010    ruptd L1 L2 , Dr Carloyn Manner  . Total knee arthroplasty Right 08/12/2014    Procedure: RIGHT TOTAL KNEE ARTHROPLASTY;  Surgeon: Carole Civil, MD;  Location: [facility];  Service: Orthopedics;  Laterality: Right;    There were no vitals filed for this visit.  Visit Diagnosis:  Decreased coordination          [facility] OT Assessment - 09/22/15 0001    Assessment   Diagnosis Right Fine Motor Coordination Deficit   Precautions   Precautions None                  OT Treatments/Exercises (OP) - 09/22/15 0001    Exercises   Exercises  Shoulder;Wrist;Hand   Shoulder Exercises: Standing   Extension Theraband;10 reps   Theraband Level (Shoulder Extension) Level 3 (Green)   Row Theraband;15 reps   Theraband Level (Shoulder Row) Level 3 (Green)   Retraction Theraband;10 reps   Theraband Level (Shoulder Retraction) Level 3 (Green)   Shoulder Exercises: ROM/Strengthening   Proximal Shoulder Strengthening, Seated 10 times without resting   Other ROM/Strengthening Exercises graduated pinch tree without difficulty   Fine Motor Coordination   Fine Motor Coordination In hand manipuation training;Digit   In Hand Manipulation Training picked up pennies, translated to palm then back to pincer grasp to place pennies into bank held vertically with min difficulty   Large Pegboard Perfection game:  placed pegs in with min difficulty untimed, time able to place 12 and then 13 in 10 seconds.  removed all pegs and translated to palm with min difficulty   Hand Exercises   Other Hand Exercises screw board removed bolts without difficutly.  min difficulty screwing the nuts back in place.     Other Hand Exercises fine motor functional board:  large hook clasp not challenging.  smaller hooks on key ring were moderately  challenging.    Manual Therapy   Manual Therapy Myofascial release   Manual therapy comments manual therapy completed prior to all other interventions this date.   Myofascial Release MFR and manual stretching to right flexor and extensor forearm, wrist, and hand to decrease fascial restrictions                   OT Short Term Goals - 09/13/15 1348    OT SHORT TERM GOAL #1   Title Patient will be educated on a HEP for coordination training and sensory reintegration.   Time 4   Period Weeks   Status On-going   OT SHORT TERM GOAL #2   Title Patient will improve right 3 point pinch strength by 2# for improved abilty to pick up cups.   Time 4   Period Weeks   Status On-going   OT SHORT TERM GOAL #3   Title Patient  will improve right hand fine motor coordination by decreasing completion time on nine hole peg test by 4" or more for increased ability to pick up change at work.    Time 4   Period Weeks   Status On-going   OT SHORT TERM GOAL #4   Title Patient will improve sensation in right hand from diminished light touch to normal for increased ability to pick up change at work.    Time 4   Period Weeks   Status On-going   OT SHORT TERM GOAL #5   Title Patient will increase webspace to 2" in right hand for greater dexterity in right hand needed for coordination activities.   Time 4   Period Weeks   Status On-going   OT SHORT TERM GOAL #6   Title Patient will decrease fascial restrictions in right hand to trace for increased mobility in hand needed for fine motor tasks.    Time 4   Period Weeks   Status On-going   OT SHORT TERM GOAL #7   Title Patient will have 2/10 pain or less in her right hand for greater ease when completing daily activities.    Time 4   Period Weeks   Status On-going                  Plan - 09/22/15 1429    Clinical Impression Statement A:  Paitent had increased difficulty with small fine motor tasks completed this date.  requested that sessions focus on fine motor.  Therapist educated that proximal shoulder strengthening will assist in improving fine motor coordination.   Plan P:  tweezer fine motor task, small buttoning activity.        Problem List Patient Active Problem List   Diagnosis Date Noted  . Postoperative pulmonary edema ([place]) 08/15/2014  . 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 Primary osteoarthritis of right knee 07/27/2014  . OA (osteoarthritis) of knee 06/16/2014  . Idiopathic parathyroidism ([place]) 06/16/2014  . Tinea versicolor 06/16/2014  . Obesity, unspecified 04/06/2013  . Lack of coordination 10/21/2012  . CVA (cerebral infarction) 10/10/2012  . Vitamin D deficiency 11/10/2011  . Hearing loss 06/29/2011  . Carotid bruit  06/29/2011  . BACK PAIN, LUMBAR, WITH RADICULOPATHY 06/28/2010  . SCIATICA, LEFT 05/11/2010  . Major depression ([place]) 04/04/2010  . PERIPHERAL EDEMA 04/08/2009  . DEGENERATIVE JOINT DISEASE 03/23/2008  . Hyperlipidemia 11/18/2007  . INSOMNIA 10/17/2007  . Asthma 11/30/2006  . Essential hypertension 09/05/2006  . ARTHRITIS 09/05/2006  . VERTIGO 09/05/2006    Vangie Bicker,  OTR/L [phone]  09/22/2015, 2:32 PM  [place] [address] [place], [place], [address] Phone: [phone]   Fax:  [phone]  Name: Teresa Soto MRN: [id number] Date of Birth: 1956/08/28

## 2015-09-29 ENCOUNTER — Other Ambulatory Visit: Payer: Self-pay | Admitting: Family Medicine

## 2015-09-30 ENCOUNTER — Ambulatory Visit (HOSPITAL_COMMUNITY): Payer: BLUE CROSS/BLUE SHIELD | Admitting: Occupational Therapy

## 2015-09-30 ENCOUNTER — Encounter (HOSPITAL_COMMUNITY): Payer: Self-pay | Admitting: Occupational Therapy

## 2015-09-30 DIAGNOSIS — R279 Unspecified lack of coordination: Secondary | ICD-10-CM

## 2015-09-30 DIAGNOSIS — R278 Other lack of coordination: Secondary | ICD-10-CM

## 2015-09-30 NOTE — Telephone Encounter (Signed)
Refill appropriate and filled per protocol. 

## 2015-09-30 NOTE — Therapy (Addendum)
[place], [place], [address] Phone: [phone]   Fax:  [phone]  Occupational Therapy Reassessment and Treatment  Patient Details  Name: Teresa Soto MRN: [id number] Date of Birth: Mar 05, 1956 Referring Provider: Dr. Buelah Manis  Encounter Date: 09/30/2015      OT End of Session - 09/30/15 1526    Visit Number 5   Number of Visits 8   Date for OT Re-Evaluation 10/31/15   Authorization Type BCBS   Authorization Time Period 50 days per calendar year also recieving PT   Authorization - Visit Number 12   Authorization - Number of Visits 50   OT Start Time 3149   OT Stop Time 1516   OT Time Calculation (min) 45 min   Activity Tolerance Patient tolerated treatment well   Behavior During Therapy [facility] for tasks assessed/performed      Past Medical History  Diagnosis Date  . Vertigo   . Hypertension   . Asthma   . Neuropathic pain   . Restless legs syndrome 2007 approx  . Depression   . Chronic back pain   . Stroke ([place])   . Spinal headache   . 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 Meniere disease     Past Surgical History  Procedure Laterality Date  . Back surgery    . Tonsillectomy    . Cesarean section      x 2  . Shoulder arthroscopy w/ rotator cuff repair      to right shoulder  . Tubal ligation    . Spine surgery  12/2010    ruptd L1 L2 , Dr Carloyn Manner  . Total knee arthroplasty Right 08/12/2014    Procedure: RIGHT TOTAL KNEE ARTHROPLASTY;  Surgeon: Carole Civil, MD;  Location: [facility];  Service: Orthopedics;  Laterality: Right;    There were no vitals filed for this visit.  Visit Diagnosis:  Decreased coordination  Lack of coordination      Subjective Assessment - 09/30/15 1431    Subjective  S: The pain isn't going to go away because of the arthritis.    Currently in Pain? Yes   Pain Score 6    Pain Location Hand   Pain Orientation Right   Pain Descriptors / Indicators Aching   Pain Type Acute pain           [facility] OT Assessment - 09/30/15 1431    Assessment   Diagnosis Right Fine Motor Coordination Deficit   Precautions   Precautions None   Coordination   9 Hole Peg Test Right   Right 9 Hole Peg Test 23.4" (previous 21.31")   Other webspace is 1.75" in right hand and 2.0" in left hand  (same as previous)   Coordination difficult to oppose to small digit in right hand (same as previous)   Hand Function   Right Hand Grip (lbs) 65 (same as previous)   Right Hand Lateral Pinch 18 lbs (14 previous)   Right Hand 3 Point Pinch 14 lbs (10 previous)   Left Hand Grip (lbs) 65 (60 previous)   Left Hand Lateral Pinch 16 lbs (same as previous)   Left 3 point pinch 14 lbs (12 previous)              OT Treatments/Exercises (OP) - 09/30/15 1435    Exercises   Exercises Shoulder;Wrist;Hand   Fine Motor Coordination   Fine Motor Coordination Small Pegboard   Small Pegboard Pt completed small  pegboard task, using tweezers in right hand to place pegs. Pt completed with no difficulty.    Fine Motor Coordination   Fine Motor Coordination Grooved pegs   Grooved pegs Pt completed grooved pegboard task using tweezers in right hand to place pegs, no difficulty with task.    Manual Therapy   Manual Therapy Myofascial release   Manual therapy comments manual therapy completed prior to all other interventions this date.   Myofascial Release MFR and manual stretching to right flexor and extensor forearm, wrist, and hand to decrease fascial restrictions                  OT Short Term Goals - 09/30/15 1510    OT SHORT TERM GOAL #1   Title Patient will be educated on a HEP for coordination training and sensory reintegration.   Time 4   Period Weeks   Status On-going   OT SHORT TERM GOAL #2   Title Patient will improve right 3 point pinch strength by 2# for improved abilty to pick up cups.   Time 4   Period Weeks   Status Achieved   OT SHORT TERM  GOAL #3   Title Patient will improve right hand fine motor coordination by decreasing completion time on nine hole peg test by 4" or more for increased ability to pick up change at work.    Time 4   Period Weeks   Status On-going   OT SHORT TERM GOAL #4   Title Patient will improve sensation in right hand from diminished light touch to normal for increased ability to pick up change at work.    Time 4   Period Weeks   Status On-going   OT SHORT TERM GOAL #5   Title Patient will increase webspace to 2" in right hand for greater dexterity in right hand needed for coordination activities.   Time 4   Period Weeks   Status On-going   OT SHORT TERM GOAL #6   Title Patient will decrease fascial restrictions in right hand to trace for increased mobility in hand needed for fine motor tasks.    Time 4   Period Weeks   Status On-going   OT SHORT TERM GOAL #7   Title Patient will have 2/10 pain or less in her right hand for greater ease when completing daily activities.    Time 4   Period Weeks   Status On-going                  Plan - 09/30/15 1526    Clinical Impression Statement A: Mini-reassessment completed this session, pt has met 1/7 STGs. Pt has increased lateral and 3 point pinch strength in right hand, coordination has not improved. Pt completed tweezer tasks this session with small and grooved pegboard, no difficulty with either task.    Plan P: Fine motor tasks-small buttoning tasks, lacing task.         Problem List Patient Active Problem List   Diagnosis Date Noted  . Postoperative pulmonary edema ([place]) 08/15/2014  . 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 Primary osteoarthritis of right knee 07/27/2014  . OA (osteoarthritis) of knee 06/16/2014  . Idiopathic parathyroidism ([place]) 06/16/2014  . Tinea versicolor 06/16/2014  . Obesity, unspecified 04/06/2013  . Lack of coordination 10/21/2012  . CVA (cerebral infarction) 10/10/2012  . Vitamin D deficiency  11/10/2011  . Hearing loss 06/29/2011  . Carotid bruit 06/29/2011  . BACK PAIN, LUMBAR, WITH RADICULOPATHY  06/28/2010  . SCIATICA, LEFT 05/11/2010  . Major depression ([employer]) 04/04/2010  . PERIPHERAL EDEMA 04/08/2009  . DEGENERATIVE JOINT DISEASE 03/23/2008  . Hyperlipidemia 11/18/2007  . INSOMNIA 10/17/2007  . Asthma 11/30/2006  . Essential hypertension 09/05/2006  . ARTHRITIS 09/05/2006  . VERTIGO 09/05/2006    [name], OTR/L  [phone]  09/30/2015, 3:31 PM  [place] [address] [place], [place], [address] Phone: [phone]   Fax:  [phone]  Name: Teresa Soto MRN: [id number] Date of Birth: August 06, 1956

## 2015-10-01 ENCOUNTER — Ambulatory Visit (HOSPITAL_COMMUNITY): Payer: BLUE CROSS/BLUE SHIELD | Admitting: Occupational Therapy

## 2015-10-12 ENCOUNTER — Ambulatory Visit (HOSPITAL_COMMUNITY): Payer: BLUE CROSS/BLUE SHIELD | Attending: Family Medicine | Admitting: Occupational Therapy

## 2015-10-12 ENCOUNTER — Encounter (HOSPITAL_COMMUNITY): Payer: Self-pay | Admitting: Occupational Therapy

## 2015-10-12 DIAGNOSIS — R278 Other lack of coordination: Secondary | ICD-10-CM

## 2015-10-12 DIAGNOSIS — R279 Unspecified lack of coordination: Secondary | ICD-10-CM | POA: Insufficient documentation

## 2015-10-12 DIAGNOSIS — R200 Anesthesia of skin: Secondary | ICD-10-CM

## 2015-10-12 DIAGNOSIS — R202 Paresthesia of skin: Secondary | ICD-10-CM | POA: Diagnosis present

## 2015-10-12 NOTE — Therapy (Signed)
[place], [place], [address] Phone: [phone]   Fax:  [phone]  Occupational Therapy Treatment  Patient Details  Name: Teresa Soto MRN: [id number] Date of Birth: 08-Jan-1956 Referring Provider: Dr. Buelah Manis  Encounter Date: 10/12/2015      OT End of Session - 10/12/15 1622    Visit Number 6   Number of Visits 8   Date for OT Re-Evaluation 10/31/15   Authorization Type [employer]   Authorization Time Period 50 days per calendar year also recieving PT   Authorization - Visit Number 13   Authorization - Number of Visits 50   OT Start Time 1430   OT Stop Time 1517   OT Time Calculation (min) 47 min   Activity Tolerance Patient tolerated treatment well   Behavior During Therapy [facility] for tasks assessed/performed      Past Medical History  Diagnosis Date  . Vertigo   . Hypertension   . Asthma   . Neuropathic pain   . Restless legs syndrome 2007 approx  . Depression   . Chronic back pain   . Stroke ([place])   . Spinal headache   . 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 Meniere disease     Past Surgical History  Procedure Laterality Date  . Back surgery    . Tonsillectomy    . Cesarean section      x 2  . Shoulder arthroscopy w/ rotator cuff repair      to right shoulder  . Tubal ligation    . Spine surgery  12/2010    ruptd L1 L2 , Dr Carloyn Manner  . Total knee arthroplasty Right 08/12/2014    Procedure: RIGHT TOTAL KNEE ARTHROPLASTY;  Surgeon: Carole Civil, MD;  Location: [facility];  Service: Orthopedics;  Laterality: Right;    There were no vitals filed for this visit.  Visit Diagnosis:  Decreased coordination  Lack of coordination  Numbness and tingling in right hand      Subjective Assessment - 10/12/15 1426    Subjective  S: I just can't feel with the tips of my fingers.    Currently in Pain? Yes   Pain Score 4    Pain Location Hand   Pain Orientation Right   Pain Descriptors / Indicators  Aching   Pain Type Acute pain            [facility] OT Assessment - 10/12/15 1424    Assessment   Diagnosis Right Fine Motor Coordination Deficit   Precautions   Precautions None                  OT Treatments/Exercises (OP) - 10/12/15 1434    Exercises   Exercises Shoulder;Wrist;Hand   Fine Motor Coordination   Large Pegboard Perfection game:  placed pegs in with min difficulty, when timed able to place all pieces in 39.43 seconds.     Hand Exercises   Other Hand Exercises fine motor functional board: small hook clasps and cable outlet moderately challenging   Fine Motor Coordination   Fine Motor Coordination Manipulating coins;Picking up coins;In hand manipuation training   In Hand Manipulation Training Pt completed card tasks, focusing on hand to table and hand to hand translation holding cards with left hand, manipulating with right hand. Pt with min difficulty hand to table and mod difficulty hand to hand.    Picking up coins Pt picked up pennies off table, able to pick up 4  coins at one time.    Manipulating coins Pt able to translate coins from palm to fingers and place in slotted container with min difficulty   Manual Therapy   Manual Therapy Myofascial release   Manual therapy comments manual therapy completed prior to all other interventions this date.   Myofascial Release MFR and manual stretching to right flexor and extensor forearm, wrist, and hand to decrease fascial restrictions                   OT Short Term Goals - 10/12/15 1621    OT SHORT TERM GOAL #1   Title Patient will be educated on a HEP for coordination training and sensory reintegration.   Time 4   Period Weeks   Status Achieved   OT SHORT TERM GOAL #2   Title Patient will improve right 3 point pinch strength by 2# for improved abilty to pick up cups.   Time 4   Period Weeks   Status Achieved   OT SHORT TERM GOAL #3   Title Patient will improve right hand fine motor coordination  by decreasing completion time on nine hole peg test by 4" or more for increased ability to pick up change at work.    Time 4   Period Weeks   Status Not Met   OT SHORT TERM GOAL #4   Title Patient will improve sensation in right hand from diminished light touch to normal for increased ability to pick up change at work.    Time 4   Period Weeks   Status Not Met   OT SHORT TERM GOAL #5   Title Patient will increase webspace to 2" in right hand for greater dexterity in right hand needed for coordination activities.   Time 4   Period Weeks   Status Not Met   OT SHORT TERM GOAL #6   Title Patient will decrease fascial restrictions in right hand to trace for increased mobility in hand needed for fine motor tasks.    Time 4   Period Weeks   Status Achieved   OT SHORT TERM GOAL #7   Title Patient will have 2/10 pain or less in her right hand for greater ease when completing daily activities.    Time 4   Period Weeks   Status Not Met                  Plan - 10/12/15 1622    Clinical Impression Statement A: Pt completed fine motor tasks focusing on fine motor coordination with right hand. Pt educated on sensory re-integration HEP and discussed effects of limited sensation on right hand coordination. Pt is agreeable to discharge today with HEP, having met 3/7 STGs during course of therapy.    Plan P: Discharge pt.         Problem List Patient Active Problem List   Diagnosis Date Noted  . Postoperative pulmonary edema ([place]) 08/15/2014  . 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 Primary osteoarthritis of right knee 07/27/2014  . OA (osteoarthritis) of knee 06/16/2014  . Idiopathic parathyroidism ([place]) 06/16/2014  . Tinea versicolor 06/16/2014  . Obesity, unspecified 04/06/2013  . Lack of coordination 10/21/2012  . CVA (cerebral infarction) 10/10/2012  . Vitamin D deficiency 11/10/2011  . Hearing loss 06/29/2011  . Carotid bruit 06/29/2011  . BACK PAIN, LUMBAR, WITH  RADICULOPATHY 06/28/2010  . SCIATICA, LEFT 05/11/2010  . Major depression ([place]) 04/04/2010  . PERIPHERAL EDEMA 04/08/2009  . DEGENERATIVE  JOINT DISEASE 03/23/2008  . Hyperlipidemia 11/18/2007  . INSOMNIA 10/17/2007  . Asthma 11/30/2006  . Essential hypertension 09/05/2006  . ARTHRITIS 09/05/2006  . VERTIGO 09/05/2006    [name], OTR/L  [phone]  10/12/2015, 4:25 PM  [place], [place], [address] Phone: [phone]   Fax:  [phone]  Name: Teresa Soto MRN: [id number] Date of Birth: 1956-05-05  OCCUPATIONAL THERAPY DISCHARGE SUMMARY  Visits from Start of Care: 6  Current functional level related to goals / functional outcomes: See Goals Above. Pt completes all fine motor tasks during therapy sessions with min to no difficulty.  Pt has also improved grip and pinch strength in RUE.    Remaining deficits: Pt reports she has difficulty with tasks involving feeling with her fingers such as putting earrings in and completing sewing tasks.    Education / Equipment: Pt provided with sensory re-integration strategies and activities to complete as HEP. Also educated on compensatory strategies for limited sensation in right fingertips.   Plan: Patient agrees to discharge.  Patient goals were partially met. Patient is being discharged due to lack of progress.  ?????

## 2015-10-14 ENCOUNTER — Ambulatory Visit (HOSPITAL_COMMUNITY): Payer: BLUE CROSS/BLUE SHIELD | Admitting: Occupational Therapy

## 2015-10-19 ENCOUNTER — Ambulatory Visit (HOSPITAL_COMMUNITY): Payer: BLUE CROSS/BLUE SHIELD

## 2015-10-21 ENCOUNTER — Encounter (HOSPITAL_COMMUNITY): Payer: BLUE CROSS/BLUE SHIELD

## 2015-11-30 ENCOUNTER — Ambulatory Visit: Payer: BLUE CROSS/BLUE SHIELD | Admitting: Family Medicine

## 2015-12-03 ENCOUNTER — Telehealth: Payer: Self-pay | Admitting: Family Medicine

## 2015-12-03 ENCOUNTER — Ambulatory Visit (INDEPENDENT_AMBULATORY_CARE_PROVIDER_SITE_OTHER): Payer: BLUE CROSS/BLUE SHIELD | Admitting: Family Medicine

## 2015-12-03 ENCOUNTER — Encounter: Payer: Self-pay | Admitting: Family Medicine

## 2015-12-03 VITALS — BP 140/80 | HR 72 | Temp 98.9°F | Resp 18 | Ht 61.0 in | Wt 171.0 lb

## 2015-12-03 DIAGNOSIS — F3340 Major depressive disorder, recurrent, in remission, unspecified: Secondary | ICD-10-CM | POA: Diagnosis not present

## 2015-12-03 DIAGNOSIS — M6530 Trigger finger, unspecified finger: Secondary | ICD-10-CM

## 2015-12-03 DIAGNOSIS — E559 Vitamin D deficiency, unspecified: Secondary | ICD-10-CM | POA: Diagnosis not present

## 2015-12-03 DIAGNOSIS — E785 Hyperlipidemia, unspecified: Secondary | ICD-10-CM

## 2015-12-03 DIAGNOSIS — E209 Hypoparathyroidism, unspecified: Secondary | ICD-10-CM

## 2015-12-03 LAB — COMPREHENSIVE METABOLIC PANEL
ALK PHOS: 74 U/L (ref 33–130)
ALT: 17 U/L (ref 6–29)
AST: 18 U/L (ref 10–35)
Albumin: 4 g/dL (ref 3.6–5.1)
BUN: 15 mg/dL (ref 7–25)
CALCIUM: 10.7 mg/dL — AB (ref 8.6–10.4)
CO2: 20 mmol/L (ref 20–31)
Chloride: 106 mmol/L (ref 98–110)
Creat: 0.96 mg/dL (ref 0.50–1.05)
Glucose, Bld: 124 mg/dL — ABNORMAL HIGH (ref 70–99)
POTASSIUM: 4.2 mmol/L (ref 3.5–5.3)
Sodium: 137 mmol/L (ref 135–146)
TOTAL PROTEIN: 6.6 g/dL (ref 6.1–8.1)
Total Bilirubin: 0.5 mg/dL (ref 0.2–1.2)

## 2015-12-03 LAB — CBC WITH DIFFERENTIAL/PLATELET
BASOS ABS: 0 10*3/uL (ref 0.0–0.1)
Basophils Relative: 0 % (ref 0–1)
EOS PCT: 3 % (ref 0–5)
Eosinophils Absolute: 0.2 10*3/uL (ref 0.0–0.7)
HCT: 40.5 % (ref 36.0–46.0)
Hemoglobin: 13.2 g/dL (ref 12.0–15.0)
LYMPHS ABS: 1.7 10*3/uL (ref 0.7–4.0)
LYMPHS PCT: 32 % (ref 12–46)
MCH: 28.4 pg (ref 26.0–34.0)
MCHC: 32.6 g/dL (ref 30.0–36.0)
MCV: 87.3 fL (ref 78.0–100.0)
MPV: 9.4 fL (ref 8.6–12.4)
Monocytes Absolute: 0.3 10*3/uL (ref 0.1–1.0)
Monocytes Relative: 6 % (ref 3–12)
NEUTROS PCT: 59 % (ref 43–77)
Neutro Abs: 3.1 10*3/uL (ref 1.7–7.7)
Platelets: 319 10*3/uL (ref 150–400)
RBC: 4.64 MIL/uL (ref 3.87–5.11)
RDW: 14.5 % (ref 11.5–15.5)
WBC: 5.3 10*3/uL (ref 4.0–10.5)

## 2015-12-03 LAB — LIPID PANEL
CHOLESTEROL: 235 mg/dL — AB (ref 125–200)
HDL: 41 mg/dL — AB (ref >=46)
LDL Cholesterol: 162 mg/dL — ABNORMAL HIGH (ref <130)
TRIGLYCERIDES: 159 mg/dL — AB (ref <150)
Total CHOL/HDL Ratio: 5.7 Ratio — ABNORMAL HIGH (ref <=5.0)
VLDL: 32 mg/dL — AB (ref <30)

## 2015-12-03 MED ORDER — FENOFIBRATE 48 MG PO TABS: 48.0000 mg | ORAL_TABLET | Freq: Every day | ORAL | Status: DC

## 2015-12-03 NOTE — Patient Instructions (Addendum)
F/U 6 months for Physical Endocrinology referral Bone Density to be set up Try the Tricor for cholesterol  Referral to orthopedics for trigger  ]

## 2015-12-03 NOTE — Progress Notes (Signed)
Patient ID: Teresa Soto, female   DOB: 11/22/1955, 60 y.o.   MRN: [id number]    Subjective:    Patient ID: Teresa Soto, female    DOB: Jun 29, 1956, 60 y.o.   MRN: [id number]  Patient presents for F/U  patient here for follow-up. Her last visit she was having some problems with her balance she is history of stroke. She was sent to physical therapy and has completed her program. She has hyperlipidemia but does not tolerate the statin drugs. I also recommended TriCor but we do not receive any response from her after her last set of labs.   Labs also showed concern for worsening hyperparathyroid with hypercalcemia. We tried multiple times to contact parents and her letters she needs to see endocrinology for this.  She is taking her medications as prescribed for her blood pressure   History of major depression she's been maintained on Lexapro and doing well with this.   Asthma she has not had an acute exacerbation she continues on her Singulair and her albuterol as needed occasionally she feels tight and she is out in cold air  She also complains of difficulty straightening her ring finger on her right hand this been going on for the past couple of months and getting worse.  Review Of Systems:  GEN- denies fatigue, fever, weight loss,weakness, recent illness HEENT- denies eye drainage, change in vision, nasal discharge, CVS- denies chest pain, palpitations RESP- denies SOB, cough, wheeze ABD- denies N/V, change in stools, abd pain GU- denies dysuria, hematuria, dribbling, incontinence MSK- + joint pain, muscle aches, injury Neuro- denies headache, dizziness, syncope, seizure activity       Objective:    BP 140/80 mmHg  Pulse 72  Temp(Src) 98.9 F (37.2 C) (Oral)  Resp 18  Ht 5\' 1"  (1.549 m)  Wt 171 lb (77.565 kg)  BMI 32.33 kg/m2 GEN- NAD, alert and oriented x3 HEENT- PERRL, EOMI, non injected sclera, pink conjunctiva, MMM, oropharynx clear Neck- Supple, no  thyromegaly CVS- RRR, no murmur RESP-CTAB MSK- Right hand- trigger of 4th digit, small nodules near DIP 2nd and 3rd digits bilat hands,  EXT- No edema Pulses- Radial, DP- 2+        Assessment & Plan:      Problem List Items Addressed This Visit    Vitamin D deficiency   Relevant Orders   DG Bone Density   Major depression ([place])    Doing well on lexapro      Idiopathic parathyroidism ([place])    Start tricor 48mg  , low fat diet  Recheck levels, bone density to be done Refer to endocrinology      Relevant Orders   CBC with Differential/Platelet   Comprehensive metabolic panel   PTH, Intact and Calcium   VITAMIN D 25 Hydroxy (Vit-D Deficiency, Fractures)   Ambulatory referral to Endocrinology   DG Bone Density   Hyperlipidemia - Primary   Relevant Medications   fenofibrate (TRICOR) 48 MG tablet   Other Relevant Orders   Lipid panel    Other Visit Diagnoses    Trigger finger, acquired        Referral to orthopedics for treatment    Relevant Orders    Ambulatory referral to Orthopedic Surgery       Note: This dictation was prepared with Dragon dictation along with smaller phrase technology. Any transcriptional errors that result from this process are unintentional.

## 2015-12-03 NOTE — Assessment & Plan Note (Signed)
Doing well on lexapro 

## 2015-12-03 NOTE — Assessment & Plan Note (Signed)
>>  ASSESSMENT AND PLAN FOR MAJOR DEPRESSION WRITTEN ON 12/03/2015  9:30 AM BY BARI REA F  Doing well on lexapro

## 2015-12-03 NOTE — Telephone Encounter (Signed)
Noted referral placed to Dr. Jolee Ewing in referral wq

## 2015-12-03 NOTE — Telephone Encounter (Signed)
Referral is in place for ortho for this patient she would like to see dr Arther Abbott

## 2015-12-03 NOTE — Assessment & Plan Note (Addendum)
Start tricor 48mg  , low fat diet  Recheck levels, bone density to be done Refer to endocrinology

## 2015-12-04 LAB — VITAMIN D 25 HYDROXY (VIT D DEFICIENCY, FRACTURES): Vit D, 25-Hydroxy: 15 ng/mL — ABNORMAL LOW (ref 30–100)

## 2015-12-06 ENCOUNTER — Other Ambulatory Visit (HOSPITAL_COMMUNITY): Payer: BLUE CROSS/BLUE SHIELD

## 2015-12-06 LAB — PTH, INTACT AND CALCIUM
CALCIUM: 10.7 mg/dL — AB (ref 8.4–10.5)
PTH: 196 pg/mL — AB (ref 14–64)

## 2015-12-08 ENCOUNTER — Other Ambulatory Visit: Payer: Self-pay | Admitting: *Deleted

## 2015-12-08 DIAGNOSIS — E559 Vitamin D deficiency, unspecified: Secondary | ICD-10-CM

## 2015-12-08 DIAGNOSIS — R739 Hyperglycemia, unspecified: Secondary | ICD-10-CM

## 2015-12-08 MED ORDER — VITAMIN D (ERGOCALCIFEROL) 1.25 MG (50000 UNIT) PO CAPS: 50000.0000 [IU] | ORAL_CAPSULE | ORAL | Status: DC

## 2015-12-09 ENCOUNTER — Other Ambulatory Visit: Payer: Self-pay

## 2015-12-10 ENCOUNTER — Other Ambulatory Visit (HOSPITAL_COMMUNITY): Payer: BLUE CROSS/BLUE SHIELD

## 2015-12-30 ENCOUNTER — Encounter: Payer: Self-pay | Admitting: Family Medicine

## 2016-01-05 ENCOUNTER — Encounter: Payer: Self-pay | Admitting: Orthopedic Surgery

## 2016-01-05 ENCOUNTER — Ambulatory Visit (INDEPENDENT_AMBULATORY_CARE_PROVIDER_SITE_OTHER): Payer: BLUE CROSS/BLUE SHIELD | Admitting: Orthopedic Surgery

## 2016-01-05 VITALS — BP 169/96 | Ht 61.0 in | Wt 171.0 lb

## 2016-01-05 DIAGNOSIS — M65341 Trigger finger, right ring finger: Secondary | ICD-10-CM

## 2016-01-05 NOTE — Patient Instructions (Signed)
Trigger Finger °Trigger finger (digital tendinitis and stenosing tenosynovitis) is a common disorder that causes an often painful catching of the fingers or thumb. It occurs as a clicking, snapping, or locking of a finger in the palm of the hand. This is caused by a problem with the tendons that flex or bend the fingers sliding smoothly through their sheaths. The condition may occur in any finger or a couple fingers at the same time.  °The finger may lock with the finger curled or suddenly straighten out with a snap. This is more common in patients with rheumatoid arthritis and diabetes. Left untreated, the condition may get worse to the point where the finger becomes locked in flexion, like making a fist, or less commonly locked with the finger straightened out. °CAUSES  °· Inflammation and scarring that lead to swelling around the tendon sheath. °· Repeated or forceful movements. °· Rheumatoid arthritis, an autoimmune disease that affects joints. °· Gout. °· Diabetes mellitus. °SIGNS AND SYMPTOMS °· Soreness and swelling of your finger. °· A painful clicking or snapping as you bend and straighten your finger. °DIAGNOSIS  °Your health care provider will do a physical exam of your finger to diagnose trigger finger. °TREATMENT  °· Splinting for 6-8 weeks may be helpful. °· Nonsteroidal anti-inflammatory medicines (NSAIDs) can help to relieve the pain and inflammation. °· Cortisone injections, along with splinting, may speed up recovery. Several injections may be required. Cortisone may give relief after one injection. °· Surgery is another treatment that may be used if conservative treatments do not work. Surgery can be minor, without incisions (a cut does not have to be made), and can be done with a needle through the skin. °· Other surgical choices involve an open procedure in which the surgeon opens the hand through a small incision and cuts the pulley so the tendon can again slide smoothly. Your hand will still  work fine. °HOME CARE INSTRUCTIONS °· Apply ice to the injured area, twice per day: °¨ Put ice in a plastic bag. °¨ Place a towel between your skin and the bag. °¨ Leave the ice on for 20 minutes, 3-4 times a day. °· Rest your hand often. °MAKE SURE YOU:  °· Understand these instructions. °· Will watch your condition. °· Will get help right away if you are not doing well or get worse. °  °This information is not intended to replace advice given to you by your health care provider. Make sure you discuss any questions you have with your health care provider. °  °Document Released: 07/15/2004 Document Revised: 05/28/2013 Document Reviewed: 02/25/2013 °Elsevier Interactive Patient Education ©2016 [employer] ° °

## 2016-01-05 NOTE — Progress Notes (Signed)
Chief Complaint  Patient presents with  . Hand Pain    right ring finger trigger finger   HPI 60 years old. Right ring finger catching locking associated with moderate aching pain over the A1 pulley with catching. Patient has to unlock the finger at random times as well. She is a right-hand dominant cashier  Review of Systems  Constitutional: Positive for fever, chills and malaise/fatigue.  HENT: Positive for hearing loss and tinnitus.   Cardiovascular: Positive for leg swelling.  Gastrointestinal: Positive for constipation.  Musculoskeletal: Positive for back pain and joint pain.  Neurological: Positive for dizziness.  Endo/Heme/Allergies: Positive for environmental allergies.  Psychiatric/Behavioral: Positive for depression.  All other systems reviewed and are negative.   Past Medical History  Diagnosis Date  . Vertigo   . Hypertension   . Asthma   . Neuropathic pain   . Restless legs syndrome 2007 approx  . Depression   . Chronic back pain   . Stroke ([place])   . Spinal headache   . 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 Meniere disease     Past Surgical History  Procedure Laterality Date  . Back surgery    . Tonsillectomy    . Cesarean section      x 2  . Shoulder arthroscopy w/ rotator cuff repair      to right shoulder  . Tubal ligation    . Spine surgery  12/2010    ruptd L1 L2 , Dr Carloyn Manner  . Total knee arthroplasty Right 08/12/2014    Procedure: RIGHT TOTAL KNEE ARTHROPLASTY;  Surgeon: Carole Civil, MD;  Location: [facility];  Service: Orthopedics;  Laterality: Right;   No family history on file. Social History  Substance Use Topics  . Smoking status: Former Smoker -- 2.00 packs/day for 2 years    Quit date: 08/06/1984  . Smokeless tobacco: Never Used     Comment: quit in 1984  . Alcohol Use: Yes     Comment: occasionally    Current outpatient prescriptions:  .  Albuterol Sulfate (PROAIR RESPICLICK) 123XX123 (90 BASE) MCG/ACT AEPB, Inhale 2 puffs into the  lungs every 4 (four) hours as needed., Disp: 1 each, Rfl: 1 .  escitalopram (LEXAPRO) 20 MG tablet, TAKE ONE TABLET BY MOUTH ONCE DAILY, Disp: 30 tablet, Rfl: 3 .  fenofibrate (TRICOR) 48 MG tablet, Take 1 tablet (48 mg total) by mouth daily., Disp: 30 tablet, Rfl: 3 .  gabapentin (NEURONTIN) 300 MG capsule, Take 300 mg by mouth at bedtime., Disp: , Rfl:  .  lisinopril (PRINIVIL,ZESTRIL) 10 MG tablet, TAKE ONE TABLET BY MOUTH ONCE DAILY, Disp: 30 tablet, Rfl: 3 .  meclizine (ANTIVERT) 50 MG tablet, Take 1 tablet (50 mg total) by mouth 2 (two) times daily as needed for dizziness., Disp: 30 tablet, Rfl: 0 .  Melatonin 10 MG TABS, Take 1 tablet by mouth at bedtime., Disp: , Rfl:  .  mometasone (NASONEX) 50 MCG/ACT nasal spray, Place 2 sprays into the nose daily. Reported on 12/03/2015, Disp: , Rfl:  .  montelukast (SINGULAIR) 10 MG tablet, TAKE ONE TABLET BY MOUTH ONCE DAILY, Disp: 30 tablet, Rfl: 3 .  Multiple Vitamins-Minerals (HAIR SKIN AND NAILS FORMULA PO), Take by mouth., Disp: , Rfl:  .  naproxen (NAPROSYN) 500 MG tablet, Take 1 tablet (500 mg total) by mouth 2 (two) times daily with a meal., Disp: 60 tablet, Rfl: 5 .  ondansetron (ZOFRAN) 4 MG tablet, Take 1 tablet (4 mg total) by mouth  every 6 (six) hours., Disp: 20 tablet, Rfl: 1 .  polyethylene glycol (MIRALAX / GLYCOLAX) packet, Take 17 g by mouth daily., Disp: 14 each, Rfl: 0 .  pramipexole (MIRAPEX) 0.25 MG tablet, TAKE ONE TABLET BY MOUTH AT BEDTIME, Disp: 30 tablet, Rfl: 3 .  Vitamin D, Ergocalciferol, (DRISDOL) 50000 units CAPS capsule, Take 1 capsule (50,000 Units total) by mouth every 7 (seven) days., Disp: 12 capsule, Rfl: 0 .  [DISCONTINUED] furosemide (LASIX) 40 MG tablet, Take 0.5 tablets (20 mg total) by mouth daily. (Patient not taking: Reported on 09/09/2014), Disp: 5 tablet, Rfl: 0  BP 169/96 mmHg  Ht 5\' 1"  (1.549 m)  Wt 171 lb (77.565 kg)  BMI 32.33 kg/m2  Physical Exam  Constitutional: She is oriented to person, place,  and time. She appears well-developed and well-nourished. No distress.  Cardiovascular: Normal rate and intact distal pulses.   Musculoskeletal:  Examination the right hand. She has tenderness over the A1 pulley no deformities are noted. She has full range of motion with catching. The joint is otherwise stable flexor tendon strength is normal skin is intact no rashes no lesions or ulcerations. Capillary refill is normal radial pulses normal and there is normal sensation in the hand. We note a previous carpal tunnel incision which is nontender   Lymphadenopathy:       Left: No epitrochlear adenopathy present.  Neurological: She is alert and oriented to person, place, and time. She has normal reflexes. She exhibits normal muscle tone. Coordination normal.  Skin: Skin is warm and dry. No rash noted. She is not diaphoretic. No erythema. No pallor.  Psychiatric: She has a normal mood and affect. Her behavior is normal. Judgment and thought content normal.   left hand exhibits no tenderness in the A1 pulley area no catching no locking normal color and perfusion of the hand  Ortho Exam   ASSESSMENT: Encounter Diagnosis  Name Primary?  . Trigger ring finger of right hand Yes      PLAN Trigger finger injection  Diagnosis  Right ring finger Procedure injection A1 pulley Medications lidocaine 1% 1 mL and Depo-Medrol 40 mg 1 mL Skin prep alcohol and ethyl chloride Verbal consent was obtained Timeout confirmed the injection site  After cleaning the skin with alcohol and anesthetizing the skin with ethyl chloride the A1 pulley was palpated and the injection was performed without complication

## 2016-01-20 ENCOUNTER — Ambulatory Visit (INDEPENDENT_AMBULATORY_CARE_PROVIDER_SITE_OTHER): Payer: BLUE CROSS/BLUE SHIELD | Admitting: "Endocrinology

## 2016-01-20 ENCOUNTER — Encounter: Payer: Self-pay | Admitting: "Endocrinology

## 2016-01-20 VITALS — BP 137/88 | HR 72 | Ht 61.0 in | Wt 169.0 lb

## 2016-01-20 DIAGNOSIS — E210 Primary hyperparathyroidism: Secondary | ICD-10-CM | POA: Diagnosis not present

## 2016-01-20 NOTE — Progress Notes (Signed)
Subjective:    Patient ID: Teresa Soto, female    DOB: 15-Jan-1956, PCP Vic Blackbird, MD   Past Medical History  Diagnosis Date  . Vertigo   . Hypertension   . Asthma   . Neuropathic pain   . Restless legs syndrome 2007 approx  . Depression   . Chronic back pain   . Stroke ([place])   . Spinal headache   . 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 Meniere disease    Past Surgical History  Procedure Laterality Date  . Back surgery    . Tonsillectomy    . Cesarean section      x 2  . Shoulder arthroscopy w/ rotator cuff repair      to right shoulder  . Tubal ligation    . Spine surgery  12/2010    ruptd L1 L2 , Dr Carloyn Manner  . Total knee arthroplasty Right 08/12/2014    Procedure: RIGHT TOTAL KNEE ARTHROPLASTY;  Surgeon: Carole Civil, MD;  Location: [facility];  Service: Orthopedics;  Laterality: Right;   Social History   Social History  . Marital Status: Married    Spouse Name: N/A  . Number of Children: N/A  . Years of Education: N/A   Social History Main Topics  . Smoking status: Former Smoker -- 2.00 packs/day for 2 years    Quit date: 08/06/1984  . Smokeless tobacco: Never Used     Comment: quit in 1984  . Alcohol Use: Yes     Comment: occasionally  . Drug Use: No  . Sexual Activity: Yes    Birth Control/ Protection: Surgical   Other Topics Concern  . None   Social History Narrative   Outpatient Encounter Prescriptions as of 01/20/2016  Medication Sig  . Albuterol Sulfate (PROAIR RESPICLICK) 123XX123 (90 BASE) MCG/ACT AEPB Inhale 2 puffs into the lungs every 4 (four) hours as needed.  Marland Kitchen escitalopram (LEXAPRO) 20 MG tablet TAKE ONE TABLET BY MOUTH ONCE DAILY  . fenofibrate (TRICOR) 48 MG tablet Take 1 tablet (48 mg total) by mouth daily.  Marland Kitchen gabapentin (NEURONTIN) 300 MG capsule Take 300 mg by mouth at bedtime.  Marland Kitchen lisinopril (PRINIVIL,ZESTRIL) 10 MG tablet TAKE ONE TABLET BY MOUTH ONCE DAILY  . meclizine (ANTIVERT) 50 MG tablet Take 1 tablet (50 mg total) by  mouth 2 (two) times daily as needed for dizziness.  . Melatonin 10 MG TABS Take 1 tablet by mouth at bedtime.  . mometasone (NASONEX) 50 MCG/ACT nasal spray Place 2 sprays into the nose daily. Reported on 12/03/2015  . montelukast (SINGULAIR) 10 MG tablet TAKE ONE TABLET BY MOUTH ONCE DAILY  . Multiple Vitamins-Minerals (HAIR SKIN AND NAILS FORMULA PO) Take by mouth.  . naproxen (NAPROSYN) 500 MG tablet Take 1 tablet (500 mg total) by mouth 2 (two) times daily with a meal.  . ondansetron (ZOFRAN) 4 MG tablet Take 1 tablet (4 mg total) by mouth every 6 (six) hours.  . polyethylene glycol (MIRALAX / GLYCOLAX) packet Take 17 g by mouth daily.  . pramipexole (MIRAPEX) 0.25 MG tablet TAKE ONE TABLET BY MOUTH AT BEDTIME  . Vitamin D, Ergocalciferol, (DRISDOL) 50000 units CAPS capsule Take 1 capsule (50,000 Units total) by mouth every 7 (seven) days.   No facility-administered encounter medications on file as of 01/20/2016.   ALLERGIES: Allergies  Allergen Reactions  . Other Nausea And Vomiting    Peppers  . Influenza Vaccines Rash   VACCINATION STATUS: Immunization History  Administered  Date(s) Administered  . Influenza Whole 06/28/2010  . Td 03/30/2010  . Tdap 01/02/2013    HPI 60 year old female patient with medical history as above. She is being seen in consultation for hyperparathyroidism requested by Dr. Buelah Manis. -She was observed to have hypercalcemia associated with elevated PTH for at least 2 years. PTH ranged between 196-209 along with calcium from 10.7-11.8. -She denies history of nephrolithiasis, seizure disorder, did not have bone density lately . -She denies family history of parathyroid, pituitary, pancreatic dysfunction. -She is not taking over-the-counter calcium supplements. -She was found to have vitamin D deficiency, on replacement on and off for the last 16 months. Her last lab work showed vitamin D still low at 15. -She did not have 24 hour urine calcium  determination. -Her intake of dairy products is that of average.   Review of Systems Constitutional: Reports steady body weight, no fatigue, no subjective hyperthermia/hypothermia Eyes: no blurry vision, no xerophthalmia ENT: no sore throat, no nodules palpated in throat, no dysphagia/odynophagia, no hoarseness Cardiovascular: no CP/SOB/palpitations/leg swelling Respiratory: no cough/SOB Gastrointestinal: no N/V/D/C Musculoskeletal: no muscle/joint aches Skin: no rashes Neurological: no tremors/numbness/tingling/dizziness Psychiatric: no depression/anxiety  Objective:    BP 137/88 mmHg  Pulse 72  Ht 5\' 1"  (1.549 m)  Wt 169 lb (76.658 kg)  BMI 31.95 kg/m2  SpO2 98%  Wt Readings from Last 3 Encounters:  01/20/16 169 lb (76.658 kg)  01/05/16 171 lb (77.565 kg)  12/03/15 171 lb (77.565 kg)    Physical Exam Constitutional: overweight, in NAD Eyes: PERRLA, EOMI, no exophthalmos ENT: moist mucous membranes, no thyromegaly, no cervical lymphadenopathy Cardiovascular: RRR, No MRG Respiratory: CTA B Gastrointestinal: abdomen soft, NT, ND, BS+ Musculoskeletal: no deformities, strength intact in all 4 Skin: moist, warm, no rashes Neurological: no tremor with outstretched hands, DTR normal in all 4   CMP     Component Value Date/Time   NA 137 12/03/2015 0908   K 4.2 12/03/2015 0908   CL 106 12/03/2015 0908   CO2 20 12/03/2015 0908   GLUCOSE 124* 12/03/2015 0908   BUN 15 12/03/2015 0908   CREATININE 0.96 12/03/2015 0908   CREATININE 0.80 10/07/2014 0926   CALCIUM 10.7* 12/03/2015 0908   CALCIUM 10.7* 12/03/2015 0908   PROT 6.6 12/03/2015 0908   ALBUMIN 4.0 12/03/2015 0908   AST 18 12/03/2015 0908   ALT 17 12/03/2015 0908   ALKPHOS 74 12/03/2015 0908   BILITOT 0.5 12/03/2015 0908   GFRNONAA 75* 08/17/2014 1942   GFRAA 86* 08/17/2014 1942    Diabetic Labs (most recent): Lab Results  Component Value Date   HGBA1C 6.0* 09/28/2012   HGBA1C 6.1* 11/07/2011     Lipid  Panel ( most recent) Lipid Panel     Component Value Date/Time   CHOL 235* 12/03/2015 0908   TRIG 159* 12/03/2015 0908   HDL 41* 12/03/2015 0908   CHOLHDL 5.7* 12/03/2015 0908   VLDL 32* 12/03/2015 0908   LDLCALC 162* 12/03/2015 0908    - PTH level in her records revised: 12/03/2015 he was 196 11/26/2013 he was 209.   - Calcium level ranged from 11.8 from October 2016 - 10.7 from 12/03/2015   Assessment & Plan:   1. Hyperparathyroidism, primary ([place]) - I have reviewed her available records and evaluated patient clinically. -Even though she did have chronic hypovitaminosis D which may trigger secondary hyperparathyroidism, in her case significantly elevated PTH into 200s associated with high calcium of 11.8 points towards the possibility of primary hyperparathyroidism. -I discussed  the need to confirm the diagnosis with  additional tests including 24 hour urine calcium determination, to avoid long-term complications of untreated hyperparathyroidism including high risk for nephrolithiasis, seizure disorder, osteoporosis, and GI disturbances. -She agrees with plan and I will proceed to obtain 24-hour urine sample for calcium excretion and screening bone density. -If hyperparathyroidism is confirmed, she is a surgical candidate for parathyroidectomy. -She will continue to take vitamin D until she is vitamin D replete. -It is unlikely that she has FH H (familial  hypocalciuric  Hypercalcemia), which would not need active intervention.   - I advised patient to maintain close follow up with Vic Blackbird, MD for primary care needs. Follow up plan: Return in about 2 weeks (around 02/03/2016) for follow up with pre-visit labs, Bone Density, Hyperparathyroidism.  Glade Lloyd, MD Phone: [phone]  Fax: [phone]   01/20/2016, 3:47 PM

## 2016-01-22 ENCOUNTER — Other Ambulatory Visit: Payer: Self-pay | Admitting: "Endocrinology

## 2016-01-22 DIAGNOSIS — E210 Primary hyperparathyroidism: Secondary | ICD-10-CM | POA: Diagnosis not present

## 2016-01-23 LAB — CALCIUM, URINE, 24 HOUR
Calcium, 24 hour urine: 255 mg/24 h — ABNORMAL HIGH (ref 35–250)
Calcium, Ur: 15 mg/dL

## 2016-01-28 ENCOUNTER — Other Ambulatory Visit (HOSPITAL_COMMUNITY): Payer: BLUE CROSS/BLUE SHIELD

## 2016-02-04 ENCOUNTER — Telehealth: Payer: Self-pay

## 2016-02-04 ENCOUNTER — Other Ambulatory Visit: Payer: Self-pay | Admitting: Family Medicine

## 2016-02-04 NOTE — Telephone Encounter (Signed)
Left pt message notifying her of the cancellation of her appt with DrNida. She has rescheduled her appt for the bone density after the original appt w Dr Dorris Fetch. These test results were needed for her appt. Left voice mail for her to call back and reschedule with [place].

## 2016-02-04 NOTE — Telephone Encounter (Signed)
Refill appropriate and filled per protocol. 

## 2016-02-07 ENCOUNTER — Ambulatory Visit: Payer: BLUE CROSS/BLUE SHIELD | Admitting: "Endocrinology

## 2016-02-09 ENCOUNTER — Other Ambulatory Visit (HOSPITAL_COMMUNITY): Payer: BLUE CROSS/BLUE SHIELD

## 2016-03-15 ENCOUNTER — Other Ambulatory Visit: Payer: Self-pay | Admitting: Family Medicine

## 2016-03-17 ENCOUNTER — Ambulatory Visit (HOSPITAL_COMMUNITY)
Admission: RE | Admit: 2016-03-17 | Discharge: 2016-03-17 | Disposition: A | Discharge status: Home / Self Care | Payer: BLUE CROSS/BLUE SHIELD | Source: Ambulatory Visit | Attending: "Endocrinology | Admitting: "Endocrinology

## 2016-03-17 DIAGNOSIS — M810 Age-related osteoporosis without current pathological fracture: Secondary | ICD-10-CM | POA: Insufficient documentation

## 2016-03-17 DIAGNOSIS — E210 Primary hyperparathyroidism: Secondary | ICD-10-CM | POA: Diagnosis not present

## 2016-03-17 DIAGNOSIS — Z780 Asymptomatic menopausal state: Secondary | ICD-10-CM | POA: Diagnosis not present

## 2016-03-27 ENCOUNTER — Ambulatory Visit (INDEPENDENT_AMBULATORY_CARE_PROVIDER_SITE_OTHER): Payer: BLUE CROSS/BLUE SHIELD | Admitting: Otolaryngology

## 2016-03-27 DIAGNOSIS — H9041 Sensorineural hearing loss, unilateral, right ear, 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: Secondary | ICD-10-CM | POA: Diagnosis not present

## 2016-03-27 DIAGNOSIS — H8101 Meniere's disease, right ear: Secondary | ICD-10-CM

## 2016-03-28 ENCOUNTER — Encounter: Payer: Self-pay | Admitting: Family Medicine

## 2016-04-03 ENCOUNTER — Encounter: Payer: Self-pay | Admitting: "Endocrinology

## 2016-04-03 ENCOUNTER — Ambulatory Visit (INDEPENDENT_AMBULATORY_CARE_PROVIDER_SITE_OTHER): Payer: BLUE CROSS/BLUE SHIELD | Admitting: "Endocrinology

## 2016-04-03 VITALS — BP 156/90 | HR 81 | Ht 61.0 in | Wt 169.0 lb

## 2016-04-03 DIAGNOSIS — M810 Age-related osteoporosis without current pathological fracture: Secondary | ICD-10-CM | POA: Diagnosis not present

## 2016-04-03 DIAGNOSIS — E210 Primary hyperparathyroidism: Secondary | ICD-10-CM | POA: Diagnosis not present

## 2016-04-03 DIAGNOSIS — M8000XA Age-related osteoporosis with current pathological fracture, unspecified site, initial encounter for fracture: Secondary | ICD-10-CM | POA: Insufficient documentation

## 2016-04-03 DIAGNOSIS — E559 Vitamin D deficiency, unspecified: Secondary | ICD-10-CM

## 2016-04-03 DIAGNOSIS — I10 Essential (primary) hypertension: Secondary | ICD-10-CM | POA: Diagnosis not present

## 2016-04-03 MED ORDER — ALENDRONATE SODIUM 70 MG PO TABS: 70.0000 mg | ORAL_TABLET | ORAL | Status: DC

## 2016-04-03 NOTE — Progress Notes (Signed)
Subjective:    Patient ID: Teresa Soto, female    DOB: November 29, 1955, PCP Vic Blackbird, MD   Past Medical History  Diagnosis Date  . Vertigo   . Hypertension   . Asthma   . Neuropathic pain   . Restless legs syndrome 2007 approx  . Depression   . Chronic back pain   . Stroke ([place])   . Spinal headache   . 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 Meniere disease    Past Surgical History  Procedure Laterality Date  . Back surgery    . Tonsillectomy    . Cesarean section      x 2  . Shoulder arthroscopy w/ rotator cuff repair      to right shoulder  . Tubal ligation    . Spine surgery  12/2010    ruptd L1 L2 , Dr Carloyn Manner  . Total knee arthroplasty Right 08/12/2014    Procedure: RIGHT TOTAL KNEE ARTHROPLASTY;  Surgeon: Carole Civil, MD;  Location: [facility];  Service: Orthopedics;  Laterality: Right;   Social History   Social History  . Marital Status: Married    Spouse Name: N/A  . Number of Children: N/A  . Years of Education: N/A   Social History Main Topics  . Smoking status: Former Smoker -- 2.00 packs/day for 2 years    Quit date: 08/06/1984  . Smokeless tobacco: Never Used     Comment: quit in 1984  . Alcohol Use: Yes     Comment: occasionally  . Drug Use: No  . Sexual Activity: Yes    Birth Control/ Protection: Surgical   Other Topics Concern  . None   Social History Narrative   Outpatient Encounter Prescriptions as of 04/03/2016  Medication Sig  . Albuterol Sulfate (PROAIR RESPICLICK) 123XX123 (90 BASE) MCG/ACT AEPB Inhale 2 puffs into the lungs every 4 (four) hours as needed.  Marland Kitchen alendronate (FOSAMAX) 70 MG tablet Take 1 tablet (70 mg total) by mouth every 7 (seven) days. Take with a full glass of water on an empty stomach.  . escitalopram (LEXAPRO) 20 MG tablet TAKE ONE TABLET BY MOUTH ONCE DAILY  . fenofibrate (TRICOR) 48 MG tablet Take 1 tablet (48 mg total) by mouth daily.  Marland Kitchen gabapentin (NEURONTIN) 300 MG capsule Take 300 mg by mouth at bedtime.  Marland Kitchen  lisinopril (PRINIVIL,ZESTRIL) 10 MG tablet TAKE ONE TABLET BY MOUTH ONCE DAILY  . meclizine (ANTIVERT) 50 MG tablet Take 1 tablet (50 mg total) by mouth 2 (two) times daily as needed for dizziness.  . Melatonin 10 MG TABS Take 1 tablet by mouth at bedtime.  . mometasone (NASONEX) 50 MCG/ACT nasal spray Place 2 sprays into the nose daily. Reported on 12/03/2015  . montelukast (SINGULAIR) 10 MG tablet TAKE ONE TABLET BY MOUTH ONCE DAILY  . Multiple Vitamins-Minerals (HAIR SKIN AND NAILS FORMULA PO) Take by mouth.  . naproxen (NAPROSYN) 500 MG tablet TAKE ONE TABLET BY MOUTH TWICE DAILY WITH A MEAL  . ondansetron (ZOFRAN) 4 MG tablet Take 1 tablet (4 mg total) by mouth every 6 (six) hours.  . polyethylene glycol (MIRALAX / GLYCOLAX) packet Take 17 g by mouth daily.  . pramipexole (MIRAPEX) 0.25 MG tablet TAKE ONE TABLET BY MOUTH ONCE DAILY AT BEDTIME  . Vitamin D, Ergocalciferol, (DRISDOL) 50000 units CAPS capsule TAKE ONE CAPSULE BY MOUTH EVERY 7 DAYS   No facility-administered encounter medications on file as of 04/03/2016.   ALLERGIES: Allergies  Allergen Reactions  .  Other Nausea And Vomiting    Peppers  . Influenza Vaccines Rash   VACCINATION STATUS: Immunization History  Administered Date(s) Administered  . Influenza Whole 06/28/2010  . Td 03/30/2010  . Tdap 01/02/2013    HPI 60 year old female patient with medical history as above. Returns for follow-up of her hyperparathyroidism with required studies.   -She was observed to have hypercalcemia associated with elevated PTH for at least 2 years. PTH ranged between 196-209 along with calcium from 10.7-11.8. -Her subsequent studies show she has significant hypercalcemia of 255 mg per 24 hours and premature osteoporosis at age 33 -based on her bone density. -She denies history of nephrolithiasis, seizure disorder. -She denies family history of parathyroid, pituitary, pancreatic dysfunction. -She is not taking over-the-counter calcium  supplements. -She was found to have vitamin D deficiency, on replacement.  -Her intake of dairy products is that of average.   Review of Systems Constitutional: Reports steady body weight, no fatigue, no subjective hyperthermia/hypothermia Eyes: no blurry vision, no xerophthalmia ENT: no sore throat, no nodules palpated in throat, no dysphagia/odynophagia, no hoarseness Cardiovascular: no CP/SOB/palpitations/leg swelling Respiratory: no cough/SOB Gastrointestinal: no N/V/D/C Musculoskeletal: no muscle/joint aches Skin: no rashes Neurological: no tremors/numbness/tingling/dizziness Psychiatric: no depression/anxiety  Objective:    BP 156/90 mmHg  Pulse 81  Ht 5\' 1"  (1.549 m)  Wt 169 lb (76.658 kg)  BMI 31.95 kg/m2  Wt Readings from Last 3 Encounters:  04/03/16 169 lb (76.658 kg)  01/20/16 169 lb (76.658 kg)  01/05/16 171 lb (77.565 kg)    Physical Exam Constitutional: overweight, in NAD Eyes: PERRLA, EOMI, no exophthalmos ENT: moist mucous membranes, no thyromegaly, no cervical lymphadenopathy Cardiovascular: RRR, No MRG Respiratory: CTA B Gastrointestinal: abdomen soft, NT, ND, BS+ Musculoskeletal: no deformities, strength intact in all 4 Skin: moist, warm, no rashes Neurological: no tremor with outstretched hands, DTR normal in all 4   CMP     Component Value Date/Time   NA 137 12/03/2015 0908   K 4.2 12/03/2015 0908   CL 106 12/03/2015 0908   CO2 20 12/03/2015 0908   GLUCOSE 124* 12/03/2015 0908   BUN 15 12/03/2015 0908   CREATININE 0.96 12/03/2015 0908   CREATININE 0.80 10/07/2014 0926   CALCIUM 10.7* 12/03/2015 0908   CALCIUM 10.7* 12/03/2015 0908   PROT 6.6 12/03/2015 0908   ALBUMIN 4.0 12/03/2015 0908   AST 18 12/03/2015 0908   ALT 17 12/03/2015 0908   ALKPHOS 74 12/03/2015 0908   BILITOT 0.5 12/03/2015 0908   GFRNONAA 75* 08/17/2014 1942   GFRAA 86* 08/17/2014 1942    Diabetic Labs (most recent): Lab Results  Component Value Date   HGBA1C  6.0* 09/28/2012   HGBA1C 6.1* 11/07/2011     Lipid Panel ( most recent) Lipid Panel     Component Value Date/Time   CHOL 235* 12/03/2015 0908   TRIG 159* 12/03/2015 0908   HDL 41* 12/03/2015 0908   CHOLHDL 5.7* 12/03/2015 0908   VLDL 32* 12/03/2015 0908   LDLCALC 162* 12/03/2015 0908    - PTH level in her records revised: 12/03/2015 he was 196 11/26/2013 he was 209.   - Calcium level ranged from 11.8 from October 2016 - 10.7 from 12/03/2015  Results for ANDALASIA, OSTRAND (MRN [id number]) as of 04/03/2016 18:05  Ref. Range 01/22/2016 10:20  Calcium, Ur Latest Ref Range: Not estab mg/dL 15  Calcium, 24 hour urine Latest Ref Range: 35-250 mg/24 h 255 (H)     Assessment & Plan:   1.  Hyperparathyroidism, primary ([place]) - She has hypercalcemia, osteoporosis, and biochemical evidence indicative of primary hyperparathyroidism.  -She was approached with her options. She is a surgical candidate. I will send her to Dr. Armandina Gemma in [facility]. 2. Osteoporosis: Likely related to #1. -I discussed therapeutic options with her. At this point no contraindication for use of oral bisphosphonates and I would initiate Fosamax 70 mg by mouth every week. Side effects and appropriate precautions discussed with her. 3.  vitamin D deficiency -I encouraged her to continue to take vitamin D 50,000 units weekly.  - I advised patient to maintain close follow up with Vic Blackbird, MD for primary care needs. Follow up plan: Return in about 6 weeks (around 05/15/2016) for follow up with pre-visit labs.  Glade Lloyd, MD Phone: [phone]  Fax: [phone]   04/03/2016, 6:03 PM

## 2016-04-14 ENCOUNTER — Other Ambulatory Visit (HOSPITAL_COMMUNITY): Payer: Self-pay | Admitting: General Surgery

## 2016-04-14 DIAGNOSIS — E210 Primary hyperparathyroidism: Secondary | ICD-10-CM | POA: Diagnosis not present

## 2016-04-27 ENCOUNTER — Encounter (HOSPITAL_COMMUNITY)
Admission: RE | Admit: 2016-04-27 | Discharge: 2016-04-27 | Disposition: A | Discharge status: Home / Self Care | Payer: BLUE CROSS/BLUE SHIELD | Source: Ambulatory Visit | Attending: General Surgery | Admitting: General Surgery

## 2016-04-27 DIAGNOSIS — E210 Primary hyperparathyroidism: Secondary | ICD-10-CM | POA: Insufficient documentation

## 2016-04-27 DIAGNOSIS — E0590 Thyrotoxicosis, unspecified without thyrotoxic crisis or storm: Secondary | ICD-10-CM | POA: Diagnosis not present

## 2016-04-27 MED ORDER — TECHNETIUM TC 99M SESTAMIBI - CARDIOLITE: 25.0000 | Freq: Once | INTRAVENOUS | Status: DC | PRN

## 2016-05-03 ENCOUNTER — Other Ambulatory Visit: Payer: Self-pay | Admitting: Family Medicine

## 2016-05-04 NOTE — Telephone Encounter (Signed)
Refill appropriate and filled per protocol. 

## 2016-05-16 ENCOUNTER — Ambulatory Visit: Payer: BLUE CROSS/BLUE SHIELD | Admitting: "Endocrinology

## 2016-05-29 ENCOUNTER — Other Ambulatory Visit: Payer: BLUE CROSS/BLUE SHIELD

## 2016-05-31 ENCOUNTER — Other Ambulatory Visit: Payer: Self-pay | Admitting: Family Medicine

## 2016-06-01 NOTE — Telephone Encounter (Signed)
Refill appropriate and filled per protocol. 

## 2016-06-05 ENCOUNTER — Encounter: Payer: BLUE CROSS/BLUE SHIELD | Admitting: Family Medicine

## 2016-06-08 ENCOUNTER — Ambulatory Visit: Payer: Self-pay | Admitting: General Surgery

## 2016-06-08 DIAGNOSIS — E210 Primary hyperparathyroidism: Secondary | ICD-10-CM | POA: Diagnosis not present
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
## 2016-06-15 ENCOUNTER — Other Ambulatory Visit: Payer: Self-pay | Admitting: Family Medicine

## 2016-06-22 ENCOUNTER — Encounter (HOSPITAL_COMMUNITY): Payer: Self-pay

## 2016-06-22 ENCOUNTER — Encounter (HOSPITAL_COMMUNITY)
Admission: RE | Admit: 2016-06-22 | Discharge: 2016-06-22 | Disposition: A | Discharge status: Home / Self Care | Payer: BLUE CROSS/BLUE SHIELD | Source: Ambulatory Visit | Attending: General Surgery | Admitting: General Surgery

## 2016-06-22 DIAGNOSIS — D351 Benign neoplasm of parathyroid gland: Secondary | ICD-10-CM | POA: Diagnosis not present

## 2016-06-22 DIAGNOSIS — Z01810 Encounter for preprocedural cardiovascular examination: Secondary | ICD-10-CM | POA: Diagnosis not present

## 2016-06-22 DIAGNOSIS — Z01812 Encounter for preprocedural laboratory examination: Secondary | ICD-10-CM | POA: Insufficient documentation

## 2016-06-22 HISTORY — DX: Reserved for inherently not codable concepts without codable children: IMO0001

## 2016-06-22 HISTORY — DX: Cardiac murmur, unspecified: R01.1

## 2016-06-22 HISTORY — DX: Benign neoplasm of parathyroid gland: D35.1

## 2016-06-22 HISTORY — DX: Post-traumatic stress disorder, unspecified: F43.10

## 2016-06-22 HISTORY — DX: Other symptoms and signs involving the musculoskeletal system: R29.898

## 2016-06-22 HISTORY — DX: Personal history of urinary (tract) infections: Z87.440

## 2016-06-22 HISTORY — DX: Unspecified osteoarthritis, unspecified site: M19.90

## 2016-06-22 HISTORY — DX: Other seasonal allergic rhinitis: J30.2

## 2016-06-22 HISTORY — DX: Other abnormalities of gait and mobility: R26.89

## 2016-06-22 HISTORY — DX: Personal history of other diseases of the respiratory system: Z87.09

## 2016-06-22 HISTORY — DX: Other constipation: K59.09

## 2016-06-22 HISTORY — DX: Prediabetes: R73.03

## 2016-06-22 LAB — CBC WITH DIFFERENTIAL/PLATELET
BASOS ABS: 0 10*3/uL (ref 0.0–0.1)
BASOS PCT: 0 %
Eosinophils Absolute: 0.2 10*3/uL (ref 0.0–0.7)
Eosinophils Relative: 3 %
HEMATOCRIT: 40.3 % (ref 36.0–46.0)
HEMOGLOBIN: 13.1 g/dL (ref 12.0–15.0)
LYMPHS PCT: 36 %
Lymphs Abs: 2.3 10*3/uL (ref 0.7–4.0)
MCH: 28.5 pg (ref 26.0–34.0)
MCHC: 32.5 g/dL (ref 30.0–36.0)
MCV: 87.8 fL (ref 78.0–100.0)
MONO ABS: 0.4 10*3/uL (ref 0.1–1.0)
Monocytes Relative: 7 %
NEUTROS ABS: 3.6 10*3/uL (ref 1.7–7.7)
NEUTROS PCT: 54 %
Platelets: 344 10*3/uL (ref 150–400)
RBC: 4.59 MIL/uL (ref 3.87–5.11)
RDW: 13.7 % (ref 11.5–15.5)
WBC: 6.5 10*3/uL (ref 4.0–10.5)

## 2016-06-22 LAB — BASIC METABOLIC PANEL
Anion gap: 5 (ref 5–15)
BUN: 23 mg/dL — ABNORMAL HIGH (ref 6–20)
CALCIUM: 11.4 mg/dL — AB (ref 8.9–10.3)
CO2: 26 mmol/L (ref 22–32)
CREATININE: 1.15 mg/dL — AB (ref 0.44–1.00)
Chloride: 111 mmol/L (ref 101–111)
GFR, EST AFRICAN AMERICAN: 59 mL/min — AB (ref >60)
GFR, EST NON AFRICAN AMERICAN: 51 mL/min — AB (ref >60)
Glucose, Bld: 104 mg/dL — ABNORMAL HIGH (ref 65–99)
Potassium: 4.3 mmol/L (ref 3.5–5.1)
SODIUM: 142 mmol/L (ref 135–145)

## 2016-06-22 NOTE — Patient Instructions (Addendum)
Teresa Soto  06/22/2016   Your procedure is scheduled on:  Tuesday June 27, 2016  Report to [facility] Main  Entrance take [place]  elevators to 3rd floor to  [place] at 7:15 AM.  Call this number if you have problems the morning of surgery [phone]   Remember: ONLY 1 PERSON MAY GO WITH YOU TO SHORT STAY TO GET  READY MORNING OF [place].  Do not eat food or drink liquids :After Midnight.     Take these medicines the morning of surgery:  May use albuterol inhaler if needed; May use Nasonex if needed                                 You may not have any metal on your body including hair pins and              piercings  Do not wear jewelry, make-up, lotions, powders or perfumes, deodorant             Do not wear nail polish.  Do not shave  48 hours prior to surgery.     Do not bring valuables to the hospital. [place].  Contacts, dentures or bridgework may not be worn into surgery.      Patients discharged the day of surgery will not be allowed to drive home.  Name and phone number of your driver:Pasquale Coon (husband)  _____________________________________________________________________             [facility] - Preparing for Surgery Before surgery, you can play an important role.  Because skin is not sterile, your skin needs to be as free of germs as possible.  You can reduce the number of germs on your skin by washing with CHG (chlorahexidine gluconate) soap before surgery.  CHG is an antiseptic cleaner which kills germs and bonds with the skin to continue killing germs even after washing. Please DO NOT use if you have an allergy to CHG or antibacterial soaps.  If your skin becomes reddened/irritated stop using the CHG and inform your nurse when you arrive at Short Stay. Do not shave (including legs and underarms) for at least 48 hours prior to the first CHG shower.  You may  shave your face/neck. Please follow these instructions carefully:  1.  Shower with CHG Soap the night before surgery and the  morning of Surgery.  2.  If you choose to wash your hair, wash your hair first as usual with your  normal  shampoo.  3.  After you shampoo, rinse your hair and body thoroughly to remove the  shampoo.                           4.  Use CHG as you would any other liquid soap.  You can apply chg directly  to the skin and wash                       Gently with a scrungie or clean washcloth.  5.  Apply the CHG Soap to your body ONLY FROM THE NECK DOWN.   Do not use on face/ open  Wound or open sores. Avoid contact with eyes, ears mouth and genitals (private parts).                       Wash face,  Genitals (private parts) with your normal soap.             6.  Wash thoroughly, paying special attention to the area where your surgery  will be performed.  7.  Thoroughly rinse your body with warm water from the neck down.  8.  DO NOT shower/wash with your normal soap after using and rinsing off  the CHG Soap.                9.  Pat yourself dry with a clean towel.            10.  Wear clean pajamas.            11.  Place clean sheets on your bed the night of your first shower and do not  sleep with pets. Day of Surgery : Do not apply any lotions/deodorants the morning of surgery.  Please wear clean clothes to the hospital/surgery center.  FAILURE TO FOLLOW THESE INSTRUCTIONS MAY RESULT IN THE CANCELLATION OF YOUR SURGERY PATIENT SIGNATURE_________________________________  NURSE SIGNATURE__________________________________  ________________________________________________________________________

## 2016-06-23 LAB — HEMOGLOBIN A1C
HEMOGLOBIN A1C: 6 % — AB (ref 4.8–5.6)
MEAN PLASMA GLUCOSE: 126 mg/dL

## 2016-06-23 NOTE — Progress Notes (Signed)
BMP results in epic per PAT visit 06/22/2016 sent to Dr Redmond Pulling

## 2016-06-27 ENCOUNTER — Ambulatory Visit (HOSPITAL_COMMUNITY)
Admission: RE | Admit: 2016-06-27 | Discharge: 2016-06-27 | Disposition: A | Discharge status: Home / Self Care | Payer: BLUE CROSS/BLUE SHIELD | Source: Ambulatory Visit | Attending: General Surgery | Admitting: General Surgery

## 2016-06-27 ENCOUNTER — Encounter (HOSPITAL_COMMUNITY): Payer: Self-pay

## 2016-06-27 ENCOUNTER — Ambulatory Visit (HOSPITAL_COMMUNITY): Payer: BLUE CROSS/BLUE SHIELD | Admitting: Anesthesiology

## 2016-06-27 ENCOUNTER — Encounter (HOSPITAL_COMMUNITY)
Admission: RE | Disposition: A | Discharge status: Home / Self Care | Payer: Self-pay | Source: Ambulatory Visit | Attending: General Surgery

## 2016-06-27 DIAGNOSIS — I10 Essential (primary) hypertension: Secondary | ICD-10-CM | POA: Diagnosis not present

## 2016-06-27 DIAGNOSIS — M1990 Unspecified osteoarthritis, unspecified site: Secondary | ICD-10-CM | POA: Diagnosis not present

## 2016-06-27 DIAGNOSIS — J45909 Unspecified asthma, uncomplicated: Secondary | ICD-10-CM | POA: Diagnosis not present

## 2016-06-27 DIAGNOSIS — D351 Benign neoplasm of parathyroid gland: Secondary | ICD-10-CM | POA: Insufficient documentation

## 2016-06-27 DIAGNOSIS — I129 Hypertensive chronic kidney disease with stage 1 through stage 4 chronic kidney disease, or unspecified chronic kidney disease: Secondary | ICD-10-CM | POA: Diagnosis not present

## 2016-06-27 DIAGNOSIS — F329 Major depressive disorder, single episode, unspecified: Secondary | ICD-10-CM | POA: Insufficient documentation

## 2016-06-27 DIAGNOSIS — E210 Primary hyperparathyroidism: Secondary | ICD-10-CM | POA: Insufficient documentation

## 2016-06-27 DIAGNOSIS — Z8673 Personal history of transient ischemic attack (TIA), and cerebral infarction without residual deficits: Secondary | ICD-10-CM | POA: Insufficient documentation

## 2016-06-27 DIAGNOSIS — Z791 Long term (current) use of non-steroidal anti-inflammatories (NSAID): Secondary | ICD-10-CM | POA: Insufficient documentation

## 2016-06-27 DIAGNOSIS — Z87891 Personal history of nicotine dependence: Secondary | ICD-10-CM | POA: Diagnosis not present

## 2016-06-27 DIAGNOSIS — Z79899 Other long term (current) drug therapy: Secondary | ICD-10-CM | POA: Diagnosis not present

## 2016-06-27 HISTORY — PX: PARATHYROIDECTOMY: SHX19

## 2016-06-27 SURGERY — PARATHYROIDECTOMY
Anesthesia: General | Site: Neck

## 2016-06-27 MED ORDER — SUGAMMADEX SODIUM 200 MG/2ML IV SOLN
INTRAVENOUS | Status: DC | PRN
  Administered 2016-06-27: 150 mg via INTRAVENOUS

## 2016-06-27 MED ORDER — MENTHOL 3 MG MT LOZG
1.0000 | LOZENGE | OROMUCOSAL | Status: DC | PRN
  Filled 2016-06-27: qty 9

## 2016-06-27 MED ORDER — FENTANYL CITRATE (PF) 100 MCG/2ML IJ SOLN
INTRAMUSCULAR | Status: AC
  Filled 2016-06-27: qty 2

## 2016-06-27 MED ORDER — FENTANYL CITRATE (PF) 100 MCG/2ML IJ SOLN
INTRAMUSCULAR | Status: DC | PRN
  Administered 2016-06-27: 150 ug via INTRAVENOUS
  Administered 2016-06-27: 50 ug via INTRAVENOUS

## 2016-06-27 MED ORDER — CEFAZOLIN SODIUM-DEXTROSE 2-4 GM/100ML-% IV SOLN
2.0000 g | INTRAVENOUS | Status: AC
  Administered 2016-06-27: 2 g via INTRAVENOUS
  Filled 2016-06-27: qty 100

## 2016-06-27 MED ORDER — OXYCODONE HCL 5 MG PO TABS: 5.0000 mg | ORAL_TABLET | Freq: Four times a day (QID) | ORAL | 0 refills | Status: DC | PRN

## 2016-06-27 MED ORDER — FENTANYL CITRATE (PF) 100 MCG/2ML IJ SOLN
INTRAMUSCULAR | Status: DC
Start: 2016-06-27 — End: 2016-06-27
  Filled 2016-06-27: qty 2

## 2016-06-27 MED ORDER — FENTANYL CITRATE (PF) 100 MCG/2ML IJ SOLN
25.0000 ug | INTRAMUSCULAR | Status: DC | PRN
  Administered 2016-06-27: 50 ug via INTRAVENOUS

## 2016-06-27 MED ORDER — SUGAMMADEX SODIUM 200 MG/2ML IV SOLN
INTRAVENOUS | Status: AC
  Filled 2016-06-27: qty 2

## 2016-06-27 MED ORDER — DEXAMETHASONE SODIUM PHOSPHATE 10 MG/ML IJ SOLN
INTRAMUSCULAR | Status: AC
  Filled 2016-06-27: qty 1

## 2016-06-27 MED ORDER — LACTATED RINGERS IV SOLN
INTRAVENOUS | Status: DC
  Administered 2016-06-27: 11:00:00 via INTRAVENOUS
  Administered 2016-06-27: 1000 mL via INTRAVENOUS

## 2016-06-27 MED ORDER — PROPOFOL 10 MG/ML IV BOLUS
INTRAVENOUS | Status: DC | PRN
  Administered 2016-06-27: 130 mg via INTRAVENOUS

## 2016-06-27 MED ORDER — PHENYLEPHRINE HCL 10 MG/ML IJ SOLN
INTRAMUSCULAR | Status: DC | PRN
  Administered 2016-06-27: 80 ug via INTRAVENOUS

## 2016-06-27 MED ORDER — PHENYLEPHRINE 40 MCG/ML (10ML) SYRINGE FOR IV PUSH (FOR BLOOD PRESSURE SUPPORT)
PREFILLED_SYRINGE | INTRAVENOUS | Status: AC
  Filled 2016-06-27: qty 10

## 2016-06-27 MED ORDER — POTASSIUM CHLORIDE IN NACL 20-0.9 MEQ/L-% IV SOLN: INTRAVENOUS | Status: DC

## 2016-06-27 MED ORDER — BUPIVACAINE-EPINEPHRINE 0.5% -1:200000 IJ SOLN
INTRAMUSCULAR | Status: DC | PRN
  Administered 2016-06-27: 7 mL

## 2016-06-27 MED ORDER — PROMETHAZINE HCL 25 MG/ML IJ SOLN: 6.2500 mg | INTRAMUSCULAR | Status: DC | PRN

## 2016-06-27 MED ORDER — ONDANSETRON HCL 4 MG/2ML IJ SOLN
INTRAMUSCULAR | Status: DC | PRN
  Administered 2016-06-27: 4 mg via INTRAVENOUS

## 2016-06-27 MED ORDER — MIDAZOLAM HCL 2 MG/2ML IJ SOLN
INTRAMUSCULAR | Status: AC
  Filled 2016-06-27: qty 2

## 2016-06-27 MED ORDER — SODIUM CHLORIDE 0.9% FLUSH: 3.0000 mL | Freq: Two times a day (BID) | INTRAVENOUS | Status: DC

## 2016-06-27 MED ORDER — PROPOFOL 10 MG/ML IV BOLUS
INTRAVENOUS | Status: AC
  Filled 2016-06-27: qty 20

## 2016-06-27 MED ORDER — ACETAMINOPHEN 650 MG RE SUPP: 650.0000 mg | RECTAL | Status: DC | PRN

## 2016-06-27 MED ORDER — BUPIVACAINE-EPINEPHRINE (PF) 0.5% -1:200000 IJ SOLN
INTRAMUSCULAR | Status: AC
  Filled 2016-06-27: qty 30

## 2016-06-27 MED ORDER — SODIUM CHLORIDE 0.9 % IV SOLN: 250.0000 mL | INTRAVENOUS | Status: DC | PRN

## 2016-06-27 MED ORDER — ROCURONIUM BROMIDE 10 MG/ML (PF) SYRINGE
PREFILLED_SYRINGE | INTRAVENOUS | Status: DC | PRN
  Administered 2016-06-27: 50 mg via INTRAVENOUS

## 2016-06-27 MED ORDER — OXYCODONE HCL 5 MG PO TABS
5.0000 mg | ORAL_TABLET | ORAL | Status: DC | PRN
  Administered 2016-06-27: 5 mg via ORAL
  Filled 2016-06-27: qty 1

## 2016-06-27 MED ORDER — MIDAZOLAM HCL 2 MG/2ML IJ SOLN
INTRAMUSCULAR | Status: DC | PRN
  Administered 2016-06-27: 2 mg via INTRAVENOUS

## 2016-06-27 MED ORDER — MORPHINE SULFATE (PF) 10 MG/ML IV SOLN: 1.0000 mg | INTRAVENOUS | Status: DC | PRN

## 2016-06-27 MED ORDER — LIDOCAINE 2% (20 MG/ML) 5 ML SYRINGE
INTRAMUSCULAR | Status: DC | PRN
  Administered 2016-06-27: 100 mg via INTRAVENOUS

## 2016-06-27 MED ORDER — CHLORHEXIDINE GLUCONATE CLOTH 2 % EX PADS: 6.0000 | MEDICATED_PAD | Freq: Once | CUTANEOUS | Status: DC

## 2016-06-27 MED ORDER — 0.9 % SODIUM CHLORIDE (POUR BTL) OPTIME
TOPICAL | Status: DC | PRN
  Administered 2016-06-27: 1000 mL

## 2016-06-27 MED ORDER — DEXAMETHASONE SODIUM PHOSPHATE 10 MG/ML IJ SOLN
INTRAMUSCULAR | Status: DC | PRN
  Administered 2016-06-27: 10 mg via INTRAVENOUS

## 2016-06-27 MED ORDER — CEFAZOLIN SODIUM-DEXTROSE 2-4 GM/100ML-% IV SOLN
INTRAVENOUS | Status: AC
  Filled 2016-06-27: qty 100

## 2016-06-27 MED ORDER — ONDANSETRON HCL 4 MG/2ML IJ SOLN
INTRAMUSCULAR | Status: AC
  Filled 2016-06-27: qty 2

## 2016-06-27 MED ORDER — SODIUM CHLORIDE 0.9% FLUSH: 3.0000 mL | INTRAVENOUS | Status: DC | PRN

## 2016-06-27 MED ORDER — ACETAMINOPHEN 325 MG PO TABS: 650.0000 mg | ORAL_TABLET | ORAL | Status: DC | PRN

## 2016-06-27 MED ORDER — ROCURONIUM BROMIDE 100 MG/10ML IV SOLN
INTRAVENOUS | Status: AC
  Filled 2016-06-27: qty 1

## 2016-06-27 MED ORDER — LIDOCAINE 2% (20 MG/ML) 5 ML SYRINGE
INTRAMUSCULAR | Status: AC
  Filled 2016-06-27: qty 5

## 2016-06-27 SURGICAL SUPPLY — 48 items
ATTRACTOMAT 16X20 MAGNETIC DRP (DRAPES) ×3 IMPLANT
BENZOIN TINCTURE PRP APPL 2/3 (GAUZE/BANDAGES/DRESSINGS) ×3 IMPLANT
BLADE SURG 15 STRL LF DISP TIS (BLADE) ×1 IMPLANT
BLADE SURG 15 STRL SS (BLADE) ×2
CHLORAPREP W/TINT 26ML (MISCELLANEOUS) ×3 IMPLANT
CLIP TI MEDIUM 6 (CLIP) ×6 IMPLANT
CLIP TI WIDE RED SMALL 6 (CLIP) ×6 IMPLANT
CLOSURE WOUND 1/2 X4 (GAUZE/BANDAGES/DRESSINGS) ×1
COVER SURGICAL LIGHT HANDLE (MISCELLANEOUS) IMPLANT
DERMABOND ADVANCED (GAUZE/BANDAGES/DRESSINGS) ×2
DERMABOND ADVANCED .7 DNX12 (GAUZE/BANDAGES/DRESSINGS) ×1 IMPLANT
DISSECTOR ROUND CHERRY 3/8 STR (MISCELLANEOUS) IMPLANT
DRAPE LAPAROTOMY T 98X78 PEDS (DRAPES) ×3 IMPLANT
DRESSING SURGICEL FIBRLLR 1X2 (HEMOSTASIS) ×1 IMPLANT
DRSG SURGICEL FIBRILLAR 1X2 (HEMOSTASIS) ×3
ELECT COATED BLADE 2.86 ST (ELECTRODE) ×3 IMPLANT
ELECT PENCIL ROCKER SW 15FT (MISCELLANEOUS) ×3 IMPLANT
ELECT REM PT RETURN 9FT ADLT (ELECTROSURGICAL) ×3
ELECTRODE REM PT RTRN 9FT ADLT (ELECTROSURGICAL) ×1 IMPLANT
GAUZE SPONGE 4X4 12PLY STRL (GAUZE/BANDAGES/DRESSINGS) ×3 IMPLANT
GAUZE SPONGE 4X4 16PLY XRAY LF (GAUZE/BANDAGES/DRESSINGS) ×3 IMPLANT
GLOVE BIO SURGEON STRL SZ7.5 (GLOVE) ×3 IMPLANT
GLOVE BIOGEL PI IND STRL 7.0 (GLOVE) ×1 IMPLANT
GLOVE BIOGEL PI INDICATOR 7.0 (GLOVE) ×2
GLOVE INDICATOR 8.0 STRL GRN (GLOVE) ×3 IMPLANT
GOWN STRL REUS W/TWL LRG LVL3 (GOWN DISPOSABLE) ×3 IMPLANT
GOWN STRL REUS W/TWL XL LVL3 (GOWN DISPOSABLE) ×9 IMPLANT
ILLUMINATOR WAVEGUIDE N/F (MISCELLANEOUS) ×3 IMPLANT
KIT BASIN OR (CUSTOM PROCEDURE TRAY) ×3 IMPLANT
MARKER SKIN DUAL TIP RULER LAB (MISCELLANEOUS) ×3 IMPLANT
NEEDLE HYPO 22GX1.5 SAFETY (NEEDLE) IMPLANT
NS IRRIG 1000ML POUR BTL (IV SOLUTION) ×3 IMPLANT
PACK BASIC VI WITH GOWN DISP (CUSTOM PROCEDURE TRAY) ×3 IMPLANT
SHEARS HARMONIC 9CM CVD (BLADE) IMPLANT
STAPLER VISISTAT 35W (STAPLE) ×3 IMPLANT
STRIP CLOSURE SKIN 1/2X4 (GAUZE/BANDAGES/DRESSINGS) ×2 IMPLANT
SUT MNCRL AB 4-0 PS2 18 (SUTURE) ×3 IMPLANT
SUT SILK 2 0 (SUTURE)
SUT SILK 2-0 18XBRD TIE 12 (SUTURE) IMPLANT
SUT SILK 3 0 (SUTURE)
SUT SILK 3-0 18XBRD TIE 12 (SUTURE) IMPLANT
SUT VIC AB 3-0 SH 18 (SUTURE) ×3 IMPLANT
SUT VIC AB 3-0 SH 27 (SUTURE) ×2
SUT VIC AB 3-0 SH 27XBRD (SUTURE) ×1 IMPLANT
SYR BULB IRRIGATION 50ML (SYRINGE) ×3 IMPLANT
SYR CONTROL 10ML LL (SYRINGE) ×3 IMPLANT
TOWEL OR 17X26 10 PK STRL BLUE (TOWEL DISPOSABLE) ×3 IMPLANT
YANKAUER SUCT BULB TIP 10FT TU (MISCELLANEOUS) ×3 IMPLANT

## 2016-06-27 NOTE — Interval H&P Note (Signed)
History and Physical Interval Note:  06/27/2016 8:52 AM  Teresa Soto  has presented today for surgery, with the diagnosis of PARATHYROID ADENOMA  The various methods of treatment have been discussed with the patient and family. After consideration of risks, benefits and other options for treatment, the patient has consented to  Procedure(s): MINIMALLY INVASIVE PARATHYROIDECTOMY (N/A) as a surgical intervention .  The patient's history has been reviewed, patient examined, no change in status, stable for surgery.  I have reviewed the patient's chart and labs.  Questions were answered to the patient's satisfaction.    Leighton Ruff. Redmond Pulling, MD, [place], Bariatric, & Minimally Invasive Surgery [facility], [place]  [facility] M

## 2016-06-27 NOTE — Transfer of Care (Signed)
Immediate Anesthesia Transfer of Care Note  Patient: Teresa Soto  Procedure(s) Performed: Procedure(s): MINIMALLY INVASIVE PARATHYROIDECTOMY (N/A)  Patient Location: PACU  Anesthesia Type:General  Level of Consciousness: awake  Airway & Oxygen Therapy: Patient Spontanous Breathing and Patient connected to face mask oxygen  Post-op Assessment: Report given to RN and Post -op Vital signs reviewed and stable  Post vital signs: Reviewed and stable  Last Vitals:  Vitals:   06/27/16 0717 06/27/16 1100  BP: (!) 159/96   Pulse: 76 93  Resp: 16 (!) 8  Temp: 37.2 C     Last Pain:  Vitals:   06/27/16 0851  TempSrc:   PainSc: 0-No pain      Patients Stated Pain Goal: 4 ([id number])  Complications: No apparent anesthesia complications

## 2016-06-27 NOTE — Anesthesia Preprocedure Evaluation (Addendum)
Anesthesia Evaluation  Patient identified by MRN, date of birth, ID band Patient awake    Reviewed: Allergy & Precautions, NPO status , Patient's Chart, lab work & pertinent test results  Airway Mallampati: III  TM Distance: >3 FB Neck ROM: Full    Dental  (+) Teeth Intact, Dental Advisory Given   Pulmonary asthma , former smoker,    Pulmonary exam normal breath sounds clear to auscultation       Cardiovascular Exercise Tolerance: Good hypertension, Pt. on medications (-) angina(-) Past MI Normal cardiovascular exam Rhythm:Regular Rate:Normal  ECHO 12/13: Study Conclusions  - Left ventricle: Tech difficult study. The cavity size was normal. Wall thickness was normal. The estimated ejectionfraction was 60%. Regional wall motion abnormalitiescannot be excluded. - Impressions: No cardiac source of embolism was identified,but cannot be ruled out on the basis of this examination.    Neuro/Psych PSYCHIATRIC DISORDERS Depression CVA, Residual Symptoms    GI/Hepatic negative GI ROS, Neg liver ROS,   Endo/Other  Parathyroid adenoma  Renal/GU Renal InsufficiencyRenal disease     Musculoskeletal  (+) Arthritis , Osteoarthritis,    Abdominal   Peds  Hematology negative hematology ROS (+)   Anesthesia Other Findings Day of surgery medications reviewed with the patient.  Reproductive/Obstetrics                            Anesthesia Physical Anesthesia Plan  ASA: III  Anesthesia Plan: General   Post-op Pain Management:    Induction: Intravenous  Airway Management Planned: Oral ETT  Additional Equipment:   Intra-op Plan:   Post-operative Plan: Extubation in OR  Informed Consent: I have reviewed the patients History and Physical, chart, labs and discussed the procedure including the risks, benefits and alternatives for the proposed anesthesia with the patient or authorized representative  who has indicated his/her understanding and acceptance.   Dental advisory given  Plan Discussed with: CRNA  Anesthesia Plan Comments: (Risks/benefits of general anesthesia discussed with patient including risk of damage to teeth, lips, gum, and tongue, nausea/vomiting, allergic reactions to medications, and the possibility of heart attack, stroke and death.  All patient questions answered.  Patient wishes to proceed.)        Anesthesia Quick Evaluation

## 2016-06-27 NOTE — Brief Op Note (Signed)
06/27/2016  10:53 AM  PATIENT:  Teresa Soto  60 y.o. female  PRE-OPERATIVE DIAGNOSIS:  PARATHYROID ADENOMA; primary hyperparathyroidism  POST-OPERATIVE DIAGNOSIS:  LEFT INFERIOR PARATHYROID ADENOMA; primary hyperparathyroidism  PROCEDURE:  Procedure(s): MINIMALLY INVASIVE PARATHYROIDECTOMY (N/A)  SURGEON:  Surgeon(s) and Role:    * Greer Pickerel, MD - Primary    * Armandina Gemma, MD - Assisting  PHYSICIAN ASSISTANT:   ASSISTANTS: see above   ANESTHESIA:   general  EBL:  Total I/O In: 1000 [I.V.:1000] Out: -   BLOOD ADMINISTERED:none  DRAINS: none   LOCAL MEDICATIONS USED:  MARCAINE     SPECIMEN:  Source of Specimen:  left inferior parathyroid  DISPOSITION OF SPECIMEN:  PATHOLOGY  COUNTS:  YES  TOURNIQUET:  * No tourniquets in log *  DICTATION: .Other Dictation: Dictation Number [id number]  PLAN OF CARE: Discharge to home after PACU  PATIENT DISPOSITION:  PACU - hemodynamically stable.   Delay start of Pharmacological VTE agent (>24hrs) due to surgical blood loss or risk of bleeding: not applicable

## 2016-06-27 NOTE — Progress Notes (Signed)
cepocol throat losenge given to pt per Dr Redmond Pulling

## 2016-06-27 NOTE — Anesthesia Procedure Notes (Signed)
Procedure Name: Intubation Date/Time: 06/27/2016 9:45 AM Performed by: Danley Danker L Patient Re-evaluated:Patient Re-evaluated prior to inductionOxygen Delivery Method: Circle system utilized Preoxygenation: Pre-oxygenation with 100% oxygen Intubation Type: IV induction Ventilation: Mask ventilation without difficulty and Oral airway inserted - appropriate to patient size Laryngoscope Size: Sabra Heck and 2 Grade View: Grade I Tube type: Reinforced Tube size: 7.0 mm Number of attempts: 1 Airway Equipment and Method: Stylet Placement Confirmation: ETT inserted through vocal cords under direct vision,  positive ETCO2 and breath sounds checked- equal and bilateral Secured at: 21 cm Tube secured with: Tape Dental Injury: Teeth and Oropharynx as per pre-operative assessment
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
## 2016-06-27 NOTE — Anesthesia Postprocedure Evaluation (Signed)
Anesthesia Post Note  Patient: Teresa Soto  Procedure(s) Performed: Procedure(s) (LRB): MINIMALLY INVASIVE PARATHYROIDECTOMY (N/A)  Patient location during evaluation: PACU Anesthesia Type: General Level of consciousness: awake and alert Pain management: pain level controlled Vital Signs Assessment: post-procedure vital signs reviewed and stable Respiratory status: spontaneous breathing, nonlabored ventilation, respiratory function stable and patient connected to nasal cannula oxygen Cardiovascular status: blood pressure returned to baseline and stable Postop Assessment: no signs of nausea or vomiting Anesthetic complications: no    Last Vitals:  Vitals:   06/27/16 1201 06/27/16 1340  BP: (!) 175/79 (!) 154/80  Pulse: 83 87  Resp: 16 16  Temp: 36.7 C     Last Pain:  Vitals:   06/27/16 1340  TempSrc:   PainSc: 3                  Catalina Gravel

## 2016-06-27 NOTE — Discharge Instructions (Signed)
[facility], [place] [phone]  THYROID/ PARATHYROID SURGERY: POST OP INSTRUCTIONS  Always review your discharge instruction sheet given to you by the facility where your surgery was performed.  IF YOU HAVE DISABILITY OR FAMILY LEAVE FORMS, YOU MUST BRING THEM TO THE OFFICE FOR PROCESSING.  PLEASE DO NOT GIVE THEM TO YOUR DOCTOR.  1. A prescription for pain medication may be given to you upon discharge.  Take your pain medication as prescribed, if needed.  If narcotic pain medicine is not needed, then you may take acetaminophen (Tylenol) or ibuprofen (Advil) as needed. 2. Take your usually prescribed medications unless otherwise directed. 3. If you need a refill on your pain medication, please contact your pharmacy. They will contact our office to request authorization.  Prescriptions will not be filled after 5pm or on week-ends. 4. You should follow a light diet the first 24 hours after arrival home, such as soup and crackers, etc.  Be sure to include lots of fluids daily.  Resume your normal diet the day after surgery. 5. Most patients will experience some swelling and bruising on the chest and neck area.  Ice packs will help.  Swelling and bruising can take several days to resolve.  6. It is common to experience some constipation if taking pain medication after surgery.  Increasing fluid intake and taking a stool softener will usually help or prevent this problem from occurring.  A mild laxative (Milk of Magnesia or Miralax) should be taken according to package directions if there are no bowel movements after 48 hours. 7. If your surgeon used skin glue on the incision, you may shower in 24 hours.  The glue will flake off over the next 2-3 weeks.  Any sutures or staples will be removed at the office during your follow-up visit. 8. ACTIVITIES:  You may resume regular (light) daily activities beginning the next day--such as daily self-care, walking, climbing stairs--gradually  increasing activities as tolerated.  You may have sexual intercourse when it is comfortable.  Refrain from any heavy lifting or straining until approved by your doctor. a. You may drive when you no longer are taking prescription pain medication, you can comfortably wear a seatbelt, and you can safely maneuver your car and apply brakes b. RETURN TO WORK:  __________________________________________________________ 9. You should see your doctor in the office for a follow-up appointment approximately two weeks after your surgery.  Make sure that you call for this appointment within a day or two after you arrive home to insure a convenient appointment time. 10. OTHER INSTRUCTIONS: ____________________________________________________________________________ _________________________________________________________________________________________________________________ _________________________________________________________________________________________________________________   WHEN TO CALL YOUR DOCTOR: 1. Fever over 101.0 2. Inability to urinate 3. Nausea and/or vomiting 4. Extreme swelling or bruising 5. Continued bleeding from incision. 6. Increased pain, redness, or drainage from the incision. 7. Difficulty swallowing or breathing 8. Muscle cramping or spasms. 9. Numbness or tingling in hands or feet or around lips.  The clinic staff is available to answer your questions during regular business hours.  Please dont hesitate to call and ask to speak to one of the nurses if you have concerns.  For further questions, please visit [email or web address]                            General Anesthesia, Adult, Care After Refer to this sheet in the next few weeks. These instructions provide you with information on caring for yourself after your procedure.  Your health care provider may also give you more specific instructions. Your treatment has been planned according to current medical  practices, but problems sometimes occur. Call your health care provider if you have any problems or questions after your procedure. WHAT TO EXPECT AFTER THE PROCEDURE After the procedure, it is typical to experience:  Sleepiness.  Nausea and vomiting. HOME CARE INSTRUCTIONS  For the first 24 hours after general anesthesia:  Have a responsible person with you.  Do not drive a car. If you are alone, do not take public transportation.  Do not drink alcohol.  Do not take medicine that has not been prescribed by your health care provider.  Do not sign important papers or make important decisions.  You may resume a normal diet and activities as directed by your health care provider.  If you have questions or problems that seem related to general anesthesia, call the hospital and ask for the anesthetist or anesthesiologist on call. SEEK MEDICAL CARE IF:  You have nausea and vomiting that continue the day after anesthesia.  You develop a rash. SEEK IMMEDIATE MEDICAL CARE IF:   You have difficulty breathing.  You have chest pain.  You have any allergic problems.   This information is not intended to replace advice given to you by your health care provider. Make sure you discuss any questions you have with your health care provider.   Document Released: 01/01/2001 Document Revised: 10/16/2014 Document Reviewed: 01/24/2012 Elsevier Interactive Patient Education [employer].

## 2016-06-28 NOTE — Op Note (Signed)
NAMECONNEE, RUBACH NO.:  [id number]  MEDICAL RECORD NO.:  [id number]  LOCATION:  [facility]                         FACILITY:  [facility]  PHYSICIAN:  Leighton Ruff. Redmond Pulling, MD, FACSDATE OF BIRTH:  06-01-1956  DATE OF PROCEDURE:  06/27/2016 DATE OF DISCHARGE:  06/27/2016                              OPERATIVE REPORT   PREOPERATIVE DIAGNOSES: 1. Parathyroid adenoma. 2. Primary hyperparathyroidism.  POSTOPERATIVE DIAGNOSES: 1. Left inferior parathyroid adenoma. 2. Primary hyperparathyroidism.  PROCEDURE:  Minimally invasive parathyroidectomy.  SURGEON:  Leighton Ruff. Redmond Pulling, MD, FACS  ASSISTANT SURGEON:  Earnstine Regal, MD FACS  ANESTHESIA:  General.  ESTIMATED BLOOD LOSS:  Minimal.  SPECIMEN:  Left inferior parathyroid.  INDICATIONS FOR PROCEDURE:  The patient is a 60 year old female, who was found to have hypercalcemia.  She also had early-onset osteoporosis. Metabolic workup found that she had primary hyperparathyroidism.  A sestamibi scan demonstrated an area of increased uptake in the left inferior thyroid location consistent with parathyroid adenoma.  We discussed at length the risks and benefits of the procedure.  Please see my notes regarding set discussion.  DESCRIPTION OF PROCEDURE:  After obtaining informed consent, she was brought to the operating room, placed supine on the operating room table.  General endotracheal anesthesia was established.  Her arms were tucked at her side with the appropriate padding.  Sequential compression devices were placed.  Her neck was slightly hyperextended.  Her neck and upper chest were prepped and draped in the usual standard surgical fashion with ChloraPrep.  A surgical time-out was performed.  IV antibiotic was administered.  A marking pen was used to outline a transverse Kocher incision about 1 fingerbreadth above the sternal notch in a skin crease for about 4 cm.  A skin incision was made with a 15 blade.  Deep dermis  was divided.  There was a superficial vein that came across and ligated with small clips.  The platysma was divided and then flaps were raised superior and inferiorly.  A self-retaining retractor was placed.  The strap muscles in the midline were divided and retracted toward the left side to expose the inferior lobe of the left thyroid gland.  Some of the avascular tissue was divided with right angle and electrocautery.  We came across a bulbous structure coming out of the inferior thyroid location.  The capsule encasing it was mobilized with right angle, electrocautery, and small clips.  The vascular supply to the inferior parathyroid was isolated with medium clip proximally, then ligated distally with electrocautery.  The dissection stayed right on the parathyroid gland.  We did not identify or visualize the recurrent laryngeal nerve.  We stayed right on the parathyroid and was able to easily dissect out from the surrounding avascular tissue, it was sent to Pathology for frozen.  The cavity was irrigated.  Hemostasis was achieved.  Fibrillar was placed in the pocket.  The strap muscles were then reapproximated with multiple interrupted 3-0 Vicryl sutures. Pathology confirmed the specimen was consistent with parathyroid adenoma measuring 6 g.  The platysma was reapproximated with inverted interrupted 3-0 Vicryl sutures.  The skin was closed with a running 4-0 Monocryl in a subcuticular fashion followed  by application of Dermabond. All needle, instrument, and sponge counts were correct x2.  There were no immediate complications.  The patient tolerated procedure well.  She was extubated and taken to recovery room in stable condition.     Leighton Ruff. Redmond Pulling, MD, FACS     EMW/MEDQ  D:  06/27/2016  T:  06/28/2016  Job:  [id number]

## 2016-07-12 DIAGNOSIS — E892 Postprocedural hypoparathyroidism: Secondary | ICD-10-CM | POA: Diagnosis not present

## 2016-08-04 ENCOUNTER — Other Ambulatory Visit: Payer: Self-pay | Admitting: Family Medicine

## 2016-09-13 ENCOUNTER — Other Ambulatory Visit: Payer: Self-pay | Admitting: Family Medicine

## 2016-09-18 ENCOUNTER — Ambulatory Visit (INDEPENDENT_AMBULATORY_CARE_PROVIDER_SITE_OTHER): Payer: BLUE CROSS/BLUE SHIELD | Admitting: Otolaryngology

## 2016-09-18 DIAGNOSIS — H8101 Meniere's disease, right ear: Secondary | ICD-10-CM

## 2016-09-18 DIAGNOSIS — H9041 Sensorineural hearing loss, unilateral, right ear, 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: Secondary | ICD-10-CM | POA: Diagnosis not present

## 2016-09-29 ENCOUNTER — Other Ambulatory Visit: Payer: Self-pay | Admitting: "Endocrinology

## 2016-09-29 ENCOUNTER — Other Ambulatory Visit: Payer: Self-pay | Admitting: Family Medicine

## 2016-10-17 ENCOUNTER — Other Ambulatory Visit: Payer: Self-pay | Admitting: Family Medicine

## 2016-11-02 ENCOUNTER — Other Ambulatory Visit: Payer: Self-pay | Admitting: Family Medicine

## 2016-11-06 ENCOUNTER — Other Ambulatory Visit: Payer: Self-pay | Admitting: Family Medicine

## 2016-11-10 DIAGNOSIS — M47816 Spondylosis without myelopathy or radiculopathy, lumbar region: Secondary | ICD-10-CM | POA: Diagnosis not present

## 2016-11-10 DIAGNOSIS — M5126 Other intervertebral disc displacement, lumbar region: Secondary | ICD-10-CM | POA: Diagnosis not present

## 2016-11-22 ENCOUNTER — Ambulatory Visit (INDEPENDENT_AMBULATORY_CARE_PROVIDER_SITE_OTHER): Payer: BLUE CROSS/BLUE SHIELD | Admitting: Family Medicine

## 2016-11-22 ENCOUNTER — Encounter: Payer: Self-pay | Admitting: Family Medicine

## 2016-11-22 ENCOUNTER — Encounter (INDEPENDENT_AMBULATORY_CARE_PROVIDER_SITE_OTHER): Payer: Self-pay | Admitting: *Deleted

## 2016-11-22 VITALS — BP 138/86 | HR 98 | Temp 98.9°F | Resp 14 | Ht 61.0 in | Wt 178.0 lb

## 2016-11-22 DIAGNOSIS — E210 Primary hyperparathyroidism: Secondary | ICD-10-CM

## 2016-11-22 DIAGNOSIS — J4520 Mild intermittent asthma, uncomplicated: Secondary | ICD-10-CM

## 2016-11-22 DIAGNOSIS — E6609 Other obesity due to excess calories: Secondary | ICD-10-CM

## 2016-11-22 DIAGNOSIS — E119 Type 2 diabetes mellitus without complications: Secondary | ICD-10-CM | POA: Insufficient documentation

## 2016-11-22 DIAGNOSIS — Z0000 Encounter for general adult medical examination without abnormal findings: Secondary | ICD-10-CM | POA: Diagnosis not present

## 2016-11-22 DIAGNOSIS — I10 Essential (primary) hypertension: Secondary | ICD-10-CM | POA: Diagnosis not present

## 2016-11-22 DIAGNOSIS — M8000XA Age-related osteoporosis with current pathological fracture, unspecified site, initial encounter for fracture: Secondary | ICD-10-CM

## 2016-11-22 DIAGNOSIS — N898 Other specified noninflammatory disorders of vagina: Secondary | ICD-10-CM | POA: Diagnosis not present

## 2016-11-22 DIAGNOSIS — Z1231 Encounter for screening mammogram for malignant neoplasm of breast: Secondary | ICD-10-CM

## 2016-11-22 DIAGNOSIS — E782 Mixed hyperlipidemia: Secondary | ICD-10-CM

## 2016-11-22 DIAGNOSIS — R7302 Impaired glucose tolerance (oral): Secondary | ICD-10-CM

## 2016-11-22 DIAGNOSIS — B360 Pityriasis versicolor: Secondary | ICD-10-CM

## 2016-11-22 DIAGNOSIS — Z1159 Encounter for screening for other viral diseases: Secondary | ICD-10-CM

## 2016-11-22 DIAGNOSIS — Z1239 Encounter for other screening for malignant neoplasm of breast: Secondary | ICD-10-CM

## 2016-11-22 DIAGNOSIS — Z124 Encounter for screening for malignant neoplasm of cervix: Secondary | ICD-10-CM | POA: Diagnosis not present

## 2016-11-22 DIAGNOSIS — Z1211 Encounter for screening for malignant neoplasm of colon: Secondary | ICD-10-CM

## 2016-11-22 DIAGNOSIS — Z6833 Body mass index (BMI) 33.0-33.9, adult: Secondary | ICD-10-CM

## 2016-11-22 LAB — COMPREHENSIVE METABOLIC PANEL
ALBUMIN: 3.9 g/dL (ref 3.6–5.1)
ALT: 20 U/L (ref 6–29)
AST: 19 U/L (ref 10–35)
Alkaline Phosphatase: 43 U/L (ref 33–130)
BILIRUBIN TOTAL: 0.3 mg/dL (ref 0.2–1.2)
BUN: 22 mg/dL (ref 7–25)
CALCIUM: 10.8 mg/dL — AB (ref 8.6–10.4)
CO2: 25 mmol/L (ref 20–31)
CREATININE: 1.23 mg/dL — AB (ref 0.50–1.05)
Chloride: 103 mmol/L (ref 98–110)
Glucose, Bld: 210 mg/dL — ABNORMAL HIGH (ref 70–99)
Potassium: 4.1 mmol/L (ref 3.5–5.3)
SODIUM: 140 mmol/L (ref 135–146)
Total Protein: 6.6 g/dL (ref 6.1–8.1)

## 2016-11-22 LAB — LIPID PANEL
CHOLESTEROL: 220 mg/dL — AB (ref <200)
HDL: 39 mg/dL — ABNORMAL LOW (ref >50)
LDL Cholesterol: 117 mg/dL — ABNORMAL HIGH (ref <100)
TRIGLYCERIDES: 320 mg/dL — AB (ref <150)
Total CHOL/HDL Ratio: 5.6 Ratio — ABNORMAL HIGH (ref <5.0)
VLDL: 64 mg/dL — AB (ref <30)

## 2016-11-22 LAB — WET PREP FOR TRICH, YEAST, CLUE
Trich, Wet Prep: NONE SEEN
Yeast Wet Prep HPF POC: NONE SEEN

## 2016-11-22 LAB — CBC WITH DIFFERENTIAL/PLATELET
Basophils Absolute: 0 cells/uL (ref 0–200)
Basophils Relative: 0 %
EOS PCT: 3 %
Eosinophils Absolute: 201 cells/uL (ref 15–500)
HCT: 39.8 % (ref 35.0–45.0)
Hemoglobin: 12.9 g/dL (ref 12.0–15.0)
LYMPHS PCT: 31 %
Lymphs Abs: 2077 cells/uL (ref 850–3900)
MCH: 28 pg (ref 27.0–33.0)
MCHC: 32.4 g/dL (ref 32.0–36.0)
MCV: 86.5 fL (ref 80.0–100.0)
MPV: 9.5 fL (ref 7.5–12.5)
Monocytes Absolute: 469 cells/uL (ref 200–950)
Monocytes Relative: 7 %
NEUTROS PCT: 59 %
Neutro Abs: 3953 cells/uL (ref 1500–7800)
Platelets: 323 10*3/uL (ref 140–400)
RBC: 4.6 MIL/uL (ref 3.80–5.10)
RDW: 14.7 % (ref 11.0–15.0)
WBC: 6.7 10*3/uL (ref 3.8–10.8)

## 2016-11-22 LAB — TSH: TSH: 1.48 mIU/L

## 2016-11-22 MED ORDER — NAPROXEN 500 MG PO TABS: 500.0000 mg | ORAL_TABLET | Freq: Two times a day (BID) | ORAL | 3 refills | Status: DC

## 2016-11-22 MED ORDER — ALBUTEROL SULFATE 108 (90 BASE) MCG/ACT IN AEPB: 2.0000 | INHALATION_SPRAY | RESPIRATORY_TRACT | 3 refills | Status: DC | PRN

## 2016-11-22 MED ORDER — ESCITALOPRAM OXALATE 20 MG PO TABS: 20.0000 mg | ORAL_TABLET | Freq: Every day | ORAL | 3 refills | Status: DC

## 2016-11-22 MED ORDER — GABAPENTIN 300 MG PO CAPS: 300.0000 mg | ORAL_CAPSULE | Freq: Every day | ORAL | 3 refills | Status: DC

## 2016-11-22 MED ORDER — MOMETASONE FUROATE 50 MCG/ACT NA SUSP: 2.0000 | Freq: Every day | NASAL | 3 refills | Status: DC | PRN

## 2016-11-22 MED ORDER — FLUCONAZOLE 100 MG PO TABS: ORAL_TABLET | ORAL | 0 refills | Status: DC

## 2016-11-22 MED ORDER — ONDANSETRON HCL 4 MG PO TABS: 4.0000 mg | ORAL_TABLET | Freq: Four times a day (QID) | ORAL | 3 refills | Status: DC

## 2016-11-22 MED ORDER — FENOFIBRATE 48 MG PO TABS: ORAL_TABLET | ORAL | 3 refills | Status: DC

## 2016-11-22 MED ORDER — MONTELUKAST SODIUM 10 MG PO TABS: ORAL_TABLET | ORAL | 3 refills | Status: DC

## 2016-11-22 MED ORDER — PRAMIPEXOLE DIHYDROCHLORIDE 0.25 MG PO TABS: 0.2500 mg | ORAL_TABLET | Freq: Every day | ORAL | 3 refills | Status: DC

## 2016-11-22 NOTE — Assessment & Plan Note (Signed)
Discussed diet, given handouts on carb/ portion control

## 2016-11-22 NOTE — Patient Instructions (Addendum)
Schedule your mammogram Referral colonoscopy-  We will call with lab results  Stop the lisinopril, check your blood pressure F/U 6 months

## 2016-11-22 NOTE — Assessment & Plan Note (Signed)
Fluconazole x 4 weeks nizoral shampoo for maintanance

## 2016-11-22 NOTE — Assessment & Plan Note (Signed)
Continue fosamax Check PTH , calciium, Vitamin D, has not followed up with endocrine

## 2016-11-22 NOTE — Assessment & Plan Note (Signed)
Refilled inhaler

## 2016-11-22 NOTE — Progress Notes (Signed)
Subjective:    Patient ID: Teresa Soto, female    DOB: 1955/12/26, 61 y.o.   MRN: [id number]  Patient presents for CPE w/ PAP (is not fasting)   Pt here for CPE     Colonosocpy due-    PAP Smear- due , has had mild discharge, no bleeding   Mammo-due   Meds reviewed     Had 1 parathyroid removed in Sept , Dr. Greer Pickerel, still has some hoarse voice/voice changes       HTN- Has been off medication for 1 week, feels better off the medication, has had dizzy spells    Dr. Carloyn Manner - following her for back pain, DDD lumbar   Review Of Systems:  GEN- denies fatigue, fever, weight loss,weakness, recent illness HEENT- denies eye drainage, change in vision, nasal discharge, CVS- denies chest pain, palpitations RESP- denies SOB, cough, wheeze ABD- denies N/V, change in stools, abd pain GU- denies dysuria, hematuria, dribbling, incontinence MSK- +joint pain, muscle aches, injury Neuro- denies headache, dizziness, syncope, seizure activity       Objective:    BP 138/86   Pulse 98   Temp 98.9 F (37.2 C) (Oral)   Resp 14   Ht 5\' 1"  (1.549 m)   Wt 178 lb (80.7 kg)   SpO2 98%   BMI 33.63 kg/m  GEN- NAD, alert and oriented x3 HEENT- PERRL, EOMI, non injected sclera, pink conjunctiva, MMM, oropharynx clear Neck- Supple, no thyromegaly CVS- RRR, no murmur Breast- normal symmetry, no nipple inversion,no nipple drainage, no nodules or lumps felt Nodes- no axillary nodes Skin- recurrent tinea versicolor RESP-CTAB ABD-NABS,soft,NT,ND GU- normal external genitalia, vaginal mucosa pink and moist, cervix visualized no growth, no blood form os, minimal thin clear discharge, no CMT, no ovarian masses, uterus normal size EXT- No edema Pulses- Radial, DP- 2+        Assessment & Plan:      Problem List Items Addressed This Visit    Osteoporosis - Primary    Continue fosamax Check PTH , calciium, Vitamin D, has not followed up with endocrine      Relevant Orders   Vitamin D,  25-hydroxy   Obesity    Discussed diet, given handouts on carb/ portion control      Hyperparathyroidism, primary ([place])   Relevant Orders   PTH, Intact and Calcium   Hyperlipidemia   Relevant Medications   fenofibrate (TRICOR) 48 MG tablet   Other Relevant Orders   Lipid panel   Glucose intolerance (impaired glucose tolerance)   Relevant Orders   Hemoglobin A1c   Essential hypertension    Bp looks good off medications, will have her monitor off the low dose lisinopil  check lipids Check A1C glucose intolerance      Relevant Medications   fenofibrate (TRICOR) 48 MG tablet   Other Relevant Orders   TSH   Asthma    Refilled inhaler       Relevant Medications   montelukast (SINGULAIR) 10 MG tablet   Albuterol Sulfate (PROAIR RESPICLICK) 123XX123 (90 Base) MCG/ACT AEPB    Other Visit Diagnoses    Routine general medical examination at a health care facility       CPE, colonsccopy needed, mammogram due , hep C screening, PAP Smear   Relevant Orders   CBC with Differential/Platelet   Comprehensive metabolic panel   Cervical cancer screening       Relevant Orders   PAP, ThinPrep ASCUS Rflx HPV Rflx Type  Colon cancer screening       Relevant Orders   Ambulatory referral to Gastroenterology   Vaginal discharge       Relevant Orders   WET PREP FOR [place] (Completed)   Breast cancer screening       Relevant Orders   MM SCREENING BREAST TOMO BILATERAL   Need for hepatitis C screening test       Relevant Orders   Hepatitis C antibody      Note: This dictation was prepared with Dragon dictation along with smaller phrase technology. Any transcriptional errors that result from this process are unintentional.

## 2016-11-22 NOTE — Assessment & Plan Note (Addendum)
Bp looks good off medications, will have her monitor off the low dose lisinopil  check lipids Check A1C glucose intolerance

## 2016-11-23 LAB — HEMOGLOBIN A1C
Hgb A1c MFr Bld: 7.2 % — ABNORMAL HIGH (ref <5.7)
Mean Plasma Glucose: 160 mg/dL

## 2016-11-23 LAB — PTH, INTACT AND CALCIUM: Calcium: 10.8 mg/dL — ABNORMAL HIGH (ref 8.6–10.4)

## 2016-11-23 LAB — HEPATITIS C ANTIBODY: HCV Ab: NEGATIVE

## 2016-11-23 LAB — VITAMIN D 25 HYDROXY (VIT D DEFICIENCY, FRACTURES): VIT D 25 HYDROXY: 25 ng/mL — AB (ref 30–100)

## 2016-11-24 LAB — PAP THINPREP ASCUS RFLX HPV RFLX TYPE

## 2016-11-28 ENCOUNTER — Other Ambulatory Visit: Payer: Self-pay | Admitting: *Deleted

## 2016-11-28 ENCOUNTER — Encounter: Payer: Self-pay | Admitting: *Deleted

## 2016-11-28 MED ORDER — BLOOD GLUCOSE SYSTEM PAK KIT: PACK | 1 refills | Status: AC

## 2016-11-28 MED ORDER — BLOOD GLUCOSE TEST VI STRP: ORAL_STRIP | 1 refills | Status: DC

## 2016-11-28 MED ORDER — LANCET DEVICES MISC: 1 refills | Status: DC

## 2016-11-28 MED ORDER — METFORMIN HCL 500 MG PO TABS: 500.0000 mg | ORAL_TABLET | Freq: Two times a day (BID) | ORAL | 3 refills | Status: DC

## 2016-11-28 MED ORDER — LANCETS MISC: 1 refills | Status: DC

## 2016-12-05 ENCOUNTER — Telehealth: Payer: Self-pay | Admitting: *Deleted

## 2016-12-05 NOTE — Telephone Encounter (Signed)
Received call from patient.   Reports that metformin is causing severe nausea. States that she has tried taking with and without food with no relief.   MD please advise.

## 2016-12-05 NOTE — Telephone Encounter (Signed)
Decrease to 1/2 tablet BID with food, the nausea should pass - Very common side effect, take with food After 1 week, try the full tablet twice a day

## 2016-12-07 NOTE — Telephone Encounter (Signed)
Call placed to patient and patient made aware.  

## 2016-12-11 ENCOUNTER — Other Ambulatory Visit (HOSPITAL_COMMUNITY): Payer: Self-pay | Admitting: Nurse Practitioner

## 2016-12-11 ENCOUNTER — Ambulatory Visit (HOSPITAL_COMMUNITY)
Admission: RE | Admit: 2016-12-11 | Discharge: 2016-12-11 | Disposition: A | Discharge status: Home / Self Care | Payer: BLUE CROSS/BLUE SHIELD | Source: Ambulatory Visit | Attending: Family Medicine | Admitting: Family Medicine

## 2016-12-11 ENCOUNTER — Ambulatory Visit (HOSPITAL_COMMUNITY)
Admission: RE | Admit: 2016-12-11 | Discharge: 2016-12-11 | Disposition: A | Discharge status: Home / Self Care | Payer: BLUE CROSS/BLUE SHIELD | Source: Ambulatory Visit | Attending: Nurse Practitioner | Admitting: Nurse Practitioner

## 2016-12-11 DIAGNOSIS — M4316 Spondylolisthesis, lumbar region: Secondary | ICD-10-CM | POA: Insufficient documentation

## 2016-12-11 DIAGNOSIS — Z1239 Encounter for other screening for malignant neoplasm of breast: Secondary | ICD-10-CM

## 2016-12-11 DIAGNOSIS — N2889 Other specified disorders of kidney and ureter: Secondary | ICD-10-CM | POA: Insufficient documentation

## 2016-12-11 DIAGNOSIS — M47816 Spondylosis without myelopathy or radiculopathy, lumbar region: Secondary | ICD-10-CM | POA: Diagnosis not present

## 2016-12-11 DIAGNOSIS — Z1231 Encounter for screening mammogram for malignant neoplasm of breast: Secondary | ICD-10-CM | POA: Diagnosis not present

## 2016-12-11 DIAGNOSIS — M47896 Other spondylosis, lumbar region: Secondary | ICD-10-CM | POA: Diagnosis not present

## 2016-12-11 DIAGNOSIS — M545 Low back pain: Secondary | ICD-10-CM | POA: Diagnosis not present

## 2016-12-15 DIAGNOSIS — M47816 Spondylosis without myelopathy or radiculopathy, lumbar region: Secondary | ICD-10-CM | POA: Diagnosis not present

## 2016-12-20 ENCOUNTER — Telehealth: Payer: Self-pay | Admitting: *Deleted

## 2016-12-20 NOTE — Telephone Encounter (Signed)
Received call from patient.   Reports that BP has been elevated since stopping low dose lisinopril. Requested refill of BP med.  Call placed to patient to get BP readings. [place].

## 2016-12-22 NOTE — Telephone Encounter (Signed)
Call placed to patient. LMTRC.  

## 2016-12-25 NOTE — Telephone Encounter (Signed)
Call placed to patient. LMTRC.  

## 2016-12-25 NOTE — Telephone Encounter (Signed)
Multiple calls placed to patient with no answer and no return call.   Message to be closed.  

## 2016-12-26 ENCOUNTER — Ambulatory Visit (INDEPENDENT_AMBULATORY_CARE_PROVIDER_SITE_OTHER): Payer: BLUE CROSS/BLUE SHIELD | Admitting: Family Medicine

## 2016-12-26 VITALS — BP 142/82 | HR 88 | Temp 98.9°F | Resp 16 | Ht 61.0 in | Wt 174.0 lb

## 2016-12-26 DIAGNOSIS — E119 Type 2 diabetes mellitus without complications: Secondary | ICD-10-CM

## 2016-12-26 DIAGNOSIS — E782 Mixed hyperlipidemia: Secondary | ICD-10-CM | POA: Diagnosis not present

## 2016-12-26 DIAGNOSIS — I10 Essential (primary) hypertension: Secondary | ICD-10-CM

## 2016-12-26 MED ORDER — LISINOPRIL 10 MG PO TABS: 10.0000 mg | ORAL_TABLET | Freq: Every day | ORAL | 6 refills | Status: DC

## 2016-12-26 MED ORDER — EMPAGLIFLOZIN 10 MG PO TABS: 10.0000 mg | ORAL_TABLET | Freq: Every day | ORAL | 0 refills | Status: DC

## 2016-12-26 NOTE — Patient Instructions (Addendum)
Fasting blood sugar should be 80-120 2 hours after less than 180  Stop the metformin  Take 1/2 tablet of the lisinopril  Try the jardiance  f/u end of june

## 2016-12-26 NOTE — Progress Notes (Signed)
   Subjective:    Patient ID: Teresa Soto, female    DOB: 24-Aug-1956, 61 y.o.   MRN: [id number]  Patient presents for Follow-up (DM/ HTN)   Patient here for interim follow-up. The last visit she is here for a physical exam she had fasting labs done which resulted in systole elevated blood sugar showed A1c of  7.2%which confirmed diagnosis of diabetes mellitus in the setting of her obesity and hypertension.  She's been followed for the past few years with glucose intolerance .The last visit she asked it to me she not been taking the blood pressure medication her blood pressure cuff she had dizzy spells her blood pressure looked okay at that time however now with diagnosis of diabetes.  - She was started on metformin 500 mg and titrated to twice a day. Of note her triglycerides were also elevated at 320  She's currently on TriCor   Currently on 1/2 tablet ( 250mg ) twice a day of metformin becaseshe  felt bad-  After breakfast 111, will drop down to 63   1 hour after eating 236. States on the lower dose of metformin still makes her feel bad.   Review Of Systems:  GEN- denies fatigue, fever, weight loss,weakness, recent illness HEENT- denies eye drainage, change in vision, nasal discharge, CVS- denies chest pain, palpitations RESP- denies SOB, cough, wheeze ABD- denies N/V, change in stools, abd pain GU- denies dysuria, hematuria, dribbling, incontinence MSK- denies joint pain, muscle aches, injury Neuro- denies headache, dizziness, syncope, seizure activity       Objective:    BP (!) 142/82   Pulse 88   Temp 98.9 F (37.2 C) (Oral)   Resp 16   Ht 5\' 1"  (1.549 m)   Wt 174 lb (78.9 kg)   SpO2 98%   BMI 32.88 kg/m  GEN- NAD, alert and oriented x3 HEENT- PERRL, EOMI, non injected sclera, pink conjunctiva, MMM, oropharynx clear CVS- RRR, no murmur RESP-CTAB        Assessment & Plan:      Problem List Items Addressed This Visit    Hyperlipidemia - Primary   Relevant  Medications   lisinopril (PRINIVIL,ZESTRIL) 10 MG tablet   Essential hypertension    Restart lisinopril at 5 mg once a day see how she does.      Relevant Medications   lisinopril (PRINIVIL,ZESTRIL) 10 MG tablet   Diabetes mellitus, type II ([place])    Again discussed dietary changes. She is not tolerating the metformin. I given her samples of Jardiance to try. She will call with her blood sugars in 2-3 weeks. She will continue her TriCor currently. With changing her diet and her blood sugars her triglycerides should come down      Relevant Medications   lisinopril (PRINIVIL,ZESTRIL) 10 MG tablet   empagliflozin (JARDIANCE) 10 MG TABS tablet      Note: This dictation was prepared with Dragon dictation along with smaller phrase technology. Any transcriptional errors that result from this process are unintentional.

## 2016-12-27 NOTE — Assessment & Plan Note (Signed)
Restart lisinopril at 5 mg once a day see how she does.

## 2016-12-27 NOTE — Assessment & Plan Note (Signed)
Again discussed dietary changes. She is not tolerating the metformin. I given her samples of Jardiance to try. She will call with her blood sugars in 2-3 weeks. She will continue her TriCor currently. With changing her diet and her blood sugars her triglycerides should come down

## 2016-12-28 ENCOUNTER — Encounter: Payer: Self-pay | Admitting: Family Medicine

## 2017-01-09 ENCOUNTER — Other Ambulatory Visit: Payer: Self-pay | Admitting: *Deleted

## 2017-01-09 MED ORDER — EMPAGLIFLOZIN 10 MG PO TABS: 10.0000 mg | ORAL_TABLET | Freq: Every day | ORAL | 1 refills | Status: DC

## 2017-02-05 ENCOUNTER — Encounter: Payer: Self-pay | Admitting: Family Medicine

## 2017-02-06 DIAGNOSIS — Z0289 Encounter for other administrative examinations: Secondary | ICD-10-CM

## 2017-03-20 ENCOUNTER — Other Ambulatory Visit: Payer: Self-pay | Admitting: Family Medicine

## 2017-03-28 ENCOUNTER — Encounter: Payer: Self-pay | Admitting: Family Medicine

## 2017-03-28 ENCOUNTER — Ambulatory Visit (INDEPENDENT_AMBULATORY_CARE_PROVIDER_SITE_OTHER): Payer: BLUE CROSS/BLUE SHIELD | Admitting: Family Medicine

## 2017-03-28 VITALS — BP 138/84 | HR 90 | Temp 98.7°F | Resp 14 | Ht 61.0 in | Wt 172.0 lb

## 2017-03-28 DIAGNOSIS — E782 Mixed hyperlipidemia: Secondary | ICD-10-CM | POA: Diagnosis not present

## 2017-03-28 DIAGNOSIS — M25552 Pain in left hip: Secondary | ICD-10-CM

## 2017-03-28 DIAGNOSIS — I10 Essential (primary) hypertension: Secondary | ICD-10-CM | POA: Diagnosis not present

## 2017-03-28 DIAGNOSIS — M1612 Unilateral primary osteoarthritis, left hip: Secondary | ICD-10-CM | POA: Diagnosis not present

## 2017-03-28 DIAGNOSIS — E119 Type 2 diabetes mellitus without complications: Secondary | ICD-10-CM

## 2017-03-28 LAB — LIPID PANEL
Cholesterol: 225 mg/dL — ABNORMAL HIGH (ref <200)
HDL: 37 mg/dL — ABNORMAL LOW (ref >50)
LDL CALC: 141 mg/dL — AB (ref <100)
Total CHOL/HDL Ratio: 6.1 Ratio — ABNORMAL HIGH (ref <5.0)
Triglycerides: 233 mg/dL — ABNORMAL HIGH (ref <150)
VLDL: 47 mg/dL — ABNORMAL HIGH (ref <30)

## 2017-03-28 LAB — CBC WITH DIFFERENTIAL/PLATELET
BASOS ABS: 0 {cells}/uL (ref 0–200)
BASOS PCT: 0 %
EOS PCT: 3 %
Eosinophils Absolute: 207 cells/uL (ref 15–500)
HCT: 42.7 % (ref 35.0–45.0)
HEMOGLOBIN: 13.8 g/dL (ref 12.0–15.0)
LYMPHS ABS: 2139 {cells}/uL (ref 850–3900)
Lymphocytes Relative: 31 %
MCH: 27.8 pg (ref 27.0–33.0)
MCHC: 32.3 g/dL (ref 32.0–36.0)
MCV: 85.9 fL (ref 80.0–100.0)
MPV: 9.2 fL (ref 7.5–12.5)
Monocytes Absolute: 483 cells/uL (ref 200–950)
Monocytes Relative: 7 %
NEUTROS ABS: 4071 {cells}/uL (ref 1500–7800)
Neutrophils Relative %: 59 %
Platelets: 312 10*3/uL (ref 140–400)
RBC: 4.97 MIL/uL (ref 3.80–5.10)
RDW: 15.2 % — ABNORMAL HIGH (ref 11.0–15.0)
WBC: 6.9 10*3/uL (ref 3.8–10.8)

## 2017-03-28 LAB — COMPREHENSIVE METABOLIC PANEL
ALBUMIN: 4.2 g/dL (ref 3.6–5.1)
ALK PHOS: 46 U/L (ref 33–130)
ALT: 13 U/L (ref 6–29)
AST: 14 U/L (ref 10–35)
BILIRUBIN TOTAL: 0.4 mg/dL (ref 0.2–1.2)
BUN: 23 mg/dL (ref 7–25)
CO2: 22 mmol/L (ref 20–31)
CREATININE: 1.14 mg/dL — AB (ref 0.50–1.05)
Calcium: 10.5 mg/dL — ABNORMAL HIGH (ref 8.6–10.4)
Chloride: 102 mmol/L (ref 98–110)
Glucose, Bld: 87 mg/dL (ref 70–99)
Potassium: 4.5 mmol/L (ref 3.5–5.3)
Sodium: 137 mmol/L (ref 135–146)
TOTAL PROTEIN: 7 g/dL (ref 6.1–8.1)

## 2017-03-28 NOTE — Assessment & Plan Note (Signed)
Bp looks good, no change to meds

## 2017-03-28 NOTE — Assessment & Plan Note (Signed)
Goal A1C < 7% with history of stroke prefer around 6.5% Discussed diet which she is having some difficulty with  Urine micro, foot exam to day

## 2017-03-28 NOTE — Patient Instructions (Signed)
F/U 4 months  

## 2017-03-28 NOTE — Assessment & Plan Note (Addendum)
Check lipids on tricor , she has tried multiple statins had SE

## 2017-03-28 NOTE — Progress Notes (Signed)
   Subjective:    Patient ID: Teresa Soto, female    DOB: [birth year], 61 y.o.   MRN: [id number]  Patient presents for Follow-up (is not fasting) and L Hip Pain (x months- states that she does have sciatic pain, but this feels different)   She had a follow-up chronic medical problems. She is diagnosed with diabetes mellitus in Feb 2017  she is currently on metformin 500 mg twice a day well as Jardiance 10mg  daily.  Last A1C 7.2% , CBG this AM 93, forgot her meter and notebook, 147 one morning    Hypertension- taking BP medication as prescribed , taking lisinopril    Left hip pain pain on and off , she follows with orthopedics for her chronic back pain as well, told she had scoliosis at visit in March. Worse with sitting for periods of time, feels different from her DDD and sciatica, would ;like to go back to her orthopedist, needs a referral      Review Of Systems:  GEN- denies fatigue, fever, weight loss,weakness, recent illness HEENT- denies eye drainage, change in vision, nasal discharge, CVS- denies chest pain, palpitations RESP- denies SOB, cough, wheeze ABD- denies N/V, change in stools, abd pain GU- denies dysuria, hematuria, dribbling, incontinence MSK- denies joint pain, muscle aches, injury Neuro- denies headache, dizziness, syncope, seizure activity       Objective:    BP 138/84   Pulse 90   Temp 98.7 F (37.1 C) (Oral)   Resp 14   Ht 5\' 1"  (1.549 m)   Wt 172 lb (78 kg)   SpO2 99%   BMI 32.50 kg/m  GEN- NAD, alert and oriented x3 HEENT- PERRL, EOMI, non injected sclera, pink conjunctiva, MMM, oropharynx clear Neck- Supple, no thyromegaly CVS- RRR, no murmur RESP-CTAB MSK- mild TTP over left hip, fair ROM, pain with IR, neg SLR left side, fair ROM bilat knees EXT- No edema Pulses- Radial, DP- 2+        Assessment & Plan:      Problem List Items Addressed This Visit    Hyperlipidemia - Primary    Check lipids on tricor , she has tried multiple  statins had SE      Relevant Orders   Lipid panel   Essential hypertension    Bp looks good, no change to meds      Diabetes mellitus, type II ([facility])    Goal A1C < 7% with history of stroke prefer around 6.5% Discussed diet which she is having some difficulty with  Urine micro, foot exam to day       Relevant Orders   CBC with Differential/Platelet   Comprehensive metabolic panel   Hemoglobin A1c   Microalbumin / creatinine urine ratio   HM DIABETES FOOT EXAM (Completed)    Other Visit Diagnoses    Left hip pain       Relevant Orders   Ambulatory referral to Orthopedic Surgery   Primary osteoarthritis of left hip       Relevant Orders   Ambulatory referral to Orthopedic Surgery      Note: This dictation was prepared with Dragon dictation along with smaller phrase technology. Any transcriptional errors that result from this process are unintentional.

## 2017-03-29 LAB — MICROALBUMIN / CREATININE URINE RATIO
Creatinine, Urine: 60 mg/dL (ref 20–320)
MICROALB UR: 1.8 mg/dL
Microalb Creat Ratio: 30 mcg/mg creat — ABNORMAL HIGH (ref <30)

## 2017-03-29 LAB — HEMOGLOBIN A1C
HEMOGLOBIN A1C: 6 % — AB (ref <5.7)
MEAN PLASMA GLUCOSE: 126 mg/dL

## 2017-04-02 ENCOUNTER — Ambulatory Visit (INDEPENDENT_AMBULATORY_CARE_PROVIDER_SITE_OTHER): Payer: BLUE CROSS/BLUE SHIELD | Admitting: Otolaryngology

## 2017-04-05 ENCOUNTER — Other Ambulatory Visit: Payer: Self-pay | Admitting: *Deleted

## 2017-04-05 MED ORDER — FENOFIBRATE 145 MG PO TABS: 145.0000 mg | ORAL_TABLET | Freq: Every day | ORAL | 6 refills | Status: DC

## 2017-04-23 ENCOUNTER — Ambulatory Visit (INDEPENDENT_AMBULATORY_CARE_PROVIDER_SITE_OTHER): Payer: BLUE CROSS/BLUE SHIELD

## 2017-04-23 ENCOUNTER — Ambulatory Visit (INDEPENDENT_AMBULATORY_CARE_PROVIDER_SITE_OTHER): Payer: BLUE CROSS/BLUE SHIELD | Admitting: Orthopedic Surgery

## 2017-04-23 ENCOUNTER — Encounter: Payer: Self-pay | Admitting: Orthopedic Surgery

## 2017-04-23 VITALS — BP 127/79 | HR 91 | Temp 98.2°F | Ht 62.0 in | Wt 173.0 lb

## 2017-04-23 DIAGNOSIS — M25552 Pain in left hip: Secondary | ICD-10-CM

## 2017-04-23 NOTE — Progress Notes (Signed)
Established patient new problem  Chief Complaint  Patient presents with  . Hip Pain    LEFT HIP PAIN     61 year old female with history of lumbar spondylosis and anterolisthesis, she presents with new onset pain in her left hip near the iliac crest and lateral side of the hip nonradiating and not involving the groin. It's a dull aching pain. This is been present for approximately a month   Review of Systems  Constitutional: Negative for chills, fever and weight loss.  Respiratory: Negative for shortness of breath.   Cardiovascular: Negative for chest pain.  Musculoskeletal: Positive for back pain.  Neurological: Negative for tingling.   Past Medical History:  Diagnosis Date  . Arthritis   . 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 Asthma   . Chronic back pain   . Chronic constipation   . Depression   . Heart murmur    in childhood  . History of bronchitis   . History of urinary tract infection   . Hypertension   . Imbalance   . Meniere disease   . Neuropathic pain   . Parathyroid adenoma   . Poor fine motor skills    secondary to CVA per right side   . Pre-diabetes   . PTSD (post-traumatic stress disorder)   . Restless legs syndrome 2007 approx  . Seasonal allergies   . Shortness of breath dyspnea    coldness   . Stroke ([place])   . Vertigo     BP 127/79   Pulse 91   Temp 98.2 F (36.8 C)   Ht 5\' 2"  (1.575 m)   Wt 173 lb (78.5 kg)   BMI 31.64 kg/m   She is well-groomed and has normal hygiene, she is oriented 3. Her mood is pleasant her affect is normal. She walks normally  She has some tenderness in her lumbar spine iliac crest left buttock but no significant tenderness over the greater trochanter.  Bilateral hip range of motion is full including internal/external rotation without groin pain in both hips remain stable There is no atrophy or motor weakness in the hip flexors her abductors Skin over both legs normal Distal pulses over both lower extremities  intact  X-ray pelvis and hip normal  Lumbar spine film from 2018 March show a very straight lumbar spine with spondylosis in the lower facet joints  Encounter Diagnosis  Name Primary?  . Left hip pain Yes    No further treatment recommended

## 2017-05-23 ENCOUNTER — Ambulatory Visit (INDEPENDENT_AMBULATORY_CARE_PROVIDER_SITE_OTHER): Payer: BLUE CROSS/BLUE SHIELD | Admitting: Family Medicine

## 2017-05-23 ENCOUNTER — Ambulatory Visit: Payer: BLUE CROSS/BLUE SHIELD | Admitting: Family Medicine

## 2017-05-23 ENCOUNTER — Encounter: Payer: Self-pay | Admitting: Family Medicine

## 2017-05-23 VITALS — BP 128/78 | HR 82 | Temp 98.5°F | Resp 14 | Ht 62.0 in | Wt 174.0 lb

## 2017-05-23 DIAGNOSIS — E782 Mixed hyperlipidemia: Secondary | ICD-10-CM | POA: Diagnosis not present

## 2017-05-23 DIAGNOSIS — Z8673 Personal history of transient ischemic attack (TIA), and cerebral infarction without residual deficits: Secondary | ICD-10-CM

## 2017-05-23 DIAGNOSIS — E119 Type 2 diabetes mellitus without complications: Secondary | ICD-10-CM | POA: Diagnosis not present

## 2017-05-23 DIAGNOSIS — R413 Other amnesia: Secondary | ICD-10-CM

## 2017-05-23 DIAGNOSIS — Z818 Family history of other mental and behavioral disorders: Secondary | ICD-10-CM | POA: Diagnosis not present

## 2017-05-23 DIAGNOSIS — R58 Hemorrhage, not elsewhere classified: Secondary | ICD-10-CM | POA: Diagnosis not present

## 2017-05-23 LAB — CBC WITH DIFFERENTIAL/PLATELET
BASOS ABS: 60 {cells}/uL (ref 0–200)
Basophils Relative: 1 %
EOS ABS: 180 {cells}/uL (ref 15–500)
Eosinophils Relative: 3 %
HCT: 42.7 % (ref 35.0–45.0)
Hemoglobin: 13.8 g/dL (ref 12.0–15.0)
Lymphocytes Relative: 33 %
Lymphs Abs: 1980 cells/uL (ref 850–3900)
MCH: 28.3 pg (ref 27.0–33.0)
MCHC: 32.3 g/dL (ref 32.0–36.0)
MCV: 87.5 fL (ref 80.0–100.0)
MONOS PCT: 9 %
MPV: 9.2 fL (ref 7.5–12.5)
Monocytes Absolute: 540 cells/uL (ref 200–950)
NEUTROS ABS: 3240 {cells}/uL (ref 1500–7800)
Neutrophils Relative %: 54 %
PLATELETS: 340 10*3/uL (ref 140–400)
RBC: 4.88 MIL/uL (ref 3.80–5.10)
RDW: 14.6 % (ref 11.0–15.0)
WBC: 6 10*3/uL (ref 3.8–10.8)

## 2017-05-23 LAB — GLUCOSE, FINGERSTICK (STAT): Glucose, fingerstick: 77 mg/dL (ref 65–99)

## 2017-05-23 NOTE — Progress Notes (Signed)
   Subjective:    Patient ID: Teresa Soto, female    DOB: 1956-07-21, 61 y.o.   MRN: [id number]  Patient presents for Discuss Issues with DM (discuss increased bleeding with checking CBG, dietary restrictions)  Pt here to discuss her diabetes- she is currently on Jardiance 10mg  once a day , lisinopril for BP and renal protection, Tricor , did not tolerate Metformin, does not tolerate statins   Her Last A1C was 6% showing great control, Cholesterol was still elevated TG 233, LDL 141, tricor was increased to 145mg  , she does not like checking her sugar because she free bleeds and it doesn't stop on ASA, naprosyn, does not want to stop naprosyn due to her joint pain, no bllood in stool, epistaxis, hematuria Also ongoinbg difficulty with diet and what to eat for cholesterol and diabetes and low NA due to her menieres disease   History of stroke , memory is worsening, family concerned, has some parkinsons in family as well, would like to have evaluated      Review Of Systems:  GEN- denies fatigue, fever, weight loss,weakness, recent illness HEENT- denies eye drainage, change in vision, nasal discharge, CVS- denies chest pain, palpitations RESP- denies SOB, cough, wheeze ABD- denies N/V, change in stools, abd pain GU- denies dysuria, hematuria, dribbling, incontinence MSK-+joint pain, muscle aches, injury Neuro- denies headache, dizziness, syncope, seizure activity       Objective:    BP 128/78   Pulse 82   Temp 98.5 F (36.9 C) (Oral)   Resp 14   Ht 5\' 2"  (1.575 m)   Wt 174 lb (78.9 kg)   SpO2 97%   BMI 31.83 kg/m  GEN- NAD, alert and oriented x3 Neuro- CNII-XII grossly in tact, recall in tact  Skin- in tact no bruising  Pulses- Radial  2+        Assessment & Plan:      Problem List Items Addressed This Visit    Hyperlipidemia   Diabetes mellitus, type II ([place]) - Primary    CBG okay in office, has not eaten Continue jardiance, send to dietician to help with her  restrictions We have discussed multiple times She has to continue ASA, history of stroke, advised can decrease her NSAID, check CBG Three times a week and if feeling ill      Relevant Orders   Glucose, fingerstick (stat) (Completed)    Other Visit Diagnoses    Memory changes       Referral to neurology, family history of dementia/parkinsons, though likely result of stroke and vascular changes   Relevant Orders   Ambulatory referral to Neurology   History of stroke       Relevant Orders   Ambulatory referral to Neurology   Family history of dementia       Relevant Orders   Ambulatory referral to Neurology   Bleeding       No abnormal bruising/bleeding, plt normal CBC/INR normal   Relevant Orders   CBC with Differential/Platelet (Completed)   Protime-INR (Completed)      Note: This dictation was prepared with Dragon dictation along with smaller phrase technology. Any transcriptional errors that result from this process are unintentional.

## 2017-05-23 NOTE — Patient Instructions (Addendum)
Referral to Neurology for your memory  Check blood sugar three times a week  Referral to nutritionist  F/U as previous

## 2017-05-24 ENCOUNTER — Encounter: Payer: Self-pay | Admitting: Family Medicine

## 2017-05-24 LAB — PROTIME-INR
INR: 1
Prothrombin Time: 10.5 s (ref 9.0–11.5)

## 2017-05-24 NOTE — Assessment & Plan Note (Signed)
CBG okay in office, has not eaten Continue jardiance, send to dietician to help with her restrictions We have discussed multiple times She has to continue ASA, history of stroke, advised can decrease her NSAID, check CBG Three times a week and if feeling ill

## 2017-05-26 ENCOUNTER — Other Ambulatory Visit: Payer: Self-pay | Admitting: Family Medicine

## 2017-06-12 DIAGNOSIS — I69398 Other sequelae of cerebral infarction: Secondary | ICD-10-CM | POA: Diagnosis not present

## 2017-06-12 DIAGNOSIS — E782 Mixed hyperlipidemia: Secondary | ICD-10-CM | POA: Diagnosis not present

## 2017-06-12 DIAGNOSIS — G3184 Mild cognitive impairment, so stated: Secondary | ICD-10-CM | POA: Diagnosis not present

## 2017-06-12 DIAGNOSIS — I10 Essential (primary) hypertension: Secondary | ICD-10-CM | POA: Diagnosis not present

## 2017-07-10 LAB — HM DIABETES EYE EXAM

## 2017-07-12 ENCOUNTER — Encounter: Payer: BLUE CROSS/BLUE SHIELD | Attending: Family Medicine | Admitting: Nutrition

## 2017-07-12 VITALS — Ht 61.0 in | Wt 171.8 lb

## 2017-07-12 DIAGNOSIS — I10 Essential (primary) hypertension: Secondary | ICD-10-CM | POA: Diagnosis not present

## 2017-07-12 DIAGNOSIS — E782 Mixed hyperlipidemia: Secondary | ICD-10-CM | POA: Diagnosis not present

## 2017-07-12 DIAGNOSIS — Z7984 Long term (current) use of oral hypoglycemic drugs: Secondary | ICD-10-CM | POA: Diagnosis not present

## 2017-07-12 DIAGNOSIS — E669 Obesity, unspecified: Secondary | ICD-10-CM

## 2017-07-12 DIAGNOSIS — E119 Type 2 diabetes mellitus without complications: Secondary | ICD-10-CM | POA: Diagnosis not present

## 2017-07-12 NOTE — Patient Instructions (Signed)
Goals 1. Follow My Plate 2. Eat 2-3 carb choices per meals 3. Cut out tea with splenda and use only unsweet tea or plain water 4. Exercise 30 minutes 4-5 times per week 5. Test blood sugars in am and before bed a few times per week  Goal am 80-120 mg/dl and bedtime 100-150 mg/dl Lose 1 lb per week Increase high fiber foods to 25-30 grams per day.

## 2017-07-12 NOTE — Progress Notes (Signed)
  Medical Nutrition Therapy:  Appt start time: 1330 end time:  1430.   Assessment:  Primary concerns today: Diabetes Type 2. PMH: Hyperlipidemia, HTN and had a Stoke about 4 years ago.   Diagnosed with DM Type 2  this past year. HIghest A1C 7.4%.  Now 6%.  LIves with husband. She shops and cooks. Foods are  poached, baked, grilled. Eats three meals per day. Denies snacking but admits to larger portions. She is testing blood sugars three times per week.Teresa Soto daily. FBS:105-130's. Her goal is to keep it less than 120 mg in am. Not testing at night. Didn't tolerated Metformin in the past.. She notes she reads food labels excessively. Follows a low salt diet due to her vertigo. Diet is higher in sodium from canned meats and low in fresh fruits, vegetables and whole grains/fiber rich foods. A1C 6%.  She is willing to work on increasing fiber, whole grains and eating better balanced meals to improve blood sugars and lose weight. Goal weight 140-150 lbs.  Preferred Learning Style: Read  No preference indicated   Learning Readiness:  Ready  Change in progress   MEDICATIONS:   DIETARY INTAKE: 24-hr recall:  B ( AM):  Canned beef, with mushroom and onions sauce, peas,  Snk ( AM): L ( PM): same for breakfast Snk ( PM): D ( PM):Tuna fish sandwich on 2 slices toast Snk ( PM):  Beverages: water or sweet tea with splenda  Usual physical activity: ADL  Estimated energy needs: 1200  calories 135 g carbohydrates 90 g protein 33 g fat  Progress Towards Goal(s):  In progress.   Nutritional Diagnosis:  NB-1.1 Food and nutrition-related knowledge deficit As related to Diabetes.  As evidenced by A1C 6.0% but was 7.4%..    Intervention:  Nutrition and Diabetes education provided on My Plate, CHO counting, meal planning, portion sizes, timing of meals, avoiding snacks between meals unless having a low blood sugar, target ranges for A1C and blood sugars, signs/symptoms and treatment of  hyper/hypoglycemia, monitoring blood sugars, taking medications as prescribed, benefits of exercising 30 minutes per day and prevention of complications of DM.  Marland KitchenGoals 1. Follow My Plate 2. Eat 2-3 carb choices per meals 3. Cut out tea with splenda and use only unsweet tea or plain water 4. Exercise 30 minutes 4-5 times per week 5. Test blood sugars in am and before bed a few times per week  Goal am 80-120 mg/dl and bedtime 100-150 mg/dl Lose 1 lb per week Increase high fiber foods to 25-30 grams per day.  Teaching Method Utilized: Visual Auditory Hands on  Handouts given during visit include:  The Plate Method   Meal Plan Card  Diabetes Instructions   Barriers to learning/adherence to lifestyle change: none  Demonstrated degree of understanding via:  Teach Back   Monitoring/Evaluation:  Dietary intake, exercise, meal planning, and body weight in 2 month(s).

## 2017-07-17 ENCOUNTER — Encounter: Payer: Self-pay | Admitting: Family Medicine

## 2017-07-30 ENCOUNTER — Other Ambulatory Visit: Payer: Self-pay | Admitting: Family Medicine

## 2017-07-30 ENCOUNTER — Ambulatory Visit: Payer: BLUE CROSS/BLUE SHIELD | Admitting: Family Medicine

## 2017-08-03 ENCOUNTER — Ambulatory Visit: Payer: BLUE CROSS/BLUE SHIELD | Admitting: Family Medicine

## 2017-08-08 ENCOUNTER — Ambulatory Visit: Payer: BLUE CROSS/BLUE SHIELD | Admitting: Family Medicine

## 2017-08-19 ENCOUNTER — Other Ambulatory Visit: Payer: Self-pay | Admitting: Family Medicine

## 2017-09-17 ENCOUNTER — Ambulatory Visit: Payer: BLUE CROSS/BLUE SHIELD | Admitting: Nutrition

## 2017-09-26 ENCOUNTER — Encounter: Payer: BLUE CROSS/BLUE SHIELD | Attending: Family Medicine | Admitting: Nutrition

## 2017-09-26 VITALS — Wt 178.0 lb

## 2017-09-26 DIAGNOSIS — E1165 Type 2 diabetes mellitus with hyperglycemia: Secondary | ICD-10-CM

## 2017-09-26 DIAGNOSIS — E118 Type 2 diabetes mellitus with unspecified complications: Principal | ICD-10-CM

## 2017-09-26 DIAGNOSIS — E669 Obesity, unspecified: Secondary | ICD-10-CM

## 2017-09-26 DIAGNOSIS — IMO0002 Reserved for concepts with insufficient information to code with codable children: Secondary | ICD-10-CM

## 2017-09-26 NOTE — Progress Notes (Signed)
  Medical Nutrition Therapy:  Appt start time: 1330 end time:  1400.  Assessment:  Primary concerns today: Diabetes Type 2. PMH: Hyperlipidemia, HTN and had a Stoke about 4 years ago.   Diagnosed with DM Type 2  this past year. HIghest A1C 7.4%.  Now 6%.     Has been walking more.  FBS this 107 mg/dl. Eating more fresh fruits, vegetables, cutting out snacks and drinking water. However, is still eating more calories from higher fat foods; sausage biscuits, fried meats. Gained 6 lbs due to excess calories coming from fats. She is willing to work on increasing fiber, whole grains and eating better balanced meals to improve blood sugars and lose weight. Goal weight 140-150 lbs.  Preferred Learning Style: Read  No preference indicated   Learning Readiness:  Ready  Change in progress   MEDICATIONS:   DIETARY INTAKE: 24-hr recall:  B ( AM): Sausage biscuit 2, Snk ( AM): L ( PM): Fish, sauted, succatash Snk ( PM): D ( PM):Tuna fish sandwich on 2 slices toast Snk ( PM):  Beverages: water or sweet tea with splenda  Usual physical activity: ADL  Estimated energy needs: 1200  calories 135 g carbohydrates 90 g protein 33 g fat  Progress Towards Goal(s):  In progress.   Nutritional Diagnosis:  NB-1.1 Food and nutrition-related knowledge deficit As related to Diabetes.  As evidenced by A1C 6.0% but was 7.4%..    Intervention:  Nutrition and Diabetes education provided on My Plate, CHO counting, meal planning, portion sizes, timing of meals, avoiding snacks between meals unless having a low blood sugar, target ranges for A1C and blood sugars, signs/symptoms and treatment of hyper/hypoglycemia, monitoring blood sugars, taking medications as prescribed, benefits of exercising 30 minutes per day and prevention of complications of DM.   Goals 1. Cut out high fat high salt foods; bacon, sausage and biscuits 2. Increase fiber in diet 3. Choose egg, oatmeal and yogurt 4. Exercise 150  mins a week Lose 1 lb per week Make appt Dr. Buelah Manis to recheck A1C ang TG levels.  Teaching Method Utilized: Visual Auditory Hands on  Handouts given during visit include:  The Plate Method   Meal Plan Card  Diabetes Instructions   Barriers to learning/adherence to lifestyle change: none  Demonstrated degree of understanding via:  Teach Back   Monitoring/Evaluation:  Dietary intake, exercise, meal planning, and body weight in 2 month(s).

## 2017-09-26 NOTE — Patient Instructions (Addendum)
Goals 1. Cut out high fat high salt foods; bacon, sausage and biscuits 2. Increase fiber in diet 3. Choose egg, oatmeal and yogurt 4. Exercise 150 mins a week Lose 1 lb per week Make appt Dr. Buelah Manis to recheck A1C ang TG levels.

## 2017-09-27 ENCOUNTER — Encounter: Payer: Self-pay | Admitting: Nutrition

## 2017-10-01 ENCOUNTER — Other Ambulatory Visit: Payer: Self-pay | Admitting: Family Medicine

## 2017-10-08 ENCOUNTER — Encounter: Payer: Self-pay | Admitting: Family Medicine

## 2017-10-08 ENCOUNTER — Other Ambulatory Visit: Payer: Self-pay

## 2017-10-08 ENCOUNTER — Ambulatory Visit (INDEPENDENT_AMBULATORY_CARE_PROVIDER_SITE_OTHER): Payer: BLUE CROSS/BLUE SHIELD | Admitting: Family Medicine

## 2017-10-08 VITALS — BP 132/64 | HR 88 | Temp 98.5°F | Resp 16 | Ht 62.0 in | Wt 177.0 lb

## 2017-10-08 DIAGNOSIS — E119 Type 2 diabetes mellitus without complications: Secondary | ICD-10-CM | POA: Diagnosis not present

## 2017-10-08 DIAGNOSIS — Z6832 Body mass index (BMI) 32.0-32.9, adult: Secondary | ICD-10-CM

## 2017-10-08 DIAGNOSIS — I10 Essential (primary) hypertension: Secondary | ICD-10-CM

## 2017-10-08 DIAGNOSIS — E6609 Other obesity due to excess calories: Secondary | ICD-10-CM

## 2017-10-08 DIAGNOSIS — E782 Mixed hyperlipidemia: Secondary | ICD-10-CM | POA: Diagnosis not present

## 2017-10-08 NOTE — Patient Instructions (Signed)
F/U for fasting labs  F/U 4 months for OV

## 2017-10-08 NOTE — Assessment & Plan Note (Signed)
Blood pressure is controlled no change in medication. 

## 2017-10-08 NOTE — Progress Notes (Signed)
    Subjective:    Patient ID: Teresa Soto, female    DOB: 1955/11/08, 61 y.o.   MRN: [id number]  Patient presents for Follow-up (is not fasting)  Pt here to f/u chronic medical problems    DM- last A1C 6% , did not bring her meter she is taking her Jardiance 10mg   weight continues to go up another 4lbs   That she is quite frustrated between what I am telling her in the nutritionist.  She continues to gain weight.  States that portions are her problem. Review the last nutrition note her recall included to sausage biscuits egg sandwich or salad sweetened beverages.   Hyperlipidemia- taking Tricor, does not tolerate statin   For repeat fasting labs.               Review Of Systems:  GEN- denies fatigue, fever, weight loss,weakness, recent illness HEENT- denies eye drainage, change in vision, nasal discharge, CVS- denies chest pain, palpitations RESP- denies SOB, cough, wheeze ABD- denies N/V, change in stools, abd pain GU- denies dysuria, hematuria, dribbling, incontinence MSK- + joint pain, muscle aches, injury Neuro- denies headache, dizziness, syncope, seizure activity       Objective:    BP 132/64   Pulse 88   Temp 98.5 F (36.9 C) (Oral)   Resp 16   Ht 5\' 2"  (1.575 m)   Wt 177 lb (80.3 kg)   SpO2 96%   BMI 32.37 kg/m  GEN- NAD, alert and oriented x3 HEENT- PERRL, EOMI, non injected sclera, pink conjunctiva, MMM, oropharynx clear CVS- RRR, no murmur RESP-CTAB EXT- No edema Pulses- Radial, DP- 2+        Assessment & Plan:      Problem List Items Addressed This Visit      Unprioritized   Obesity   Hyperlipidemia   Relevant Orders   Lipid panel   Essential hypertension - Primary    Blood pressure is controlled no change in medication.      Relevant Orders   Comprehensive metabolic panel   Diabetes mellitus, type II ([place])    Her diabetes has been well controlled but she does continue to gain weight and continues to have difficulties with  proper nutrition.  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  Discussed going to the [facility] doing water aerobics.  She will return for fasting labs.      Relevant Orders   Hemoglobin A1c   CBC with Differential/Platelet      Note: This dictation was prepared with Dragon dictation along with smaller phrase technology. Any transcriptional errors that result from this process are unintentional.

## 2017-10-08 NOTE — Assessment & Plan Note (Signed)
Her diabetes has been well controlled but she does continue to gain weight and continues to have difficulties with proper nutrition.  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  Discussed going to the [facility] doing water aerobics.  She will return for fasting labs.

## 2017-10-10 ENCOUNTER — Other Ambulatory Visit: Payer: BLUE CROSS/BLUE SHIELD

## 2017-10-22 ENCOUNTER — Ambulatory Visit (INDEPENDENT_AMBULATORY_CARE_PROVIDER_SITE_OTHER): Payer: BLUE CROSS/BLUE SHIELD | Admitting: Otolaryngology

## 2017-11-05 ENCOUNTER — Other Ambulatory Visit: Payer: Self-pay | Admitting: Family Medicine

## 2017-11-05 ENCOUNTER — Ambulatory Visit (INDEPENDENT_AMBULATORY_CARE_PROVIDER_SITE_OTHER): Payer: BLUE CROSS/BLUE SHIELD | Admitting: Otolaryngology

## 2017-11-05 DIAGNOSIS — H9041 Sensorineural hearing loss, unilateral, right ear, 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: Secondary | ICD-10-CM

## 2017-11-05 DIAGNOSIS — H8101 Meniere's disease, right ear: Secondary | ICD-10-CM

## 2017-11-26 ENCOUNTER — Other Ambulatory Visit: Payer: Self-pay | Admitting: Family Medicine

## 2017-12-05 DIAGNOSIS — E782 Mixed hyperlipidemia: Secondary | ICD-10-CM | POA: Diagnosis not present

## 2017-12-05 DIAGNOSIS — G3184 Mild cognitive impairment, so stated: Secondary | ICD-10-CM | POA: Diagnosis not present

## 2017-12-05 DIAGNOSIS — I69311 Memory deficit following cerebral infarction: Secondary | ICD-10-CM | POA: Diagnosis not present

## 2017-12-05 DIAGNOSIS — Z79899 Other long term (current) drug therapy: Secondary | ICD-10-CM | POA: Diagnosis not present

## 2017-12-05 DIAGNOSIS — E213 Hyperparathyroidism, unspecified: Secondary | ICD-10-CM | POA: Diagnosis not present

## 2017-12-05 DIAGNOSIS — I10 Essential (primary) hypertension: Secondary | ICD-10-CM | POA: Diagnosis not present

## 2017-12-06 ENCOUNTER — Other Ambulatory Visit: Payer: Self-pay | Admitting: Family Medicine

## 2017-12-12 ENCOUNTER — Other Ambulatory Visit: Payer: Self-pay | Admitting: Family Medicine

## 2017-12-19 ENCOUNTER — Telehealth: Payer: Self-pay | Admitting: *Deleted

## 2017-12-19 NOTE — Telephone Encounter (Signed)
Received fax from Dr. Freddie Apley office with patient lab results.   MD reviewed and recommendations are as follows: Kidney function worsening- make sure to stay hydrated Repeat at next visit.   Call placed to patient and patient made aware.

## 2017-12-24 ENCOUNTER — Other Ambulatory Visit: Payer: Self-pay | Admitting: Family Medicine

## 2017-12-24 ENCOUNTER — Other Ambulatory Visit: Payer: Self-pay

## 2017-12-24 ENCOUNTER — Ambulatory Visit (INDEPENDENT_AMBULATORY_CARE_PROVIDER_SITE_OTHER): Payer: BLUE CROSS/BLUE SHIELD | Admitting: Family Medicine

## 2017-12-24 ENCOUNTER — Encounter: Payer: Self-pay | Admitting: Family Medicine

## 2017-12-24 VITALS — BP 128/72 | HR 78 | Temp 98.5°F | Resp 16 | Ht 62.0 in | Wt 177.0 lb

## 2017-12-24 DIAGNOSIS — R21 Rash and other nonspecific skin eruption: Secondary | ICD-10-CM

## 2017-12-24 DIAGNOSIS — B360 Pityriasis versicolor: Secondary | ICD-10-CM | POA: Diagnosis not present

## 2017-12-24 MED ORDER — GABAPENTIN 300 MG PO CAPS: ORAL_CAPSULE | ORAL | 6 refills | Status: DC

## 2017-12-24 NOTE — Patient Instructions (Addendum)
F/U as previous  Referral to dermatology

## 2017-12-24 NOTE — Progress Notes (Signed)
   Subjective:    Patient ID: Teresa Soto, female    DOB: 1956-03-30, 62 y.o.   MRN: [id number]  Patient presents for Rash (x6 months- round irritation to B Upper arms- reports that areas itch and sometimes grow)   Pt here with rash on bilat upper arms for past 6 months or so she has the scaly red spots to come up they do itch some she has a few on bilateral arms.  They have not spread to her trunk or her legs.   Tinea versicolor to break out across her chest her back lower abdomen.  She has used Diflucan in the past as well as other antifungals.   Gabapentin increased to 600mg  at bedtime for her neuropathy      Review Of Systems:  GEN- denies fatigue, fever, weight loss,weakness, recent illness HEENT- denies eye drainage, change in vision, nasal discharge, CVS- denies chest pain, palpitations RESP- denies SOB, cough, wheeze ABD- denies N/V, change in stools, abd pain GU- denies dysuria, hematuria, dribbling, incontinence MSK- denies joint pain, muscle aches, injury Neuro- denies headache, dizziness, syncope, seizure activity       Objective:    BP 128/72   Pulse 78   Temp 98.5 F (36.9 C) (Oral)   Resp 16   Ht 5\' 2"  (1.575 m)   Wt 177 lb (80.3 kg)   SpO2 96%   BMI 32.37 kg/m  GEN- NAD, alert and oriented x3 Skin- tinea versicolor on chest, back, abdomen, 4 small erythematous scaley lesions on bilat forearm, 2 on upper left arm, NT         Assessment & Plan:      Problem List Items Addressed This Visit      Unprioritized   Tinea versicolor - Primary    Other Visit Diagnoses    Rash and nonspecific skin eruption       Seems to be some type of dermatitis, recommend biopsy, would prefer dermatology with her other skin lesions      Note: This dictation was prepared with Dragon dictation along with smaller phrase technology. Any transcriptional errors that result from this process are unintentional.

## 2017-12-26 ENCOUNTER — Ambulatory Visit: Payer: BLUE CROSS/BLUE SHIELD | Admitting: Nutrition

## 2018-01-02 ENCOUNTER — Emergency Department (HOSPITAL_COMMUNITY)
Admission: EM | Admit: 2018-01-02 | Discharge: 2018-01-02 | Disposition: A | Discharge status: Home / Self Care | Payer: BLUE CROSS/BLUE SHIELD | Attending: Emergency Medicine | Admitting: Emergency Medicine

## 2018-01-02 ENCOUNTER — Emergency Department (HOSPITAL_COMMUNITY): Payer: BLUE CROSS/BLUE SHIELD

## 2018-01-02 ENCOUNTER — Encounter (HOSPITAL_COMMUNITY): Payer: Self-pay | Admitting: Emergency Medicine

## 2018-01-02 ENCOUNTER — Other Ambulatory Visit: Payer: Self-pay

## 2018-01-02 DIAGNOSIS — R42 Dizziness and giddiness: Secondary | ICD-10-CM

## 2018-01-02 DIAGNOSIS — Z79899 Other long term (current) drug therapy: Secondary | ICD-10-CM | POA: Insufficient documentation

## 2018-01-02 DIAGNOSIS — I129 Hypertensive chronic kidney disease with stage 1 through stage 4 chronic kidney disease, or unspecified chronic kidney disease: Secondary | ICD-10-CM | POA: Insufficient documentation

## 2018-01-02 DIAGNOSIS — Z7984 Long term (current) use of oral hypoglycemic drugs: Secondary | ICD-10-CM | POA: Diagnosis not present

## 2018-01-02 DIAGNOSIS — N189 Chronic kidney disease, unspecified: Secondary | ICD-10-CM | POA: Insufficient documentation

## 2018-01-02 DIAGNOSIS — E1122 Type 2 diabetes mellitus with diabetic chronic kidney disease: Secondary | ICD-10-CM | POA: Insufficient documentation

## 2018-01-02 DIAGNOSIS — Z87891 Personal history of nicotine dependence: Secondary | ICD-10-CM | POA: Insufficient documentation

## 2018-01-02 DIAGNOSIS — E1165 Type 2 diabetes mellitus with hyperglycemia: Secondary | ICD-10-CM | POA: Insufficient documentation

## 2018-01-02 DIAGNOSIS — Z96651 Presence of right artificial knee joint: Secondary | ICD-10-CM | POA: Diagnosis not present

## 2018-01-02 DIAGNOSIS — J45909 Unspecified asthma, uncomplicated: Secondary | ICD-10-CM | POA: Diagnosis not present

## 2018-01-02 DIAGNOSIS — G35 Multiple sclerosis: Secondary | ICD-10-CM | POA: Diagnosis not present

## 2018-01-02 LAB — COMPREHENSIVE METABOLIC PANEL
ALBUMIN: 4.2 g/dL (ref 3.5–5.0)
ALK PHOS: 35 U/L — AB (ref 38–126)
ALT: 18 U/L (ref 14–54)
AST: 17 U/L (ref 15–41)
Anion gap: 11 (ref 5–15)
BUN: 31 mg/dL — ABNORMAL HIGH (ref 6–20)
CALCIUM: 10.8 mg/dL — AB (ref 8.9–10.3)
CO2: 23 mmol/L (ref 22–32)
CREATININE: 1.2 mg/dL — AB (ref 0.44–1.00)
Chloride: 106 mmol/L (ref 101–111)
GFR calc Af Amer: 56 mL/min — ABNORMAL LOW (ref >60)
GFR calc non Af Amer: 48 mL/min — ABNORMAL LOW (ref >60)
GLUCOSE: 184 mg/dL — AB (ref 65–99)
Potassium: 4.2 mmol/L (ref 3.5–5.1)
SODIUM: 140 mmol/L (ref 135–145)
Total Bilirubin: 0.9 mg/dL (ref 0.3–1.2)
Total Protein: 7.7 g/dL (ref 6.5–8.1)

## 2018-01-02 LAB — CBC WITH DIFFERENTIAL/PLATELET
Basophils Absolute: 0 10*3/uL (ref 0.0–0.1)
Basophils Relative: 1 %
EOS ABS: 0.2 10*3/uL (ref 0.0–0.7)
Eosinophils Relative: 3 %
HCT: 43.1 % (ref 36.0–46.0)
HEMOGLOBIN: 13.4 g/dL (ref 12.0–15.0)
LYMPHS ABS: 1.8 10*3/uL (ref 0.7–4.0)
LYMPHS PCT: 31 %
MCH: 27.5 pg (ref 26.0–34.0)
MCHC: 31.1 g/dL (ref 30.0–36.0)
MCV: 88.3 fL (ref 78.0–100.0)
MONOS PCT: 5 %
Monocytes Absolute: 0.3 10*3/uL (ref 0.1–1.0)
NEUTROS PCT: 60 %
Neutro Abs: 3.5 10*3/uL (ref 1.7–7.7)
Platelets: 342 10*3/uL (ref 150–400)
RBC: 4.88 MIL/uL (ref 3.87–5.11)
RDW: 14.6 % (ref 11.5–15.5)
WBC: 5.8 10*3/uL (ref 4.0–10.5)

## 2018-01-02 LAB — TROPONIN I

## 2018-01-02 NOTE — ED Triage Notes (Signed)
Pt states she sees static and loses her place since Friday. Pt states it is almost like dizziness. Pt has history of previous stroke.

## 2018-01-02 NOTE — ED Provider Notes (Signed)
[name] EMERGENCY DEPARTMENT Provider Note   CSN: [id number] Arrival date & time: 01/02/18  3810     History   Chief Complaint Chief Complaint  Patient presents with  . Dizziness    HPI Teresa Soto is a 62 y.o. female.  Complains of dizziness meaning lightheadedness onset 5 days ago, gradual onset accompanied by feeling of "staticky" vision" meaning flashes across both eyes.  She denies blurred vision.  Denies headache.  Denies feelings of room spinning no nausea no chest pain no headache nothing makes symptoms better or worse no fever.  No other associated symptoms and no difficulty with speech HPI  Past Medical History:  Diagnosis Date  . Arthritis   . 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 Asthma   . Chronic back pain   . Chronic constipation   . Depression   . Heart murmur    in childhood  . History of bronchitis   . History of urinary tract infection   . Hypertension   . Imbalance   . Meniere disease   . Neuropathic pain   . Parathyroid adenoma   . Poor fine motor skills    secondary to CVA per right side   . Pre-diabetes   . PTSD (post-traumatic stress disorder)   . Restless legs syndrome 2007 approx  . Seasonal allergies   . Shortness of breath dyspnea    coldness   . Stroke ([place])   . Vertigo     Patient Active Problem List   Diagnosis Date Noted  . Diabetes mellitus, type II ([place]) 11/22/2016  . Osteoporosis 04/03/2016  . 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 Primary osteoarthritis of right knee 07/27/2014  . OA (osteoarthritis) of knee 06/16/2014  . Hyperparathyroidism, primary ([place]) 06/16/2014  . Tinea versicolor 06/16/2014  . Obesity 04/06/2013  . Lack of coordination 10/21/2012  . CVA (cerebral infarction) 10/10/2012  . Vitamin D deficiency 11/10/2011  . Hearing loss 06/29/2011  . Carotid bruit 06/29/2011  . BACK PAIN, LUMBAR, WITH RADICULOPATHY 06/28/2010  . 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/04/2010  . PERIPHERAL EDEMA 04/08/2009  .  DEGENERATIVE JOINT DISEASE 03/23/2008  . Hyperlipidemia 11/18/2007  . INSOMNIA 10/17/2007  . Asthma 11/30/2006  . Essential hypertension 09/05/2006  . ARTHRITIS 09/05/2006  . VERTIGO 09/05/2006    Past Surgical History:  Procedure Laterality Date  . BACK SURGERY    . CESAREAN SECTION     x 2  . PARATHYROIDECTOMY N/A 06/27/2016   Procedure: MINIMALLY INVASIVE PARATHYROIDECTOMY;  Surgeon: Greer Pickerel, MD;  Location: [facility]S;  Service: General;  Laterality: N/A;  . SHOULDER ARTHROSCOPY W/ ROTATOR CUFF REPAIR     to right shoulder  . SPINE SURGERY  12/2010   ruptd L1 L2 , Dr Carloyn Manner  . TONSILLECTOMY    . TOTAL KNEE ARTHROPLASTY Right 08/12/2014   Procedure: RIGHT TOTAL KNEE ARTHROPLASTY;  Surgeon: Carole Civil, MD;  Location: [facility];  Service: Orthopedics;  Laterality: Right;  . TUBAL LIGATION       OB History   None      Home Medications    Prior to Admission medications   Medication Sig Start Date End Date Taking? Authorizing Provider  Albuterol Sulfate (PROAIR RESPICLICK) 175 (90 Base) MCG/ACT AEPB Inhale 2 puffs into the lungs every 4 (four) hours as needed. For shortness of breath. 11/22/16   Alycia Rossetti, MD  BIOTIN PO Take 1 tablet by mouth every evening.     [provider]  Blood Glucose  Monitoring Suppl (BLOOD GLUCOSE SYSTEM PAK) KIT Please dispense based on patient and insurance preference. Use as directed to monitor FSBS 1x daily. Dx: E11.9. 11/28/16   Alycia Rossetti, MD  dimenhyDRINATE (DRAMAMINE) 50 MG tablet Take 50 mg by mouth every 8 (eight) hours as needed.    [provider]  donepezil (ARICEPT) 5 MG tablet  12/05/17   [provider]  escitalopram (LEXAPRO) 20 MG tablet TAKE 1 TABLET BY MOUTH ONCE DAILY 11/26/17   Alycia Rossetti, MD  fenofibrate (TRICOR) 145 MG tablet TAKE 1 TABLET BY MOUTH ONCE DAILY 12/12/17   Alycia Rossetti, MD  gabapentin (NEURONTIN) 300 MG capsule TAKE 2 CAPSULE BY MOUTH AT BEDTIME 12/24/17   [place],  Modena Nunnery, MD  Glucose Blood (BLOOD GLUCOSE TEST STRIPS) STRP Please dispense based on patient and insurance preference. Use as directed to monitor FSBS 1x daily. Dx: E11.9. 11/28/16   Alycia Rossetti, MD  JARDIANCE 10 MG TABS tablet TAKE 1 TABLET BY MOUTH ONCE DAILY 12/12/17   Alycia Rossetti, MD  Lancet Devices MISC Please dispense based on patient and insurance preference. Use as directed to monitor FSBS 1x daily. Dx: E11.9. 11/28/16   Alycia Rossetti, MD  Lancets MISC Please dispense based on patient and insurance preference. Use as directed to monitor FSBS 1x daily. Dx: E11.9. 11/28/16   Alycia Rossetti, MD  lisinopril (PRINIVIL,ZESTRIL) 10 MG tablet TAKE 1 TABLET BY MOUTH ONCE DAILY 08/20/17   [place], Modena Nunnery, MD  mometasone (NASONEX) 50 MCG/ACT nasal spray Place 2 sprays into the nose daily as needed. For seasonal allergies. 11/22/16   [place], Modena Nunnery, MD  montelukast (SINGULAIR) 10 MG tablet TAKE 1 TABLET BY MOUTH ONCE DAILY IN THE EVENING 11/26/17   [place], Modena Nunnery, MD  naproxen (NAPROSYN) 500 MG tablet TAKE 1 TABLET BY MOUTH TWICE DAILY WITH A MEAL 12/24/17   [place], Modena Nunnery, MD  ondansetron (ZOFRAN) 4 MG tablet Take 1 tablet (4 mg total) by mouth every 6 (six) hours. 11/22/16   Alycia Rossetti, MD  polyethylene glycol [facility] / Floria Raveling) packet Take 17 g by mouth daily. Patient taking differently: Take 17 g by mouth every other day.  08/16/14   Carole Civil, MD  pramipexole (MIRAPEX) 0.25 MG tablet TAKE 1 TABLET BY MOUTH ONCE DAILY WITH BREAKFAST Patient taking differently: TAKE 1 TABLET BY MOUTH ONCE DAILY AT BEDTIME 12/06/17   [place], Modena Nunnery, MD  furosemide (LASIX) 40 MG tablet Take 0.5 tablets (20 mg total) by mouth daily. Patient not taking: Reported on 09/09/2014 08/16/14 10/07/14  Carole Civil, MD    Family History Family History  Problem Relation Age of Onset  . Dementia Paternal Grandmother     Social History Social History   Tobacco Use  .  Smoking status: Former Smoker    Packs/day: 2.00    Years: 6.00    Pack years: 12.00    Types: Cigarettes    Last attempt to quit: 10/09/1982    Years since quitting: 35.2  . Smokeless tobacco: Never Used  . Tobacco comment: quit in 1984  Substance Use Topics  . Alcohol use: Yes    Comment: occasionally  . Drug use: No     Allergies   Other; Statins; and Influenza vaccines   Review of Systems Review of Systems  Constitutional: Negative.   HENT: Negative.   Eyes: Positive for visual disturbance.  Respiratory: Negative.   Cardiovascular: Negative.   Gastrointestinal: Negative.  Musculoskeletal: Negative.   Skin: Negative.   Neurological: Positive for light-headedness and numbness.       Chronic numbness in fingers of right hand since  stroke 4 years ago  Psychiatric/Behavioral: Negative.   All other systems reviewed and are negative.    Physical Exam Updated Vital Signs BP (!) 155/91 (BP Location: Left Arm)   Pulse 79   Temp 98.5 F (36.9 C) (Oral)   Resp 20   Ht _0  (1.575 m)   Wt 80.3 kg (177 lb)   SpO2 97%   BMI 32.37 kg/m   Physical Exam  Constitutional: She is oriented to person, place, and time. She appears well-developed and well-nourished.  HENT:  Head: Normocephalic and atraumatic.  No facial asymmetry  Eyes: Pupils are equal, round, and reactive to light. Conjunctivae are normal.  Visual fields normal  Neck: Neck supple. No tracheal deviation present. No thyromegaly present.  Cardiovascular: Normal rate and regular rhythm.  No murmur heard. Pulmonary/Chest: Effort normal and breath sounds normal.  Abdominal: Soft. Bowel sounds are normal. She exhibits no distension. There is no tenderness.  Musculoskeletal: Normal range of motion. She exhibits no edema or tenderness.  Neurological: She is alert and oriented to person, place, and time. She displays normal reflexes. Coordination normal.  Gait normal Romberg normal pronator drift normal finger to  nose normal DTR symmetric bilaterally at knee jerk ankle jerk biceps toes downward going bilaterally  Skin: Skin is warm and dry. No rash noted.  Psychiatric: She has a normal mood and affect.  Nursing note and vitals reviewed.    ED Treatments / Results  Labs (all labs ordered are listed, but only abnormal results are displayed) Labs Reviewed - No data to display  EKG None  EKG Interpretation  Date/Time:  Wednesday January 02 2018 07:32:27 EDT Ventricular Rate:  73 PR Interval:    QRS Duration: 101 QT Interval:  380 QTC Calculation: 419 R Axis:   85 Text Interpretation:  Sinus rhythm Consider left atrial enlargement Borderline right axis deviation Baseline wander in lead(s) II III aVF No significant change since last tracing Confirmed by Orlie Dakin [id number]) on 01/02/2018 8:02:32 AM      Radiology No results found.  Procedures Procedures (including critical care time)  Medications Ordered in ED Medications - No data to display  Results for orders placed or performed during the hospital encounter of 01/02/18  Comprehensive metabolic panel  Result Value Ref Range   Sodium 140 135 - 145 mmol/L   Potassium 4.2 3.5 - 5.1 mmol/L   Chloride 106 101 - 111 mmol/L   CO2 23 22 - 32 mmol/L   Glucose, Bld 184 (H) 65 - 99 mg/dL   BUN 31 (H) 6 - 20 mg/dL   Creatinine, Ser 1.20 (H) 0.44 - 1.00 mg/dL   Calcium 10.8 (H) 8.9 - 10.3 mg/dL   Total Protein 7.7 6.5 - 8.1 g/dL   Albumin 4.2 3.5 - 5.0 g/dL   AST 17 15 - 41 U/L   ALT 18 14 - 54 U/L   Alkaline Phosphatase 35 (L) 38 - 126 U/L   Total Bilirubin 0.9 0.3 - 1.2 mg/dL   GFR calc non Af Amer 48 (L) >60 mL/min   GFR calc Af Amer 56 (L) >60 mL/min   Anion gap 11 5 - 15  CBC with Differential/Platelet  Result Value Ref Range   WBC 5.8 4.0 - 10.5 K/uL   RBC 4.88 3.87 - 5.11 MIL/uL   Hemoglobin  13.4 12.0 - 15.0 g/dL   HCT 43.1 36.0 - 46.0 %   MCV 88.3 78.0 - 100.0 fL   MCH 27.5 26.0 - 34.0 pg   MCHC 31.1 30.0 - 36.0 g/dL    RDW 14.6 11.5 - 15.5 %   Platelets 342 150 - 400 K/uL   Neutrophils Relative % 60 %   Neutro Abs 3.5 1.7 - 7.7 K/uL   Lymphocytes Relative 31 %   Lymphs Abs 1.8 0.7 - 4.0 K/uL   Monocytes Relative 5 %   Monocytes Absolute 0.3 0.1 - 1.0 K/uL   Eosinophils Relative 3 %   Eosinophils Absolute 0.2 0.0 - 0.7 K/uL   Basophils Relative 1 %   Basophils Absolute 0.0 0.0 - 0.1 K/uL  Troponin I  Result Value Ref Range   Troponin I <0.03 <0.03 ng/mL   Ct Head Wo Contrast  Result Date: 01/02/2018 CLINICAL DATA:  History of multiple sclerosis with nausea and sense of intracranial vibration EXAM: CT HEAD WITHOUT CONTRAST TECHNIQUE: Contiguous axial images were obtained from the base of the skull through the vertex without intravenous contrast. COMPARISON:  Head CT May 22, 2014 and brain MRI July 29, 2015 FINDINGS: Brain: The ventricles are normal in size and configuration. There is no intracranial mass, hemorrhage, extra-axial fluid collection, or midline shift. The gray-white compartments appear normal. No acute infarct is demonstrable on this study. Vascular: No hyperdense vessel. There are scattered foci of calcification in the carotid siphon region. Skull: Bony calvarium appears intact. Sinuses/Orbits: There is slight mucosal thickening in several ethmoid air cells. Other visualized paranasal sinuses are clear. Visualized orbits appear symmetric bilaterally. Other: Mastoid air cells are clear. IMPRESSION: No mass or hemorrhage. No focal gray-white compartment lesions evident. No acute infarct appreciable. Foci of arterial vascular calcification noted. Mild mucosal thickening in several ethmoid air cells. Electronically Signed   By: Lowella Grip III M.D.   On: 01/02/2018 09:04   Initial Impression / Assessment and Plan / ED Course  I have reviewed the triage vital signs and the nursing notes.  Pertinent labs & imaging results that were available during my care of the patient were reviewed by me  and considered in my medical decision making (see chart for details).    9:25 AM patient resting comfortably states symptoms are unchanged.  She is alert and appears in no distress Glasgow Coma Score 15.  Very low pretest clinical suspicion of stroke.  Nonfocal neurologic exam.  Symptoms gradual in onset.  Symptoms are rather nonspecific Renal insufficiency and mild hypercalcemia chronic  follow-up with PMD .blood pressure recheck 3 weeks Final Clinical Impressions(s) / ED Diagnoses   Dx #1 dizziness #2 chronic renal insufficiency #3 hyperglycemia #4 elevated blood pressure #5 mild hypercalcemia Final diagnoses:  None    ED Discharge Orders    None       Orlie Dakin, MD 01/02/18 [phone]

## 2018-01-02 NOTE — Discharge Instructions (Addendum)
Call your primary care physician today to schedule an office visit for within the next 3 weeks.  Your blood pressure should be rechecked in the office.  Today's was mildly elevated at 143/81 blood sugar was also mildly elevated at 184.

## 2018-01-02 NOTE — ED Notes (Signed)
Patient walked to restroom without any assistance.

## 2018-01-03 ENCOUNTER — Encounter: Payer: Self-pay | Admitting: Orthopedic Surgery

## 2018-01-03 ENCOUNTER — Ambulatory Visit: Payer: BLUE CROSS/BLUE SHIELD | Admitting: Orthopedic Surgery

## 2018-01-03 VITALS — BP 166/90 | HR 112 | Ht 62.0 in | Wt 176.0 lb

## 2018-01-03 DIAGNOSIS — M65341 Trigger finger, right ring finger: Secondary | ICD-10-CM | POA: Diagnosis not present

## 2018-01-03 NOTE — Progress Notes (Signed)
Progress Note   Patient ID: Teresa Soto, female   DOB: 21-Sep-1956, 62 y.o.   MRN: [id number]  Chief Complaint  Patient presents with  . Follow-up    Right ring trigger finger    My ring finger is catching again   62 years old status post injection right ring finger over a year ago complains of recurrent catching and locking    Review of Systems  Musculoskeletal: Positive for joint pain.   Current Meds  Medication Sig  . Albuterol Sulfate (PROAIR RESPICLICK) 150 (90 Base) MCG/ACT AEPB Inhale 2 puffs into the lungs every 4 (four) hours as needed. For shortness of breath.  Marland Kitchen BIOTIN PO Take 1 tablet by mouth every evening.   . Blood Glucose Monitoring Suppl (BLOOD GLUCOSE SYSTEM PAK) KIT Please dispense based on patient and insurance preference. Use as directed to monitor FSBS 1x daily. Dx: E11.9.  . dimenhyDRINATE (DRAMAMINE) 50 MG tablet Take 50 mg by mouth every 8 (eight) hours as needed.  . donepezil (ARICEPT) 5 MG tablet   . escitalopram (LEXAPRO) 20 MG tablet TAKE 1 TABLET BY MOUTH ONCE DAILY  . fenofibrate (TRICOR) 145 MG tablet TAKE 1 TABLET BY MOUTH ONCE DAILY  . gabapentin (NEURONTIN) 300 MG capsule TAKE 2 CAPSULE BY MOUTH AT BEDTIME  . Glucose Blood (BLOOD GLUCOSE TEST STRIPS) STRP Please dispense based on patient and insurance preference. Use as directed to monitor FSBS 1x daily. Dx: E11.9.  Marland Kitchen JARDIANCE 10 MG TABS tablet TAKE 1 TABLET BY MOUTH ONCE DAILY  . Lancet Devices MISC Please dispense based on patient and insurance preference. Use as directed to monitor FSBS 1x daily. Dx: E11.9.  . Lancets MISC Please dispense based on patient and insurance preference. Use as directed to monitor FSBS 1x daily. Dx: E11.9.  . lisinopril (PRINIVIL,ZESTRIL) 10 MG tablet TAKE 1 TABLET BY MOUTH ONCE DAILY  . mometasone (NASONEX) 50 MCG/ACT nasal spray Place 2 sprays into the nose daily as needed. For seasonal allergies.  . montelukast (SINGULAIR) 10 MG tablet TAKE 1 TABLET BY MOUTH ONCE  DAILY IN THE EVENING  . naproxen (NAPROSYN) 500 MG tablet TAKE 1 TABLET BY MOUTH TWICE DAILY WITH A MEAL  . ondansetron (ZOFRAN) 4 MG tablet Take 1 tablet (4 mg total) by mouth every 6 (six) hours.  . polyethylene glycol (MIRALAX / GLYCOLAX) packet Take 17 g by mouth daily. (Patient taking differently: Take 17 g by mouth every other day. )  . pramipexole (MIRAPEX) 0.25 MG tablet TAKE 1 TABLET BY MOUTH ONCE DAILY WITH BREAKFAST (Patient taking differently: TAKE 1 TABLET BY MOUTH ONCE DAILY AT BEDTIME)    Allergies  Allergen Reactions  . Other Nausea And Vomiting    Peppers  . Statins   . Influenza Vaccines Rash     BP (!) 166/90   Pulse (!) 112   Ht '5\' 2"'  (1.575 m)   Wt 176 lb (79.8 kg)   BMI 32.19 kg/m   Physical Exam  Constitutional: She is oriented to person, place, and time. She appears well-developed and well-nourished.  Musculoskeletal:       Hands: Neurological: She is alert and oriented to person, place, and time.  Psychiatric: She has a normal mood and affect. Judgment normal.  Vitals reviewed.    Medical decision-making Encounter Diagnosis  Name Primary?  . Trigger ring finger of right hand Yes    Trigger finger injection  Diagnosis tenosynovitis right ring finger Procedure injection A1 pulley Medications lidocaine 1% 1 mL  and Depo-Medrol 40 mg 1 mL Skin prep alcohol and ethyl chloride Verbal consent was obtained Timeout confirmed the injection site  After cleaning the skin with alcohol and anesthetizing the skin with ethyl chloride the A1 pulley was palpated and the injection was performed without complication  No orders of the defined types were placed in this encounter.    Arther Abbott, MD 01/03/2018 2:49 PM

## 2018-01-10 ENCOUNTER — Other Ambulatory Visit: Payer: Self-pay | Admitting: Family Medicine

## 2018-02-08 ENCOUNTER — Ambulatory Visit: Payer: BLUE CROSS/BLUE SHIELD | Admitting: Family Medicine

## 2018-02-08 ENCOUNTER — Other Ambulatory Visit: Payer: Self-pay

## 2018-02-08 ENCOUNTER — Encounter: Payer: Self-pay | Admitting: Family Medicine

## 2018-02-08 VITALS — BP 142/80 | HR 82 | Temp 98.8°F | Resp 14 | Ht 62.0 in | Wt 174.0 lb

## 2018-02-08 DIAGNOSIS — E119 Type 2 diabetes mellitus without complications: Secondary | ICD-10-CM | POA: Diagnosis not present

## 2018-02-08 DIAGNOSIS — E782 Mixed hyperlipidemia: Secondary | ICD-10-CM | POA: Diagnosis not present

## 2018-02-08 DIAGNOSIS — H6122 Impacted cerumen, left ear: Secondary | ICD-10-CM

## 2018-02-08 DIAGNOSIS — I10 Essential (primary) hypertension: Secondary | ICD-10-CM

## 2018-02-08 DIAGNOSIS — H65192 Other acute nonsuppurative otitis media, left ear: Secondary | ICD-10-CM | POA: Diagnosis not present

## 2018-02-08 LAB — LIPID PANEL
CHOLESTEROL: 165 mg/dL (ref <200)
HDL: 46 mg/dL — AB (ref >50)
LDL Cholesterol (Calc): 95 mg/dL (calc)
NON-HDL CHOLESTEROL (CALC): 119 mg/dL (ref <130)
Total CHOL/HDL Ratio: 3.6 (calc) (ref <5.0)
Triglycerides: 138 mg/dL (ref <150)

## 2018-02-08 LAB — COMPREHENSIVE METABOLIC PANEL
AG Ratio: 1.6 (calc) (ref 1.0–2.5)
ALKALINE PHOSPHATASE (APISO): 32 U/L — AB (ref 33–130)
ALT: 11 U/L (ref 6–29)
AST: 14 U/L (ref 10–35)
Albumin: 4.2 g/dL (ref 3.6–5.1)
BUN / CREAT RATIO: 20 (calc) (ref 6–22)
BUN: 25 mg/dL (ref 7–25)
CO2: 26 mmol/L (ref 20–32)
CREATININE: 1.25 mg/dL — AB (ref 0.50–0.99)
Calcium: 10 mg/dL (ref 8.6–10.4)
Chloride: 107 mmol/L (ref 98–110)
GLOBULIN: 2.6 g/dL (ref 1.9–3.7)
GLUCOSE: 187 mg/dL — AB (ref 65–99)
Potassium: 4.4 mmol/L (ref 3.5–5.3)
Sodium: 141 mmol/L (ref 135–146)
Total Bilirubin: 0.4 mg/dL (ref 0.2–1.2)
Total Protein: 6.8 g/dL (ref 6.1–8.1)

## 2018-02-08 NOTE — Patient Instructions (Signed)
F/U 2 months for Physical   

## 2018-02-08 NOTE — Progress Notes (Signed)
   Subjective:    Patient ID: Teresa Soto, female    DOB: 11/29/1955, 62 y.o.   MRN: [id number]  Patient presents for Follow-up (is not fasting) and Ear Pressure  Patient here to follow-up chronic medical problems medications reviewed Diabetes mellitus-A1c 6% noted in chart she is due for repeat A1c she is taking Jardiance 10 mg daily.  She is on ACE inhibitor as well as before she does not tolerate statin drugs CBGs yesterday 148, verbally runs <100 fasting , hypoglycemia symptoms  Has new glucometer and OTC strips    Hyperlipidemia due for repeat lipid panel  Hypertension- taken blood pressure medicine as prescribed  History of stroke she is followed by [facility] neurology she also has some memory difficulties.  On Aricept 5 mg once a day  Ear pressure- Feels like she cant hear well, feels like ears need to pop, wearing hearing aides, symptoms for month, this has triggered some vertigo, flonase has helped that     Review Of Systems:  GEN- denies fatigue, fever, weight loss,weakness, recent illness HEENT- denies eye drainage, change in vision, nasal discharge, CVS- denies chest pain, palpitations RESP- denies SOB, cough, wheeze ABD- denies N/V, change in stools, abd pain GU- denies dysuria, hematuria, dribbling, incontinence MSK- denies joint pain, muscle aches, injury Neuro- denies headache, dizziness, syncope, seizure activity       Objective:    BP (!) 142/80   Pulse 82   Temp 98.8 F (37.1 C) (Oral)   Resp 14   Ht 5\' 2"  (1.575 m)   Wt 174 lb (78.9 kg)   SpO2 98%   BMI 31.83 kg/m  GEN- NAD, alert and oriented x3 HEENT- PERRL, EOMI, non injected sclera, pink conjunctiva, MMM, oropharynx clear, wax obsurring left TM, s/p irrigation, clear fluid noted, no erythyema, RIght TM in tact no erythema, no fluid Neck- Supple, no thyromegaly, no LAD CVS- RRR, no murmur RESP-CTAB ABD-NABS,soft,NT,ND EXT- No edema Pulses- Radial, DP- 2+        Assessment & Plan:      Problem List Items Addressed This Visit      Unprioritized   Hyperlipidemia - Primary   Relevant Orders   Lipid panel (Completed)   Essential hypertension    Well controlled      Relevant Orders   Comprehensive metabolic panel (Completed)   Diabetes mellitus, type II ([place])    Recheck A1c goal <7% On tricor, ACEI      Relevant Orders   Hemoglobin A1c (Completed)    Other Visit Diagnoses    Impacted cerumen of left ear       s/p irrigation   Acute effusion of left ear       likley allergy related no sign of infection, decongest for a few days, can call ENT if does not improve      Note: This dictation was prepared with Dragon dictation along with smaller phrase technology. Any transcriptional errors that result from this process are unintentional.

## 2018-02-09 LAB — HEMOGLOBIN A1C
EAG (MMOL/L): 7.7 (calc)
HEMOGLOBIN A1C: 6.5 %{Hb} — AB (ref <5.7)
Mean Plasma Glucose: 140 (calc)

## 2018-02-10 ENCOUNTER — Encounter: Payer: Self-pay | Admitting: Family Medicine

## 2018-02-10 NOTE — Addendum Note (Signed)
Addended by: Vic Blackbird F on: 02/10/2018 06:39 PM   Modules accepted: Orders

## 2018-02-10 NOTE — Assessment & Plan Note (Signed)
Well controlled 

## 2018-02-10 NOTE — Assessment & Plan Note (Signed)
Recheck A1c goal <7% On tricor, ACEI

## 2018-02-11 ENCOUNTER — Encounter: Payer: Self-pay | Admitting: *Deleted

## 2018-02-18 ENCOUNTER — Ambulatory Visit (INDEPENDENT_AMBULATORY_CARE_PROVIDER_SITE_OTHER): Payer: BLUE CROSS/BLUE SHIELD | Admitting: Otolaryngology

## 2018-02-18 ENCOUNTER — Other Ambulatory Visit: Payer: Self-pay | Admitting: Family Medicine

## 2018-02-18 DIAGNOSIS — H9041 Sensorineural hearing loss, unilateral, right ear, 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: Secondary | ICD-10-CM

## 2018-02-18 DIAGNOSIS — Z1231 Encounter for screening mammogram for malignant neoplasm of breast: Secondary | ICD-10-CM

## 2018-02-18 DIAGNOSIS — H6983 Other specified disorders of Eustachian tube, bilateral: Secondary | ICD-10-CM | POA: Diagnosis not present

## 2018-02-25 ENCOUNTER — Ambulatory Visit (HOSPITAL_COMMUNITY)
Admission: RE | Admit: 2018-02-25 | Discharge: 2018-02-25 | Disposition: A | Discharge status: Home / Self Care | Payer: BLUE CROSS/BLUE SHIELD | Source: Ambulatory Visit | Attending: Family Medicine | Admitting: Family Medicine

## 2018-02-25 ENCOUNTER — Encounter (HOSPITAL_COMMUNITY): Payer: Self-pay

## 2018-02-25 DIAGNOSIS — Z1231 Encounter for screening mammogram for malignant neoplasm of breast: Secondary | ICD-10-CM | POA: Insufficient documentation

## 2018-02-28 ENCOUNTER — Other Ambulatory Visit: Payer: Self-pay | Admitting: Family Medicine

## 2018-03-06 ENCOUNTER — Other Ambulatory Visit (HOSPITAL_COMMUNITY): Payer: Self-pay | Admitting: Neurosurgery

## 2018-03-06 DIAGNOSIS — M47816 Spondylosis without myelopathy or radiculopathy, lumbar region: Secondary | ICD-10-CM | POA: Diagnosis not present

## 2018-03-06 DIAGNOSIS — M5126 Other intervertebral disc displacement, lumbar region: Secondary | ICD-10-CM | POA: Diagnosis not present

## 2018-03-06 DIAGNOSIS — Z6832 Body mass index (BMI) 32.0-32.9, adult: Secondary | ICD-10-CM | POA: Diagnosis not present

## 2018-03-13 ENCOUNTER — Ambulatory Visit (HOSPITAL_COMMUNITY)
Admission: RE | Admit: 2018-03-13 | Discharge: 2018-03-13 | Disposition: A | Discharge status: Home / Self Care | Payer: BLUE CROSS/BLUE SHIELD | Source: Ambulatory Visit | Attending: Neurosurgery | Admitting: Neurosurgery

## 2018-03-13 DIAGNOSIS — M5136 Other intervertebral disc degeneration, lumbar region: Secondary | ICD-10-CM | POA: Diagnosis not present

## 2018-03-13 DIAGNOSIS — M47816 Spondylosis without myelopathy or radiculopathy, lumbar region: Secondary | ICD-10-CM

## 2018-03-13 DIAGNOSIS — N132 Hydronephrosis with renal and ureteral calculous obstruction: Secondary | ICD-10-CM | POA: Insufficient documentation

## 2018-03-13 DIAGNOSIS — M545 Low back pain: Secondary | ICD-10-CM | POA: Diagnosis not present

## 2018-03-13 DIAGNOSIS — M48061 Spinal stenosis, lumbar region without neurogenic claudication: Secondary | ICD-10-CM | POA: Insufficient documentation

## 2018-03-13 DIAGNOSIS — M5126 Other intervertebral disc displacement, lumbar region: Secondary | ICD-10-CM | POA: Insufficient documentation

## 2018-03-21 DIAGNOSIS — M5126 Other intervertebral disc displacement, lumbar region: Secondary | ICD-10-CM | POA: Diagnosis not present

## 2018-03-21 DIAGNOSIS — Z6832 Body mass index (BMI) 32.0-32.9, adult: Secondary | ICD-10-CM | POA: Diagnosis not present

## 2018-03-21 DIAGNOSIS — M47816 Spondylosis without myelopathy or radiculopathy, lumbar region: Secondary | ICD-10-CM | POA: Diagnosis not present

## 2018-03-22 DIAGNOSIS — N132 Hydronephrosis with renal and ureteral calculous obstruction: Secondary | ICD-10-CM | POA: Diagnosis not present

## 2018-03-22 DIAGNOSIS — N202 Calculus of kidney with calculus of ureter: Secondary | ICD-10-CM | POA: Diagnosis not present

## 2018-04-01 ENCOUNTER — Ambulatory Visit (INDEPENDENT_AMBULATORY_CARE_PROVIDER_SITE_OTHER): Payer: BLUE CROSS/BLUE SHIELD | Admitting: Otolaryngology

## 2018-04-01 DIAGNOSIS — H6982 Other specified disorders of Eustachian tube, left ear: Secondary | ICD-10-CM | POA: Diagnosis not present

## 2018-04-01 DIAGNOSIS — H8101 Meniere's disease, right ear: Secondary | ICD-10-CM | POA: Diagnosis not present

## 2018-04-03 DIAGNOSIS — N202 Calculus of kidney with calculus of ureter: Secondary | ICD-10-CM | POA: Diagnosis not present

## 2018-04-03 DIAGNOSIS — N1330 Unspecified hydronephrosis: Secondary | ICD-10-CM | POA: Diagnosis not present

## 2018-04-03 DIAGNOSIS — N201 Calculus of ureter: Secondary | ICD-10-CM | POA: Diagnosis not present

## 2018-04-09 DIAGNOSIS — N202 Calculus of kidney with calculus of ureter: Secondary | ICD-10-CM | POA: Diagnosis not present

## 2018-04-27 ENCOUNTER — Other Ambulatory Visit: Payer: Self-pay | Admitting: Family Medicine

## 2018-05-01 DIAGNOSIS — M47816 Spondylosis without myelopathy or radiculopathy, lumbar region: Secondary | ICD-10-CM | POA: Diagnosis not present

## 2018-05-01 DIAGNOSIS — Z6832 Body mass index (BMI) 32.0-32.9, adult: Secondary | ICD-10-CM | POA: Diagnosis not present

## 2018-05-01 DIAGNOSIS — M5126 Other intervertebral disc displacement, lumbar region: Secondary | ICD-10-CM | POA: Diagnosis not present

## 2018-05-07 ENCOUNTER — Ambulatory Visit (INDEPENDENT_AMBULATORY_CARE_PROVIDER_SITE_OTHER): Payer: BLUE CROSS/BLUE SHIELD | Admitting: Family Medicine

## 2018-05-07 ENCOUNTER — Encounter: Payer: Self-pay | Admitting: Family Medicine

## 2018-05-07 ENCOUNTER — Other Ambulatory Visit: Payer: Self-pay

## 2018-05-07 VITALS — BP 128/64 | HR 78 | Temp 98.4°F | Resp 16 | Ht 62.0 in | Wt 172.0 lb

## 2018-05-07 DIAGNOSIS — Z6831 Body mass index (BMI) 31.0-31.9, adult: Secondary | ICD-10-CM

## 2018-05-07 DIAGNOSIS — Z1211 Encounter for screening for malignant neoplasm of colon: Secondary | ICD-10-CM

## 2018-05-07 DIAGNOSIS — Z0000 Encounter for general adult medical examination without abnormal findings: Secondary | ICD-10-CM

## 2018-05-07 DIAGNOSIS — E119 Type 2 diabetes mellitus without complications: Secondary | ICD-10-CM | POA: Diagnosis not present

## 2018-05-07 DIAGNOSIS — M8000XA Age-related osteoporosis with current pathological fracture, unspecified site, initial encounter for fracture: Secondary | ICD-10-CM

## 2018-05-07 DIAGNOSIS — B360 Pityriasis versicolor: Secondary | ICD-10-CM

## 2018-05-07 DIAGNOSIS — E6609 Other obesity due to excess calories: Secondary | ICD-10-CM

## 2018-05-07 MED ORDER — FLUCONAZOLE 150 MG PO TABS: ORAL_TABLET | ORAL | 0 refills | Status: DC

## 2018-05-07 NOTE — Assessment & Plan Note (Signed)
Treat with diflucan and nizoral

## 2018-05-07 NOTE — Assessment & Plan Note (Signed)
Following dietitian plan

## 2018-05-07 NOTE — Assessment & Plan Note (Signed)
Plan for bone density in the winter

## 2018-05-07 NOTE — Assessment & Plan Note (Signed)
controlled 

## 2018-05-07 NOTE — Patient Instructions (Addendum)
Cologuard given  Take dilfucan weekly, use Nizoral  Bone Density in the winter  F/U 4 months

## 2018-05-07 NOTE — Progress Notes (Signed)
   Subjective:    Patient ID: Teresa Soto, female    DOB: [birth year], 62 y.o.   MRN: [id number]  Patient presents for CPE (is not fasting)  Pt here for CPE, medications and history reviewed Continue to follow with neurology for her history of stroke. Follow-up with neurosurgeon Dr. Carloyn Manner because of her back pain.  Planning for back surgery at the end of August she is supposed to have laminectomy to help decompress the disc around L3-L5. Taking gabapentin    Diabetes mellitus she has had recent A1c performed in May which was 6.5% she is following with dietitian.  Continued on her medications at that visit. Cholesterol is much improved LDL at goal 95  History of stroke- now on aricept for memory changes, given by neurology   Osteoporosis -she is due for repeat bone density last done 2 years ago  Immunizations- TDAP UTD, allergy with influenza/ PNA ?  Colonoscopy due  Mammogram up-to-date  Wears hearing aides   Tinea is flared all over, request refill on her medication   Cancelled her dermatology appt- due to other appt and surgery  Review Of Systems:  GEN- denies fatigue, fever, weight loss,weakness, recent illness HEENT- denies eye drainage, change in vision, nasal discharge, CVS- denies chest pain, palpitations RESP- denies SOB, cough, wheeze ABD- denies N/V, change in stools, abd pain GU- denies dysuria, hematuria, dribbling, incontinence MSK-+joint pain, muscle aches, injury Neuro- denies headache, dizziness, syncope, seizure activity       Objective:    BP 128/64   Pulse 78   Temp 98.4 F (36.9 C) (Oral)   Resp 16   Ht 5\' 2"  (1.575 m)   Wt 172 lb (78 kg)   SpO2 98%   BMI 31.46 kg/m  GEN- NAD, alert and oriented x3 HEENT- PERRL, EOMI, non injected sclera, pink conjunctiva, MMM, oropharynx clear, TM clear bilat, wears right hearing aide Neck- Supple, no thyromegaly CVS- RRR, no murmur RESP-CTAB ABD-NABS,soft,NT,ND Skin- erythematous plaques across back,  chest, down arms/upper abdomen  EXT- No edema Pulses- Radial, DP- 2+  Depression screen NEGATIVE       Assessment & Plan:      Problem List Items Addressed This Visit      Unprioritized   Tinea versicolor    Treat with diflucan and nizoral       Other Visit Diagnoses    Routine general medical examination at a health care facility    -  Primary   CPE done, recent fasting labs, no immunizations due to previous reactions with shots. Plan for Bone Density this winter, will schedule then due to upcoming back surgery   Colon cancer screening       Given info for cologuard      Note: This dictation was prepared with Dragon dictation along with smaller phrase technology. Any transcriptional errors that result from this process are unintentional.

## 2018-05-08 ENCOUNTER — Other Ambulatory Visit: Payer: Self-pay | Admitting: *Deleted

## 2018-05-08 MED ORDER — KETOCONAZOLE 2 % EX CREA: 1.0000 "application " | TOPICAL_CREAM | Freq: Every day | CUTANEOUS | 0 refills | Status: DC

## 2018-05-27 DIAGNOSIS — Z6832 Body mass index (BMI) 32.0-32.9, adult: Secondary | ICD-10-CM | POA: Diagnosis not present

## 2018-05-27 DIAGNOSIS — M1990 Unspecified osteoarthritis, unspecified site: Secondary | ICD-10-CM | POA: Diagnosis not present

## 2018-05-27 DIAGNOSIS — Z79899 Other long term (current) drug therapy: Secondary | ICD-10-CM | POA: Diagnosis not present

## 2018-05-27 DIAGNOSIS — Z7951 Long term (current) use of inhaled steroids: Secondary | ICD-10-CM | POA: Diagnosis not present

## 2018-05-27 DIAGNOSIS — J449 Chronic obstructive pulmonary disease, unspecified: Secondary | ICD-10-CM | POA: Diagnosis not present

## 2018-05-27 DIAGNOSIS — M48061 Spinal stenosis, lumbar region without neurogenic claudication: Secondary | ICD-10-CM | POA: Diagnosis not present

## 2018-05-27 DIAGNOSIS — Z7984 Long term (current) use of oral hypoglycemic drugs: Secondary | ICD-10-CM | POA: Diagnosis not present

## 2018-05-27 DIAGNOSIS — Z87442 Personal history of urinary calculi: Secondary | ICD-10-CM | POA: Diagnosis not present

## 2018-05-27 DIAGNOSIS — M797 Fibromyalgia: Secondary | ICD-10-CM | POA: Diagnosis not present

## 2018-05-27 DIAGNOSIS — M4726 Other spondylosis with radiculopathy, lumbar region: Secondary | ICD-10-CM | POA: Diagnosis not present

## 2018-05-27 DIAGNOSIS — Z01818 Encounter for other preprocedural examination: Secondary | ICD-10-CM | POA: Diagnosis not present

## 2018-05-27 DIAGNOSIS — I10 Essential (primary) hypertension: Secondary | ICD-10-CM | POA: Diagnosis not present

## 2018-05-27 DIAGNOSIS — Z8673 Personal history of transient ischemic attack (TIA), and cerebral infarction without residual deficits: Secondary | ICD-10-CM | POA: Diagnosis not present

## 2018-05-27 DIAGNOSIS — Z7982 Long term (current) use of aspirin: Secondary | ICD-10-CM | POA: Diagnosis not present

## 2018-05-27 DIAGNOSIS — E669 Obesity, unspecified: Secondary | ICD-10-CM | POA: Diagnosis not present

## 2018-05-27 DIAGNOSIS — E119 Type 2 diabetes mellitus without complications: Secondary | ICD-10-CM | POA: Diagnosis not present

## 2018-05-27 DIAGNOSIS — J302 Other seasonal allergic rhinitis: Secondary | ICD-10-CM | POA: Diagnosis not present

## 2018-05-29 DIAGNOSIS — E119 Type 2 diabetes mellitus without complications: Secondary | ICD-10-CM | POA: Diagnosis not present

## 2018-05-29 DIAGNOSIS — Z7984 Long term (current) use of oral hypoglycemic drugs: Secondary | ICD-10-CM | POA: Diagnosis not present

## 2018-05-29 DIAGNOSIS — M48061 Spinal stenosis, lumbar region without neurogenic claudication: Secondary | ICD-10-CM | POA: Diagnosis not present

## 2018-05-29 DIAGNOSIS — I10 Essential (primary) hypertension: Secondary | ICD-10-CM | POA: Diagnosis not present

## 2018-05-29 DIAGNOSIS — Z6832 Body mass index (BMI) 32.0-32.9, adult: Secondary | ICD-10-CM | POA: Diagnosis not present

## 2018-05-29 DIAGNOSIS — M1990 Unspecified osteoarthritis, unspecified site: Secondary | ICD-10-CM | POA: Diagnosis not present

## 2018-05-29 DIAGNOSIS — Z79899 Other long term (current) drug therapy: Secondary | ICD-10-CM | POA: Diagnosis not present

## 2018-05-29 DIAGNOSIS — Z7982 Long term (current) use of aspirin: Secondary | ICD-10-CM | POA: Diagnosis not present

## 2018-05-29 DIAGNOSIS — Z7951 Long term (current) use of inhaled steroids: Secondary | ICD-10-CM | POA: Diagnosis not present

## 2018-05-29 DIAGNOSIS — M5136 Other intervertebral disc degeneration, lumbar region: Secondary | ICD-10-CM | POA: Diagnosis not present

## 2018-05-29 DIAGNOSIS — Z8673 Personal history of transient ischemic attack (TIA), and cerebral infarction without residual deficits: Secondary | ICD-10-CM | POA: Diagnosis not present

## 2018-05-29 DIAGNOSIS — M797 Fibromyalgia: Secondary | ICD-10-CM | POA: Diagnosis not present

## 2018-05-29 DIAGNOSIS — Z87442 Personal history of urinary calculi: Secondary | ICD-10-CM | POA: Diagnosis not present

## 2018-05-29 DIAGNOSIS — M4726 Other spondylosis with radiculopathy, lumbar region: Secondary | ICD-10-CM | POA: Diagnosis not present

## 2018-05-29 DIAGNOSIS — M47816 Spondylosis without myelopathy or radiculopathy, lumbar region: Secondary | ICD-10-CM | POA: Diagnosis not present

## 2018-05-29 DIAGNOSIS — J449 Chronic obstructive pulmonary disease, unspecified: Secondary | ICD-10-CM | POA: Diagnosis not present

## 2018-05-29 DIAGNOSIS — E669 Obesity, unspecified: Secondary | ICD-10-CM | POA: Diagnosis not present

## 2018-05-29 DIAGNOSIS — J302 Other seasonal allergic rhinitis: Secondary | ICD-10-CM | POA: Diagnosis not present

## 2018-05-29 DIAGNOSIS — M479 Spondylosis, unspecified: Secondary | ICD-10-CM | POA: Diagnosis not present

## 2018-05-30 DIAGNOSIS — M797 Fibromyalgia: Secondary | ICD-10-CM | POA: Diagnosis not present

## 2018-05-30 DIAGNOSIS — Z8673 Personal history of transient ischemic attack (TIA), and cerebral infarction without residual deficits: Secondary | ICD-10-CM | POA: Diagnosis not present

## 2018-05-30 DIAGNOSIS — J449 Chronic obstructive pulmonary disease, unspecified: Secondary | ICD-10-CM | POA: Diagnosis not present

## 2018-05-30 DIAGNOSIS — Z87442 Personal history of urinary calculi: Secondary | ICD-10-CM | POA: Diagnosis not present

## 2018-05-30 DIAGNOSIS — Z79899 Other long term (current) drug therapy: Secondary | ICD-10-CM | POA: Diagnosis not present

## 2018-05-30 DIAGNOSIS — E119 Type 2 diabetes mellitus without complications: Secondary | ICD-10-CM | POA: Diagnosis not present

## 2018-05-30 DIAGNOSIS — M1990 Unspecified osteoarthritis, unspecified site: Secondary | ICD-10-CM | POA: Diagnosis not present

## 2018-05-30 DIAGNOSIS — M4726 Other spondylosis with radiculopathy, lumbar region: Secondary | ICD-10-CM | POA: Diagnosis not present

## 2018-05-30 DIAGNOSIS — J302 Other seasonal allergic rhinitis: Secondary | ICD-10-CM | POA: Diagnosis not present

## 2018-05-30 DIAGNOSIS — Z7982 Long term (current) use of aspirin: Secondary | ICD-10-CM | POA: Diagnosis not present

## 2018-05-30 DIAGNOSIS — E669 Obesity, unspecified: Secondary | ICD-10-CM | POA: Diagnosis not present

## 2018-05-30 DIAGNOSIS — Z7984 Long term (current) use of oral hypoglycemic drugs: Secondary | ICD-10-CM | POA: Diagnosis not present

## 2018-05-30 DIAGNOSIS — Z7951 Long term (current) use of inhaled steroids: Secondary | ICD-10-CM | POA: Diagnosis not present

## 2018-05-30 DIAGNOSIS — I10 Essential (primary) hypertension: Secondary | ICD-10-CM | POA: Diagnosis not present

## 2018-05-30 DIAGNOSIS — Z6832 Body mass index (BMI) 32.0-32.9, adult: Secondary | ICD-10-CM | POA: Diagnosis not present

## 2018-05-30 DIAGNOSIS — M48061 Spinal stenosis, lumbar region without neurogenic claudication: Secondary | ICD-10-CM | POA: Diagnosis not present

## 2018-06-06 ENCOUNTER — Other Ambulatory Visit: Payer: Self-pay | Admitting: Family Medicine

## 2018-06-26 ENCOUNTER — Other Ambulatory Visit: Payer: Self-pay | Admitting: *Deleted

## 2018-06-26 DIAGNOSIS — I10 Essential (primary) hypertension: Secondary | ICD-10-CM

## 2018-06-26 DIAGNOSIS — Z0000 Encounter for general adult medical examination without abnormal findings: Secondary | ICD-10-CM

## 2018-06-26 DIAGNOSIS — E782 Mixed hyperlipidemia: Secondary | ICD-10-CM

## 2018-06-26 DIAGNOSIS — E119 Type 2 diabetes mellitus without complications: Secondary | ICD-10-CM

## 2018-07-26 ENCOUNTER — Other Ambulatory Visit (HOSPITAL_COMMUNITY): Payer: Self-pay | Admitting: Neurosurgery

## 2018-07-26 DIAGNOSIS — M47816 Spondylosis without myelopathy or radiculopathy, lumbar region: Secondary | ICD-10-CM

## 2018-08-02 ENCOUNTER — Ambulatory Visit (HOSPITAL_COMMUNITY)
Admission: RE | Admit: 2018-08-02 | Discharge: 2018-08-02 | Disposition: A | Discharge status: Home / Self Care | Payer: BLUE CROSS/BLUE SHIELD | Source: Ambulatory Visit | Attending: Neurosurgery | Admitting: Neurosurgery

## 2018-08-02 DIAGNOSIS — I700 Atherosclerosis of aorta: Secondary | ICD-10-CM | POA: Diagnosis not present

## 2018-08-02 DIAGNOSIS — M5126 Other intervertebral disc displacement, lumbar region: Secondary | ICD-10-CM | POA: Diagnosis not present

## 2018-08-02 DIAGNOSIS — N200 Calculus of kidney: Secondary | ICD-10-CM | POA: Insufficient documentation

## 2018-08-02 DIAGNOSIS — M47816 Spondylosis without myelopathy or radiculopathy, lumbar region: Secondary | ICD-10-CM | POA: Diagnosis not present

## 2018-08-05 ENCOUNTER — Other Ambulatory Visit: Payer: BLUE CROSS/BLUE SHIELD

## 2018-08-05 ENCOUNTER — Telehealth: Payer: Self-pay | Admitting: Family Medicine

## 2018-08-05 DIAGNOSIS — E119 Type 2 diabetes mellitus without complications: Secondary | ICD-10-CM

## 2018-08-05 DIAGNOSIS — E782 Mixed hyperlipidemia: Secondary | ICD-10-CM

## 2018-08-05 DIAGNOSIS — Z0000 Encounter for general adult medical examination without abnormal findings: Secondary | ICD-10-CM

## 2018-08-05 DIAGNOSIS — I10 Essential (primary) hypertension: Secondary | ICD-10-CM | POA: Diagnosis not present

## 2018-08-06 ENCOUNTER — Encounter: Payer: Self-pay | Admitting: *Deleted

## 2018-08-06 LAB — COMPLETE METABOLIC PANEL WITH GFR
AG RATIO: 1.5 (calc) (ref 1.0–2.5)
ALKALINE PHOSPHATASE (APISO): 45 U/L (ref 33–130)
ALT: 13 U/L (ref 6–29)
AST: 17 U/L (ref 10–35)
Albumin: 4.2 g/dL (ref 3.6–5.1)
BILIRUBIN TOTAL: 0.4 mg/dL (ref 0.2–1.2)
BUN/Creatinine Ratio: 20 (calc) (ref 6–22)
BUN: 22 mg/dL (ref 7–25)
CHLORIDE: 105 mmol/L (ref 98–110)
CO2: 27 mmol/L (ref 20–32)
Calcium: 10.5 mg/dL — ABNORMAL HIGH (ref 8.6–10.4)
Creat: 1.11 mg/dL — ABNORMAL HIGH (ref 0.50–0.99)
GFR, EST AFRICAN AMERICAN: 62 mL/min/{1.73_m2} (ref >=60)
GFR, Est Non African American: 54 mL/min/{1.73_m2} — ABNORMAL LOW (ref >=60)
Globulin: 2.8 g/dL (calc) (ref 1.9–3.7)
Glucose, Bld: 90 mg/dL (ref 65–99)
POTASSIUM: 5 mmol/L (ref 3.5–5.3)
Sodium: 140 mmol/L (ref 135–146)
Total Protein: 7 g/dL (ref 6.1–8.1)

## 2018-08-06 LAB — CBC WITH DIFFERENTIAL/PLATELET
BASOS PCT: 0.8 %
Basophils Absolute: 49 cells/uL (ref 0–200)
Eosinophils Absolute: 220 cells/uL (ref 15–500)
Eosinophils Relative: 3.6 %
HEMATOCRIT: 41.8 % (ref 35.0–45.0)
HEMOGLOBIN: 13.5 g/dL (ref 11.7–15.5)
LYMPHS ABS: 2330 {cells}/uL (ref 850–3900)
MCH: 27.1 pg (ref 27.0–33.0)
MCHC: 32.3 g/dL (ref 32.0–36.0)
MCV: 83.9 fL (ref 80.0–100.0)
MPV: 9.6 fL (ref 7.5–12.5)
Monocytes Relative: 8.1 %
NEUTROS ABS: 3007 {cells}/uL (ref 1500–7800)
Neutrophils Relative %: 49.3 %
Platelets: 410 10*3/uL — ABNORMAL HIGH (ref 140–400)
RBC: 4.98 10*6/uL (ref 3.80–5.10)
RDW: 14.1 % (ref 11.0–15.0)
Total Lymphocyte: 38.2 %
WBC: 6.1 10*3/uL (ref 3.8–10.8)
WBCMIX: 494 {cells}/uL (ref 200–950)

## 2018-08-06 LAB — LIPID PANEL
CHOL/HDL RATIO: 4.5 (calc) (ref <5.0)
Cholesterol: 187 mg/dL (ref <200)
HDL: 42 mg/dL — ABNORMAL LOW (ref >50)
LDL Cholesterol (Calc): 121 mg/dL (calc) — ABNORMAL HIGH
NON-HDL CHOLESTEROL (CALC): 145 mg/dL — AB (ref <130)
TRIGLYCERIDES: 125 mg/dL (ref <150)

## 2018-08-06 LAB — MICROALBUMIN / CREATININE URINE RATIO
CREATININE, URINE: 81 mg/dL (ref 20–275)
MICROALB UR: 0.8 mg/dL
MICROALB/CREAT RATIO: 10 ug/mg{creat} (ref <30)

## 2018-08-06 LAB — HEMOGLOBIN A1C
HEMOGLOBIN A1C: 6.7 %{Hb} — AB (ref <5.7)
MEAN PLASMA GLUCOSE: 146 (calc)
eAG (mmol/L): 8.1 (calc)

## 2018-08-07 NOTE — Telephone Encounter (Signed)
Error

## 2018-08-21 DIAGNOSIS — M5126 Other intervertebral disc displacement, lumbar region: Secondary | ICD-10-CM | POA: Diagnosis not present

## 2018-08-21 DIAGNOSIS — R262 Difficulty in walking, not elsewhere classified: Secondary | ICD-10-CM | POA: Diagnosis not present

## 2018-08-26 DIAGNOSIS — M5126 Other intervertebral disc displacement, lumbar region: Secondary | ICD-10-CM | POA: Diagnosis not present

## 2018-08-26 DIAGNOSIS — R262 Difficulty in walking, not elsewhere classified: Secondary | ICD-10-CM | POA: Diagnosis not present

## 2018-08-28 ENCOUNTER — Other Ambulatory Visit: Payer: Self-pay | Admitting: Family Medicine

## 2018-09-02 DIAGNOSIS — R262 Difficulty in walking, not elsewhere classified: Secondary | ICD-10-CM | POA: Diagnosis not present

## 2018-09-02 DIAGNOSIS — M5126 Other intervertebral disc displacement, lumbar region: Secondary | ICD-10-CM | POA: Diagnosis not present

## 2018-09-09 DIAGNOSIS — M545 Low back pain: Secondary | ICD-10-CM | POA: Diagnosis not present

## 2018-09-09 DIAGNOSIS — R262 Difficulty in walking, not elsewhere classified: Secondary | ICD-10-CM | POA: Diagnosis not present

## 2018-09-09 DIAGNOSIS — M5126 Other intervertebral disc displacement, lumbar region: Secondary | ICD-10-CM | POA: Diagnosis not present

## 2018-09-11 DIAGNOSIS — M545 Low back pain: Secondary | ICD-10-CM | POA: Diagnosis not present

## 2018-09-11 DIAGNOSIS — R262 Difficulty in walking, not elsewhere classified: Secondary | ICD-10-CM | POA: Diagnosis not present

## 2018-09-11 DIAGNOSIS — M5126 Other intervertebral disc displacement, lumbar region: Secondary | ICD-10-CM | POA: Diagnosis not present

## 2018-09-16 DIAGNOSIS — M545 Low back pain: Secondary | ICD-10-CM | POA: Diagnosis not present

## 2018-09-16 DIAGNOSIS — R262 Difficulty in walking, not elsewhere classified: Secondary | ICD-10-CM | POA: Diagnosis not present

## 2018-09-16 DIAGNOSIS — M5126 Other intervertebral disc displacement, lumbar region: Secondary | ICD-10-CM | POA: Diagnosis not present

## 2018-09-17 DIAGNOSIS — M47816 Spondylosis without myelopathy or radiculopathy, lumbar region: Secondary | ICD-10-CM | POA: Diagnosis not present

## 2018-09-17 DIAGNOSIS — Z09 Encounter for follow-up examination after completed treatment for conditions other than malignant neoplasm: Secondary | ICD-10-CM | POA: Diagnosis not present

## 2018-09-17 DIAGNOSIS — M5126 Other intervertebral disc displacement, lumbar region: Secondary | ICD-10-CM | POA: Diagnosis not present

## 2018-09-18 DIAGNOSIS — M545 Low back pain: Secondary | ICD-10-CM | POA: Diagnosis not present

## 2018-09-18 DIAGNOSIS — M5126 Other intervertebral disc displacement, lumbar region: Secondary | ICD-10-CM | POA: Diagnosis not present

## 2018-09-18 DIAGNOSIS — R262 Difficulty in walking, not elsewhere classified: Secondary | ICD-10-CM | POA: Diagnosis not present

## 2018-09-23 DIAGNOSIS — M5126 Other intervertebral disc displacement, lumbar region: Secondary | ICD-10-CM | POA: Diagnosis not present

## 2018-09-23 DIAGNOSIS — M545 Low back pain: Secondary | ICD-10-CM | POA: Diagnosis not present

## 2018-09-23 DIAGNOSIS — R262 Difficulty in walking, not elsewhere classified: Secondary | ICD-10-CM | POA: Diagnosis not present

## 2018-09-25 DIAGNOSIS — R262 Difficulty in walking, not elsewhere classified: Secondary | ICD-10-CM | POA: Diagnosis not present

## 2018-09-25 DIAGNOSIS — M545 Low back pain: Secondary | ICD-10-CM | POA: Diagnosis not present

## 2018-09-25 DIAGNOSIS — M5126 Other intervertebral disc displacement, lumbar region: Secondary | ICD-10-CM | POA: Diagnosis not present

## 2018-09-30 DIAGNOSIS — M545 Low back pain: Secondary | ICD-10-CM | POA: Diagnosis not present

## 2018-09-30 DIAGNOSIS — M5126 Other intervertebral disc displacement, lumbar region: Secondary | ICD-10-CM | POA: Diagnosis not present

## 2018-09-30 DIAGNOSIS — R262 Difficulty in walking, not elsewhere classified: Secondary | ICD-10-CM | POA: Diagnosis not present

## 2018-10-21 ENCOUNTER — Other Ambulatory Visit: Payer: Self-pay | Admitting: Family Medicine

## 2018-11-04 ENCOUNTER — Ambulatory Visit (INDEPENDENT_AMBULATORY_CARE_PROVIDER_SITE_OTHER): Payer: BLUE CROSS/BLUE SHIELD | Admitting: Otolaryngology

## 2018-11-04 DIAGNOSIS — H903 Sensorineural hearing loss, bilateral: Secondary | ICD-10-CM | POA: Diagnosis not present

## 2018-11-04 DIAGNOSIS — H8101 Meniere's disease, right ear: Secondary | ICD-10-CM | POA: Diagnosis not present

## 2018-11-07 ENCOUNTER — Other Ambulatory Visit: Payer: Self-pay | Admitting: Family Medicine

## 2018-11-18 ENCOUNTER — Ambulatory Visit: Payer: BLUE CROSS/BLUE SHIELD | Admitting: Orthopedic Surgery

## 2018-11-18 ENCOUNTER — Encounter: Payer: Self-pay | Admitting: Orthopedic Surgery

## 2018-11-18 ENCOUNTER — Ambulatory Visit (INDEPENDENT_AMBULATORY_CARE_PROVIDER_SITE_OTHER): Payer: BLUE CROSS/BLUE SHIELD

## 2018-11-18 ENCOUNTER — Other Ambulatory Visit: Payer: Self-pay | Admitting: Family Medicine

## 2018-11-18 VITALS — BP 162/88 | HR 80 | Ht 62.0 in | Wt 173.0 lb

## 2018-11-18 DIAGNOSIS — M79672 Pain in left foot: Secondary | ICD-10-CM

## 2018-11-18 DIAGNOSIS — M84375A Stress fracture, left foot, initial encounter for fracture: Secondary | ICD-10-CM

## 2018-11-18 NOTE — Progress Notes (Signed)
NEW PROBLEM  OFFICE VISIT  Chief Complaint  Patient presents with  . Foot Pain    Left foot    63 years old several month history of pain in the left foot at the base of the fifth metatarsal TMT joint.  The pain started at the metatarsal phalangeal joint but is now localized proximally.  Its dull it aches its moderate to severe it hurts when she is walking is relieved at rest.   Review of Systems  Constitutional: Negative for chills and fever.  Skin: Negative.  Negative for rash.     Past Medical History:  Diagnosis Date  . Arthritis   . 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 Asthma   . Chronic back pain   . Chronic constipation   . Depression   . Heart murmur    in childhood  . History of bronchitis   . History of urinary tract infection   . Hypertension   . Imbalance   . Meniere disease   . Neuropathic pain   . Parathyroid adenoma   . Poor fine motor skills    secondary to CVA per right side   . Pre-diabetes   . PTSD (post-traumatic stress disorder)   . Restless legs syndrome 2007 approx  . Seasonal allergies   . Shortness of breath dyspnea    coldness   . Stroke ([place])   . Vertigo     Past Surgical History:  Procedure Laterality Date  . BACK SURGERY    . CESAREAN SECTION     x 2  . PARATHYROIDECTOMY N/A 06/27/2016   Procedure: MINIMALLY INVASIVE PARATHYROIDECTOMY;  Surgeon: Greer Pickerel, MD;  Location: [facility];  Service: General;  Laterality: N/A;  . SHOULDER ARTHROSCOPY W/ ROTATOR CUFF REPAIR     to right shoulder  . SPINE SURGERY  12/2010   ruptd L1 L2 , Dr Carloyn Manner  . TONSILLECTOMY    . TOTAL KNEE ARTHROPLASTY Right 08/12/2014   Procedure: RIGHT TOTAL KNEE ARTHROPLASTY;  Surgeon: Carole Civil, MD;  Location: [facility];  Service: Orthopedics;  Laterality: Right;  . TUBAL LIGATION      Family History  Problem Relation Age of Onset  . Dementia Paternal Grandmother    Social History   Tobacco Use  . Smoking status: Former Smoker    Packs/day: 2.00    Years: 6.00    Pack years: 12.00    Types: Cigarettes    Last attempt to quit: 10/09/1982    Years since quitting: 36.1  . Smokeless tobacco: Never Used  . Tobacco comment: quit in 1984  Substance Use Topics  . Alcohol use: Yes    Comment: occasionally  . Drug use: No    Allergies  Allergen Reactions  . Other Nausea And Vomiting    Peppers  . Pneumococcal Vaccines     Had rash, given both flu and PNA at same time   . Statins   . Influenza Vaccines Rash    Current Meds  Medication Sig  . Albuterol Sulfate (PROAIR RESPICLICK) 409 (90 Base) MCG/ACT AEPB Inhale 2 puffs into the lungs every 4 (four) hours as needed. For shortness of breath.  . donepezil (ARICEPT) 5 MG tablet   . escitalopram (LEXAPRO) 20 MG tablet TAKE 1 TABLET BY MOUTH ONCE DAILY  . fenofibrate (TRICOR) 145 MG tablet TAKE 1 TABLET BY MOUTH ONCE DAILY  . fluticasone (FLONASE) 50 MCG/ACT nasal spray Place into both nostrils daily.  Marland Kitchen gabapentin (NEURONTIN) 300 MG capsule TAKE  2 CAPSULE BY MOUTH AT BEDTIME  . JARDIANCE 10 MG TABS tablet TAKE 1 TABLET BY MOUTH ONCE DAILY  . ketoconazole (NIZORAL) 2 % cream Apply 1 application topically daily.  Marland Kitchen lisinopril (PRINIVIL,ZESTRIL) 10 MG tablet TAKE 1 TABLET BY MOUTH ONCE DAILY  . montelukast (SINGULAIR) 10 MG tablet TAKE 1 TABLET BY MOUTH ONCE DAILY IN THE EVENING  . ondansetron (ZOFRAN) 4 MG tablet TAKE 1 TABLET BY MOUTH EVERY 6 HOURS  . polyethylene glycol (MIRALAX / GLYCOLAX) packet Take 17 g by mouth daily. (Patient taking differently: Take 17 g by mouth every other day. )  . pramipexole (MIRAPEX) 0.25 MG tablet TAKE 1 TABLET BY MOUTH ONCE DAILY WITH BREAKFAST (Patient taking differently: TAKE 1 TABLET BY MOUTH ONCE DAILY AT BEDTIME)    BP (!) 162/88   Pulse 80   Ht 5\' 2"  (1.575 m)   Wt 173 lb (78.5 kg)   BMI 31.64 kg/m   Physical Exam Vitals signs reviewed.  Constitutional:      Appearance: Normal appearance. She is well-developed.  Neurological:     Mental  Status: She is alert and oriented to person, place, and time.     Gait: Gait abnormal.     Comments: CANE NEEDED   Psychiatric:        Attention and Perception: Attention normal.        Mood and Affect: Mood and affect normal.        Speech: Speech normal.        Behavior: Behavior normal.        Thought Content: Thought content normal.        Judgment: Judgment normal.     Ortho Exam  FOOT - LEFT: No gross foot deformity.  She is tender over the tarsometatarsal joint on the dorsum of the fifth metatarsal nontender at the peroneal tendon insertion. Ankle range of motion is normal tarsometatarsal joint stable metatarsophalangeal joint normal range of motion Muscle tone normal no atrophy Skin is intact Pulses are good Sensory exam is normal   Right foot nontender range of motion normal no instability muscle tone normal skin intact neurovascular exam normal  MEDICAL DECISION SECTION  Xrays were done at [facility]  My independent reading of xrays:  X-ray shows a stress fracture in the metaphyseal diaphyseal region of the left foot with partial healing  Encounter Diagnoses  Name Primary?  . Left foot pain   . Stress fracture of metatarsal bone of left foot, initial encounter Yes    PLAN: (Rx., injectx, surgery, frx, mri/ct) Recommend short cam walker weight-bear as tolerated follow-up in 6 weeks repeat x-ray should be known patient has history of parathyroid disease and hypercalcemia with low vitamin D  No orders of the defined types were placed in this encounter.   Arther Abbott, MD  11/18/2018 11:48 AM

## 2018-12-17 ENCOUNTER — Other Ambulatory Visit: Payer: Self-pay | Admitting: Family Medicine

## 2018-12-19 ENCOUNTER — Other Ambulatory Visit: Payer: Self-pay | Admitting: Family Medicine

## 2018-12-24 ENCOUNTER — Other Ambulatory Visit: Payer: Self-pay | Admitting: Family Medicine

## 2018-12-30 ENCOUNTER — Ambulatory Visit (INDEPENDENT_AMBULATORY_CARE_PROVIDER_SITE_OTHER): Payer: BLUE CROSS/BLUE SHIELD

## 2018-12-30 ENCOUNTER — Other Ambulatory Visit: Payer: Self-pay

## 2018-12-30 ENCOUNTER — Ambulatory Visit (INDEPENDENT_AMBULATORY_CARE_PROVIDER_SITE_OTHER): Payer: BLUE CROSS/BLUE SHIELD | Admitting: Orthopedic Surgery

## 2018-12-30 DIAGNOSIS — M84375D Stress fracture, left foot, subsequent encounter for fracture with routine healing: Secondary | ICD-10-CM | POA: Diagnosis not present

## 2018-12-30 DIAGNOSIS — M84376D Stress fracture, unspecified foot, subsequent encounter for fracture with routine healing: Secondary | ICD-10-CM | POA: Insufficient documentation

## 2018-12-30 NOTE — Progress Notes (Signed)
28470 PR CLOSED RX METATARSAL FX 99214 PR OFFICE OUTPATIENT VISIT 25 MINUTES  11/18/2018  Chief Complaint  Patient presents with  . Foot Pain    February 10 left foot fracture follow-up    63 year old female presented with a stress fracture fifth metatarsal left foot.  Treated with a cam walker  Comes back in for x-ray  X-ray shows persistent fracture line with some callus forming at the fracture site  Symptoms: pain still in the lateral metatarsal #5 left foot  CAM Gilford Rile is bothering her back and hips so we can modify the way she walks with  Plan Cam walker use as needed X-rays in 6 weeks

## 2018-12-30 NOTE — Patient Instructions (Signed)
Use the walker as needed when up for more than an hour otherwise you can walk around the house and your regular shoes or barefoot

## 2019-01-09 ENCOUNTER — Other Ambulatory Visit: Payer: Self-pay | Admitting: Family Medicine

## 2019-01-17 ENCOUNTER — Other Ambulatory Visit: Payer: Self-pay | Admitting: Family Medicine

## 2019-01-29 ENCOUNTER — Other Ambulatory Visit: Payer: Self-pay | Admitting: Family Medicine

## 2019-02-04 ENCOUNTER — Other Ambulatory Visit: Payer: Self-pay | Admitting: *Deleted

## 2019-02-04 MED ORDER — EMPAGLIFLOZIN 10 MG PO TABS: 10.0000 mg | ORAL_TABLET | Freq: Every day | ORAL | 0 refills | Status: DC

## 2019-02-04 MED ORDER — GABAPENTIN 300 MG PO CAPS: ORAL_CAPSULE | ORAL | 0 refills | Status: DC

## 2019-02-04 MED ORDER — ESCITALOPRAM OXALATE 20 MG PO TABS: 20.0000 mg | ORAL_TABLET | Freq: Every day | ORAL | 0 refills | Status: DC

## 2019-02-04 MED ORDER — PRAMIPEXOLE DIHYDROCHLORIDE 0.25 MG PO TABS: ORAL_TABLET | ORAL | 0 refills | Status: DC

## 2019-02-04 MED ORDER — FENOFIBRATE 145 MG PO TABS: 145.0000 mg | ORAL_TABLET | Freq: Every day | ORAL | 0 refills | Status: DC

## 2019-02-04 MED ORDER — LISINOPRIL 10 MG PO TABS: 10.0000 mg | ORAL_TABLET | Freq: Every day | ORAL | 2 refills | Status: DC

## 2019-02-04 MED ORDER — MONTELUKAST SODIUM 10 MG PO TABS: 10.0000 mg | ORAL_TABLET | Freq: Every evening | ORAL | 0 refills | Status: DC

## 2019-02-04 MED ORDER — ONDANSETRON HCL 4 MG PO TABS: 4.0000 mg | ORAL_TABLET | Freq: Four times a day (QID) | ORAL | 0 refills | Status: DC

## 2019-02-04 MED ORDER — NAPROXEN 500 MG PO TABS: ORAL_TABLET | ORAL | 0 refills | Status: DC

## 2019-02-10 ENCOUNTER — Ambulatory Visit (INDEPENDENT_AMBULATORY_CARE_PROVIDER_SITE_OTHER): Payer: BLUE CROSS/BLUE SHIELD

## 2019-02-10 ENCOUNTER — Ambulatory Visit (INDEPENDENT_AMBULATORY_CARE_PROVIDER_SITE_OTHER): Payer: BLUE CROSS/BLUE SHIELD | Admitting: Orthopedic Surgery

## 2019-02-10 ENCOUNTER — Encounter: Payer: Self-pay | Admitting: Orthopedic Surgery

## 2019-02-10 ENCOUNTER — Other Ambulatory Visit: Payer: Self-pay

## 2019-02-10 VITALS — BP 156/91 | HR 87 | Temp 98.4°F | Ht 62.0 in | Wt 173.0 lb

## 2019-02-10 DIAGNOSIS — M84375D Stress fracture, left foot, subsequent encounter for fracture with routine healing: Secondary | ICD-10-CM

## 2019-02-10 DIAGNOSIS — M6530 Trigger finger, unspecified finger: Secondary | ICD-10-CM

## 2019-02-10 NOTE — Progress Notes (Signed)
Chief Complaint  Patient presents with  . Foot Pain    Stress fracture  28470 PR CLOSED RX METATARSAL FX 99214 PR OFFICE OUTPATIENT VISIT 25 MINUTES  11/18/2018   Fracture care follow-up x-ray report   3 views left foot  Left fifth metatarsal stress fracture  X-ray shows near complete resolution of the fracture line with no malalignment  Impression depending on clinical symptoms fracture looks healed  She says she can walk in sneakers now just a little discomfort.  Minimal palpable tenderness no deformity at the fracture site  Recommend weight-bear as tolerated activities as tolerated no follow-up needed  New complaint recurrent triggering right ring finger s  Chief complaint pain right ring finger with triggering Right ring finger requires manual manipulation to straighten to prior injections did well  Review of systems pain with activities and rest pain left thumb at the basilar joint  Exam well-developed well-nourished lady grooming hygiene intact mood affect normal triggering right ring finger requires manual manipulation tenderness over the A1 pulley otherwise normal passive range of motion no instability flexor tendon strength normal skin clean color capillary refill normal sensation normal right hand    Trigger finger injection  Diagnosis  Trigger finger right eing  Procedure injection A1 pulley Medications lidocaine 1% 1 mL and Depo-Medrol 40 mg 1 mL Skin prep alcohol and ethyl chloride Verbal consent was obtained Timeout confirmed the injection site  After cleaning the skin with alcohol and anesthetizing the skin with ethyl chloride the A1 pulley was palpated and the injection was performed without complication

## 2019-02-19 ENCOUNTER — Other Ambulatory Visit (HOSPITAL_COMMUNITY): Payer: Self-pay | Admitting: Family Medicine

## 2019-02-19 DIAGNOSIS — Z1231 Encounter for screening mammogram for malignant neoplasm of breast: Secondary | ICD-10-CM

## 2019-02-26 ENCOUNTER — Telehealth: Payer: Self-pay | Admitting: *Deleted

## 2019-02-26 MED ORDER — GABAPENTIN 300 MG PO CAPS: 300.0000 mg | ORAL_CAPSULE | Freq: Two times a day (BID) | ORAL | 0 refills | Status: DC

## 2019-02-26 NOTE — Telephone Encounter (Signed)
Okay to change and re-order

## 2019-02-26 NOTE — Telephone Encounter (Signed)
Prescription sent to pharmacy with direction change.

## 2019-02-26 NOTE — Telephone Encounter (Signed)
Received call from pharmacy.   Reports that patient has prescription for Gabapentin 300mg  (2) cap PO Q HS. Reports that patient is requesting directions to be changed to (1) cap PO BID.   Ok to change?

## 2019-02-27 ENCOUNTER — Ambulatory Visit (HOSPITAL_COMMUNITY): Payer: BLUE CROSS/BLUE SHIELD

## 2019-03-05 ENCOUNTER — Ambulatory Visit (HOSPITAL_COMMUNITY): Payer: BLUE CROSS/BLUE SHIELD

## 2019-03-07 ENCOUNTER — Encounter: Payer: Self-pay | Admitting: Orthopedic Surgery

## 2019-03-07 ENCOUNTER — Other Ambulatory Visit: Payer: Self-pay

## 2019-03-07 ENCOUNTER — Ambulatory Visit (INDEPENDENT_AMBULATORY_CARE_PROVIDER_SITE_OTHER): Payer: BLUE CROSS/BLUE SHIELD

## 2019-03-07 ENCOUNTER — Ambulatory Visit: Payer: BLUE CROSS/BLUE SHIELD | Admitting: Orthopedic Surgery

## 2019-03-07 VITALS — BP 149/95 | HR 96 | Temp 98.2°F | Ht 62.0 in | Wt 175.0 lb

## 2019-03-07 DIAGNOSIS — M79645 Pain in left finger(s): Secondary | ICD-10-CM

## 2019-03-07 DIAGNOSIS — G8929 Other chronic pain: Secondary | ICD-10-CM

## 2019-03-07 NOTE — Progress Notes (Signed)
NEW PROBLEM OFFICE VISIT  Chief Complaint  Patient presents with  . Hand Pain    Left thumb     63 year old female presents for evaluation of painful left thumb.  She says she has trouble opening things significant pain she cannot crochet is interfering with her life she says.  She says she has arthritis in her hands.  Location left thumb base of the joint of the [place] area quality dull throbbing severity moderate duration several years timing constant exacerbated by activity as listed above.   Review of Systems  Musculoskeletal: Positive for joint pain.  Neurological: Negative for tingling and weakness.     Past Medical History:  Diagnosis Date  . Arthritis   . 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 Asthma   . Chronic back pain   . Chronic constipation   . Depression   . Heart murmur    in childhood  . History of bronchitis   . History of urinary tract infection   . Hypertension   . Imbalance   . Meniere disease   . Neuropathic pain   . Parathyroid adenoma   . Poor fine motor skills    secondary to CVA per right side   . Pre-diabetes   . PTSD (post-traumatic stress disorder)   . Restless legs syndrome 2007 approx  . Seasonal allergies   . Shortness of breath dyspnea    coldness   . Stroke ([place])   . Vertigo     Past Surgical History:  Procedure Laterality Date  . BACK SURGERY    . CESAREAN SECTION     x 2  . PARATHYROIDECTOMY N/A 06/27/2016   Procedure: MINIMALLY INVASIVE PARATHYROIDECTOMY;  Surgeon: Greer Pickerel, MD;  Location: [facility];  Service: General;  Laterality: N/A;  . SHOULDER ARTHROSCOPY W/ ROTATOR CUFF REPAIR     to right shoulder  . SPINE SURGERY  12/2010   ruptd L1 L2 , Dr Carloyn Manner  . TONSILLECTOMY    . TOTAL KNEE ARTHROPLASTY Right 08/12/2014   Procedure: RIGHT TOTAL KNEE ARTHROPLASTY;  Surgeon: Carole Civil, MD;  Location: [facility];  Service: Orthopedics;  Laterality: Right;  . TUBAL LIGATION      Family History  Problem Relation Age of Onset   . Dementia Paternal Grandmother    Social History   Tobacco Use  . Smoking status: Former Smoker    Packs/day: 2.00    Years: 6.00    Pack years: 12.00    Types: Cigarettes    Last attempt to quit: 10/09/1982    Years since quitting: 36.4  . Smokeless tobacco: Never Used  . Tobacco comment: quit in 1984  Substance Use Topics  . Alcohol use: Yes    Comment: occasionally  . Drug use: No    Allergies  Allergen Reactions  . Other Nausea And Vomiting    Peppers  . Pneumococcal Vaccines     Had rash, given both flu and PNA at same time   . Statins   . Influenza Vaccines Rash    Current Meds  Medication Sig  . Albuterol Sulfate (PROAIR RESPICLICK) 161 (90 Base) MCG/ACT AEPB Inhale 2 puffs into the lungs every 4 (four) hours as needed. For shortness of breath.  . Blood Glucose Monitoring Suppl (BLOOD GLUCOSE SYSTEM PAK) KIT Please dispense based on patient and insurance preference. Use as directed to monitor FSBS 1x daily. Dx: E11.9.  . dimenhyDRINATE (DRAMAMINE) 50 MG tablet Take 50 mg by mouth every 8 (eight) hours  as needed.  . empagliflozin (JARDIANCE) 10 MG TABS tablet Take 10 mg by mouth daily.  Marland Kitchen escitalopram (LEXAPRO) 20 MG tablet Take 1 tablet (20 mg total) by mouth daily.  . fenofibrate (TRICOR) 145 MG tablet Take 1 tablet (145 mg total) by mouth daily.  . fluticasone (FLONASE) 50 MCG/ACT nasal spray Place into both nostrils daily.  Marland Kitchen gabapentin (NEURONTIN) 300 MG capsule Take 1 capsule (300 mg total) by mouth 2 (two) times daily.  Elmore Guise Devices MISC Please dispense based on patient and insurance preference. Use as directed to monitor FSBS 1x daily. Dx: E11.9.  . Lancets MISC Please dispense based on patient and insurance preference. Use as directed to monitor FSBS 1x daily. Dx: E11.9.  . lisinopril (ZESTRIL) 10 MG tablet Take 1 tablet (10 mg total) by mouth daily.  . montelukast (SINGULAIR) 10 MG tablet Take 1 tablet (10 mg total) by mouth every evening.  . naproxen  (NAPROSYN) 500 MG tablet TAKE 1 TABLET BY MOUTH TWICE DAILY WITH A MEAL  . ondansetron (ZOFRAN) 4 MG tablet Take 1 tablet (4 mg total) by mouth every 6 (six) hours.  . polyethylene glycol (MIRALAX / GLYCOLAX) packet Take 17 g by mouth daily. (Patient taking differently: Take 17 g by mouth every other day. )  . pramipexole (MIRAPEX) 0.25 MG tablet TAKE 1 TABLET BY MOUTH ONCE DAILY WITH BREAKFAST    BP (!) 149/95   Pulse 96   Temp 98.2 F (36.8 C)   Ht '5\' 2"'  (1.575 m)   Wt 175 lb (79.4 kg)   BMI 32.01 kg/m   Physical Exam Vitals signs and nursing note reviewed.  Constitutional:      Appearance: Normal appearance.  Neurological:     Mental Status: She is alert and oriented to person, place, and time.  Psychiatric:        Mood and Affect: Mood normal.     Ortho Exam  Left thumb tenderness over the [facility] joint with painful range of motion weak painful pinch no instability of skin is intact pulses are good sensation is normal epitrochlear lymph nodes are negative  MEDICAL DECISION SECTION  Xrays were done at [facility]  My independent reading of xrays:  Osteoarthritis [facility] joint  Encounter Diagnosis  Name Primary?  . Chronic pain of left thumb Yes    PLAN: (Rx., injectx, surgery, frx, mri/ct) Splint Injection Patient educated about natural history of disease Rest  Follow-up 6 to 8 weeks  Procedures today  Procedure note injection left thumb  Injection left CMC joint Medication Depo-Medrol 40 mg and lidocaine 1% 2 cc The patient gave verbal consent Timeout confirmed the site of injection left CMC joint  Alcohol and ethyl chloride was used to repair the skin and then a 25-gauge needle was used to inject the [facility] joint of the left thumb  There were no complications a sterile Band-Aid was applied  No orders of the defined types were placed in this encounter.   Arther Abbott, MD  03/07/2019 11:40 AM

## 2019-03-07 NOTE — Patient Instructions (Signed)
Splint 6 weeks continuous except bathing and sleeping then as needed   Ok to apply topical creams for arthritis:  These are the muscle and arthrits creams I recommend:  PLEASE READ THE PACKAGE INSTRUCTIONS BEFORE USING   Ben Gay arthritis cream  Icy hot vanishing gel  Aspercreme odor free  Myoflex Oderless pain reliever  Capzasin  Sportscreme  Max freeze  You have received an injection of steroids into the joint. 15% of patients will have increased pain within the 24 hours postinjection.   This is transient and will go away.   We recommend that you use ice packs on the injection site for 20 minutes every 2 hours and extra strength Tylenol 2 tablets every 8 as needed until the pain resolves.  If you continue to have pain after taking the Tylenol and using the ice please call the office for further instructions.

## 2019-04-10 ENCOUNTER — Other Ambulatory Visit: Payer: Self-pay | Admitting: Family Medicine

## 2019-05-06 ENCOUNTER — Other Ambulatory Visit: Payer: Self-pay | Admitting: Family Medicine

## 2019-06-02 ENCOUNTER — Encounter: Payer: Self-pay | Admitting: Family Medicine

## 2019-06-02 ENCOUNTER — Ambulatory Visit (INDEPENDENT_AMBULATORY_CARE_PROVIDER_SITE_OTHER): Payer: BC Managed Care – PPO | Admitting: Family Medicine

## 2019-06-02 VITALS — BP 138/88 | HR 96 | Temp 98.4°F | Resp 14 | Ht 62.0 in | Wt 177.0 lb

## 2019-06-02 DIAGNOSIS — E119 Type 2 diabetes mellitus without complications: Secondary | ICD-10-CM | POA: Diagnosis not present

## 2019-06-02 DIAGNOSIS — E6609 Other obesity due to excess calories: Secondary | ICD-10-CM

## 2019-06-02 DIAGNOSIS — I10 Essential (primary) hypertension: Secondary | ICD-10-CM

## 2019-06-02 DIAGNOSIS — Z6832 Body mass index (BMI) 32.0-32.9, adult: Secondary | ICD-10-CM

## 2019-06-02 DIAGNOSIS — E782 Mixed hyperlipidemia: Secondary | ICD-10-CM

## 2019-06-02 DIAGNOSIS — L600 Ingrowing nail: Secondary | ICD-10-CM

## 2019-06-02 DIAGNOSIS — E559 Vitamin D deficiency, unspecified: Secondary | ICD-10-CM

## 2019-06-02 MED ORDER — CEPHALEXIN 500 MG PO CAPS: 500.0000 mg | ORAL_CAPSULE | Freq: Two times a day (BID) | ORAL | 0 refills | Status: DC

## 2019-06-02 NOTE — Assessment & Plan Note (Addendum)
On jardiance, recheck A1c

## 2019-06-02 NOTE — Assessment & Plan Note (Signed)
Continue tricor, statin intolerance

## 2019-06-02 NOTE — Progress Notes (Signed)
   Subjective:    Patient ID: Teresa Soto, female    DOB: 01-21-1956, 63 y.o.   MRN: [id number]  Patient presents for Toe Infection   Pt here wit infection to left great toe , noticed thickened nail, she tried to cut and file down the skin, then had yellow discharge and redness over the weekend, no fever,minimal tenderness  has not used topical meds   Note  last visit in July 2019 for chronic medical problems  DM-  Last A1C 6.7% in Oct  2019,TAKING JARDIANCE 10MG  ONCE A DAY   HTN- taking BP meds as prescribed , lisinoril  Hyperlipdemia on tricor   She has been followed by orthopedics due to stress fracture in left fooT, HAS history of vitamin D def  History of stroke with memory loss on aricept   Review Of Systems:  GEN- denies fatigue, fever, weight loss,weakness, recent illness HEENT- denies eye drainage, change in vision, nasal discharge, CVS- denies chest pain, palpitations RESP- denies SOB, cough, wheeze ABD- denies N/V, change in stools, abd pain GU- denies dysuria, hematuria, dribbling, incontinence MSK- denies joint pain, muscle aches, injury Neuro- denies headache, dizziness, syncope, seizure activity       Objective:    BP 138/88   Pulse 96   Temp 98.4 F (36.9 C) (Oral)   Resp 14   Ht 5\' 2"  (1.575 m)   Wt 177 lb (80.3 kg)   SpO2 98%   BMI 32.37 kg/m  GEN- NAD, alert and oriented x3 HEENT- PERRL, EOMI, non injected sclera, pink conjunctiva, MMM, oropharynx clear Neck- Supple, no thyromegaly CVS- RRR, no murmur RESP-CTAB ABD-NABS,soft,NT,ND EXT- No edema Skin- Left great toenail- thickened, yellow crusting at lateral edge of nail with erythema  Pulses- Radial, DP- 2+        Assessment & Plan:      Problem List Items Addressed This Visit      Unprioritized   Diabetes mellitus, type II ([place])    On jardiance, recheck A1c      Relevant Orders   Hemoglobin A1c   HM DIABETES FOOT EXAM (Completed)   Essential hypertension - Primary   Controlled no changes       Relevant Orders   CBC with Differential/Platelet   Comprehensive metabolic panel   Hyperlipidemia    Continue tricor, statin intolerance      Relevant Orders   Lipid panel   Obesity   Vitamin D deficiency   Relevant Orders   Vitamin D, 25-hydroxy    Other Visit Diagnoses    Ingrown toenail of left foot with infection       Relevant Medications   cephALEXin (KEFLEX) 500 MG capsule      Note: This dictation was prepared with Dragon dictation along with smaller phrase technology. Any transcriptional errors that result from this process are unintentional.

## 2019-06-02 NOTE — Patient Instructions (Signed)
F/U 6 months for Physical  

## 2019-06-02 NOTE — Assessment & Plan Note (Signed)
Controlled no changes 

## 2019-06-03 LAB — CBC WITH DIFFERENTIAL/PLATELET
Absolute Monocytes: 409 cells/uL (ref 200–950)
Basophils Absolute: 27 cells/uL (ref 0–200)
Basophils Relative: 0.4 %
Eosinophils Absolute: 127 cells/uL (ref 15–500)
Eosinophils Relative: 1.9 %
HCT: 41.2 % (ref 35.0–45.0)
Hemoglobin: 13.4 g/dL (ref 11.7–15.5)
Lymphs Abs: 1816 cells/uL (ref 850–3900)
MCH: 27.7 pg (ref 27.0–33.0)
MCHC: 32.5 g/dL (ref 32.0–36.0)
MCV: 85.3 fL (ref 80.0–100.0)
MPV: 9.9 fL (ref 7.5–12.5)
Monocytes Relative: 6.1 %
Neutro Abs: 4322 cells/uL (ref 1500–7800)
Neutrophils Relative %: 64.5 %
Platelets: 391 10*3/uL (ref 140–400)
RBC: 4.83 10*6/uL (ref 3.80–5.10)
RDW: 13.9 % (ref 11.0–15.0)
Total Lymphocyte: 27.1 %
WBC: 6.7 10*3/uL (ref 3.8–10.8)

## 2019-06-03 LAB — COMPREHENSIVE METABOLIC PANEL
AG Ratio: 1.6 (calc) (ref 1.0–2.5)
ALT: 12 U/L (ref 6–29)
AST: 13 U/L (ref 10–35)
Albumin: 4.3 g/dL (ref 3.6–5.1)
Alkaline phosphatase (APISO): 32 U/L — ABNORMAL LOW (ref 37–153)
BUN/Creatinine Ratio: 16 (calc) (ref 6–22)
BUN: 21 mg/dL (ref 7–25)
CO2: 27 mmol/L (ref 20–32)
Calcium: 11.4 mg/dL — ABNORMAL HIGH (ref 8.6–10.4)
Chloride: 104 mmol/L (ref 98–110)
Creat: 1.33 mg/dL — ABNORMAL HIGH (ref 0.50–0.99)
Globulin: 2.7 g/dL (calc) (ref 1.9–3.7)
Glucose, Bld: 136 mg/dL — ABNORMAL HIGH (ref 65–99)
Potassium: 4.3 mmol/L (ref 3.5–5.3)
Sodium: 139 mmol/L (ref 135–146)
Total Bilirubin: 0.4 mg/dL (ref 0.2–1.2)
Total Protein: 7 g/dL (ref 6.1–8.1)

## 2019-06-03 LAB — VITAMIN D 25 HYDROXY (VIT D DEFICIENCY, FRACTURES): Vit D, 25-Hydroxy: 27 ng/mL — ABNORMAL LOW (ref 30–100)

## 2019-06-03 LAB — HEMOGLOBIN A1C
Hgb A1c MFr Bld: 6.7 % of total Hgb — ABNORMAL HIGH (ref <5.7)
Mean Plasma Glucose: 146 (calc)
eAG (mmol/L): 8.1 (calc)

## 2019-06-03 LAB — LIPID PANEL
Cholesterol: 191 mg/dL (ref <200)
HDL: 45 mg/dL — ABNORMAL LOW (ref >=50)
LDL Cholesterol (Calc): 118 mg/dL (calc) — ABNORMAL HIGH
Non-HDL Cholesterol (Calc): 146 mg/dL (calc) — ABNORMAL HIGH (ref <130)
Total CHOL/HDL Ratio: 4.2 (calc) (ref <5.0)
Triglycerides: 160 mg/dL — ABNORMAL HIGH (ref <150)

## 2019-06-04 ENCOUNTER — Ambulatory Visit: Payer: BC Managed Care – PPO | Admitting: Orthopedic Surgery

## 2019-06-09 ENCOUNTER — Ambulatory Visit: Payer: BC Managed Care – PPO | Admitting: Orthopedic Surgery

## 2019-06-09 ENCOUNTER — Other Ambulatory Visit: Payer: Self-pay

## 2019-06-09 VITALS — BP 139/81 | HR 88 | Temp 98.1°F | Ht 61.0 in | Wt 174.0 lb

## 2019-06-09 DIAGNOSIS — G8929 Other chronic pain: Secondary | ICD-10-CM | POA: Diagnosis not present

## 2019-06-09 DIAGNOSIS — M79645 Pain in left finger(s): Secondary | ICD-10-CM | POA: Diagnosis not present

## 2019-06-09 DIAGNOSIS — M1812 Unilateral primary osteoarthritis of first carpometacarpal joint, left hand: Secondary | ICD-10-CM

## 2019-06-09 NOTE — Progress Notes (Addendum)
Chief Complaint  Patient presents with  . Follow-up    Recheck left thumb.      62 years old being treated for OA left thumb tried to wear brace brace was not tolerable for her it made her unable to grip anything so she had to take it off comes in with persistent pain at the [facility] joint left thumb   Review of Systems  Neurological: Negative for tingling and sensory change.    Past Medical History:  Diagnosis Date  . Arthritis   . 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 Asthma   . Chronic back pain   . Chronic constipation   . Depression   . Heart murmur    in childhood  . History of bronchitis   . History of urinary tract infection   . Hypertension   . Imbalance   . Meniere disease   . Neuropathic pain   . Parathyroid adenoma   . Poor fine motor skills    secondary to CVA per right side   . Pre-diabetes   . PTSD (post-traumatic stress disorder)   . Restless legs syndrome 2007 approx  . Seasonal allergies   . Shortness of breath dyspnea    coldness   . Stroke ([place])   . Vertigo     Current Outpatient Medications:  .  Albuterol Sulfate (PROAIR RESPICLICK) 643 (90 Base) MCG/ACT AEPB, Inhale 2 puffs into the lungs every 4 (four) hours as needed. For shortness of breath., Disp: 3 each, Rfl: 3 .  BIOTIN PO, Take 1 tablet by mouth every evening. , Disp: , Rfl:  .  Blood Glucose Monitoring Suppl (BLOOD GLUCOSE SYSTEM PAK) KIT, Please dispense based on patient and insurance preference. Use as directed to monitor FSBS 1x daily. Dx: E11.9., Disp: 1 each, Rfl: 1 .  cephALEXin (KEFLEX) 500 MG capsule, Take 1 capsule (500 mg total) by mouth 2 (two) times daily., Disp: 14 capsule, Rfl: 0 .  dimenhyDRINATE (DRAMAMINE) 50 MG tablet, Take 50 mg by mouth every 8 (eight) hours as needed., Disp: , Rfl:  .  donepezil (ARICEPT) 5 MG tablet, , Disp: , Rfl:  .  escitalopram (LEXAPRO) 20 MG tablet, Take 1 tablet (20 mg total) by mouth daily., Disp: 90 tablet, Rfl: 0 .  fenofibrate (TRICOR) 145  MG tablet, Take 1 tablet (145 mg total) by mouth daily., Disp: 90 tablet, Rfl: 0 .  fluticasone (FLONASE) 50 MCG/ACT nasal spray, Place into both nostrils daily., Disp: , Rfl:  .  gabapentin (NEURONTIN) 300 MG capsule, Take 1 capsule by mouth twice daily., Disp: 180 capsule, Rfl: 0 .  JARDIANCE 10 MG TABS tablet, Take 10 mg by mouth daily., Disp: 90 tablet, Rfl: 0 .  Lancet Devices MISC, Please dispense based on patient and insurance preference. Use as directed to monitor FSBS 1x daily. Dx: E11.9., Disp: 1 each, Rfl: 1 .  Lancets MISC, Please dispense based on patient and insurance preference. Use as directed to monitor FSBS 1x daily. Dx: E11.9., Disp: 100 each, Rfl: 1 .  lisinopril (ZESTRIL) 10 MG tablet, Take 1 tablet (10 mg total) by mouth daily., Disp: 90 tablet, Rfl: 2 .  montelukast (SINGULAIR) 10 MG tablet, Take 1 tablet (10 mg total) by mouth every evening., Disp: 90 tablet, Rfl: 0 .  naproxen (NAPROSYN) 500 MG tablet, TAKE 1 TABLET BY MOUTH TWICE DAILY WITH A MEAL, Disp: 180 tablet, Rfl: 0 .  ondansetron (ZOFRAN) 4 MG tablet, Take 1 tablet (4 mg total) by mouth  every 6 (six) hours., Disp: 90 tablet, Rfl: 0 .  polyethylene glycol (MIRALAX / GLYCOLAX) packet, Take 17 g by mouth daily. (Patient taking differently: Take 17 g by mouth every other day. ), Disp: 14 each, Rfl: 0 .  pramipexole (MIRAPEX) 0.25 MG tablet, TAKE 1 TABLET BY MOUTH ONCE DAILY WITH BREAKFAST, Disp: 90 tablet, Rfl: 0 .  ketoconazole (NIZORAL) 2 % cream, Apply 1 application topically daily. (Patient not taking: Reported on 06/09/2019), Disp: 15 g, Rfl: 0   BP 139/81   Pulse 88   Temp 98.1 F (36.7 C)   Ht _0  (1.549 m)   Wt 174 lb (78.9 kg)   BMI 32.88 kg/m   Ortho Exam  Alert and oriented x3  Mood affect normal  Left thumb tenderness at [facility] joint significant crepitance no contracture joint is stable flexor tendon normal strength neurovascular exam intact     Encounter Diagnoses  Name Primary?  . Chronic  pain of left thumb Yes  . Arthritis of carpometacarpal (CMC) joint of left thumb       Plan   Inject left thumb follow-up 6 weeks  Procedure note injection left thumb  Injection left CMC joint Medication Depo-Medrol 40 mg and lidocaine 1% 2 cc The patient gave verbal consent Timeout confirmed the site of injection left CMC joint  Alcohol and ethyl chloride was used to repair the skin and then a 25-gauge needle was used to inject the [facility] joint of the left thumb  There were no complications a sterile Band-Aid was applied

## 2019-06-09 NOTE — Patient Instructions (Signed)

## 2019-07-05 ENCOUNTER — Other Ambulatory Visit: Payer: Self-pay | Admitting: Family Medicine

## 2019-07-21 ENCOUNTER — Ambulatory Visit: Payer: BC Managed Care – PPO | Admitting: Orthopedic Surgery

## 2019-08-06 ENCOUNTER — Other Ambulatory Visit: Payer: Self-pay | Admitting: Family Medicine

## 2019-09-01 ENCOUNTER — Other Ambulatory Visit: Payer: Self-pay

## 2019-09-01 MED ORDER — PROAIR RESPICLICK 108 (90 BASE) MCG/ACT IN AEPB: 2.0000 | INHALATION_SPRAY | RESPIRATORY_TRACT | 1 refills | Status: DC | PRN

## 2019-10-02 ENCOUNTER — Other Ambulatory Visit: Payer: Self-pay | Admitting: Family Medicine

## 2019-11-01 ENCOUNTER — Other Ambulatory Visit: Payer: Self-pay | Admitting: Family Medicine

## 2019-11-03 NOTE — Telephone Encounter (Signed)
Last office visit: 06/02/2019 Last refilled: 08/06/2019

## 2019-12-31 ENCOUNTER — Other Ambulatory Visit: Payer: Self-pay | Admitting: Family Medicine

## 2020-01-30 ENCOUNTER — Other Ambulatory Visit: Payer: Self-pay | Admitting: Family Medicine

## 2020-03-30 ENCOUNTER — Other Ambulatory Visit: Payer: Self-pay | Admitting: Family Medicine

## 2020-04-29 ENCOUNTER — Other Ambulatory Visit: Payer: Self-pay | Admitting: Family Medicine

## 2020-05-29 ENCOUNTER — Other Ambulatory Visit: Payer: Self-pay | Admitting: Family Medicine

## 2020-06-17 ENCOUNTER — Encounter: Payer: Self-pay | Admitting: Nurse Practitioner

## 2020-06-17 ENCOUNTER — Other Ambulatory Visit: Payer: Self-pay

## 2020-06-17 ENCOUNTER — Ambulatory Visit (INDEPENDENT_AMBULATORY_CARE_PROVIDER_SITE_OTHER): Payer: BC Managed Care – PPO | Admitting: Nurse Practitioner

## 2020-06-17 VITALS — BP 140/84 | HR 96 | Temp 97.7°F | Resp 18 | Wt 177.8 lb

## 2020-06-17 DIAGNOSIS — Z0001 Encounter for general adult medical examination with abnormal findings: Secondary | ICD-10-CM

## 2020-06-17 DIAGNOSIS — I10 Essential (primary) hypertension: Secondary | ICD-10-CM | POA: Diagnosis not present

## 2020-06-17 DIAGNOSIS — E210 Primary hyperparathyroidism: Secondary | ICD-10-CM

## 2020-06-17 DIAGNOSIS — E669 Obesity, unspecified: Secondary | ICD-10-CM

## 2020-06-17 DIAGNOSIS — E119 Type 2 diabetes mellitus without complications: Secondary | ICD-10-CM | POA: Diagnosis not present

## 2020-06-17 DIAGNOSIS — J45909 Unspecified asthma, uncomplicated: Secondary | ICD-10-CM

## 2020-06-17 DIAGNOSIS — Z0000 Encounter for general adult medical examination without abnormal findings: Secondary | ICD-10-CM

## 2020-06-17 DIAGNOSIS — E782 Mixed hyperlipidemia: Secondary | ICD-10-CM | POA: Diagnosis not present

## 2020-06-17 DIAGNOSIS — Z1212 Encounter for screening for malignant neoplasm of rectum: Secondary | ICD-10-CM

## 2020-06-17 DIAGNOSIS — Z5320 Procedure and treatment not carried out because of patient's decision for unspecified reasons: Secondary | ICD-10-CM

## 2020-06-17 DIAGNOSIS — Z1231 Encounter for screening mammogram for malignant neoplasm of breast: Secondary | ICD-10-CM

## 2020-06-17 DIAGNOSIS — Z1211 Encounter for screening for malignant neoplasm of colon: Secondary | ICD-10-CM

## 2020-06-17 DIAGNOSIS — G4700 Insomnia, unspecified: Secondary | ICD-10-CM

## 2020-06-17 DIAGNOSIS — E559 Vitamin D deficiency, unspecified: Secondary | ICD-10-CM | POA: Diagnosis not present

## 2020-06-17 DIAGNOSIS — Z23 Encounter for immunization: Secondary | ICD-10-CM

## 2020-06-17 NOTE — Progress Notes (Signed)
Established Patient Office Visit  Subjective:  Patient ID: Teresa Soto, female    DOB: 02/16/1956  Age: 64 y.o. MRN: [id number]  CC:  Chief Complaint  Patient presents with  . Annual Exam    HPI Teresa Soto is a 64 year old female presenting for her annual adult CPE examination. See treatment plan for care gap/screenings utd. She will complete her labs today. She feels she is trying to follow a diabetic diet, trying to get exercise but limited by her chronic back pain.   Time spent discussing HTN management at home with bp checks 3 hours after taking medication, diet treatment plan with exercise, screening per age, vaccinations, DM management.   No cp, ct, gu/gi sxs, pain, sob, edema, or falls/injury.  Past Medical History:  Diagnosis Date  . Arthritis   . 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 Asthma   . Asthma    Phreesia 06/16/2020  . Chronic back pain   . Chronic constipation   . Depression   . Diabetes mellitus without complication ([place])    Phreesia 06/16/2020  . Heart murmur    in childhood  . History of bronchitis   . History of urinary tract infection   . Hyperlipidemia    Phreesia 06/16/2020  . Hypertension   . Imbalance   . Meniere disease   . Neuropathic pain   . Osteoporosis    Phreesia 06/16/2020  . Parathyroid adenoma   . Poor fine motor skills    secondary to CVA per right side   . Pre-diabetes   . PTSD (post-traumatic stress disorder)   . Restless legs syndrome 2007 approx  . Seasonal allergies   . Shortness of breath dyspnea    coldness   . Stroke ([place])   . Vertigo     Past Surgical History:  Procedure Laterality Date  . BACK SURGERY    . CESAREAN SECTION     x 2  . CESAREAN SECTION N/A    Phreesia 06/16/2020  . JOINT REPLACEMENT N/A    Phreesia 06/16/2020  . PARATHYROIDECTOMY N/A 06/27/2016   Procedure: MINIMALLY INVASIVE PARATHYROIDECTOMY;  Surgeon: Greer Pickerel, MD;  Location: [facility];  Service: General;  Laterality: N/A;  .  SHOULDER ARTHROSCOPY W/ ROTATOR CUFF REPAIR     to right shoulder  . SPINE SURGERY  12/2010   ruptd L1 L2 , Dr Carloyn Manner  . TONSILLECTOMY    . TOTAL KNEE ARTHROPLASTY Right 08/12/2014   Procedure: RIGHT TOTAL KNEE ARTHROPLASTY;  Surgeon: Carole Civil, MD;  Location: [facility];  Service: Orthopedics;  Laterality: Right;  . TUBAL LIGATION      Family History  Problem Relation Age of Onset  . Dementia Paternal Grandmother     Social History   Socioeconomic History  . Marital status: Married    Spouse name: Not on file  . Number of children: Not on file  . Years of education: Not on file  . Highest education level: Not on file  Occupational History  . Not on file  Tobacco Use  . Smoking status: Former Smoker    Packs/day: 2.00    Years: 6.00    Pack years: 12.00    Types: Cigarettes    Quit date: 10/09/1982    Years since quitting: 37.7  . Smokeless tobacco: Never Used  . Tobacco comment: quit in 1984  Substance and Sexual Activity  . Alcohol use: Yes    Comment: occasionally  . Drug use: No  .  Sexual activity: Yes    Birth control/protection: Surgical  Other Topics Concern  . Not on file  Social History Narrative  . Not on file   Social Determinants of Health   Financial Resource Strain:   . Difficulty of Paying Living Expenses: Not on file  Food Insecurity:   . Worried About Charity fundraiser in the Last Year: Not on file  . Ran Out of Food in the Last Year: Not on file  Transportation Needs:   . Lack of Transportation (Medical): Not on file  . Lack of Transportation (Non-Medical): Not on file  Physical Activity:   . Days of Exercise per Week: Not on file  . Minutes of Exercise per Session: Not on file  Stress:   . Feeling of Stress : Not on file  Social Connections:   . Frequency of Communication with Friends and Family: Not on file  . Frequency of Social Gatherings with Friends and Family: Not on file  . Attends Religious Services: Not on file  . Active  Member of Clubs or Organizations: Not on file  . Attends Archivist Meetings: Not on file  . Marital Status: Not on file  Intimate Partner Violence:   . Fear of Current or Ex-Partner: Not on file  . Emotionally Abused: Not on file  . Physically Abused: Not on file  . Sexually Abused: Not on file    Outpatient Medications Prior to Visit  Medication Sig Dispense Refill  . Albuterol Sulfate (PROAIR RESPICLICK) 937 (90 Base) MCG/ACT AEPB Inhale 2 puffs into the lungs every 4 (four) hours as needed. For shortness of breath. 3 each 1  . aspirin EC 81 MG tablet Take 81 mg by mouth daily. Swallow whole.    Marland Kitchen BIOTIN PO Take 1 tablet by mouth every evening.     . Blood Glucose Monitoring Suppl (BLOOD GLUCOSE SYSTEM PAK) KIT Please dispense based on patient and insurance preference. Use as directed to monitor FSBS 1x daily. Dx: E11.9. 1 each 1  . escitalopram (LEXAPRO) 20 MG tablet Take 1 tablet by mouth daily. 90 tablet 0  . fenofibrate (TRICOR) 145 MG tablet Take 1 tablet by mouth daily. 90 tablet 0  . fluticasone (FLONASE) 50 MCG/ACT nasal spray Place into both nostrils daily.    Marland Kitchen gabapentin (NEURONTIN) 300 MG capsule Take 1 capsule by mouth twice daily. 180 capsule 0  . JARDIANCE 10 MG TABS tablet Take 1 tablet by mouth daily. 90 tablet 0  . Lancet Devices MISC Please dispense based on patient and insurance preference. Use as directed to monitor FSBS 1x daily. Dx: E11.9. 1 each 1  . Lancets MISC Please dispense based on patient and insurance preference. Use as directed to monitor FSBS 1x daily. Dx: E11.9. 100 each 1  . lisinopril (ZESTRIL) 10 MG tablet Take 1 tablet by mouth daily. 90 tablet 2  . montelukast (SINGULAIR) 10 MG tablet Take 1 tablet by mouth every evening. 90 tablet 0  . naproxen (NAPROSYN) 500 MG tablet Take 1 tablet by mouth twice daily with meals. 180 tablet 0  . polyethylene glycol (MIRALAX / GLYCOLAX) packet Take 17 g by mouth daily. (Patient taking differently: Take  17 g by mouth every other day. ) 14 each 0  . pramipexole (MIRAPEX) 0.25 MG tablet Take 1 tablet by mouth once daily with breakfast. 90 tablet 0  . cephALEXin (KEFLEX) 500 MG capsule Take 1 capsule (500 mg total) by mouth 2 (two) times daily. 14 capsule  0  . dimenhyDRINATE (DRAMAMINE) 50 MG tablet Take 50 mg by mouth every 8 (eight) hours as needed.    . donepezil (ARICEPT) 5 MG tablet     . ketoconazole (NIZORAL) 2 % cream Apply 1 application topically daily. (Patient not taking: Reported on 06/09/2019) 15 g 0  . ondansetron (ZOFRAN) 4 MG tablet Take 1 tablet (4 mg total) by mouth every 6 (six) hours. 90 tablet 0   No facility-administered medications prior to visit.    Allergies  Allergen Reactions  . Other Nausea And Vomiting    Peppers  . Pneumococcal Vaccines     Had rash, given both flu and PNA at same time   . Statins   . Influenza Vaccines Rash    ROS Review of Systems  All other systems reviewed and are negative.     Objective:    Physical Exam Constitutional:      General: She is not in acute distress.    Appearance: Normal appearance. She is well-developed and well-groomed. She is not ill-appearing, toxic-appearing or diaphoretic.  HENT:     Head: Normocephalic and atraumatic.     Right Ear: Hearing, tympanic membrane, ear canal and external ear normal.     Left Ear: Hearing, tympanic membrane, ear canal and external ear normal.     Nose: Nose normal.     Mouth/Throat:     Lips: Pink.     Mouth: Mucous membranes are dry.     Dentition: Normal dentition. No dental tenderness.     Tongue: No lesions. Tongue does not deviate from midline.     Palate: No mass and lesions.     Pharynx: Oropharynx is clear. Uvula midline.  Eyes:     General: Lids are normal. Lids are everted, no foreign bodies appreciated.     Extraocular Movements: Extraocular movements intact.     Conjunctiva/sclera: Conjunctivae normal.     Pupils: Pupils are equal, round, and reactive to  light.  Neck:     Thyroid: No thyromegaly or thyroid tenderness.     Vascular: No carotid bruit or JVD.  Cardiovascular:     Rate and Rhythm: Normal rate and regular rhythm.     Pulses: Normal pulses.     Heart sounds: Normal heart sounds, S1 normal and S2 normal.  Pulmonary:     Effort: Pulmonary effort is normal.     Breath sounds: Normal breath sounds.  Abdominal:     General: Abdomen is flat. Bowel sounds are normal. There is no abdominal bruit.     Palpations: Abdomen is soft. There is no hepatomegaly or splenomegaly.     Tenderness: There is no abdominal tenderness. There is no right CVA tenderness, left CVA tenderness, guarding or rebound.  Musculoskeletal:        General: Normal range of motion.     Right shoulder: Normal.     Left shoulder: Normal.     Cervical back: Normal, full passive range of motion without pain, normal range of motion and neck supple.     Thoracic back: Normal.     Lumbar back: Normal.     Right lower leg: Normal. No edema.     Left lower leg: Normal. No edema.     Right ankle: Normal.     Left ankle: Normal.       Legs:     Comments: Scar from knee surgery  Lymphadenopathy:     Head:     Right side of head: No  submental, submandibular or tonsillar adenopathy.     Left side of head: No submental, submandibular or tonsillar adenopathy.     Cervical: No cervical adenopathy.  Skin:    General: Skin is warm and dry.     Capillary Refill: Capillary refill takes less than 2 seconds.     Coloration: Skin is not cyanotic, jaundiced or pale.  Neurological:     General: No focal deficit present.     Mental Status: She is alert and oriented to person, place, and time.     GCS: GCS eye subscore is 4. GCS verbal subscore is 5. GCS motor subscore is 6.     Cranial Nerves: Cranial nerves are intact.     Gait: Gait normal.  Psychiatric:        Attention and Perception: Attention and perception normal.        Mood and Affect: Mood and affect normal.         Speech: Speech normal.        Behavior: Behavior normal. Behavior is cooperative.        Thought Content: Thought content normal.        Judgment: Judgment normal.     BP 140/84 (BP Location: Left Arm, Patient Position: Sitting, Cuff Size: Normal)   Pulse 96   Temp 97.7 F (36.5 C) (Temporal)   Resp 18   Wt 177 lb 12.8 oz (80.6 kg)   SpO2 96%   BMI 33.60 kg/m  Wt Readings from Last 3 Encounters:  06/17/20 177 lb 12.8 oz (80.6 kg)  06/09/19 174 lb (78.9 kg)  06/02/19 177 lb (80.3 kg)     Health Maintenance Due  Topic Date Due  . COLONOSCOPY  Never done  . HEMOGLOBIN A1C  12/03/2019  . MAMMOGRAM  02/26/2020    There are no preventive care reminders to display for this patient.  Lab Results  Component Value Date   TSH 1.48 11/22/2016   Lab Results  Component Value Date   WBC 6.7 06/02/2019   HGB 13.4 06/02/2019   HCT 41.2 06/02/2019   MCV 85.3 06/02/2019   PLT 391 06/02/2019   Lab Results  Component Value Date   NA 139 06/02/2019   K 4.3 06/02/2019   CO2 27 06/02/2019   GLUCOSE 136 (H) 06/02/2019   BUN 21 06/02/2019   CREATININE 1.33 (H) 06/02/2019   BILITOT 0.4 06/02/2019   ALKPHOS 35 (L) 01/02/2018   AST 13 06/02/2019   ALT 12 06/02/2019   PROT 7.0 06/02/2019   ALBUMIN 4.2 01/02/2018   CALCIUM 11.4 (H) 06/02/2019   ANIONGAP 11 01/02/2018   Lab Results  Component Value Date   CHOL 191 06/02/2019   Lab Results  Component Value Date   HDL 45 (L) 06/02/2019   Lab Results  Component Value Date   LDLCALC 118 (H) 06/02/2019   Lab Results  Component Value Date   TRIG 160 (H) 06/02/2019   Lab Results  Component Value Date   CHOLHDL 4.2 06/02/2019   Lab Results  Component Value Date   HGBA1C 6.7 (H) 06/02/2019      Assessment & Plan:   Problem List Items Addressed This Visit      Cardiovascular and Mediastinum   Essential hypertension   Relevant Medications   aspirin EC 81 MG tablet   Other Relevant Orders   CBC with  Differential/Platelet   COMPLETE METABOLIC PANEL WITH GFR     Respiratory   Asthma  Endocrine   Hyperparathyroidism, primary ([place])   Relevant Orders   T4, free   TSH   Diabetes mellitus, type II ([place])   Relevant Medications   aspirin EC 81 MG tablet   Other Relevant Orders   Hemoglobin A1c   Microalbumin, urine   CBC with Differential/Platelet   COMPLETE METABOLIC PANEL WITH GFR     Other   Hyperlipidemia   Relevant Medications   aspirin EC 81 MG tablet   Other Relevant Orders   Lipid panel   INSOMNIA   Vitamin D deficiency   Relevant Orders   VITAMIN D 25 Hydroxy (Vit-D Deficiency, Fractures)   Obesity    Other Visit Diagnoses    Annual visit for general adult medical examination without abnormal findings    -  Primary   Relevant Orders   Hemoglobin A1c   T4, free   VITAMIN D 25 Hydroxy (Vit-D Deficiency, Fractures)   Lipid panel   Microalbumin, urine   CBC with Differential/Platelet   COMPLETE METABOLIC PANEL WITH GFR   TSH   Encounter for colorectal cancer screening       Relevant Orders   Ambulatory referral to Gastroenterology   Need for vaccination against Streptococcus pneumoniae using pneumococcal conjugate vaccine 13       Breast cancer screening by mammogram       Relevant Orders   MM Digital Screening   Cervical cancer screening declined        make apt for PAP as it is Due if not completed today.  Your colonoscopy is Due You stated you have made apt for October for your Opthalmology apt. Labs completed today for Chronic condition HTN, Asthma: stable CPE completed today due again in one year With Chronic Back pain try to get alternative exercise such as low lb weight arm lifting while sitting, this will build muscle and burn calorie Eat low fat, low cholesterol, low carb, low sugar diet, 2 gram sodium per day Drink plenty of water  No orders of the defined types were placed in this encounter.   Follow-up: Return in about 1 year (around  06/17/2021) for cpe make apt for PAP.    Annie Main, FNP

## 2020-06-18 LAB — COMPLETE METABOLIC PANEL WITH GFR
AG Ratio: 1.5 (calc) (ref 1.0–2.5)
ALT: 13 U/L (ref 6–29)
AST: 14 U/L (ref 10–35)
Albumin: 4.2 g/dL (ref 3.6–5.1)
Alkaline phosphatase (APISO): 38 U/L (ref 37–153)
BUN/Creatinine Ratio: 24 (calc) — ABNORMAL HIGH (ref 6–22)
BUN: 28 mg/dL — ABNORMAL HIGH (ref 7–25)
CO2: 25 mmol/L (ref 20–32)
Calcium: 11.3 mg/dL — ABNORMAL HIGH (ref 8.6–10.4)
Chloride: 106 mmol/L (ref 98–110)
Creat: 1.18 mg/dL — ABNORMAL HIGH (ref 0.50–0.99)
GFR, Est African American: 57 mL/min/{1.73_m2} — ABNORMAL LOW (ref >=60)
GFR, Est Non African American: 49 mL/min/{1.73_m2} — ABNORMAL LOW (ref >=60)
Globulin: 2.8 g/dL (calc) (ref 1.9–3.7)
Glucose, Bld: 89 mg/dL (ref 65–99)
Potassium: 4.4 mmol/L (ref 3.5–5.3)
Sodium: 139 mmol/L (ref 135–146)
Total Bilirubin: 0.4 mg/dL (ref 0.2–1.2)
Total Protein: 7 g/dL (ref 6.1–8.1)

## 2020-06-18 LAB — HEMOGLOBIN A1C
Hgb A1c MFr Bld: 7.3 % of total Hgb — ABNORMAL HIGH (ref <5.7)
Mean Plasma Glucose: 163 (calc)
eAG (mmol/L): 9 (calc)

## 2020-06-18 LAB — CBC WITH DIFFERENTIAL/PLATELET
Absolute Monocytes: 562 cells/uL (ref 200–950)
Basophils Absolute: 38 cells/uL (ref 0–200)
Basophils Relative: 0.5 %
Eosinophils Absolute: 137 cells/uL (ref 15–500)
Eosinophils Relative: 1.8 %
HCT: 43.6 % (ref 35.0–45.0)
Hemoglobin: 14 g/dL (ref 11.7–15.5)
Lymphs Abs: 2067 cells/uL (ref 850–3900)
MCH: 27.7 pg (ref 27.0–33.0)
MCHC: 32.1 g/dL (ref 32.0–36.0)
MCV: 86.3 fL (ref 80.0–100.0)
MPV: 9.8 fL (ref 7.5–12.5)
Monocytes Relative: 7.4 %
Neutro Abs: 4796 cells/uL (ref 1500–7800)
Neutrophils Relative %: 63.1 %
Platelets: 387 10*3/uL (ref 140–400)
RBC: 5.05 10*6/uL (ref 3.80–5.10)
RDW: 13.7 % (ref 11.0–15.0)
Total Lymphocyte: 27.2 %
WBC: 7.6 10*3/uL (ref 3.8–10.8)

## 2020-06-18 LAB — TSH: TSH: 0.67 mIU/L (ref 0.40–4.50)

## 2020-06-18 LAB — T4, FREE: Free T4: 1.2 ng/dL (ref 0.8–1.8)

## 2020-06-18 LAB — LIPID PANEL
Cholesterol: 180 mg/dL (ref <200)
HDL: 47 mg/dL — ABNORMAL LOW (ref >=50)
LDL Cholesterol (Calc): 108 mg/dL (calc) — ABNORMAL HIGH
Non-HDL Cholesterol (Calc): 133 mg/dL (calc) — ABNORMAL HIGH (ref <130)
Total CHOL/HDL Ratio: 3.8 (calc) (ref <5.0)
Triglycerides: 130 mg/dL (ref <150)

## 2020-06-18 LAB — MICROALBUMIN, URINE: Microalb, Ur: 2 mg/dL

## 2020-06-18 LAB — VITAMIN D 25 HYDROXY (VIT D DEFICIENCY, FRACTURES): Vit D, 25-Hydroxy: 38 ng/mL (ref 30–100)

## 2020-06-21 NOTE — Progress Notes (Signed)
A1C elevated from last year 6.7 to now 7.3. exercise and low carb/sugar diet as discussed at appt.   Other labs stable / no change  Labs f/u in 6 months

## 2020-06-24 ENCOUNTER — Encounter (INDEPENDENT_AMBULATORY_CARE_PROVIDER_SITE_OTHER): Payer: Self-pay | Admitting: *Deleted

## 2020-06-28 ENCOUNTER — Other Ambulatory Visit: Payer: Self-pay | Admitting: Family Medicine

## 2020-07-05 ENCOUNTER — Encounter: Payer: Self-pay | Admitting: Orthopedic Surgery

## 2020-07-05 ENCOUNTER — Other Ambulatory Visit: Payer: Self-pay

## 2020-07-05 ENCOUNTER — Ambulatory Visit: Payer: BC Managed Care – PPO

## 2020-07-05 ENCOUNTER — Ambulatory Visit: Payer: BC Managed Care – PPO | Admitting: Orthopedic Surgery

## 2020-07-05 VITALS — BP 176/88 | HR 81 | Ht 61.0 in | Wt 176.0 lb

## 2020-07-05 DIAGNOSIS — M1812 Unilateral primary osteoarthritis of first carpometacarpal joint, left hand: Secondary | ICD-10-CM

## 2020-07-05 DIAGNOSIS — M25532 Pain in left wrist: Secondary | ICD-10-CM

## 2020-07-05 DIAGNOSIS — G8929 Other chronic pain: Secondary | ICD-10-CM

## 2020-07-05 NOTE — Progress Notes (Signed)
Chief Complaint  Patient presents with  . Wrist Pain    chronic right thumb pain,     Assessment and plan:  Encounter Diagnoses  Name Primary?  . Acute wrist pain, left Yes  . 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  History 64 year old female who does a lot of crocheting and knitting has [place] arthritis left thumb previously treated with multiple injections and splinting and she is on oral arthritis medicine.  She put her wrist down to get something from under the bed felt acute pain and pop over the left wrist and thinks she may have broken it  System review chronic arthritis chronic joint pain no numbness or tingling  Past Medical History:  Diagnosis Date  . Arthritis   . 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 Asthma   . Asthma    Phreesia 06/16/2020  . Chronic back pain   . Chronic constipation   . Depression   . Diabetes mellitus without complication ([place])    Phreesia 06/16/2020  . Heart murmur    in childhood  . History of bronchitis   . History of urinary tract infection   . Hyperlipidemia    Phreesia 06/16/2020  . Hypertension   . Imbalance   . Meniere disease   . Neuropathic pain   . Osteoporosis    Phreesia 06/16/2020  . Parathyroid adenoma   . Poor fine motor skills    secondary to CVA per right side   . Pre-diabetes   . PTSD (post-traumatic stress disorder)   . Restless legs syndrome 2007 approx  . Seasonal allergies   . Shortness of breath dyspnea    coldness   . Stroke ([place])   . Vertigo     BP (!) 176/88   Pulse 81   Ht 5\' 1"  (1.549 m)   Wt 176 lb (79.8 kg)   BMI 33.25 kg/m   She is awake alert and oriented x3 mood and affect are normal  Neurovascular exam is intact  Tenderness over the [facility] joint left  thumb she has a small bump over the volar aspect of the left wrist near the FCR tendon  Finkelstein's is negative she has no tenderness over the first extensor compartment    Procedure note injection left thumb  Injection left CMC joint Medication Depo-Medrol 40 mg and lidocaine 1% 2 cc The patient gave verbal consent Timeout confirmed the site of injection left CMC joint  Alcohol and ethyl chloride was used to repair the skin and then a 25-gauge needle was used to inject the [facility] joint of the left thumb  There were no complications a sterile Band-Aid was applied

## 2020-07-05 NOTE — Patient Instructions (Signed)

## 2020-07-23 LAB — HM DIABETES EYE EXAM

## 2020-07-28 ENCOUNTER — Other Ambulatory Visit: Payer: Self-pay | Admitting: Family Medicine

## 2020-08-27 ENCOUNTER — Other Ambulatory Visit: Payer: Self-pay | Admitting: Family Medicine

## 2020-08-27 DIAGNOSIS — M5416 Radiculopathy, lumbar region: Secondary | ICD-10-CM | POA: Diagnosis not present

## 2020-08-27 DIAGNOSIS — I10 Essential (primary) hypertension: Secondary | ICD-10-CM | POA: Diagnosis not present

## 2020-08-27 DIAGNOSIS — Z6834 Body mass index (BMI) 34.0-34.9, adult: Secondary | ICD-10-CM | POA: Diagnosis not present

## 2020-08-30 ENCOUNTER — Other Ambulatory Visit (HOSPITAL_COMMUNITY): Payer: Self-pay | Admitting: Neurosurgery

## 2020-08-30 ENCOUNTER — Other Ambulatory Visit: Payer: Self-pay | Admitting: Neurosurgery

## 2020-08-30 DIAGNOSIS — M545 Low back pain, unspecified: Secondary | ICD-10-CM

## 2020-09-13 ENCOUNTER — Other Ambulatory Visit: Payer: Self-pay

## 2020-09-13 ENCOUNTER — Ambulatory Visit (HOSPITAL_COMMUNITY)
Admission: RE | Admit: 2020-09-13 | Discharge: 2020-09-13 | Disposition: A | Discharge status: Home / Self Care | Payer: BC Managed Care – PPO | Source: Ambulatory Visit | Attending: Neurosurgery | Admitting: Neurosurgery

## 2020-09-13 DIAGNOSIS — M545 Low back pain, unspecified: Secondary | ICD-10-CM | POA: Diagnosis not present

## 2020-09-28 ENCOUNTER — Other Ambulatory Visit: Payer: Self-pay | Admitting: Family Medicine

## 2020-10-27 ENCOUNTER — Other Ambulatory Visit: Payer: Self-pay | Admitting: Family Medicine

## 2020-11-28 ENCOUNTER — Other Ambulatory Visit: Payer: Self-pay | Admitting: Family Medicine

## 2020-12-25 ENCOUNTER — Other Ambulatory Visit: Payer: Self-pay | Admitting: Family Medicine

## 2021-01-04 ENCOUNTER — Ambulatory Visit: Payer: BC Managed Care – PPO | Admitting: Family Medicine

## 2021-01-14 ENCOUNTER — Other Ambulatory Visit: Payer: Self-pay | Admitting: *Deleted

## 2021-01-14 MED ORDER — ONDANSETRON HCL 4 MG PO TABS: 4.0000 mg | ORAL_TABLET | Freq: Three times a day (TID) | ORAL | 0 refills | Status: DC | PRN

## 2021-02-23 ENCOUNTER — Other Ambulatory Visit: Payer: Self-pay | Admitting: Family Medicine

## 2021-03-29 ENCOUNTER — Other Ambulatory Visit: Payer: Self-pay

## 2021-03-29 MED ORDER — FENOFIBRATE 145 MG PO TABS: 145.0000 mg | ORAL_TABLET | Freq: Every day | ORAL | 0 refills | Status: DC

## 2021-03-29 MED ORDER — LISINOPRIL 10 MG PO TABS
1.0000 | ORAL_TABLET | Freq: Every day | ORAL | 0 refills | Status: DC
Start: 2021-03-29 — End: 2021-05-13

## 2021-03-29 MED ORDER — PRAMIPEXOLE DIHYDROCHLORIDE 0.25 MG PO TABS: 0.2500 mg | ORAL_TABLET | Freq: Every day | ORAL | 0 refills | Status: DC

## 2021-03-29 MED ORDER — ESCITALOPRAM OXALATE 20 MG PO TABS: 1.0000 | ORAL_TABLET | Freq: Every day | ORAL | 0 refills | Status: DC

## 2021-05-10 DIAGNOSIS — M5416 Radiculopathy, lumbar region: Secondary | ICD-10-CM | POA: Diagnosis not present

## 2021-05-10 DIAGNOSIS — I10 Essential (primary) hypertension: Secondary | ICD-10-CM | POA: Diagnosis not present

## 2021-05-10 DIAGNOSIS — Z6833 Body mass index (BMI) 33.0-33.9, adult: Secondary | ICD-10-CM | POA: Diagnosis not present

## 2021-05-13 ENCOUNTER — Other Ambulatory Visit: Payer: Self-pay

## 2021-05-13 ENCOUNTER — Ambulatory Visit: Payer: BC Managed Care – PPO | Admitting: Nurse Practitioner

## 2021-05-13 ENCOUNTER — Encounter: Payer: Self-pay | Admitting: Nurse Practitioner

## 2021-05-13 VITALS — BP 142/81 | HR 89 | Temp 98.0°F | Resp 20 | Ht 61.0 in | Wt 179.0 lb

## 2021-05-13 DIAGNOSIS — F339 Major depressive disorder, recurrent, unspecified: Secondary | ICD-10-CM | POA: Insufficient documentation

## 2021-05-13 DIAGNOSIS — E119 Type 2 diabetes mellitus without complications: Secondary | ICD-10-CM

## 2021-05-13 DIAGNOSIS — E782 Mixed hyperlipidemia: Secondary | ICD-10-CM

## 2021-05-13 DIAGNOSIS — I10 Essential (primary) hypertension: Secondary | ICD-10-CM | POA: Diagnosis not present

## 2021-05-13 DIAGNOSIS — J45909 Unspecified asthma, uncomplicated: Secondary | ICD-10-CM

## 2021-05-13 MED ORDER — FENOFIBRATE 145 MG PO TABS: 145.0000 mg | ORAL_TABLET | Freq: Every day | ORAL | 0 refills | Status: DC

## 2021-05-13 MED ORDER — LISINOPRIL 10 MG PO TABS: 10.0000 mg | ORAL_TABLET | Freq: Every day | ORAL | 0 refills | Status: DC

## 2021-05-13 MED ORDER — GABAPENTIN 300 MG PO CAPS: 300.0000 mg | ORAL_CAPSULE | Freq: Two times a day (BID) | ORAL | 0 refills | Status: DC

## 2021-05-13 MED ORDER — EMPAGLIFLOZIN 10 MG PO TABS: 10.0000 mg | ORAL_TABLET | Freq: Every day | ORAL | 0 refills | Status: DC

## 2021-05-13 MED ORDER — ESCITALOPRAM OXALATE 20 MG PO TABS: 20.0000 mg | ORAL_TABLET | Freq: Every day | ORAL | 0 refills | Status: DC

## 2021-05-13 MED ORDER — PROAIR RESPICLICK 108 (90 BASE) MCG/ACT IN AEPB: 2.0000 | INHALATION_SPRAY | RESPIRATORY_TRACT | 1 refills | Status: DC | PRN

## 2021-05-13 NOTE — Assessment & Plan Note (Signed)
>>  ASSESSMENT AND PLAN FOR DEPRESSION, RECURRENT WRITTEN ON 05/13/2021  1:47 PM BY IJAOLA, ONYEJE M, NP  No changes to current dose.  Completed PHQ-9.  Rx refill sent to pharmacy.

## 2021-05-13 NOTE — Assessment & Plan Note (Signed)
A1c completed results pending, education provided to patient,t diabetic diet, exercise and weight loss,RX refill sent to pharmacy for Jardiance 10 mg tablet by mouth daily.  Continue diabetic diet, exercise and weight loss.  Printed handouts given

## 2021-05-13 NOTE — Patient Instructions (Signed)
Diabetes Mellitus Basics  Diabetes mellitus, or diabetes, is a long-term (chronic) disease. It occurs when the body does not properly use sugar (glucose) that is released from food after you eat. Diabetes mellitus may be caused by one or both of these problems: Your pancreas does not make enough of a hormone called insulin. Your body does not react in a normal way to the insulin that it makes. Insulin lets glucose enter cells in your body. This gives you energy. If you have diabetes, glucose cannot get into cells. This causes high blood glucose (hyperglycemia). How to treat and manage diabetes You may need to take insulin or other diabetes medicines daily to keep your glucose in balance. If you are prescribed insulin, you will learn how to give yourself insulin by injection. You may need to adjust the amount of insulin youtake based on the foods that you eat. You will need to check your blood glucose levels using a glucose monitor as told by your health care provider. The readings can help determine if you havelow or high blood glucose. Generally, you should have these blood glucose levels: Before meals (preprandial): 80-130 mg/dL (4.4-7.2 mmol/L). After meals (postprandial): below 180 mg/dL (10 mmol/L). Hemoglobin A1c (HbA1c) level: less than 7%. Your health care provider will set treatment goals for you. Keep all follow-up visits. This is important. Follow these instructions at home: Diabetes medicines Take your diabetes medicines every day as told by your health care provider. List your diabetes medicines here: Name of medicine: ______________________________ Amount (dose): _______________ Time (a.m./p.m.): _______________ Notes: ___________________________________ Name of medicine: ______________________________ Amount (dose): _______________ Time (a.m./p.m.): _______________ Notes: ___________________________________ Name of medicine: ______________________________ Amount (dose):  _______________ Time (a.m./p.m.): _______________ Notes: ___________________________________ Insulin If you use insulin, list the types of insulin you use here: Insulin type: ______________________________ Amount (dose): _______________ Time (a.m./p.m.): _______________Notes: ___________________________________ Insulin type: ______________________________ Amount (dose): _______________ Time (a.m./p.m.): _______________ Notes: ___________________________________ Insulin type: ______________________________ Amount (dose): _______________ Time (a.m./p.m.): _______________ Notes: ___________________________________ Insulin type: ______________________________ Amount (dose): _______________ Time (a.m./p.m.): _______________ Notes: ___________________________________ Insulin type: ______________________________ Amount (dose): _______________ Time (a.m./p.m.): _______________ Notes: ___________________________________ Managing blood glucose  Check your blood glucose levels using a glucose monitor as told by your healthcare provider. Write down the times that you check your glucose levels here: Time: _______________ Notes: ___________________________________ Time: _______________ Notes: ___________________________________ Time: _______________ Notes: ___________________________________ Time: _______________ Notes: ___________________________________ Time: _______________ Notes: ___________________________________ Time: _______________ Notes: ___________________________________  Low blood glucose Low blood glucose (hypoglycemia) is when glucose is at or below 70 mg/dL (3.9 mmol/L). Symptoms may include: Feeling: Hungry. Sweaty and clammy. Irritable or easily upset. Dizzy. Sleepy. Having: A fast heartbeat. A headache. A change in your vision. Numbness around the mouth, lips, or tongue. Having trouble with: Moving (coordination). Sleeping. Treating low blood glucose To treat low blood  glucose, eat or drink something containing sugar right away. If you can think clearly and swallow safely, follow the 15:15 rule: Take 15 grams of a fast-acting carb (carbohydrate), as told by your health care provider. Some fast-acting carbs are: Glucose tablets: take 3-4 tablets. Hard candy: eat 3-5 pieces. Fruit juice: drink 4 oz (120 mL). Regular (not diet) soda: drink 4-6 oz (120-180 mL). Honey or sugar: eat 1 Tbsp (15 mL). Check your blood glucose levels 15 minutes after you take the carb. If your glucose is still at or below 70 mg/dL (3.9 mmol/L), take 15 grams of a carb again. If your glucose does not go above 70 mg/dL (3.9 mmol/L) after 3 tries, get  help right away. After your glucose goes back to normal, eat a meal or a snack within 1 hour. Treating very low blood glucose If your glucose is at or below 54 mg/dL (3 mmol/L), you have very low blood glucose (severe hypoglycemia). This is an emergency. Do not wait to see if the symptoms will go away. Get medical help right away. Call your local emergency services (911 in the [place] Do not drive yourself to the hospital. Questions to ask your health care provider Should I talk with a diabetes educator? What equipment will I need to care for myself at home? What diabetes medicines do I need? When should I take them? How often do I need to check my blood glucose levels? What number can I call if I have questions? When is my follow-up visit? Where can I find a support group for people with diabetes? Where to find more information American Diabetes Association: www.diabetes.org Association of Diabetes Care and Education Specialists: www.diabeteseducator.org Contact a health care provider if: Your blood glucose is at or above 240 mg/dL (13.3 mmol/L) for 2 days in a row. You have been sick or have had a fever for 2 days or more, and you are not getting better. You have any of these problems for more than 6 hours: You cannot eat or  drink. You feel nauseous. You vomit. You have diarrhea. Get help right away if: Your blood glucose is lower than 54 mg/dL (3 mmol/L). You get confused. You have trouble thinking clearly. You have trouble breathing. These symptoms may represent a serious problem that is an emergency. Do not wait to see if the symptoms will go away. Get medical help right away. Call your local emergency services (911 in the [place] Do not drive yourself to the hospital. Summary Diabetes mellitus is a chronic disease that occurs when the body does not properly use sugar (glucose) that is released from food after you eat. Take insulin and diabetes medicines as told. Check your blood glucose every day, as often as told. Keep all follow-up visits. This is important. This information is not intended to replace advice given to you by your health care provider. Make sure you discuss any questions you have with your healthcare provider. Document Revised: 01/27/2020 Document Reviewed: 01/27/2020 Elsevier Patient Education  [place]. High Cholesterol  High cholesterol is a condition in which the blood has high levels of a white, waxy substance similar to fat (cholesterol). The liver makes all the cholesterol that the body needs. The human body needs small amounts of cholesterol to help build cells. A person gets extra orexcess cholesterol from the food that he or she eats. The blood carries cholesterol from the liver to the rest of the body. If you have high cholesterol, deposits (plaques) may build up on the walls of your arteries. Arteries are the blood vessels that carry blood away from your heart. These plaques make the arteries narrowand stiff. Cholesterol plaques increase your risk for heart attack and stroke. Work withyour health care provider to keep your cholesterol levels in a healthy range. What increases the risk? The following factors may make you more likely to develop this condition: Eating foods  that are high in animal fat (saturated fat) or cholesterol. Being overweight. Not getting enough exercise. A family history of high cholesterol (familial hypercholesterolemia). Use of tobacco products. Having diabetes. What are the signs or symptoms? There are no symptoms of this condition. How is this diagnosed? This condition may be diagnosed  based on the results of a blood test. If you are older than 64 years of age, your health care provider may check your cholesterol levels every 4-6 years. You may be checked more often if you have high cholesterol or other risk factors for heart disease. The blood test for cholesterol measures: "Bad" cholesterol, or LDL cholesterol. This is the main type of cholesterol that causes heart disease. The desired level is less than 100 mg/dL. "Good" cholesterol, or HDL cholesterol. HDL helps protect against heart disease by cleaning the arteries and carrying the LDL to the liver for processing. The desired level for HDL is 60 mg/dL or higher. Triglycerides. These are fats that your body can store or burn for energy. The desired level is less than 150 mg/dL. Total cholesterol. This measures the total amount of cholesterol in your blood and includes LDL, HDL, and triglycerides. The desired level is less than 200 mg/dL. How is this treated? This condition may be treated with: Diet changes. You may be asked to eat foods that have more fiber and less saturated fats or added sugar. Lifestyle changes. These may include regular exercise, maintaining a healthy weight, and quitting use of tobacco products. Medicines. These are given when diet and lifestyle changes have not worked. You may be prescribed a statin medicine to help lower your cholesterol levels. Follow these instructions at home: Eating and drinking  Eat a healthy, balanced diet. This diet includes: Daily servings of a variety of fresh, frozen, or canned fruits and vegetables. Daily servings of whole  grain foods that are rich in fiber. Foods that are low in saturated fats and trans fats. These include poultry and fish without skin, lean cuts of meat, and low-fat dairy products. A variety of fish, especially oily fish that contain omega-3 fatty acids. Aim to eat fish at least 2 times a week. Avoid foods and drinks that have added sugar. Use healthy cooking methods, such as roasting, grilling, broiling, baking, poaching, steaming, and stir-frying. Do not fry your food except for stir-frying.  Lifestyle  Get regular exercise. Aim to exercise for a total of 150 minutes a week. Increase your activity level by doing activities such as gardening, walking, and taking the stairs. Do not use any products that contain nicotine or tobacco, such as cigarettes, e-cigarettes, and chewing tobacco. If you need help quitting, ask your health care provider.  General instructions Take over-the-counter and prescription medicines only as told by your health care provider. Keep all follow-up visits as told by your health care provider. This is important. Where to find more information American Heart Association: www.heart.org National Heart, Lung, and Blood Institute: https://wilson-eaton.com/ Contact a health care provider if: You have trouble achieving or maintaining a healthy diet or weight. You are starting an exercise program. You are unable to stop smoking. Get help right away if: You have chest pain. You have trouble breathing. You have any symptoms of a stroke. "BE FAST" is an easy way to remember the main warning signs of a stroke: B - Balance. Signs are dizziness, sudden trouble walking, or loss of balance. E - Eyes. Signs are trouble seeing or a sudden change in vision. F - Face. Signs are sudden weakness or numbness of the face, or the face or eyelid drooping on one side. A - Arms. Signs are weakness or numbness in an arm. This happens suddenly and usually on one side of the body. S - Speech. Signs are  sudden trouble speaking, slurred speech, or trouble  understanding what people say. T - Time. Time to call emergency services. Write down what time symptoms started. You have other signs of a stroke, such as: A sudden, severe headache with no known cause. Nausea or vomiting. Seizure. These symptoms may represent a serious problem that is an emergency. Do not wait to see if the symptoms will go away. Get medical help right away. Call your local emergency services (911 in the [place] Do not drive yourself to the hospital. Summary Cholesterol plaques increase your risk for heart attack and stroke. Work with your health care provider to keep your cholesterol levels in a healthy range. Eat a healthy, balanced diet, get regular exercise, and maintain a healthy weight. Do not use any products that contain nicotine or tobacco, such as cigarettes, e-cigarettes, and chewing tobacco. Get help right away if you have any symptoms of a stroke. This information is not intended to replace advice given to you by your health care provider. Make sure you discuss any questions you have with your healthcare provider. Document Revised: 08/25/2019 Document Reviewed: 08/25/2019 Elsevier Patient Education  2022 [place]. Hypertension, Adult Hypertension is another name for high blood pressure. High blood pressure forces your heart to work harder to pump blood. This can cause problems overtime. There are two numbers in a blood pressure reading. There is a top number (systolic) over a bottom number (diastolic). It is best to have a blood pressure that is below 120/80. Healthy choicescan help lower your blood pressure, or you may need medicine to help lower it. What are the causes? The cause of this condition is not known. Some conditions may be related tohigh blood pressure. What increases the risk? Smoking. Having type 2 diabetes mellitus, high cholesterol, or both. Not getting enough exercise or physical  activity. Being overweight. Having too much fat, sugar, calories, or salt (sodium) in your diet. Drinking too much alcohol. Having long-term (chronic) kidney disease. Having a family history of high blood pressure. Age. Risk increases with age. Race. You may be at higher risk if you are African American. Gender. Men are at higher risk than women before age 73. After age 66, women are at higher risk than men. Having obstructive sleep apnea. Stress. What are the signs or symptoms? High blood pressure may not cause symptoms. Very high blood pressure (hypertensive crisis) may cause: Headache. Feelings of worry or nervousness (anxiety). Shortness of breath. Nosebleed. A feeling of being sick to your stomach (nausea). Throwing up (vomiting). Changes in how you see. Very bad chest pain. Seizures. How is this treated? This condition is treated by making healthy lifestyle changes, such as: Eating healthy foods. Exercising more. Drinking less alcohol. Your health care provider may prescribe medicine if lifestyle changes are not enough to get your blood pressure under control, and if: Your top number is above 130. Your bottom number is above 80. Your personal target blood pressure may vary. Follow these instructions at home: Eating and drinking  If told, follow the DASH eating plan. To follow this plan: Fill one half of your plate at each meal with fruits and vegetables. Fill one fourth of your plate at each meal with whole grains. Whole grains include whole-wheat pasta, brown rice, and whole-grain bread. Eat or drink low-fat dairy products, such as skim milk or low-fat yogurt. Fill one fourth of your plate at each meal with low-fat (lean) proteins. Low-fat proteins include fish, chicken without skin, eggs, beans, and tofu. Avoid fatty meat, cured and processed meat, or  chicken with skin. Avoid pre-made or processed food. Eat less than 1,500 mg of salt each day. Do not drink alcohol  if: Your doctor tells you not to drink. You are pregnant, may be pregnant, or are planning to become pregnant. If you drink alcohol: Limit how much you use to: 0-1 drink a day for women. 0-2 drinks a day for men. Be aware of how much alcohol is in your drink. In the [place] one drink equals one 12 oz bottle of beer (355 mL), one 5 oz glass of wine (148 mL), or one 1 oz glass of hard liquor (44 mL).  Lifestyle  Work with your doctor to stay at a healthy weight or to lose weight. Ask your doctor what the best weight is for you. Get at least 30 minutes of exercise most days of the week. This may include walking, swimming, or biking. Get at least 30 minutes of exercise that strengthens your muscles (resistance exercise) at least 3 days a week. This may include lifting weights or doing Pilates. Do not use any products that contain nicotine or tobacco, such as cigarettes, e-cigarettes, and chewing tobacco. If you need help quitting, ask your doctor. Check your blood pressure at home as told by your doctor. Keep all follow-up visits as told by your doctor. This is important.  Medicines Take over-the-counter and prescription medicines only as told by your doctor. Follow directions carefully. Do not skip doses of blood pressure medicine. The medicine does not work as well if you skip doses. Skipping doses also puts you at risk for problems. Ask your doctor about side effects or reactions to medicines that you should watch for. Contact a doctor if you: Think you are having a reaction to the medicine you are taking. Have headaches that keep coming back (recurring). Feel dizzy. Have swelling in your ankles. Have trouble with your vision. Get help right away if you: Get a very bad headache. Start to feel mixed up (confused). Feel weak or numb. Feel faint. Have very bad pain in your: Chest. Belly (abdomen). Throw up more than once. Have trouble breathing. Summary Hypertension is another name  for high blood pressure. High blood pressure forces your heart to work harder to pump blood. For most people, a normal blood pressure is less than 120/80. Making healthy choices can help lower blood pressure. If your blood pressure does not get lower with healthy choices, you may need to take medicine. This information is not intended to replace advice given to you by your health care provider. Make sure you discuss any questions you have with your healthcare provider. Document Revised: 06/05/2018 Document Reviewed: 06/05/2018 Elsevier Patient Education  [place]. http://APA.org/depression-guideline"> https://clinicalkey.com"> http://point-of-care.elsevierperformancemanager.com/skills/"> http://point-of-care.elsevierperformancemanager.com">  Managing Depression, Adult Depression is a mental health condition that affects your thoughts, feelings, and actions. Being diagnosed with depression can bring you relief if you did not know why you have felt or behaved a certain way. It could also leave you feeling overwhelmed with uncertainty about your future. Preparing yourself tomanage your symptoms can help you feel more positive about your future. How to manage lifestyle changes Managing stress  Stress is your body's reaction to life changes and events, both good and bad. Stress can add to your feelings of depression. Learning to manage your stresscan help lessen your feelings of depression. Try some of the following approaches to reducing your stress (stress reduction techniques): Listen to music that you enjoy and that inspires you. Try using a meditation app or  take a meditation class. Develop a practice that helps you connect with your spiritual self. Walk in nature, pray, or go to a place of worship. Do some deep breathing. To do this, inhale slowly through your nose. Pause at the top of your inhale for a few seconds and then exhale slowly, letting your muscles relax. Practice yoga to  help relax and work your muscles. Choose a stress reduction technique that suits your lifestyle and personality. These techniques take time and practice to develop. Set aside 5-15 minutes a day to do them. Therapists can offer training in these techniques. Other things you can do to manage stress include: Keeping a stress diary. Knowing your limits and saying no when you think something is too much. Paying attention to how you react to certain situations. You may not be able to control everything, but you can change your reaction. Adding humor to your life by watching funny films or TV shows. Making time for activities that you enjoy and that relax you.  Medicines Medicines, such as antidepressants, are often a part of treatment for depression. Talk with your pharmacist or health care provider about all the medicines, supplements, and herbal products that you take, their possible side effects, and what medicines and other products are safe to take together. Make sure to report any side effects you may have to your health care provider. Relationships Your health care provider may suggest family therapy, couples therapy, orindividual therapy as part of your treatment. How to recognize changes Everyone responds differently to treatment for depression. As you recover from depression, you may start to: Have more interest in doing activities. Feel less hopeless. Have more energy. Overeat less often, or have a better appetite. Have better mental focus. It is important to recognize if your depression is not getting better or is getting worse. The symptoms you had in the beginning may return, such as: Tiredness (fatigue) or low energy. Eating too much or too little. Sleeping too much or too little. Feeling restless, agitated, or hopeless. Trouble focusing or making decisions. Unexplained physical complaints. Feeling irritable, angry, or aggressive. If you or your family members notice these  symptoms coming back, let yourhealth care provider know right away. Follow these instructions at home: Activity  Try to get some form of exercise each day, such as walking, biking, swimming, or lifting weights. Practice stress reduction techniques. Engage your mind by taking a class or doing some volunteer work.  Lifestyle Get the right amount and quality of sleep. Cut down on using caffeine, tobacco, alcohol, and other potentially harmful substances. Eat a healthy diet that includes plenty of vegetables, fruits, whole grains, low-fat dairy products, and lean protein. Do not eat a lot of foods that are high in solid fats, added sugars, or salt (sodium). General instructions Take over-the-counter and prescription medicines only as told by your health care provider. Keep all follow-up visits as told by your health care provider. This is important. Where to find support Talking to others  Friends and family members can be sources of support and guidance. Talk to trusted friends or family members about your condition. Explain your symptoms to them, and let them know that you are working with a health care provider to treat your depression. Tell friends and family members how they also can behelpful. Finances Find appropriate mental health providers that fit with your financial situation. Talk with your health care provider about options to get reduced prices on your medicines. Where to find more information  You can find support in your area from: Anxiety and Depression Association of America (ADAA): www.adaa.org Mental Health America: www.mentalhealthamerica.net [employer] on Mental Illness: www.nami.org Contact a health care provider if: You stop taking your antidepressant medicines, and you have any of these symptoms: Nausea. Headache. Light-headedness. Chills and body aches. Not being able to sleep (insomnia). You or your friends and family think your depression is getting  worse. Get help right away if: You have thoughts of hurting yourself or others. If you ever feel like you may hurt yourself or others, or have thoughts about taking your own life, get help right away. Go to your nearest emergency department or: Call your local emergency services (911 in the [place] Call a suicide crisis helpline, such as the [facility] at (779)298-7892. This is open 24 hours a day in the [place] Text the Crisis Text Line at 365-118-8028 (in the [place].). Summary If you are diagnosed with depression, preparing yourself to manage your symptoms is a good way to feel positive about your future. Work with your health care provider on a management plan that includes stress reduction techniques, medicines (if applicable), therapy, and healthy lifestyle habits. Keep talking with your health care provider about how your treatment is working. If you have thoughts about taking your own life, call a suicide crisis helpline or text a crisis text line. This information is not intended to replace advice given to you by your health care provider. Make sure you discuss any questions you have with your healthcare provider. Document Revised: 08/06/2019 Document Reviewed: 08/06/2019 Elsevier Patient Education  2022 [employer].

## 2021-05-13 NOTE — Progress Notes (Signed)
New Patient Note  RE: Teresa Soto MRN: [id number] DOB: 09-Nov-1955 Date of Office Visit: 05/13/2021  Chief Complaint: new est care, Diabetes, Hypertension, Hyperlipidemia, and Depression  History of Present Illness:  Depression        This is a recurrent problem.  The current episode started more than 1 year ago.   The onset quality is gradual.   The problem occurs constantly.  The problem has been gradually improving since onset.  Associated symptoms include decreased concentration, insomnia and decreased interest.  Associated symptoms include no headaches.  Past treatments include SSRIs - Selective serotonin reuptake inhibitors.  Compliance with treatment is good.  Previous treatment provided moderate relief.  Past medical history includes chronic pain.   Hypertension This is a chronic problem. The current episode started more than 1 year ago. The problem has been gradually improving since onset. The problem is controlled. Pertinent negatives include no blurred vision, headaches, malaise/fatigue, peripheral edema or shortness of breath. Risk factors for coronary artery disease include diabetes mellitus and dyslipidemia. Past treatments include beta blockers. The current treatment provides significant improvement. There are no compliance problems.   Hyperlipidemia This is a chronic problem. The current episode started more than 1 year ago. The problem is controlled. Recent lipid tests were reviewed and are variable. Exacerbating diseases include diabetes. Pertinent negatives include no shortness of breath. Treatments tried: fenofibrate. The current treatment provides moderate improvement of lipids. There are no compliance problems.  Risk factors for coronary artery disease include diabetes mellitus, dyslipidemia, post-menopausal and hypertension.  Diabetes She presents for her follow-up diabetic visit. She has type 2 diabetes mellitus. The initial diagnosis of diabetes was made 4 years ago. Her  disease course has been stable. There are no hypoglycemic associated symptoms. Pertinent negatives for hypoglycemia include no confusion or headaches. Associated symptoms include foot paresthesias. Pertinent negatives for diabetes include no blurred vision, no foot ulcerations, no polydipsia and no polyphagia. There are no hypoglycemic complications. Pertinent negatives for hypoglycemia complications include no blackouts. Diabetic complications include peripheral neuropathy. Risk factors for coronary artery disease include diabetes mellitus, hypertension and dyslipidemia. Current diabetic treatment includes diet (Empagliflozin). She is compliant with treatment most of the time. Her weight is stable. She is following a generally healthy diet. When asked about meal planning, she reported none. She has not had a previous visit with a dietitian. She participates in exercise intermittently. Home blood sugar record trend: A1c completed will reassess. An ACE inhibitor/angiotensin II receptor blocker is being taken. Eye exam is current.      [place] Office Visit from 05/13/2021 in [facility]  PHQ-9 Total Score 10        Assessment and Plan: Teresa Soto is a 65 y.o. female with: Essential hypertension New patient establishing care-completed CBC CMP lipid panel results pending education provided, continue low-sodium diet, follow-up in 3 months.  No changes to current medication dose.  Rx refill sent to pharmacy  Asthma No new exacerbation.  Continue current dose Rx refill sent to pharmacy.  Diabetes mellitus without complication ([place]) Q3E completed results pending, education provided to patient,t diabetic diet, exercise and weight loss,RX refill sent to pharmacy for Jardiance 10 mg tablet by mouth daily.  Continue diabetic diet, exercise and weight loss.  Printed handouts given  Mixed hyperlipidemia No new signs and symptoms of hyperlipidemia, completed education printed  handouts given home low-cholesterol diet, lipid panel completed-results pending.  Rx refill sent to pharmacy for fenofibrate 145 mg tablet  Depression,  recurrent ([place]) No changes to current dose.  Completed PHQ-9.  Rx refill sent to pharmacy.  Return in about 3 months (around 08/13/2021).   Diagnostics:   Past Medical History: Patient Active Problem List   Diagnosis Date Noted   Depression, recurrent ([place]) 05/13/2021   Metatarsal stress fracture with routine healing 12/30/2018   Diabetes mellitus without complication ([place]) 62/37/6283   Osteoporosis 04/03/2016   Arthritis of knee, right 08/12/2014   Primary osteoarthritis of right knee 07/27/2014   OA (osteoarthritis) of knee 06/16/2014   Hyperparathyroidism, primary ([place]) 06/16/2014   Tinea versicolor 06/16/2014   Obesity 04/06/2013   Lack of coordination 10/21/2012   History of stroke 10/10/2012   Vitamin D deficiency 11/10/2011   Hearing loss 06/29/2011   Carotid bruit 06/29/2011   BACK PAIN, LUMBAR, WITH RADICULOPATHY 06/28/2010   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/04/2010   PERIPHERAL EDEMA 04/08/2009   DEGENERATIVE JOINT DISEASE 03/23/2008   Mixed hyperlipidemia 11/18/2007   INSOMNIA 10/17/2007   Asthma 11/30/2006   Essential hypertension 09/05/2006   ARTHRITIS 09/05/2006   VERTIGO 09/05/2006   Past Medical History:  Diagnosis Date   Arthritis    Arthritis of knee, right 08/12/2014   Asthma    Asthma    [employer] 06/16/2020   Chronic back pain    Chronic constipation    Depression    Diabetes mellitus without complication ([place])    [employer] 06/16/2020   Heart murmur    in childhood   History of bronchitis    History of urinary tract infection    Hyperlipidemia    [employer] 06/16/2020   Hypertension    Imbalance    Meniere disease    Neuropathic pain    Osteoporosis    [employer] 06/16/2020   Parathyroid adenoma    Poor fine motor skills    secondary to CVA per right side    Pre-diabetes    PTSD (post-traumatic  stress disorder)    Restless legs syndrome 2007 approx   Seasonal allergies    Shortness of breath dyspnea    coldness    Stroke ([place])    Vertigo    Past Surgical History: Past Surgical History:  Procedure Laterality Date   BACK SURGERY     CESAREAN SECTION     x 2   CESAREAN SECTION N/A    [employer] 06/16/2020   JOINT REPLACEMENT N/A    [employer] 06/16/2020   PARATHYROIDECTOMY N/A 06/27/2016   Procedure: MINIMALLY INVASIVE PARATHYROIDECTOMY;  Surgeon: Greer Pickerel, MD;  Location: [facility];  Service: General;  Laterality: N/A;   SHOULDER ARTHROSCOPY W/ ROTATOR CUFF REPAIR     to right shoulder   SPINE SURGERY  12/2010   ruptd L1 L2 , Dr Carloyn Manner   TONSILLECTOMY     TOTAL KNEE ARTHROPLASTY Right 08/12/2014   Procedure: RIGHT TOTAL KNEE ARTHROPLASTY;  Surgeon: Carole Civil, MD;  Location: [facility];  Service: Orthopedics;  Laterality: Right;   TUBAL LIGATION     Medication List:  Current Outpatient Medications  Medication Sig Dispense Refill   aspirin EC 81 MG tablet Take 81 mg by mouth daily. Swallow whole.     BIOTIN PO Take 1 tablet by mouth every evening.      Blood Glucose Monitoring Suppl (BLOOD GLUCOSE SYSTEM PAK) KIT Please dispense based on patient and insurance preference. Use as directed to monitor FSBS 1x daily. Dx: E11.9. 1 each 1   Lancet Devices MISC Please dispense based on patient and insurance preference. Use as directed to  monitor FSBS 1x daily. Dx: E11.9. 1 each 1   Lancets MISC Please dispense based on patient and insurance preference. Use as directed to monitor FSBS 1x daily. Dx: E11.9. 100 each 1   montelukast (SINGULAIR) 10 MG tablet Take 1 tablet by mouth every evening. 90 tablet 0   polyethylene glycol (MIRALAX / GLYCOLAX) packet Take 17 g by mouth daily. (Patient taking differently: Take 17 g by mouth every other day.) 14 each 0   pramipexole (MIRAPEX) 0.25 MG tablet Take 1 tablet (0.25 mg total) by mouth daily with breakfast. 90 tablet 0   Albuterol Sulfate  (PROAIR RESPICLICK) 831 (90 Base) MCG/ACT AEPB Inhale 2 puffs into the lungs every 4 (four) hours as needed. For shortness of breath. 3 each 1   empagliflozin (JARDIANCE) 10 MG TABS tablet Take 1 tablet (10 mg total) by mouth daily. 90 tablet 0   escitalopram (LEXAPRO) 20 MG tablet Take 1 tablet (20 mg total) by mouth daily. 90 tablet 0   fenofibrate (TRICOR) 145 MG tablet Take 1 tablet (145 mg total) by mouth daily. 90 tablet 0   gabapentin (NEURONTIN) 300 MG capsule Take 1 capsule (300 mg total) by mouth 2 (two) times daily. 180 capsule 0   lisinopril (ZESTRIL) 10 MG tablet Take 1 tablet (10 mg total) by mouth daily. 90 tablet 0   No current facility-administered medications for this visit.   Allergies: Allergies  Allergen Reactions   Other Nausea And Vomiting    Peppers   Pneumococcal Vaccines     Had rash, given both flu and PNA at same time    Statins    Influenza Vaccines Rash   Social History: Social History   Socioeconomic History   Marital status: Married    Spouse name: Not on file   Number of children: Not on file   Years of education: Not on file   Highest education level: Not on file  Occupational History   Not on file  Tobacco Use   Smoking status: Former    Packs/day: 2.00    Years: 6.00    Pack years: 12.00    Types: Cigarettes    Quit date: 10/09/1982    Years since quitting: 38.6   Smokeless tobacco: Never   Tobacco comments:    quit in 1984  Substance and Sexual Activity   Alcohol use: Yes    Comment: occasionally   Drug use: No   Sexual activity: Yes    Birth control/protection: Surgical  Other Topics Concern   Not on file  Social History Narrative   Not on file   Social Determinants of Health   Financial Resource Strain: Not on file  Food Insecurity: Not on file  Transportation Needs: Not on file  Physical Activity: Not on file  Stress: Not on file  Social Connections: Not on file       Family History: Family History  Problem  Relation Age of Onset   Dementia Paternal Grandmother          Review of Systems  Constitutional:  Negative for malaise/fatigue.  HENT: Negative.    Eyes: Negative.  Negative for blurred vision.  Respiratory: Negative.  Negative for shortness of breath.   Endocrine: Negative for polydipsia and polyphagia.  Genitourinary: Negative.   Musculoskeletal: Negative.   Skin:  Negative for rash.  Neurological: Negative.  Negative for headaches.  Psychiatric/Behavioral:  Positive for decreased concentration and depression. Negative for confusion. The patient has insomnia.   All other systems  reviewed and are negative.   Objective: BP (!) 142/81   Pulse 89   Temp 98 F (36.7 C)   Resp 20   Ht _0  (1.549 m)   Wt 179 lb (81.2 kg)   SpO2 98%   BMI 33.82 kg/m  Body mass index is 33.82 kg/m. Physical Exam Vitals and nursing note reviewed.  Constitutional:      Appearance: Normal appearance.  HENT:     Head: Normocephalic.     Nose: Nose normal.  Eyes:     Conjunctiva/sclera: Conjunctivae normal.  Cardiovascular:     Rate and Rhythm: Normal rate and regular rhythm.     Pulses: Normal pulses.     Heart sounds: Normal heart sounds.  Pulmonary:     Effort: Pulmonary effort is normal.     Breath sounds: Normal breath sounds.  Abdominal:     General: Bowel sounds are normal.  Skin:    Findings: No rash.  Neurological:     Mental Status: She is alert and oriented to person, place, and time.  Psychiatric:        Attention and Perception: Attention normal.        Mood and Affect: Mood is depressed.        Behavior: Behavior normal. Behavior is cooperative.        Cognition and Memory: Cognition normal.        Judgment: Judgment normal.   The plan was reviewed with the patient/family, and all questions/concerned were addressed.  It was my pleasure to see Teresa Soto today and participate in her care. Please feel free to contact me with any questions or  concerns.  Sincerely,  Jac Canavan NP [place]

## 2021-05-13 NOTE — Assessment & Plan Note (Signed)
New patient establishing care-completed CBC CMP lipid panel results pending education provided, continue low-sodium diet, follow-up in 3 months.  No changes to current medication dose.  Rx refill sent to pharmacy

## 2021-05-13 NOTE — Assessment & Plan Note (Signed)
No new exacerbation.  Continue current dose Rx refill sent to pharmacy.

## 2021-05-13 NOTE — Assessment & Plan Note (Signed)
No new signs and symptoms of hyperlipidemia, completed education printed handouts given home low-cholesterol diet, lipid panel completed-results pending.  Rx refill sent to pharmacy for fenofibrate 145 mg tablet

## 2021-05-13 NOTE — Assessment & Plan Note (Signed)
No changes to current dose.  Completed PHQ-9.  Rx refill sent to pharmacy.

## 2021-05-14 LAB — HEMOGLOBIN A1C
Est. average glucose Bld gHb Est-mCnc: 163 mg/dL
Hgb A1c MFr Bld: 7.3 % — ABNORMAL HIGH (ref 4.8–5.6)

## 2021-05-14 LAB — COMPREHENSIVE METABOLIC PANEL
ALT: 19 IU/L (ref 0–32)
AST: 18 IU/L (ref 0–40)
Albumin/Globulin Ratio: 1.6 (ref 1.2–2.2)
Albumin: 4.1 g/dL (ref 3.8–4.8)
Alkaline Phosphatase: 48 IU/L (ref 44–121)
BUN/Creatinine Ratio: 17 (ref 12–28)
BUN: 20 mg/dL (ref 8–27)
Bilirubin Total: 0.2 mg/dL (ref 0.0–1.2)
CO2: 22 mmol/L (ref 20–29)
Calcium: 10.5 mg/dL — ABNORMAL HIGH (ref 8.7–10.3)
Chloride: 104 mmol/L (ref 96–106)
Creatinine, Ser: 1.19 mg/dL — ABNORMAL HIGH (ref 0.57–1.00)
Globulin, Total: 2.5 g/dL (ref 1.5–4.5)
Glucose: 163 mg/dL — ABNORMAL HIGH (ref 65–99)
Potassium: 4.5 mmol/L (ref 3.5–5.2)
Sodium: 138 mmol/L (ref 134–144)
Total Protein: 6.6 g/dL (ref 6.0–8.5)
eGFR: 51 mL/min/{1.73_m2} — ABNORMAL LOW (ref >59)

## 2021-05-14 LAB — CBC WITH DIFFERENTIAL/PLATELET
Basophils Absolute: 0 10*3/uL (ref 0.0–0.2)
Basos: 1 %
EOS (ABSOLUTE): 0.2 10*3/uL (ref 0.0–0.4)
Eos: 3 %
Hematocrit: 40.4 % (ref 34.0–46.6)
Hemoglobin: 13.4 g/dL (ref 11.1–15.9)
Immature Grans (Abs): 0 10*3/uL (ref 0.0–0.1)
Immature Granulocytes: 0 %
Lymphocytes Absolute: 1.8 10*3/uL (ref 0.7–3.1)
Lymphs: 32 %
MCH: 28.1 pg (ref 26.6–33.0)
MCHC: 33.2 g/dL (ref 31.5–35.7)
MCV: 85 fL (ref 79–97)
Monocytes Absolute: 0.4 10*3/uL (ref 0.1–0.9)
Monocytes: 7 %
Neutrophils Absolute: 3.1 10*3/uL (ref 1.4–7.0)
Neutrophils: 57 %
Platelets: 320 10*3/uL (ref 150–450)
RBC: 4.77 x10E6/uL (ref 3.77–5.28)
RDW: 13.3 % (ref 11.7–15.4)
WBC: 5.5 10*3/uL (ref 3.4–10.8)

## 2021-05-14 LAB — LIPID PANEL
Chol/HDL Ratio: 4.2 ratio (ref 0.0–4.4)
Cholesterol, Total: 174 mg/dL (ref 100–199)
HDL: 41 mg/dL (ref >39)
LDL Chol Calc (NIH): 99 mg/dL (ref 0–99)
Triglycerides: 198 mg/dL — ABNORMAL HIGH (ref 0–149)
VLDL Cholesterol Cal: 34 mg/dL (ref 5–40)

## 2021-05-16 ENCOUNTER — Telehealth: Payer: Self-pay | Admitting: *Deleted

## 2021-05-16 NOTE — Telephone Encounter (Signed)
Your PA has been resolved, no additional PA is required

## 2021-05-16 NOTE — Telephone Encounter (Signed)
NOYA GRIFFUS  [id number]   Rx #: [id number]  Sent to plan

## 2021-05-18 ENCOUNTER — Other Ambulatory Visit: Payer: Self-pay

## 2021-05-24 ENCOUNTER — Other Ambulatory Visit: Payer: Self-pay | Admitting: Family Medicine

## 2021-05-24 DIAGNOSIS — E119 Type 2 diabetes mellitus without complications: Secondary | ICD-10-CM

## 2021-05-30 DIAGNOSIS — H9311 Tinnitus, right ear: Secondary | ICD-10-CM | POA: Diagnosis not present

## 2021-05-30 DIAGNOSIS — H8101 Meniere's disease, right ear: Secondary | ICD-10-CM | POA: Diagnosis not present

## 2021-05-30 DIAGNOSIS — H9041 Sensorineural hearing loss, unilateral, right ear, 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: Secondary | ICD-10-CM | POA: Diagnosis not present

## 2021-05-30 DIAGNOSIS — R42 Dizziness and giddiness: Secondary | ICD-10-CM | POA: Diagnosis not present

## 2021-06-02 ENCOUNTER — Other Ambulatory Visit: Payer: Self-pay | Admitting: *Deleted

## 2021-06-02 DIAGNOSIS — I10 Essential (primary) hypertension: Secondary | ICD-10-CM

## 2021-06-02 DIAGNOSIS — F339 Major depressive disorder, recurrent, unspecified: Secondary | ICD-10-CM

## 2021-06-02 DIAGNOSIS — J45909 Unspecified asthma, uncomplicated: Secondary | ICD-10-CM

## 2021-06-02 DIAGNOSIS — E119 Type 2 diabetes mellitus without complications: Secondary | ICD-10-CM

## 2021-06-02 MED ORDER — ESCITALOPRAM OXALATE 20 MG PO TABS
20.0000 mg | ORAL_TABLET | Freq: Every day | ORAL | 0 refills | Status: DC
Start: 2021-06-02 — End: 2021-07-28

## 2021-06-02 MED ORDER — MONTELUKAST SODIUM 10 MG PO TABS: 10.0000 mg | ORAL_TABLET | Freq: Every evening | ORAL | 0 refills | Status: DC

## 2021-06-02 MED ORDER — GABAPENTIN 300 MG PO CAPS: 300.0000 mg | ORAL_CAPSULE | Freq: Two times a day (BID) | ORAL | 0 refills | Status: DC

## 2021-06-02 MED ORDER — PRAMIPEXOLE DIHYDROCHLORIDE 0.25 MG PO TABS: 0.2500 mg | ORAL_TABLET | Freq: Every day | ORAL | 0 refills | Status: DC

## 2021-06-02 MED ORDER — FENOFIBRATE 145 MG PO TABS: 145.0000 mg | ORAL_TABLET | Freq: Every day | ORAL | 0 refills | Status: DC

## 2021-06-02 MED ORDER — PROAIR RESPICLICK 108 (90 BASE) MCG/ACT IN AEPB: 2.0000 | INHALATION_SPRAY | RESPIRATORY_TRACT | 1 refills | Status: DC | PRN

## 2021-06-02 MED ORDER — EMPAGLIFLOZIN 10 MG PO TABS: 10.0000 mg | ORAL_TABLET | Freq: Every day | ORAL | 0 refills | Status: DC

## 2021-06-02 MED ORDER — LISINOPRIL 10 MG PO TABS
10.0000 mg | ORAL_TABLET | Freq: Every day | ORAL | 0 refills | Status: DC
Start: 2021-06-02 — End: 2021-07-28

## 2021-06-02 NOTE — Addendum Note (Signed)
Addended by: Antonietta Barcelona D on: 06/02/2021 01:16 PM   Modules accepted: Orders

## 2021-07-01 ENCOUNTER — Encounter: Payer: BC Managed Care – PPO | Admitting: Nurse Practitioner

## 2021-07-11 ENCOUNTER — Ambulatory Visit (INDEPENDENT_AMBULATORY_CARE_PROVIDER_SITE_OTHER): Payer: BC Managed Care – PPO | Admitting: Nurse Practitioner

## 2021-07-11 ENCOUNTER — Encounter: Payer: Self-pay | Admitting: Nurse Practitioner

## 2021-07-11 ENCOUNTER — Other Ambulatory Visit: Payer: Self-pay

## 2021-07-11 VITALS — BP 153/80 | HR 80 | Temp 97.4°F | Ht 61.0 in | Wt 173.0 lb

## 2021-07-11 DIAGNOSIS — B360 Pityriasis versicolor: Secondary | ICD-10-CM

## 2021-07-11 DIAGNOSIS — Z0001 Encounter for general adult medical examination with abnormal findings: Secondary | ICD-10-CM

## 2021-07-11 DIAGNOSIS — Z0000 Encounter for general adult medical examination without abnormal findings: Secondary | ICD-10-CM | POA: Insufficient documentation

## 2021-07-11 MED ORDER — KETOCONAZOLE 2 % EX SHAM: 1.0000 | MEDICATED_SHAMPOO | CUTANEOUS | 0 refills | Status: DC

## 2021-07-11 MED ORDER — FLUCONAZOLE 150 MG PO TABS: 300.0000 mg | ORAL_TABLET | ORAL | 0 refills | Status: DC

## 2021-07-11 NOTE — Assessment & Plan Note (Signed)
Completed physical assessment, head to toe.  Provided education on health maintenance and preventative care, printed handouts given. Follow-up in 1 year

## 2021-07-11 NOTE — Assessment & Plan Note (Addendum)
Chronic tinea versicolor, continue to treat with Diflucan and  Nizoral  Rx sent to pharmacy.

## 2021-07-11 NOTE — Patient Instructions (Signed)
Health Maintenance, Female Adopting a healthy lifestyle and getting preventive care are important in promoting health and wellness. Ask your health care provider about: The right schedule for you to have regular tests and exams. Things you can do on your own to prevent diseases and keep yourself healthy. What should I know about diet, weight, and exercise? Eat a healthy diet  Eat a diet that includes plenty of vegetables, fruits, low-fat dairy products, and lean protein. Do not eat a lot of foods that are high in solid fats, added sugars, or sodium. Maintain a healthy weight Body mass index (BMI) is used to identify weight problems. It estimates body fat based on height and weight. Your health care provider can help determine your BMI and help you achieve or maintain a healthy weight. Get regular exercise Get regular exercise. This is one of the most important things you can do for your health. Most adults should: Exercise for at least 150 minutes each week. The exercise should increase your heart rate and make you sweat (moderate-intensity exercise). Do strengthening exercises at least twice a week. This is in addition to the moderate-intensity exercise. Spend less time sitting. Even light physical activity can be beneficial. Watch cholesterol and blood lipids Have your blood tested for lipids and cholesterol at 64 years of age, then have this test every 5 years. Have your cholesterol levels checked more often if: Your lipid or cholesterol levels are high. You are older than 64 years of age. You are at high risk for heart disease. What should I know about cancer screening? Depending on your health history and family history, you may need to have cancer screening at various ages. This may include screening for: Breast cancer. Cervical cancer. Colorectal cancer. Skin cancer. Lung cancer. What should I know about heart disease, diabetes, and high blood pressure? Blood pressure and heart  disease High blood pressure causes heart disease and increases the risk of stroke. This is more likely to develop in people who have high blood pressure readings, are of African descent, or are overweight. Have your blood pressure checked: Every 3-5 years if you are 18-39 years of age. Every year if you are 40 years old or older. Diabetes Have regular diabetes screenings. This checks your fasting blood sugar level. Have the screening done: Once every three years after age 40 if you are at a normal weight and have a low risk for diabetes. More often and at a younger age if you are overweight or have a high risk for diabetes. What should I know about preventing infection? Hepatitis B If you have a higher risk for hepatitis B, you should be screened for this virus. Talk with your health care provider to find out if you are at risk for hepatitis B infection. Hepatitis C Testing is recommended for: Everyone born from 1945 through 1965. Anyone with known risk factors for hepatitis C. Sexually transmitted infections (STIs) Get screened for STIs, including gonorrhea and chlamydia, if: You are sexually active and are younger than 64 years of age. You are older than 64 years of age and your health care provider tells you that you are at risk for this type of infection. Your sexual activity has changed since you were last screened, and you are at increased risk for chlamydia or gonorrhea. Ask your health care provider if you are at risk. Ask your health care provider about whether you are at high risk for HIV. Your health care provider may recommend a prescription medicine   to help prevent HIV infection. If you choose to take medicine to prevent HIV, you should first get tested for HIV. You should then be tested every 3 months for as long as you are taking the medicine. Pregnancy If you are about to stop having your period (premenopausal) and you may become pregnant, seek counseling before you get  pregnant. Take 400 to 800 micrograms (mcg) of folic acid every day if you become pregnant. Ask for birth control (contraception) if you want to prevent pregnancy. Osteoporosis and menopause Osteoporosis is a disease in which the bones lose minerals and strength with aging. This can result in bone fractures. If you are 65 years old or older, or if you are at risk for osteoporosis and fractures, ask your health care provider if you should: Be screened for bone loss. Take a calcium or vitamin D supplement to lower your risk of fractures. Be given hormone replacement therapy (HRT) to treat symptoms of menopause. Follow these instructions at home: Lifestyle Do not use any products that contain nicotine or tobacco, such as cigarettes, e-cigarettes, and chewing tobacco. If you need help quitting, ask your health care provider. Do not use street drugs. Do not share needles. Ask your health care provider for help if you need support or information about quitting drugs. Alcohol use Do not drink alcohol if: Your health care provider tells you not to drink. You are pregnant, may be pregnant, or are planning to become pregnant. If you drink alcohol: Limit how much you use to 0-1 drink a day. Limit intake if you are breastfeeding. Be aware of how much alcohol is in your drink. In the [place] one drink equals one 12 oz bottle of beer (355 mL), one 5 oz glass of wine (148 mL), or one 1 oz glass of hard liquor (44 mL). General instructions Schedule regular health, dental, and eye exams. Stay current with your vaccines. Tell your health care provider if: You often feel depressed. You have ever been abused or do not feel safe at home. Summary Adopting a healthy lifestyle and getting preventive care are important in promoting health and wellness. Follow your health care provider's instructions about healthy diet, exercising, and getting tested or screened for diseases. Follow your health care provider's  instructions on monitoring your cholesterol and blood pressure. This information is not intended to replace advice given to you by your health care provider. Make sure you discuss any questions you have with your health care provider. Document Revised: 12/03/2020 Document Reviewed: 09/18/2018 Elsevier Patient Education  2022 [employer]  

## 2021-07-11 NOTE — Progress Notes (Signed)
Established Patient Office Visit  Subjective:  Patient ID: Teresa Soto, female    DOB: [birth year]  Age: 65 y.o. MRN: [id number]  CC:  Chief Complaint  Patient presents with   Annual Exam    HPI Teresa Soto presents for .   Encounter for general adult medical examination Physical: Patient's last physical exam was 1 year ago .  Weight: Appropriate for height (BMI greater than 27%) ;  Blood Pressure: Normal (BP less than 120/80) ;  Medical History: Patient history reviewed ; Family history reviewed ;  Allergies Reviewed: No change in current allergies ;  Medications Reviewed: Medications reviewed - no changes ;  Lipids: Normal lipid levels ;  Smoking: Life-long non-smoker ; no Physical Activity: Exercises at least 3 times per week ; No Alcohol/Drug Use: Is a -drinker ; No illicit drug use ; no  Patient is not afflicted from Stress Incontinence and Urge Incontinence  Safety: reviewed ; Patient wears a seat belt, has smoke detectors, has carbon monoxide detectors, practices appropriate gun safety, and wears sunscreen with extended sun exposure. Dental Care: biannual cleaning: No brushes and flosses daily.: some days Ophthalmology/Optometry: Annual visit. Yes Hearing loss: yes , wear hearing aids right ear Vision impairments: prescription  Past Medical History:  Diagnosis Date   Arthritis    Arthritis of knee, right 08/12/2014   Asthma    Asthma    Phreesia 06/16/2020   Chronic back pain    Chronic constipation    Depression    Diabetes mellitus without complication ([place])    Phreesia 06/16/2020   Heart murmur    in childhood   History of bronchitis    History of urinary tract infection    Hyperlipidemia    Phreesia 06/16/2020   Hypertension    Imbalance    Meniere disease    Neuropathic pain    Osteoporosis    Phreesia 06/16/2020   Parathyroid adenoma    Poor fine motor skills    secondary to CVA per right side    Pre-diabetes    PTSD (post-traumatic  stress disorder)    Restless legs syndrome 2007 approx   Seasonal allergies    Shortness of breath dyspnea    coldness    Stroke [facility])    Vertigo     Past Surgical History:  Procedure Laterality Date   BACK SURGERY     CESAREAN SECTION     x 2   CESAREAN SECTION N/A    Phreesia 06/16/2020   JOINT REPLACEMENT N/A    Phreesia 06/16/2020   PARATHYROIDECTOMY N/A 06/27/2016   Procedure: MINIMALLY INVASIVE PARATHYROIDECTOMY;  Surgeon: Greer Pickerel, MD;  Location: [facility];  Service: General;  Laterality: N/A;   SHOULDER ARTHROSCOPY W/ ROTATOR CUFF REPAIR     to right shoulder   SPINE SURGERY  12/2010   ruptd L1 L2 , Dr Carloyn Manner   TONSILLECTOMY     TOTAL KNEE ARTHROPLASTY Right 08/12/2014   Procedure: RIGHT TOTAL KNEE ARTHROPLASTY;  Surgeon: Carole Civil, MD;  Location: [facility];  Service: Orthopedics;  Laterality: Right;   TUBAL LIGATION      Family History  Problem Relation Age of Onset   Dementia Paternal Grandmother     Social History   Socioeconomic History   Marital status: Married    Spouse name: Not on file   Number of children: Not on file   Years of education: Not on file   Highest education level: Not on file  Occupational  History   Not on file  Tobacco Use   Smoking status: Former    Packs/day: 2.00    Years: 6.00    Pack years: 12.00    Types: Cigarettes    Quit date: 10/09/1982    Years since quitting: 38.7   Smokeless tobacco: Never   Tobacco comments:    quit in 1984  Substance and Sexual Activity   Alcohol use: Yes    Comment: occasionally   Drug use: No   Sexual activity: Yes    Birth control/protection: Surgical  Other Topics Concern   Not on file  Social History Narrative   Not on file   Social Determinants of Health   Financial Resource Strain: Not on file  Food Insecurity: Not on file  Transportation Needs: Not on file  Physical Activity: Not on file  Stress: Not on file  Social Connections: Not on file  Intimate Partner Violence: Not  on file    Outpatient Medications Prior to Visit  Medication Sig Dispense Refill   Albuterol Sulfate (PROAIR RESPICLICK) 956 (90 Base) MCG/ACT AEPB Inhale 2 puffs into the lungs every 4 (four) hours as needed. For shortness of breath. 3 each 1   aspirin EC 81 MG tablet Take 81 mg by mouth daily. Swallow whole.     BIOTIN PO Take 1 tablet by mouth every evening.      Blood Glucose Monitoring Suppl (BLOOD GLUCOSE SYSTEM PAK) KIT Please dispense based on patient and insurance preference. Use as directed to monitor FSBS 1x daily. Dx: E11.9. 1 each 1   empagliflozin (JARDIANCE) 10 MG TABS tablet Take 1 tablet (10 mg total) by mouth daily. 90 tablet 0   escitalopram (LEXAPRO) 20 MG tablet Take 1 tablet (20 mg total) by mouth daily. 90 tablet 0   fenofibrate (TRICOR) 145 MG tablet Take 1 tablet (145 mg total) by mouth daily. 90 tablet 0   gabapentin (NEURONTIN) 300 MG capsule Take 1 capsule (300 mg total) by mouth 2 (two) times daily. 180 capsule 0   Lancet Devices MISC Please dispense based on patient and insurance preference. Use as directed to monitor FSBS 1x daily. Dx: E11.9. 1 each 1   Lancets MISC Please dispense based on patient and insurance preference. Use as directed to monitor FSBS 1x daily. Dx: E11.9. 100 each 1   lisinopril (ZESTRIL) 10 MG tablet Take 1 tablet (10 mg total) by mouth daily. 90 tablet 0   montelukast (SINGULAIR) 10 MG tablet Take 1 tablet (10 mg total) by mouth every evening. 90 tablet 0   polyethylene glycol (MIRALAX / GLYCOLAX) packet Take 17 g by mouth daily. (Patient taking differently: Take 17 g by mouth every other day.) 14 each 0   pramipexole (MIRAPEX) 0.25 MG tablet Take 1 tablet (0.25 mg total) by mouth daily with breakfast. 90 tablet 0   No facility-administered medications prior to visit.    Allergies  Allergen Reactions   Other Nausea And Vomiting    Peppers   Pneumococcal Vaccines     Had rash, given both flu and PNA at same time    Statins     Influenza Vaccines Rash    ROS Review of Systems  Constitutional: Negative.   HENT: Negative.    Respiratory: Negative.    Cardiovascular: Negative.   Gastrointestinal: Negative.   Musculoskeletal:  Positive for arthralgias.  Skin:  Positive for rash.  Neurological: Negative.   All other systems reviewed and are negative.    Objective:  Physical Exam Vitals and nursing note reviewed.  Constitutional:      Appearance: Normal appearance.  HENT:     Head: Normocephalic.     Right Ear: Ear canal and external ear normal. There is no impacted cerumen.     Left Ear: Ear canal and external ear normal. There is no impacted cerumen.     Nose: Nose normal.     Mouth/Throat:     Mouth: Mucous membranes are moist.     Pharynx: Oropharynx is clear.  Eyes:     Conjunctiva/sclera: Conjunctivae normal.  Cardiovascular:     Rate and Rhythm: Normal rate and regular rhythm.  Pulmonary:     Effort: Pulmonary effort is normal.     Breath sounds: Normal breath sounds.  Abdominal:     General: Bowel sounds are normal.  Skin:    General: Skin is warm.     Findings: Rash present.          Comments: Tinea versicolor  Neurological:     Mental Status: She is alert and oriented to person, place, and time.  Psychiatric:        Mood and Affect: Mood normal.        Behavior: Behavior normal.    BP (!) 153/80   Pulse 80   Temp (!) 97.4 F (36.3 C) (Temporal)   Ht 5' 1"  (1.549 m)   Wt 173 lb (78.5 kg)   SpO2 97%   BMI 32.69 kg/m  Wt Readings from Last 3 Encounters:  07/11/21 173 lb (78.5 kg)  05/13/21 179 lb (81.2 kg)  07/05/20 176 lb (79.8 kg)     Health Maintenance Due  Topic Date Due   COLONOSCOPY (Pts 45-28yr Insurance coverage will need to be confirmed)  Never done   Zoster Vaccines- Shingrix (1 of 2) Never done   OPHTHALMOLOGY EXAM  07/10/2018   MAMMOGRAM  02/26/2020   COVID-19 Vaccine (3 - Booster for Pfizer series) 09/08/2020   FOOT EXAM  06/17/2021    There are  no preventive care reminders to display for this patient.  Lab Results  Component Value Date   TSH 0.67 06/17/2020   Lab Results  Component Value Date   WBC 5.5 05/13/2021   HGB 13.4 05/13/2021   HCT 40.4 05/13/2021   MCV 85 05/13/2021   PLT 320 05/13/2021   Lab Results  Component Value Date   NA 138 05/13/2021   K 4.5 05/13/2021   CO2 22 05/13/2021   GLUCOSE 163 (H) 05/13/2021   BUN 20 05/13/2021   CREATININE 1.19 (H) 05/13/2021   BILITOT 0.2 05/13/2021   ALKPHOS 48 05/13/2021   AST 18 05/13/2021   ALT 19 05/13/2021   PROT 6.6 05/13/2021   ALBUMIN 4.1 05/13/2021   CALCIUM 10.5 (H) 05/13/2021   ANIONGAP 11 01/02/2018   EGFR 51 (L) 05/13/2021   Lab Results  Component Value Date   CHOL 174 05/13/2021   Lab Results  Component Value Date   HDL 41 05/13/2021   Lab Results  Component Value Date   LDLCALC 99 05/13/2021   Lab Results  Component Value Date   TRIG 198 (H) 05/13/2021   Lab Results  Component Value Date   CHOLHDL 4.2 05/13/2021   Lab Results  Component Value Date   HGBA1C 7.3 (H) 05/13/2021      Assessment & Plan:   Problem List Items Addressed This Visit       Musculoskeletal and Integument   Tinea versicolor  Chronic tinea versicolor, continue to treat with Diflucan and  Nizoral  Rx sent to pharmacy.      Relevant Medications   fluconazole (DIFLUCAN) 150 MG tablet   ketoconazole (NIZORAL) 2 % shampoo     Other   Encounter for annual physical exam - Primary    Completed physical assessment, head to toe.  Provided education on health maintenance and preventative care, printed handouts given. Follow-up in 1 year        Meds ordered this encounter  Medications   fluconazole (DIFLUCAN) 150 MG tablet    Sig: Take 2 tablets (300 mg total) by mouth every 7 (seven) days.    Dispense:  4 tablet    Refill:  0    Order Specific Question:   Supervising Provider    Answer:   Janora Norlander [[id number]]   ketoconazole (NIZORAL) 2  % shampoo    Sig: Apply 1 application topically 2 (two) times a week.    Dispense:  120 mL    Refill:  0    Order Specific Question:   Supervising Provider    Answer:   Janora Norlander [[id number]]    Follow-up: Return in about 1 year (around 07/11/2022).    Ivy Lynn, NP

## 2021-07-13 ENCOUNTER — Other Ambulatory Visit: Payer: Self-pay | Admitting: Nurse Practitioner

## 2021-07-13 ENCOUNTER — Telehealth: Payer: Self-pay | Admitting: *Deleted

## 2021-07-13 DIAGNOSIS — J45909 Unspecified asthma, uncomplicated: Secondary | ICD-10-CM

## 2021-07-13 NOTE — Telephone Encounter (Signed)
Fax from [employer] RE: Lluveras Not covered by ins, can this be changed to regular albuterol MDI

## 2021-07-14 ENCOUNTER — Other Ambulatory Visit: Payer: Self-pay | Admitting: Nurse Practitioner

## 2021-07-14 DIAGNOSIS — J45909 Unspecified asthma, uncomplicated: Secondary | ICD-10-CM

## 2021-07-14 MED ORDER — ALBUTEROL SULFATE HFA 108 (90 BASE) MCG/ACT IN AERS: 2.0000 | INHALATION_SPRAY | Freq: Four times a day (QID) | RESPIRATORY_TRACT | 3 refills | Status: DC | PRN

## 2021-07-15 ENCOUNTER — Telehealth: Payer: Self-pay | Admitting: Nurse Practitioner

## 2021-07-17 ENCOUNTER — Other Ambulatory Visit: Payer: Self-pay | Admitting: Nurse Practitioner

## 2021-07-17 DIAGNOSIS — E119 Type 2 diabetes mellitus without complications: Secondary | ICD-10-CM

## 2021-07-18 NOTE — Telephone Encounter (Signed)
Pt aware by phone 

## 2021-07-19 ENCOUNTER — Other Ambulatory Visit: Payer: BC Managed Care – PPO

## 2021-07-19 DIAGNOSIS — E119 Type 2 diabetes mellitus without complications: Secondary | ICD-10-CM | POA: Diagnosis not present

## 2021-07-19 NOTE — Telephone Encounter (Signed)
Taken care of in an orders only encounter

## 2021-07-20 LAB — MICROALBUMIN, URINE: Microalbumin, Urine: 10 ug/mL

## 2021-07-25 DIAGNOSIS — M5416 Radiculopathy, lumbar region: Secondary | ICD-10-CM | POA: Diagnosis not present

## 2021-07-28 ENCOUNTER — Other Ambulatory Visit: Payer: Self-pay | Admitting: *Deleted

## 2021-07-28 DIAGNOSIS — I10 Essential (primary) hypertension: Secondary | ICD-10-CM

## 2021-07-28 DIAGNOSIS — F339 Major depressive disorder, recurrent, unspecified: Secondary | ICD-10-CM

## 2021-07-28 DIAGNOSIS — E119 Type 2 diabetes mellitus without complications: Secondary | ICD-10-CM

## 2021-07-28 MED ORDER — EMPAGLIFLOZIN 10 MG PO TABS: 10.0000 mg | ORAL_TABLET | Freq: Every day | ORAL | 0 refills | Status: DC

## 2021-07-28 MED ORDER — ESCITALOPRAM OXALATE 20 MG PO TABS
20.0000 mg | ORAL_TABLET | Freq: Every day | ORAL | 0 refills | Status: DC
Start: 2021-07-28 — End: 2021-09-21

## 2021-07-28 MED ORDER — FENOFIBRATE 145 MG PO TABS
145.0000 mg | ORAL_TABLET | Freq: Every day | ORAL | 0 refills | Status: DC
Start: 2021-07-28 — End: 2021-09-21

## 2021-07-28 MED ORDER — LISINOPRIL 10 MG PO TABS: 10.0000 mg | ORAL_TABLET | Freq: Every day | ORAL | 0 refills | Status: DC

## 2021-08-16 ENCOUNTER — Other Ambulatory Visit: Payer: Self-pay

## 2021-08-16 ENCOUNTER — Ambulatory Visit: Payer: BC Managed Care – PPO | Admitting: Nurse Practitioner

## 2021-08-16 ENCOUNTER — Encounter: Payer: Self-pay | Admitting: Nurse Practitioner

## 2021-08-16 VITALS — BP 137/87 | HR 100 | Temp 97.6°F | Resp 20 | Ht 61.0 in | Wt 168.0 lb

## 2021-08-16 DIAGNOSIS — E119 Type 2 diabetes mellitus without complications: Secondary | ICD-10-CM

## 2021-08-16 DIAGNOSIS — E782 Mixed hyperlipidemia: Secondary | ICD-10-CM | POA: Diagnosis not present

## 2021-08-16 DIAGNOSIS — E210 Primary hyperparathyroidism: Secondary | ICD-10-CM

## 2021-08-16 DIAGNOSIS — I10 Essential (primary) hypertension: Secondary | ICD-10-CM | POA: Diagnosis not present

## 2021-08-16 LAB — BAYER DCA HB A1C WAIVED: HB A1C (BAYER DCA - WAIVED): 6.7 % — ABNORMAL HIGH (ref 4.8–5.6)

## 2021-08-16 NOTE — Assessment & Plan Note (Signed)
Hypertension controlled on current management no changes necessary.  Follow-up in 3 months.  Labs completed CBC, CMP, lipid panel, results pending.

## 2021-08-16 NOTE — Progress Notes (Signed)
Established Patient Office Visit  Subjective:  Patient ID: Teresa Soto, female    DOB: Dec 22, 1955  Age: 65 y.o. MRN: [id number]  CC:  Chief Complaint  Patient presents with   Medical Management of Chronic Issues    HPI Teresa Soto Pt presents for follow up of hypertension. Patient was diagnosed in 2012.  The patient is tolerating the medication well without side effects. Compliance with treatment has been good; including taking medication as directed , maintains a healthy diet and regular exercise regimen , and following up as directed.   Mixed hyperlipidemia  Pt presents with hyperlipidemia. Patient was diagnosed in .  11/18/2007 compliance with treatment has been good The patient is compliant with medications, maintains a low cholesterol diet , follows up as directed , and maintains an exercise regimen . The patient denies experiencing any hypercholesterolemia related symptoms.    The patient presents with history of type II diabetes mellitus without complications. Patient was diagnosed in 2018. Compliance with treatment has been good; the patient takes medication as directed , maintains a diabetic diet and an exercise regimen , follows up as directed , and is keeping a glucose diary. Sugars runs 120 and 140. Patient specifically denies associated symptoms, including blurred vision, fatigue, polydipsia, polyphagia and polyuria . Patient denies hypoglycemia. In regard to preventative care, the patient performs foot self-exams daily and last ophthalmology exam was in will schedule.   Past Medical History:  Diagnosis Date   Arthritis    Arthritis of knee, right 08/12/2014   Asthma    Asthma    Phreesia 06/16/2020   Chronic back pain    Chronic constipation    Depression    Diabetes mellitus without complication ([place])    Phreesia 06/16/2020   Heart murmur    in childhood   History of bronchitis    History of urinary tract infection    Hyperlipidemia    Phreesia 06/16/2020    Hypertension    Imbalance    Meniere disease    Neuropathic pain    Osteoporosis    Phreesia 06/16/2020   Parathyroid adenoma    Poor fine motor skills    secondary to CVA per right side    Pre-diabetes    PTSD (post-traumatic stress disorder)    Restless legs syndrome 2007 approx   Seasonal allergies    Shortness of breath dyspnea    coldness    Stroke [facility])    Vertigo     Past Surgical History:  Procedure Laterality Date   BACK SURGERY     CESAREAN SECTION     x 2   CESAREAN SECTION N/A    [facility]reesia 06/16/2020   JOINT REPLACEMENT N/A    Phreesia 06/16/2020   PARATHYROIDECTOMY N/A 06/27/2016   Procedure: MINIMALLY INVASIVE PARATHYROIDECTOMY;  Surgeon: Greer Pickerel, MD;  Location: [facility];  Service: General;  Laterality: N/A;   SHOULDER ARTHROSCOPY W/ ROTATOR CUFF REPAIR     to right shoulder   SPINE SURGERY  12/2010   ruptd L1 L2 , Dr Carloyn Manner   TONSILLECTOMY     TOTAL KNEE ARTHROPLASTY Right 08/12/2014   Procedure: RIGHT TOTAL KNEE ARTHROPLASTY;  Surgeon: Carole Civil, MD;  Location: [facility];  Service: Orthopedics;  Laterality: Right;   TUBAL LIGATION      Family History  Problem Relation Age of Onset   Dementia Paternal Grandmother     Social History   Socioeconomic History   Marital status: Married  Spouse name: Not on file   Number of children: Not on file   Years of education: Not on file   Highest education level: Not on file  Occupational History   Not on file  Tobacco Use   Smoking status: Former    Packs/day: 2.00    Years: 6.00    Pack years: 12.00    Types: Cigarettes    Quit date: 10/09/1982    Years since quitting: 38.8   Smokeless tobacco: Never   Tobacco comments:    quit in 1984  Substance and Sexual Activity   Alcohol use: Yes    Comment: occasionally   Drug use: No   Sexual activity: Yes    Birth control/protection: Surgical  Other Topics Concern   Not on file  Social History Narrative   Not on file   Social Determinants of  Health   Financial Resource Strain: Not on file  Food Insecurity: Not on file  Transportation Needs: Not on file  Physical Activity: Not on file  Stress: Not on file  Social Connections: Not on file  Intimate Partner Violence: Not on file    Outpatient Medications Prior to Visit  Medication Sig Dispense Refill   albuterol (VENTOLIN HFA) 108 (90 Base) MCG/ACT inhaler Inhale 2 puffs into the lungs every 6 (six) hours as needed for wheezing or shortness of breath. 8 g 3   aspirin EC 81 MG tablet Take 81 mg by mouth daily. Swallow whole.     BIOTIN PO Take 1 tablet by mouth every evening.      Blood Glucose Monitoring Suppl (BLOOD GLUCOSE SYSTEM PAK) KIT Please dispense based on patient and insurance preference. Use as directed to monitor FSBS 1x daily. Dx: E11.9. 1 each 1   empagliflozin (JARDIANCE) 10 MG TABS tablet Take 1 tablet (10 mg total) by mouth daily. 90 tablet 0   escitalopram (LEXAPRO) 20 MG tablet Take 1 tablet (20 mg total) by mouth daily. 90 tablet 0   fenofibrate (TRICOR) 145 MG tablet Take 1 tablet (145 mg total) by mouth daily. 90 tablet 0   gabapentin (NEURONTIN) 300 MG capsule Take 1 capsule (300 mg total) by mouth 2 (two) times daily. 180 capsule 0   ketoconazole (NIZORAL) 2 % shampoo Apply 1 application topically 2 (two) times a week. 120 mL 0   Lancet Devices MISC Please dispense based on patient and insurance preference. Use as directed to monitor FSBS 1x daily. Dx: E11.9. 1 each 1   Lancets MISC Please dispense based on patient and insurance preference. Use as directed to monitor FSBS 1x daily. Dx: E11.9. 100 each 1   lisinopril (ZESTRIL) 10 MG tablet Take 1 tablet (10 mg total) by mouth daily. 90 tablet 0   montelukast (SINGULAIR) 10 MG tablet Take 1 tablet (10 mg total) by mouth every evening. 90 tablet 0   naproxen (NAPROSYN) 500 MG tablet Take 500 mg by mouth 2 (two) times daily with a meal.     polyethylene glycol (MIRALAX / GLYCOLAX) packet Take 17 g by mouth  daily. (Patient taking differently: Take 17 g by mouth every other day.) 14 each 0   pramipexole (MIRAPEX) 0.25 MG tablet Take 1 tablet (0.25 mg total) by mouth daily with breakfast. 90 tablet 0   fluconazole (DIFLUCAN) 150 MG tablet Take 2 tablets (300 mg total) by mouth every 7 (seven) days. (Patient not taking: Reported on 08/16/2021) 4 tablet 0   No facility-administered medications prior to visit.  Allergies  Allergen Reactions   Other Nausea And Vomiting    Peppers   Pneumococcal Vaccines     Had rash, given both flu and PNA at same time    Statins    Influenza Vaccines Rash    ROS Review of Systems  Constitutional: Negative.   HENT: Negative.    Respiratory: Negative.    Cardiovascular: Negative.   Gastrointestinal: Negative.   Genitourinary: Negative.   Skin: Negative.  Negative for rash.  Hematological: Negative.   All other systems reviewed and are negative.    Objective:    Physical Exam Vitals and nursing note reviewed.  Constitutional:      Appearance: Normal appearance.  HENT:     Head: Normocephalic.     Right Ear: External ear normal.     Left Ear: External ear normal.     Nose: Nose normal.     Mouth/Throat:     Mouth: Mucous membranes are moist.     Pharynx: Oropharynx is clear.  Eyes:     Conjunctiva/sclera: Conjunctivae normal.  Cardiovascular:     Rate and Rhythm: Normal rate and regular rhythm.     Pulses: Normal pulses.     Heart sounds: Normal heart sounds.  Pulmonary:     Effort: Pulmonary effort is normal.     Breath sounds: Normal breath sounds.  Abdominal:     General: Bowel sounds are normal.  Musculoskeletal:        General: Normal range of motion.  Skin:    General: Skin is warm.  Neurological:     Mental Status: She is alert and oriented to person, place, and time.  Psychiatric:        Mood and Affect: Mood normal.        Behavior: Behavior normal.    BP 137/87   Pulse 100   Temp 97.6 F (36.4 C) (Temporal)   Resp  20   Ht $R'5\' 1"'Pw$  (1.549 m)   Wt 168 lb (76.2 kg)   SpO2 96%   BMI 31.74 kg/m  Wt Readings from Last 3 Encounters:  08/16/21 168 lb (76.2 kg)  07/11/21 173 lb (78.5 kg)  05/13/21 179 lb (81.2 kg)     Health Maintenance Due  Topic Date Due   Pneumococcal Vaccine 88-67 Years old (1 - PCV) Never done   COLONOSCOPY (Pts 45-44yrs Insurance coverage will need to be confirmed)  Never done   Zoster Vaccines- Shingrix (1 of 2) Never done   OPHTHALMOLOGY EXAM  07/10/2018   PAP SMEAR-Modifier  11/23/2019   MAMMOGRAM  02/26/2020   COVID-19 Vaccine (3 - Booster for Pfizer series) 06/03/2020   FOOT EXAM  06/17/2021    There are no preventive care reminders to display for this patient.  Lab Results  Component Value Date   TSH 0.67 06/17/2020   Lab Results  Component Value Date   WBC 5.5 05/13/2021   HGB 13.4 05/13/2021   HCT 40.4 05/13/2021   MCV 85 05/13/2021   PLT 320 05/13/2021   Lab Results  Component Value Date   NA 138 05/13/2021   K 4.5 05/13/2021   CO2 22 05/13/2021   GLUCOSE 163 (H) 05/13/2021   BUN 20 05/13/2021   CREATININE 1.19 (H) 05/13/2021   BILITOT 0.2 05/13/2021   ALKPHOS 48 05/13/2021   AST 18 05/13/2021   ALT 19 05/13/2021   PROT 6.6 05/13/2021   ALBUMIN 4.1 05/13/2021   CALCIUM 10.5 (H) 05/13/2021   ANIONGAP 11 01/02/2018  EGFR 51 (L) 05/13/2021   Lab Results  Component Value Date   CHOL 174 05/13/2021   Lab Results  Component Value Date   HDL 41 05/13/2021   Lab Results  Component Value Date   LDLCALC 99 05/13/2021   Lab Results  Component Value Date   TRIG 198 (H) 05/13/2021   Lab Results  Component Value Date   CHOLHDL 4.2 05/13/2021   Lab Results  Component Value Date   HGBA1C 6.7 (H) 08/16/2021      Assessment & Plan:   Problem List Items Addressed This Visit       Cardiovascular and Mediastinum   Essential hypertension    Hypertension controlled on current management no changes necessary.  Follow-up in 3 months.  Labs  completed CBC, CMP, lipid panel, results pending.        Endocrine   Hyperparathyroidism, primary ([place])    Symptoms well controlled no changes necessary.      Diabetes mellitus without complication ([place]) - Primary    Completed A1c, results pending.  Diabetes controlled on current medication no changes necessary.  Continue healthy diet, exercise as tolerated.      Relevant Orders   Bayer DCA Hb A1c Waived (Completed)   CBC with Differential   Comprehensive metabolic panel   Lipid Panel   CBC with Differential/Platelet   Comprehensive metabolic panel     Other   Mixed hyperlipidemia    Completed lipid panel results pending.      Relevant Orders   Lipid Panel    No orders of the defined types were placed in this encounter.   Follow-up: Return in about 3 months (around 11/16/2021).    Ivy Lynn, NP

## 2021-08-16 NOTE — Assessment & Plan Note (Signed)
Symptoms well controlled no changes necessary. 

## 2021-08-16 NOTE — Assessment & Plan Note (Signed)
Completed A1c, results pending.  Diabetes controlled on current medication no changes necessary.  Continue healthy diet, exercise as tolerated.

## 2021-08-16 NOTE — Patient Instructions (Signed)
High Cholesterol High cholesterol is a condition in which the blood has high levels of a white, waxy substance similar to fat (cholesterol). The liver makes all the cholesterol that the body needs. The human body needs small amounts of cholesterol to help build cells. A person gets extra or excess cholesterol from the food that he or she eats. The blood carries cholesterol from the liver to the rest of the body. If you have high cholesterol, deposits (plaques) may build up on the walls of your arteries. Arteries are the blood vessels that carry blood away from your heart. These plaques make the arteries narrow and stiff. Cholesterol plaques increase your risk for heart attack and stroke. Work with your health care provider to keep your cholesterol levels in a healthy range. What increases the risk? The following factors may make you more likely to develop this condition: Eating foods that are high in animal fat (saturated fat) or cholesterol. Being overweight. Not getting enough exercise. A family history of high cholesterol (familial hypercholesterolemia). Use of tobacco products. Having diabetes. What are the signs or symptoms? In most cases, high cholesterol does not usually cause any symptoms. In severe cases, very high cholesterol levels can cause: Fatty bumps under the skin (xanthomas). A white or gray ring around the black center (pupil) of the eye. How is this diagnosed? This condition may be diagnosed based on the results of a blood test. If you are older than 64 years of age, your health care provider may check your cholesterol levels every 4-6 years. You may be checked more often if you have high cholesterol or other risk factors for heart disease. The blood test for cholesterol measures: "Bad" cholesterol, or LDL cholesterol. This is the main type of cholesterol that causes heart disease. The desired level is less than 100 mg/dL (2.59 mmol/L). "Good" cholesterol, or HDL  cholesterol. HDL helps protect against heart disease by cleaning the arteries and carrying the LDL to the liver for processing. The desired level for HDL is 60 mg/dL (1.55 mmol/L) or higher. Triglycerides. These are fats that your body can store or burn for energy. The desired level is less than 150 mg/dL (1.69 mmol/L). Total cholesterol. This measures the total amount of cholesterol in your blood and includes LDL, HDL, and triglycerides. The desired level is less than 200 mg/dL (5.17 mmol/L). How is this treated? Treatment for high cholesterol starts with lifestyle changes, such as diet and exercise. Diet changes. You may be asked to eat foods that have more fiber and less saturated fats or added sugar. Lifestyle changes. These may include regular exercise, maintaining a healthy weight, and quitting use of tobacco products. Medicines. These are given when diet and lifestyle changes have not worked. You may be prescribed a statin medicine to help lower your cholesterol levels. Follow these instructions at home: Eating and drinking  Eat a healthy, balanced diet. This diet includes: Daily servings of a variety of fresh, frozen, or canned fruits and vegetables. Daily servings of whole grain foods that are rich in fiber. Foods that are low in saturated fats and trans fats. These include poultry and fish without skin, lean cuts of meat, and low-fat dairy products. A variety of fish, especially oily fish that contain omega-3 fatty acids. Aim to eat fish at least 2 times a week. Avoid foods and drinks that have added sugar. Use healthy cooking methods, such as roasting, grilling, broiling, baking, poaching, steaming, and stir-frying. Do not fry your food except for stir-frying.  If you drink alcohol: Limit how much you have to: 0-1 drink a day for women who are not pregnant. 0-2 drinks a day for men. Know how much alcohol is in a drink. In the [place] one drink equals one 12 oz bottle of beer (355 mL),  one 5 oz glass of wine (148 mL), or one 1 oz glass of hard liquor (44 mL). Lifestyle  Get regular exercise. Aim to exercise for a total of 150 minutes a week. Increase your activity level by doing activities such as gardening, walking, and taking the stairs. Do not use any products that contain nicotine or tobacco. These products include cigarettes, chewing tobacco, and vaping devices, such as e-cigarettes. If you need help quitting, ask your health care provider. General instructions Take over-the-counter and prescription medicines only as told by your health care provider. Keep all follow-up visits. This is important. Where to find more information American Heart Association: www.heart.org National Heart, Lung, and Blood Institute: https://wilson-eaton.com/ Contact a health care provider if: You have trouble achieving or maintaining a healthy diet or weight. You are starting an exercise program. You are unable to stop smoking. Get help right away if: You have chest pain. You have trouble breathing. You have discomfort or pain in your jaw, neck, back, shoulder, or arm. You have any symptoms of a stroke. "BE FAST" is an easy way to remember the main warning signs of a stroke: B - Balance. Signs are dizziness, sudden trouble walking, or loss of balance. E - Eyes. Signs are trouble seeing or a sudden change in vision. F - Face. Signs are sudden weakness or numbness of the face, or the face or eyelid drooping on one side. A - Arms. Signs are weakness or numbness in an arm. This happens suddenly and usually on one side of the body. S - Speech. Signs are sudden trouble speaking, slurred speech, or trouble understanding what people say. T - Time. Time to call emergency services. Write down what time symptoms started. You have other signs of a stroke, such as: A sudden, severe headache with no known cause. Nausea or vomiting. Seizure. These symptoms may represent a serious problem that is an  emergency. Do not wait to see if the symptoms will go away. Get medical help right away. Call your local emergency services (911 in the [place] Do not drive yourself to the hospital. Summary Cholesterol plaques increase your risk for heart attack and stroke. Work with your health care provider to keep your cholesterol levels in a healthy range. Eat a healthy, balanced diet, get regular exercise, and maintain a healthy weight. Do not use any products that contain nicotine or tobacco. These products include cigarettes, chewing tobacco, and vaping devices, such as e-cigarettes. Get help right away if you have any symptoms of a stroke. This information is not intended to replace advice given to you by your health care provider. Make sure you discuss any questions you have with your health care provider. Document Revised: 12/09/2020 Document Reviewed: 11/29/2020 Elsevier Patient Education  2022 [place]. Major Depressive Disorder, Adult Major depressive disorder (MDD) is a mental health condition. It may also be called clinical depression or unipolar depression. MDD causes symptoms of sadness, hopelessness, and loss of interest in things. These symptoms last most of the day, almost every day, for 2 weeks. MDD can also cause physical symptoms. It can interfere with relationships and with everyday activities, such as work, school, and activities that are usually pleasant. MDD may be  mild, moderate, or severe. It may be single-episode MDD, which happens once, or recurrent MDD, which may occur multiple times. What are the causes? The exact cause of this condition is not known. MDD is most likely caused by a combination of things, which may include: Your personality traits. Learned or conditioned behaviors or thoughts or feelings that reinforce negativity. Any alcohol or substance misuse. Long-term (chronic) physical or mental health illness. Going through a traumatic experience or major life  changes. What increases the risk? The following factors may make someone more likely to develop MDD: A family history of depression. Being a woman. Troubled family relationships. Abnormally low levels of certain brain chemicals. Traumatic or painful events in childhood, especially abuse or loss of a parent. A lot of stress from life experiences, such as poor living conditions or discrimination. Chronic physical illness or other mental health disorders. What are the signs or symptoms? The main symptoms of MDD usually include: Constant depressed or irritable mood. A loss of interest in things and activities. Other symptoms include: Sleeping or eating too much or too little. Unexplained weight gain or weight loss. Tiredness or low energy. Being agitated, restless, or weak. Feeling hopeless, worthless, or guilty. Trouble thinking clearly or making decisions. Thoughts of suicide or thoughts of harming others. Isolating oneself or avoiding other people or activities. Trouble completing tasks, work, or any normal obligations. Severe symptoms of this condition may include: Psychotic depression.This may include false beliefs, or delusions. It may also include seeing, hearing, tasting, smelling, or feeling things that are not real (hallucinations). Chronic depression or persistent depressive disorder. This is low-level depression that lasts for at least 2 years. Melancholic depression, or feeling extremely sad and hopeless. Catatonic depression, which includes trouble speaking and trouble moving. How is this diagnosed? This condition may be diagnosed based on: Your symptoms. Your medical and mental health history. You may be asked questions about your lifestyle, including any drug and alcohol use. A physical exam. Blood tests to rule out other conditions. MDD is confirmed if you have the following symptoms most of the day, nearly every day, in a 2-week period: Either a depressed mood or  loss of interest. At least four other MDD symptoms. How is this treated? This condition is usually treated by mental health professionals, such as psychologists, psychiatrists, and clinical social workers. You may need more than one type of treatment. Treatment may include: Psychotherapy, also called talk therapy or counseling. Types of psychotherapy include: Cognitive behavioral therapy (CBT). This teaches you to recognize unhealthy feelings, thoughts, and behaviors, and replace them with positive thoughts and actions. Interpersonal therapy (IPT). This helps you to improve the way you communicate with others or relate to them. Family therapy. This treatment includes members of your family. Medicines to treat anxiety and depression. These medicines help to balance the brain chemicals that affect your emotions. Lifestyle changes. You may be asked to: Limit alcohol use and avoid drug use. Get regular exercise. Get plenty of sleep. Make healthy eating choices. Spend more time outdoors. Brain stimulation. This may be done if symptoms are very severe and other treatments have not worked. Examples of this treatment are electroconvulsive therapy and transcranial magnetic stimulation. Follow these instructions at home: Activity Exercise regularly and spend time outdoors. Find activities that you enjoy doing, and make time to do them. Find healthy ways to manage stress, such as: Meditation or deep breathing. Spending time in nature. Journaling. Return to your normal activities as told by your health care  provider. Ask your health care provider what activities are safe for you. Alcohol and drug use If you drink alcohol: Limit how much you use to: 0-1 drink a day for women who are not pregnant. 0-2 drinks a day for men. Be aware of how much alcohol is in your drink. In the [place] one drink equals one 12 oz bottle of beer (355 mL), one 5 oz glass of wine (148 mL), or one 1 oz glass of hard liquor  (44 mL). Discuss your alcohol use with your health care provider. Alcohol can affect any antidepressant medicines you are taking. Discuss any drug use with your health care provider. General instructions  Take over-the-counter and prescription medicines only as told by your health care provider. Eat a healthy diet and get plenty of sleep. Consider joining a support group. Your health care provider may be able to recommend one. Keep all follow-up visits as told by your health care provider. This is important. Where to find more information [employer] on Mental Illness: www.nami.[place]: https://carter.com/ Contact a health care provider if: Your symptoms get worse. You develop new symptoms. Get help right away if: You self-harm. You have serious thoughts about hurting yourself or others. You hallucinate. If you ever feel like you may hurt yourself or others, or have thoughts about taking your own life, get help right away. Go to your nearest emergency department or: Call your local emergency services (911 in the [place] Call a suicide crisis helpline, such as the [facility] at 510-059-5459 or 988 in the [place]. This is open 24 hours a day in the [place] Text the Crisis Text Line at 424-846-2878 (in the [place].). Summary Major depressive disorder (MDD) is a mental health condition. MDD causes symptoms of sadness, hopelessness, and loss of interest in things. These symptoms last most of the day, almost every day, for 2 weeks. The symptoms of MDD can interfere with relationships and with everyday activities. Treatments and support are available for people who develop MDD. You may need more than one type of treatment. Get help right away if you have serious thoughts about hurting yourself or others. This information is not intended to replace advice given to you by your health care provider. Make sure you discuss any questions you have with  your health care provider. Document Revised: 04/20/2021 Document Reviewed: 09/06/2019 Elsevier Patient Education  2022 [place]. Hypertension, Adult Hypertension is another name for high blood pressure. High blood pressure forces your heart to work harder to pump blood. This can cause problems over time. There are two numbers in a blood pressure reading. There is a top number (systolic) over a bottom number (diastolic). It is best to have a blood pressure that is below 120/80. Healthy choices can help lower your blood pressure, or you may need medicine to help lower it. What are the causes? The cause of this condition is not known. Some conditions may be related to high blood pressure. What increases the risk? Smoking. Having type 2 diabetes mellitus, high cholesterol, or both. Not getting enough exercise or physical activity. Being overweight. Having too much fat, sugar, calories, or salt (sodium) in your diet. Drinking too much alcohol. Having long-term (chronic) kidney disease. Having a family history of high blood pressure. Age. Risk increases with age. Race. You may be at higher risk if you are African American. Gender. Men are at higher risk than women before age 78. After age 13, women are  at higher risk than men. Having obstructive sleep apnea. Stress. What are the signs or symptoms? High blood pressure may not cause symptoms. Very high blood pressure (hypertensive crisis) may cause: Headache. Feelings of worry or nervousness (anxiety). Shortness of breath. Nosebleed. A feeling of being sick to your stomach (nausea). Throwing up (vomiting). Changes in how you see. Very bad chest pain. Seizures. How is this treated? This condition is treated by making healthy lifestyle changes, such as: Eating healthy foods. Exercising more. Drinking less alcohol. Your health care provider may prescribe medicine if lifestyle changes are not enough to get your blood pressure under  control, and if: Your top number is above 130. Your bottom number is above 80. Your personal target blood pressure may vary. Follow these instructions at home: Eating and drinking  If told, follow the DASH eating plan. To follow this plan: Fill one half of your plate at each meal with fruits and vegetables. Fill one fourth of your plate at each meal with whole grains. Whole grains include whole-wheat pasta, brown rice, and whole-grain bread. Eat or drink low-fat dairy products, such as skim milk or low-fat yogurt. Fill one fourth of your plate at each meal with low-fat (lean) proteins. Low-fat proteins include fish, chicken without skin, eggs, beans, and tofu. Avoid fatty meat, cured and processed meat, or chicken with skin. Avoid pre-made or processed food. Eat less than 1,500 mg of salt each day. Do not drink alcohol if: Your doctor tells you not to drink. You are pregnant, may be pregnant, or are planning to become pregnant. If you drink alcohol: Limit how much you use to: 0-1 drink a day for women. 0-2 drinks a day for men. Be aware of how much alcohol is in your drink. In the [place] one drink equals one 12 oz bottle of beer (355 mL), one 5 oz glass of wine (148 mL), or one 1 oz glass of hard liquor (44 mL). Lifestyle  Work with your doctor to stay at a healthy weight or to lose weight. Ask your doctor what the best weight is for you. Get at least 30 minutes of exercise most days of the week. This may include walking, swimming, or biking. Get at least 30 minutes of exercise that strengthens your muscles (resistance exercise) at least 3 days a week. This may include lifting weights or doing Pilates. Do not use any products that contain nicotine or tobacco, such as cigarettes, e-cigarettes, and chewing tobacco. If you need help quitting, ask your doctor. Check your blood pressure at home as told by your doctor. Keep all follow-up visits as told by your doctor. This is  important. Medicines Take over-the-counter and prescription medicines only as told by your doctor. Follow directions carefully. Do not skip doses of blood pressure medicine. The medicine does not work as well if you skip doses. Skipping doses also puts you at risk for problems. Ask your doctor about side effects or reactions to medicines that you should watch for. Contact a doctor if you: Think you are having a reaction to the medicine you are taking. Have headaches that keep coming back (recurring). Feel dizzy. Have swelling in your ankles. Have trouble with your vision. Get help right away if you: Get a very bad headache. Start to feel mixed up (confused). Feel weak or numb. Feel faint. Have very bad pain in your: Chest. Belly (abdomen). Throw up more than once. Have trouble breathing. Summary Hypertension is another name for high blood pressure. High blood  pressure forces your heart to work harder to pump blood. For most people, a normal blood pressure is less than 120/80. Making healthy choices can help lower blood pressure. If your blood pressure does not get lower with healthy choices, you may need to take medicine. This information is not intended to replace advice given to you by your health care provider. Make sure you discuss any questions you have with your health care provider. Document Revised: 06/05/2018 Document Reviewed: 06/05/2018 Elsevier Patient Education  [place].

## 2021-08-16 NOTE — Assessment & Plan Note (Signed)
Completed lipid panel results pending.

## 2021-08-17 ENCOUNTER — Telehealth: Payer: Self-pay

## 2021-08-17 ENCOUNTER — Other Ambulatory Visit: Payer: Self-pay | Admitting: Nurse Practitioner

## 2021-08-17 DIAGNOSIS — N281 Cyst of kidney, acquired: Secondary | ICD-10-CM

## 2021-08-17 NOTE — Telephone Encounter (Signed)
Results are available under the imaging tab

## 2021-08-17 NOTE — Telephone Encounter (Signed)
Patient forgot to mention something during her visit yesterday.  She had an MRI of her lumbar spine in December.  During her visit with her spinal doc to discuss the MRI results he mentioned that he noticed cysts on her kidneys.  She is concerned about this and would like to know what you recommend she do if anything.

## 2021-08-18 NOTE — Telephone Encounter (Signed)
Patient aware.

## 2021-08-24 ENCOUNTER — Other Ambulatory Visit: Payer: Self-pay | Admitting: *Deleted

## 2021-08-24 MED ORDER — MONTELUKAST SODIUM 10 MG PO TABS: 10.0000 mg | ORAL_TABLET | Freq: Every evening | ORAL | 0 refills | Status: DC

## 2021-08-24 MED ORDER — PRAMIPEXOLE DIHYDROCHLORIDE 0.25 MG PO TABS: 0.2500 mg | ORAL_TABLET | Freq: Every day | ORAL | 0 refills | Status: DC

## 2021-09-21 ENCOUNTER — Other Ambulatory Visit: Payer: Self-pay

## 2021-09-21 DIAGNOSIS — F339 Major depressive disorder, recurrent, unspecified: Secondary | ICD-10-CM

## 2021-09-21 DIAGNOSIS — I10 Essential (primary) hypertension: Secondary | ICD-10-CM

## 2021-09-21 MED ORDER — FENOFIBRATE 145 MG PO TABS: 145.0000 mg | ORAL_TABLET | Freq: Every day | ORAL | 1 refills | Status: DC

## 2021-09-21 MED ORDER — ESCITALOPRAM OXALATE 20 MG PO TABS: 20.0000 mg | ORAL_TABLET | Freq: Every day | ORAL | 1 refills | Status: DC

## 2021-09-22 DIAGNOSIS — E1129 Type 2 diabetes mellitus with other diabetic kidney complication: Secondary | ICD-10-CM | POA: Diagnosis not present

## 2021-09-22 DIAGNOSIS — I129 Hypertensive chronic kidney disease with stage 1 through stage 4 chronic kidney disease, or unspecified chronic kidney disease: Secondary | ICD-10-CM | POA: Diagnosis not present

## 2021-09-22 DIAGNOSIS — E559 Vitamin D deficiency, unspecified: Secondary | ICD-10-CM | POA: Diagnosis not present

## 2021-09-22 DIAGNOSIS — N189 Chronic kidney disease, unspecified: Secondary | ICD-10-CM | POA: Diagnosis not present

## 2021-09-28 DIAGNOSIS — R801 Persistent proteinuria, unspecified: Secondary | ICD-10-CM | POA: Diagnosis not present

## 2021-09-28 DIAGNOSIS — E559 Vitamin D deficiency, unspecified: Secondary | ICD-10-CM | POA: Diagnosis not present

## 2021-09-28 DIAGNOSIS — Z79899 Other long term (current) drug therapy: Secondary | ICD-10-CM | POA: Diagnosis not present

## 2021-09-28 DIAGNOSIS — E1129 Type 2 diabetes mellitus with other diabetic kidney complication: Secondary | ICD-10-CM | POA: Diagnosis not present

## 2021-09-28 DIAGNOSIS — Z1159 Encounter for screening for other viral diseases: Secondary | ICD-10-CM | POA: Diagnosis not present

## 2021-09-28 DIAGNOSIS — N189 Chronic kidney disease, unspecified: Secondary | ICD-10-CM | POA: Diagnosis not present

## 2021-09-28 DIAGNOSIS — I129 Hypertensive chronic kidney disease with stage 1 through stage 4 chronic kidney disease, or unspecified chronic kidney disease: Secondary | ICD-10-CM | POA: Diagnosis not present

## 2021-09-28 DIAGNOSIS — E1122 Type 2 diabetes mellitus with diabetic chronic kidney disease: Secondary | ICD-10-CM | POA: Diagnosis not present

## 2021-10-24 ENCOUNTER — Other Ambulatory Visit: Payer: Self-pay

## 2021-10-24 DIAGNOSIS — E119 Type 2 diabetes mellitus without complications: Secondary | ICD-10-CM

## 2021-10-24 DIAGNOSIS — I10 Essential (primary) hypertension: Secondary | ICD-10-CM

## 2021-10-24 MED ORDER — LISINOPRIL 10 MG PO TABS: 10.0000 mg | ORAL_TABLET | Freq: Every day | ORAL | 0 refills | Status: DC

## 2021-10-24 MED ORDER — EMPAGLIFLOZIN 10 MG PO TABS: 10.0000 mg | ORAL_TABLET | Freq: Every day | ORAL | 0 refills | Status: DC

## 2021-10-25 ENCOUNTER — Other Ambulatory Visit: Payer: Self-pay | Admitting: Nephrology

## 2021-10-25 ENCOUNTER — Other Ambulatory Visit (HOSPITAL_COMMUNITY): Payer: Self-pay | Admitting: Nephrology

## 2021-10-25 DIAGNOSIS — I129 Hypertensive chronic kidney disease with stage 1 through stage 4 chronic kidney disease, or unspecified chronic kidney disease: Secondary | ICD-10-CM

## 2021-10-25 DIAGNOSIS — R809 Proteinuria, unspecified: Secondary | ICD-10-CM

## 2021-10-25 DIAGNOSIS — E1122 Type 2 diabetes mellitus with diabetic chronic kidney disease: Secondary | ICD-10-CM

## 2021-10-25 DIAGNOSIS — E559 Vitamin D deficiency, unspecified: Secondary | ICD-10-CM

## 2021-10-26 DIAGNOSIS — E210 Primary hyperparathyroidism: Secondary | ICD-10-CM | POA: Diagnosis not present

## 2021-10-26 DIAGNOSIS — N189 Chronic kidney disease, unspecified: Secondary | ICD-10-CM | POA: Diagnosis not present

## 2021-10-26 DIAGNOSIS — I129 Hypertensive chronic kidney disease with stage 1 through stage 4 chronic kidney disease, or unspecified chronic kidney disease: Secondary | ICD-10-CM | POA: Diagnosis not present

## 2021-10-26 DIAGNOSIS — R809 Proteinuria, unspecified: Secondary | ICD-10-CM | POA: Diagnosis not present

## 2021-11-01 ENCOUNTER — Other Ambulatory Visit (HOSPITAL_COMMUNITY): Payer: Self-pay | Admitting: Nephrology

## 2021-11-02 ENCOUNTER — Ambulatory Visit (HOSPITAL_COMMUNITY)
Admission: RE | Admit: 2021-11-02 | Discharge: 2021-11-02 | Disposition: A | Discharge status: Home / Self Care | Payer: BC Managed Care – PPO | Source: Ambulatory Visit | Attending: Nephrology | Admitting: Nephrology

## 2021-11-02 ENCOUNTER — Other Ambulatory Visit: Payer: Self-pay

## 2021-11-02 ENCOUNTER — Other Ambulatory Visit (HOSPITAL_COMMUNITY): Payer: Self-pay | Admitting: Nurse Practitioner

## 2021-11-02 ENCOUNTER — Encounter: Payer: Self-pay | Admitting: Gastroenterology

## 2021-11-02 DIAGNOSIS — E559 Vitamin D deficiency, unspecified: Secondary | ICD-10-CM | POA: Diagnosis not present

## 2021-11-02 DIAGNOSIS — N189 Chronic kidney disease, unspecified: Secondary | ICD-10-CM | POA: Diagnosis not present

## 2021-11-02 DIAGNOSIS — E1122 Type 2 diabetes mellitus with diabetic chronic kidney disease: Secondary | ICD-10-CM | POA: Diagnosis not present

## 2021-11-02 DIAGNOSIS — R809 Proteinuria, unspecified: Secondary | ICD-10-CM | POA: Diagnosis not present

## 2021-11-02 DIAGNOSIS — E1129 Type 2 diabetes mellitus with other diabetic kidney complication: Secondary | ICD-10-CM | POA: Insufficient documentation

## 2021-11-02 DIAGNOSIS — I129 Hypertensive chronic kidney disease with stage 1 through stage 4 chronic kidney disease, or unspecified chronic kidney disease: Secondary | ICD-10-CM | POA: Diagnosis not present

## 2021-11-02 DIAGNOSIS — N281 Cyst of kidney, acquired: Secondary | ICD-10-CM | POA: Diagnosis not present

## 2021-11-02 DIAGNOSIS — Z1231 Encounter for screening mammogram for malignant neoplasm of breast: Secondary | ICD-10-CM

## 2021-11-02 DIAGNOSIS — N1330 Unspecified hydronephrosis: Secondary | ICD-10-CM | POA: Diagnosis not present

## 2021-11-03 ENCOUNTER — Other Ambulatory Visit (HOSPITAL_COMMUNITY): Payer: Self-pay | Admitting: Nephrology

## 2021-11-03 DIAGNOSIS — E210 Primary hyperparathyroidism: Secondary | ICD-10-CM

## 2021-11-09 ENCOUNTER — Encounter: Payer: Self-pay | Admitting: "Endocrinology

## 2021-11-09 ENCOUNTER — Telehealth: Payer: Self-pay | Admitting: Nurse Practitioner

## 2021-11-09 ENCOUNTER — Ambulatory Visit: Payer: BC Managed Care – PPO | Admitting: "Endocrinology

## 2021-11-09 ENCOUNTER — Other Ambulatory Visit: Payer: Self-pay

## 2021-11-09 VITALS — BP 138/82 | HR 84 | Ht 61.0 in | Wt 171.0 lb

## 2021-11-09 DIAGNOSIS — E1122 Type 2 diabetes mellitus with diabetic chronic kidney disease: Secondary | ICD-10-CM | POA: Diagnosis not present

## 2021-11-09 DIAGNOSIS — N1831 Chronic kidney disease, stage 3a: Secondary | ICD-10-CM | POA: Insufficient documentation

## 2021-11-09 DIAGNOSIS — E212 Other hyperparathyroidism: Secondary | ICD-10-CM

## 2021-11-09 DIAGNOSIS — E782 Mixed hyperlipidemia: Secondary | ICD-10-CM | POA: Diagnosis not present

## 2021-11-09 DIAGNOSIS — M8000XA Age-related osteoporosis with current pathological fracture, unspecified site, initial encounter for fracture: Secondary | ICD-10-CM

## 2021-11-09 MED ORDER — ALENDRONATE SODIUM 70 MG PO TABS: 70.0000 mg | ORAL_TABLET | ORAL | 11 refills | Status: DC

## 2021-11-09 NOTE — Progress Notes (Signed)
Endocrinology Consult Note       11/09/2021, 12:22 PM   Subjective:    Patient ID: Teresa Soto, female    DOB: [birth year].  Teresa Soto is being seen in consultation for management of currently uncontrolled symptomatic diabetes requested by  Ivy Lynn, NP.   Past Medical History:  Diagnosis Date   Arthritis    Arthritis of knee, right 08/12/2014   Asthma    Asthma    Phreesia 06/16/2020   Chronic back pain    Chronic constipation    Depression    Diabetes mellitus without complication ([place])    [employer]reesia 06/16/2020   Heart murmur    in childhood   History of bronchitis    History of urinary tract infection    Hyperlipidemia    Phreesia 06/16/2020   Hypertension    Imbalance    Meniere disease    Neuropathic pain    Osteoporosis    Phreesia 06/16/2020   Parathyroid adenoma    Poor fine motor skills    secondary to CVA per right side    Pre-diabetes    PTSD (post-traumatic stress disorder)    Restless legs syndrome 2007 approx   Seasonal allergies    Shortness of breath dyspnea    coldness    Stroke [facility])    Vertigo     Past Surgical History:  Procedure Laterality Date   BACK SURGERY     CESAREAN SECTION     x 2   CESAREAN SECTION N/A    [employer]reesia 06/16/2020   JOINT REPLACEMENT N/A    Phreesia 06/16/2020   PARATHYROIDECTOMY N/A 06/27/2016   Procedure: MINIMALLY INVASIVE PARATHYROIDECTOMY;  Surgeon: Greer Pickerel, MD;  Location: [facility];  Service: General;  Laterality: N/A;   SHOULDER ARTHROSCOPY W/ ROTATOR CUFF REPAIR     to right shoulder   SPINE SURGERY  12/2010   ruptd L1 L2 , Dr Carloyn Manner   TONSILLECTOMY     TOTAL KNEE ARTHROPLASTY Right 08/12/2014   Procedure: RIGHT TOTAL KNEE ARTHROPLASTY;  Surgeon: Carole Civil, MD;  Location: [facility];  Service: Orthopedics;  Laterality: Right;   TUBAL LIGATION      Social History   Socioeconomic History   Marital status:  Married    Spouse name: Not on file   Number of children: Not on file   Years of education: Not on file   Highest education level: Not on file  Occupational History   Not on file  Tobacco Use   Smoking status: Former    Packs/day: 2.00    Years: 6.00    Pack years: 12.00    Types: Cigarettes    Quit date: 10/09/1982    Years since quitting: 39.1   Smokeless tobacco: Never   Tobacco comments:    quit in 1984  Vaping Use   Vaping Use: Former  Substance and Sexual Activity   Alcohol use: Yes    Comment: 4oz daily   Drug use: No   Sexual activity: Yes    Birth control/protection: Surgical  Other Topics Concern   Not on file  Social History Narrative  Not on file   Social Determinants of Health   Financial Resource Strain: Not on file  Food Insecurity: Not on file  Transportation Needs: Not on file  Physical Activity: Not on file  Stress: Not on file  Social Connections: Not on file    Family History  Problem Relation Age of Onset   Hypertension Mother    Hyperlipidemia Mother    Dementia Paternal Grandmother     Outpatient Encounter Medications as of 11/09/2021  Medication Sig   alendronate (FOSAMAX) 70 MG tablet Take 1 tablet (70 mg total) by mouth every 7 (seven) days. Take with a full glass of water on an empty stomach.   ondansetron (ZOFRAN) 4 MG tablet Take by mouth.   albuterol (VENTOLIN HFA) 108 (90 Base) MCG/ACT inhaler Inhale 2 puffs into the lungs every 6 (six) hours as needed for wheezing or shortness of breath.   aspirin EC 81 MG tablet Take 81 mg by mouth daily. Swallow whole.   Blood Glucose Monitoring Suppl (BLOOD GLUCOSE SYSTEM PAK) KIT Please dispense based on patient and insurance preference. Use as directed to monitor FSBS 1x daily. Dx: E11.9.   Cholecalciferol 125 MCG (5000 UT) capsule Take by mouth.   empagliflozin (JARDIANCE) 10 MG TABS tablet Take 1 tablet (10 mg total) by mouth daily.   escitalopram (LEXAPRO) 20 MG tablet Take 1 tablet (20 mg  total) by mouth daily.   fenofibrate (TRICOR) 145 MG tablet Take 1 tablet (145 mg total) by mouth daily.   ferrous sulfate 325 (65 FE) MG EC tablet Take 1 tablet by mouth every morning.   gabapentin (NEURONTIN) 300 MG capsule Take 1 capsule (300 mg total) by mouth 2 (two) times daily.   ibuprofen (ADVIL) 200 MG tablet Take by mouth.   ketoconazole (NIZORAL) 2 % shampoo Apply 1 application topically 2 (two) times a week.   Lancet Devices MISC Please dispense based on patient and insurance preference. Use as directed to monitor FSBS 1x daily. Dx: E11.9.   Lancets MISC Please dispense based on patient and insurance preference. Use as directed to monitor FSBS 1x daily. Dx: E11.9.   lisinopril (ZESTRIL) 10 MG tablet Take 1 tablet (10 mg total) by mouth daily. (Patient taking differently: Take 20 mg by mouth daily.)   meclizine (ANTIVERT) 25 MG tablet Take by mouth.   montelukast (SINGULAIR) 10 MG tablet Take 1 tablet (10 mg total) by mouth every evening.   polyethylene glycol (MIRALAX / GLYCOLAX) packet Take 17 g by mouth daily. (Patient taking differently: Take 17 g by mouth every other day.)   pramipexole (MIRAPEX) 0.25 MG tablet Take 1 tablet (0.25 mg total) by mouth daily with breakfast.   [DISCONTINUED] BIOTIN PO Take 1 tablet by mouth every evening.    [DISCONTINUED] fluconazole (DIFLUCAN) 150 MG tablet Take 2 tablets (300 mg total) by mouth every 7 (seven) days. (Patient not taking: Reported on 08/16/2021)   [DISCONTINUED] naproxen (NAPROSYN) 500 MG tablet Take 500 mg by mouth 2 (two) times daily with a meal.   No facility-administered encounter medications on file as of 11/09/2021.    ALLERGIES: Allergies  Allergen Reactions   Other Nausea And Vomiting    Peppers   Pneumococcal Vaccines     Had rash, given both flu and PNA at same time    Statins    Influenza Vaccines Rash    VACCINATION STATUS: Immunization History  Administered Date(s) Administered   Influenza Whole 06/28/2010    PFIZER(Purple Top)SARS-COV-2 Vaccination 03/09/2020, 04/08/2020  Td 03/30/2010   Tdap 01/02/2013    Diabetes She presents for her initial diabetic visit. She has type 2 diabetes mellitus. Her disease course has been stable. There are no hypoglycemic associated symptoms. Pertinent negatives for hypoglycemia include no confusion, headaches, pallor or seizures. Pertinent negatives for diabetes include no chest pain, no fatigue, no polydipsia, no polyphagia and no polyuria. There are no hypoglycemic complications. Symptoms are stable. Diabetic complications include a CVA and nephropathy. Risk factors for coronary artery disease include dyslipidemia, diabetes mellitus, hypertension, post-menopausal, obesity, family history, tobacco exposure and sedentary lifestyle. Current diabetic treatment includes oral agent (monotherapy). (She did not bring any logs nor meter with her.  Her recent A1c was found to be 6.7%.  She is taking Jardiance 10 mg p.o. daily at breakfast.   ) An ACE inhibitor/angiotensin II receptor blocker is being taken.  Hyperlipidemia This is a chronic problem. The current episode started more than 1 year ago. Exacerbating diseases include diabetes and obesity. Pertinent negatives include no chest pain, myalgias or shortness of breath. Current antihyperlipidemic treatment includes fibric acid derivatives. Risk factors for coronary artery disease include diabetes mellitus, dyslipidemia, hypertension, a sedentary lifestyle, post-menopausal, family history and obesity.    Review of Systems  Constitutional:  Negative for chills, fatigue, fever and unexpected weight change.  HENT:  Negative for trouble swallowing and voice change.   Eyes:  Negative for visual disturbance.  Respiratory:  Negative for cough, shortness of breath and wheezing.   Cardiovascular:  Negative for chest pain, palpitations and leg swelling.  Gastrointestinal:  Negative for diarrhea, nausea and vomiting.  Endocrine:  Negative for cold intolerance, heat intolerance, polydipsia, polyphagia and polyuria.  Musculoskeletal:  Negative for arthralgias and myalgias.  Skin:  Negative for color change, pallor, rash and wound.  Neurological:  Negative for seizures and headaches.  Psychiatric/Behavioral:  Negative for confusion and suicidal ideas.    Objective:    Vitals with BMI 11/09/2021 08/16/2021 07/11/2021  Height _0  _1  _2   Weight 171 lbs 168 lbs 173 lbs  BMI 32.33 41.32 44.0  Systolic 102 725 366  Diastolic 82 87 80  Pulse 84 100 80    BP 138/82    Pulse 84    Ht _3  (1.549 m)    Wt 171 lb (77.6 kg)    BMI 32.31 kg/m   Wt Readings from Last 3 Encounters:  11/09/21 171 lb (77.6 kg)  08/16/21 168 lb (76.2 kg)  07/11/21 173 lb (78.5 kg)     Physical Exam Constitutional:      Appearance: She is well-developed.  HENT:     Head: Normocephalic and atraumatic.  Neck:     Thyroid: No thyromegaly.     Trachea: No tracheal deviation.  Cardiovascular:     Rate and Rhythm: Normal rate and regular rhythm.  Pulmonary:     Effort: Pulmonary effort is normal.  Abdominal:     Tenderness: There is no abdominal tenderness. There is no guarding.  Musculoskeletal:        General: Normal range of motion.     Cervical back: Normal range of motion and neck supple.     Comments: She walks with with a cane as a result of her previous CVA.  Skin:    General: Skin is warm and dry.     Coloration: Skin is not pale.     Findings: No erythema or rash.  Neurological:     Mental Status: She is alert and  oriented to person, place, and time.     Cranial Nerves: No cranial nerve deficit.     Coordination: Coordination normal.     Deep Tendon Reflexes: Reflexes are normal and symmetric.  Psychiatric:        Judgment: Judgment normal.      CMP ( most recent) CMP     Component Value Date/Time   NA 138 05/13/2021 1030   K 4.5 05/13/2021 1030   CL 104 05/13/2021 1030   CO2 22 05/13/2021 1030   GLUCOSE  163 (H) 05/13/2021 1030   GLUCOSE 89 06/17/2020 1117   GLUCOSE 77 05/23/2017 0946   BUN 20 05/13/2021 1030   CREATININE 1.19 (H) 05/13/2021 1030   CREATININE 1.18 (H) 06/17/2020 1117   CALCIUM 10.5 (H) 05/13/2021 1030   PROT 6.6 05/13/2021 1030   ALBUMIN 4.1 05/13/2021 1030   AST 18 05/13/2021 1030   ALT 19 05/13/2021 1030   ALKPHOS 48 05/13/2021 1030   BILITOT 0.2 05/13/2021 1030   GFRNONAA 49 (L) 06/17/2020 1117   GFRAA 57 (L) 06/17/2020 1117     Diabetic Labs (most recent): Lab Results  Component Value Date   HGBA1C 6.7 (H) 08/16/2021   HGBA1C 7.3 (H) 05/13/2021   HGBA1C 7.3 (H) 06/17/2020     Lipid Panel ( most recent) Lipid Panel     Component Value Date/Time   CHOL 174 05/13/2021 1030   TRIG 198 (H) 05/13/2021 1030   HDL 41 05/13/2021 1030   CHOLHDL 4.2 05/13/2021 1030   CHOLHDL 3.8 06/17/2020 1117   VLDL 47 (H) 03/28/2017 0840   LDLCALC 99 05/13/2021 1030   LDLCALC 108 (H) 06/17/2020 1117   LABVLDL 34 05/13/2021 1030      Lab Results  Component Value Date   TSH 0.67 06/17/2020   TSH 1.48 11/22/2016   TSH 0.798 07/28/2015   TSH 0.488 11/07/2011   TSH 1.102 12/03/2008   TSH 1.102 12/03/2008   TSH 1.350 10/22/2007   FREET4 1.2 06/17/2020           Assessment & Plan:   1. Type 2 diabetes mellitus with stage 3a chronic kidney disease, without long-term current use of insulin (HCC)    - Teresa Soto has currently uncontrolled symptomatic type 2 DM several years ago.  Her most recent A1c 6.7%.    - I had a long discussion with her about the progressive nature of diabetes and the pathology behind its complications. -her diabetes is complicated by CVA, CKD and she remains at a high risk for more acute and chronic complications which include CAD, CVA, CKD, retinopathy, and neuropathy. These are all discussed in detail with her.  - I discussed all available options of managing her diabetes including de-escalation of medications.  She will be  continued on Jardiance 10 mg p.o. daily at breakfast. -She has several options to add if she loses control of diabetes.   2) hyperparathyroidism/hypercalcemia -This patient was previously seen in this clinic.  In 2017, she underwent left inferior parathyroidectomy for hyperparathyroidism/hypercalcemia complicated by osteoporosis, history of nephrolithiasis.  She did not return for follow-up.  Her most recent labs are showing several signs of hypercalcemia as well as elevated PTH.  24-hour urine calcium measurement performed by her nephrologist was 800 mg / 24 hours. She failed to pick up her alendronate prescribed during her last visit.  She will be approached for repeat bone density, and referral to Dr. Armandina Gemma for possible surgical reexploration for recurrent primary hyperparathyroidism.  3) Blood Pressure /Hypertension:  her blood pressure is  controlled to target.   she is advised to continue her current medications including lisinopril 20 mg p.o. daily with breakfast . 4) Lipids/Hyperlipidemia:   Review of her recent lipid panel showed uncontrolled  LDL at 99 .  she  is advised to continue    fenofibrate 145 mg p.o. daily.  She will be assessed for the utility of statins next visit.   - she is  advised to maintain close follow up with Ivy Lynn, NP for primary care needs, as well as her other providers for optimal and coordinated care.   I spent 61 minutes in the care of the patient today including review of labs from [place], Lipids, Thyroid Function, Hematology (current and previous including abstractions from other facilities); face-to-face time discussing  her blood glucose readings/logs, discussing hypoglycemia and hyperglycemia episodes and symptoms, medications doses, her options of short and long term treatment based on the latest standards of care / guidelines;  discussion about incorporating lifestyle medicine;  and documenting the encounter.     Please refer to Patient  Instructions for Blood Glucose Monitoring and Insulin/Medications Dosing Guide"  in media tab for additional information. Please  also refer to " Patient Self Inventory" in the Media  tab for reviewed elements of pertinent patient history.  Doristine Church Nicolini participated in the discussions, expressed understanding, and voiced agreement with the above plans.  All questions were answered to her satisfaction. she is encouraged to contact clinic should she have any questions or concerns prior to her return visit.   Follow up plan: - Return in about 8 weeks (around 01/04/2022) for F/U with Labs after Surgery, A1c -NV.  Glade Lloyd, MD [facility] [address] [place], [place] [address] Phone: [phone]  Fax: [phone]    11/09/2021, 12:22 PM  This note was partially dictated with voice recognition software. Similar sounding words can be transcribed inadequately or may not  be corrected upon review.

## 2021-11-09 NOTE — Telephone Encounter (Signed)
Teresa Soto is calling to check on status of med refill for lisinopril-hctz(ZESTRIL) 20-25 MG tablet. Rep is aware that lisinopril (ZESTRIL) 10 MG tablet was sent on 10/24/2021 but pt is requesting the combination refill. Please call back

## 2021-11-11 ENCOUNTER — Telehealth: Payer: Self-pay | Admitting: "Endocrinology

## 2021-11-11 NOTE — Telephone Encounter (Signed)
Patient left a VM stating that she thought Dr Dorris Fetch was sending in a calcium tablet. She checked with [facility] and they do not have anything. Please advise

## 2021-11-11 NOTE — Telephone Encounter (Signed)
Left message to call back  

## 2021-11-14 ENCOUNTER — Ambulatory Visit (HOSPITAL_COMMUNITY)
Admission: RE | Admit: 2021-11-14 | Discharge: 2021-11-14 | Disposition: A | Discharge status: Home / Self Care | Payer: BC Managed Care – PPO | Source: Ambulatory Visit | Attending: Nephrology | Admitting: Nephrology

## 2021-11-14 ENCOUNTER — Other Ambulatory Visit: Payer: Self-pay

## 2021-11-14 ENCOUNTER — Ambulatory Visit (HOSPITAL_COMMUNITY)
Admission: RE | Admit: 2021-11-14 | Discharge: 2021-11-14 | Disposition: A | Discharge status: Home / Self Care | Payer: BC Managed Care – PPO | Source: Ambulatory Visit | Attending: Nurse Practitioner | Admitting: Nurse Practitioner

## 2021-11-14 DIAGNOSIS — D351 Benign neoplasm of parathyroid gland: Secondary | ICD-10-CM | POA: Diagnosis not present

## 2021-11-14 DIAGNOSIS — E210 Primary hyperparathyroidism: Secondary | ICD-10-CM | POA: Insufficient documentation

## 2021-11-14 DIAGNOSIS — Z1231 Encounter for screening mammogram for malignant neoplasm of breast: Secondary | ICD-10-CM | POA: Insufficient documentation

## 2021-11-14 MED ORDER — ALENDRONATE SODIUM 70 MG PO TABS: 70.0000 mg | ORAL_TABLET | ORAL | 11 refills | Status: DC

## 2021-11-14 MED ORDER — TECHNETIUM TC 99M SESTAMIBI - CARDIOLITE
28.0000 | Freq: Once | INTRAVENOUS | Status: AC | PRN
  Administered 2021-11-14: 28 via INTRAVENOUS

## 2021-11-14 NOTE — Telephone Encounter (Signed)
Bone Density scheduled for 11/22/21 arrive at 9:15 @ [facility]. Pt notified. Alos Fosamax resent to [facility]

## 2021-11-14 NOTE — Telephone Encounter (Signed)
Talked with pt, she was talking about fosamax. Advised pt the Rx for fosamax had been sent in to the [facility] this morning. Pt voiced understanding.

## 2021-11-16 ENCOUNTER — Ambulatory Visit: Payer: BC Managed Care – PPO | Admitting: Nurse Practitioner

## 2021-11-16 NOTE — Telephone Encounter (Signed)
Patient no showed her appointment today.

## 2021-11-17 ENCOUNTER — Encounter: Payer: Self-pay | Admitting: Nurse Practitioner

## 2021-11-21 ENCOUNTER — Other Ambulatory Visit: Payer: Self-pay | Admitting: *Deleted

## 2021-11-21 MED ORDER — PRAMIPEXOLE DIHYDROCHLORIDE 0.25 MG PO TABS: 0.2500 mg | ORAL_TABLET | Freq: Every day | ORAL | 0 refills | Status: DC

## 2021-11-21 MED ORDER — MONTELUKAST SODIUM 10 MG PO TABS: 10.0000 mg | ORAL_TABLET | Freq: Every evening | ORAL | 0 refills | Status: DC

## 2021-11-22 ENCOUNTER — Other Ambulatory Visit (HOSPITAL_COMMUNITY): Payer: BC Managed Care – PPO

## 2021-11-30 DIAGNOSIS — E210 Primary hyperparathyroidism: Secondary | ICD-10-CM | POA: Diagnosis not present

## 2021-11-30 DIAGNOSIS — N1832 Chronic kidney disease, stage 3b: Secondary | ICD-10-CM | POA: Diagnosis not present

## 2021-12-06 ENCOUNTER — Other Ambulatory Visit: Payer: Self-pay | Admitting: *Deleted

## 2021-12-15 ENCOUNTER — Telehealth: Payer: BC Managed Care – PPO | Admitting: Family Medicine

## 2021-12-15 ENCOUNTER — Encounter: Payer: Self-pay | Admitting: Family Medicine

## 2021-12-15 DIAGNOSIS — U071 COVID-19: Secondary | ICD-10-CM | POA: Diagnosis not present

## 2021-12-15 MED ORDER — MOLNUPIRAVIR EUA 200MG CAPSULE: 4.0000 | ORAL_CAPSULE | Freq: Two times a day (BID) | ORAL | 0 refills | Status: AC

## 2021-12-15 NOTE — Progress Notes (Signed)
Attempted to call and connect via MyChart Video x 3. No response. Will try again later.  ?

## 2021-12-15 NOTE — Progress Notes (Signed)
Virtual Visit via telephone Note Due to COVID-19 pandemic this visit was conducted virtually. This visit type was conducted due to national recommendations for restrictions regarding the COVID-19 Pandemic (e.g. social distancing, sheltering in place) in an effort to limit this patient's exposure and mitigate transmission in our community. All issues noted in this document were discussed and addressed.  A physical exam was not performed with this format.   I connected with Teresa Soto on 12/15/2021 at 1430 by telephone and verified that I am speaking with the correct person using two identifiers. Teresa Soto is currently located at home and patient is currently with them during visit. The provider, Monia Pouch, FNP is located in their office at time of visit.  I discussed the limitations, risks, security and privacy concerns of performing an evaluation and management service by virtual visit and the availability of in person appointments. I also discussed with the patient that there may be a patient responsible charge related to this service. The patient expressed understanding and agreed to proceed.  Subjective:  Patient ID: Teresa Soto, female    DOB: 08/24/1956, 66 y.o.   MRN: [id number]  Chief Complaint:  Covid Positive   HPI: Teresa Soto is a 66 y.o. female presenting on 12/15/2021 for Covid Positive   Pt reports she has cough, congestion, chills, and rhinorrhea. States symptoms started yesterday. She took a home COVID test today and it was positive.   URI  This is a new problem. The current episode started yesterday. Associated symptoms include congestion, coughing, rhinorrhea and a sore throat. Pertinent negatives include no abdominal pain, chest pain, diarrhea, dysuria, ear pain, headaches, joint pain, joint swelling, nausea, neck pain, plugged ear sensation, rash, sinus pain, sneezing, swollen glands, vomiting or wheezing. She has tried nothing for the symptoms.     Relevant past medical, surgical, family, and social history reviewed and updated as indicated.  Allergies and medications reviewed and updated.   Past Medical History:  Diagnosis Date   Arthritis    Arthritis of knee, right 08/12/2014   Asthma    Asthma    [facility] 06/16/2020   Chronic back pain    Chronic constipation    Depression    Diabetes mellitus without complication ([place])    [employer] 06/16/2020   Heart murmur    in childhood   History of bronchitis    History of urinary tract infection    Hyperlipidemia    [facility] 06/16/2020   Hypertension    Imbalance    Meniere disease    Neuropathic pain    Osteoporosis    [facility]ia 06/16/2020   Parathyroid adenoma    Poor fine motor skills    secondary to CVA per right side    Pre-diabetes    PTSD (post-traumatic stress disorder)    Restless legs syndrome 2007 approx   Seasonal allergies    Shortness of breath dyspnea    coldness    Stroke [facility])    Vertigo     Past Surgical History:  Procedure Laterality Date   BACK SURGERY     CESAREAN SECTION     x 2   CESAREAN SECTION N/A    [facility] 06/16/2020   JOINT REPLACEMENT N/A    [facility] 06/16/2020   PARATHYROIDECTOMY N/A 06/27/2016   Procedure: MINIMALLY INVASIVE PARATHYROIDECTOMY;  Surgeon: Greer Pickerel, MD;  Location: [facility];  Service: General;  Laterality: N/A;   SHOULDER ARTHROSCOPY W/ ROTATOR CUFF REPAIR     to  right shoulder   SPINE SURGERY  12/2010   ruptd L1 L2 , Dr Carloyn Manner   TONSILLECTOMY     TOTAL KNEE ARTHROPLASTY Right 08/12/2014   Procedure: RIGHT TOTAL KNEE ARTHROPLASTY;  Surgeon: Carole Civil, MD;  Location: [facility];  Service: Orthopedics;  Laterality: Right;   TUBAL LIGATION      Social History   Socioeconomic History   Marital status: Married    Spouse name: Not on file   Number of children: Not on file   Years of education: Not on file   Highest education level: Not on file  Occupational History   Not on file  Tobacco Use   Smoking  status: Former    Packs/day: 2.00    Years: 6.00    Pack years: 12.00    Types: Cigarettes    Quit date: 10/09/1982    Years since quitting: 39.2   Smokeless tobacco: Never   Tobacco comments:    quit in 1984  Vaping Use   Vaping Use: Former  Substance and Sexual Activity   Alcohol use: Yes    Comment: 4oz daily   Drug use: No   Sexual activity: Yes    Birth control/protection: Surgical  Other Topics Concern   Not on file  Social History Narrative   Not on file   Social Determinants of Health   Financial Resource Strain: Not on file  Food Insecurity: Not on file  Transportation Needs: Not on file  Physical Activity: Not on file  Stress: Not on file  Social Connections: Not on file  Intimate Partner Violence: Not on file    Outpatient Encounter Medications as of 12/15/2021  Medication Sig   molnupiravir EUA (LAGEVRIO) 200 mg CAPS capsule Take 4 capsules (800 mg total) by mouth 2 (two) times daily for 5 days.   albuterol (VENTOLIN HFA) 108 (90 Base) MCG/ACT inhaler Inhale 2 puffs into the lungs every 6 (six) hours as needed for wheezing or shortness of breath.   alendronate (FOSAMAX) 70 MG tablet Take 1 tablet (70 mg total) by mouth every 7 (seven) days. Take with a full glass of water on an empty stomach.   aspirin 81 MG EC tablet Take by mouth.   aspirin EC 81 MG tablet Take 81 mg by mouth daily. Swallow whole.   Blood Glucose Monitoring Suppl (BLOOD GLUCOSE SYSTEM PAK) KIT Please dispense based on patient and insurance preference. Use as directed to monitor FSBS 1x daily. Dx: E11.9.   cetirizine-pseudoephedrine (ZYRTEC-D) 5-120 MG tablet Take by mouth.   Cholecalciferol 125 MCG (5000 UT) capsule Take by mouth.   cinacalcet (SENSIPAR) 30 MG tablet Take by mouth.   empagliflozin (JARDIANCE) 10 MG TABS tablet Take 1 tablet (10 mg total) by mouth daily.   escitalopram (LEXAPRO) 20 MG tablet Take 1 tablet (20 mg total) by mouth daily.   fenofibrate (TRICOR) 145 MG tablet Take 1  tablet (145 mg total) by mouth daily.   fenofibrate (TRICOR) 145 MG tablet Take by mouth.   ferrous sulfate 325 (65 FE) MG EC tablet Take 1 tablet by mouth every morning.   fluticasone (FLONASE) 50 MCG/ACT nasal spray Place into the nose.   gabapentin (NEURONTIN) 300 MG capsule Take 1 capsule (300 mg total) by mouth 2 (two) times daily.   gabapentin (NEURONTIN) 300 MG capsule Take by mouth.   ibuprofen (ADVIL) 200 MG tablet Take by mouth.   ketoconazole (NIZORAL) 2 % shampoo Apply 1 application topically 2 (two) times a week.  Lancet Devices MISC Please dispense based on patient and insurance preference. Use as directed to monitor FSBS 1x daily. Dx: E11.9.   Lancets MISC Please dispense based on patient and insurance preference. Use as directed to monitor FSBS 1x daily. Dx: E11.9.   lisinopril (ZESTRIL) 10 MG tablet Take 1 tablet (10 mg total) by mouth daily. (Patient taking differently: Take 20 mg by mouth daily.)   lisinopril (ZESTRIL) 10 MG tablet Take by mouth.   lisinopril (ZESTRIL) 20 MG tablet Take by mouth.   meclizine (ANTIVERT) 25 MG tablet Take by mouth.   montelukast (SINGULAIR) 10 MG tablet Take 1 tablet (10 mg total) by mouth every evening. (NEEDS TO BE SEEN BEFORE NEXT REFILL)   Multiple Vitamins-Minerals (CENTRUM SILVER 50+WOMEN) TABS    naproxen (NAPROSYN) 500 MG tablet Take by mouth.   ondansetron (ZOFRAN) 4 MG tablet Take by mouth.   polyethylene glycol (MIRALAX / GLYCOLAX) packet Take 17 g by mouth daily. (Patient taking differently: Take 17 g by mouth every other day.)   polyethylene glycol powder (GLYCOLAX/MIRALAX) 17 GM/SCOOP powder Take by mouth.   pramipexole (MIRAPEX) 0.25 MG tablet Take 1 tablet (0.25 mg total) by mouth daily with breakfast. (NEEDS TO BE SEEN BEFORE NEXT REFILL)   No facility-administered encounter medications on file as of 12/15/2021.    Allergies  Allergen Reactions   Other Nausea And Vomiting    Peppers   Pneumococcal Vaccines     Had rash,  given both flu and PNA at same time    Statins    Influenza Vaccines Rash    Review of Systems  Constitutional:  Positive for activity change, appetite change, chills and fatigue. Negative for diaphoresis, fever and unexpected weight change.  HENT:  Positive for congestion, postnasal drip, rhinorrhea and sore throat. Negative for dental problem, drooling, ear discharge, ear pain, facial swelling, hearing loss, mouth sores, nosebleeds, sinus pressure, sinus pain, sneezing, tinnitus, trouble swallowing and voice change.   Respiratory:  Positive for cough. Negative for apnea, choking, chest tightness, shortness of breath, wheezing and stridor.   Cardiovascular:  Negative for chest pain, palpitations and leg swelling.  Gastrointestinal:  Negative for abdominal pain, diarrhea, nausea and vomiting.  Genitourinary:  Negative for decreased urine volume, difficulty urinating and dysuria.  Musculoskeletal:  Negative for arthralgias, joint pain and neck pain.  Skin:  Negative for rash.  Neurological:  Negative for dizziness, weakness and headaches.  Psychiatric/Behavioral:  Negative for confusion.   All other systems reviewed and are negative.       Observations/Objective: No vital signs or physical exam, this was a virtual health encounter.  Pt alert and oriented, answers all questions appropriately, and able to speak in full sentences.    Assessment and Plan: Patryce was seen today for covid positive.  Diagnoses and all orders for this visit:  Positive self-administered antigen test for COVID-19 Symptomatic care discussed in detail. Due to age and comorbidities, will start antiviral therapy. Pt aware to report any new, worsening, or persistent symptoms.  -     molnupiravir EUA (LAGEVRIO) 200 mg CAPS capsule; Take 4 capsules (800 mg total) by mouth 2 (two) times daily for 5 days.     Follow Up Instructions: Return if symptoms worsen or fail to improve.    I discussed the assessment and  treatment plan with the patient. The patient was provided an opportunity to ask questions and all were answered. The patient agreed with the plan and demonstrated an understanding of the instructions.  The patient was advised to call back or seek an in-person evaluation if the symptoms worsen or if the condition fails to improve as anticipated.  The above assessment and management plan was discussed with the patient. The patient verbalized understanding of and has agreed to the management plan. Patient is aware to call the clinic if they develop any new symptoms or if symptoms persist or worsen. Patient is aware when to return to the clinic for a follow-up visit. Patient educated on when it is appropriate to go to the emergency department.    I provided 15 minutes of time during this telephone encounter.   Monia Pouch, FNP-C [facility] [address] [place], [place] [address] [phone] 12/15/2021

## 2021-12-21 ENCOUNTER — Other Ambulatory Visit: Payer: Self-pay | Admitting: *Deleted

## 2021-12-21 MED ORDER — PRAMIPEXOLE DIHYDROCHLORIDE 0.25 MG PO TABS: 0.2500 mg | ORAL_TABLET | Freq: Every day | ORAL | 0 refills | Status: DC

## 2021-12-21 MED ORDER — MONTELUKAST SODIUM 10 MG PO TABS: 10.0000 mg | ORAL_TABLET | Freq: Every evening | ORAL | 0 refills | Status: DC

## 2021-12-21 NOTE — Addendum Note (Signed)
Addended by: Antonietta Barcelona D on: 12/21/2021 08:56 AM ? ? Modules accepted: Orders ? ?

## 2021-12-26 ENCOUNTER — Ambulatory Visit: Payer: BC Managed Care – PPO | Admitting: Nurse Practitioner

## 2022-01-02 ENCOUNTER — Other Ambulatory Visit: Payer: Self-pay | Admitting: Family Medicine

## 2022-01-02 DIAGNOSIS — U071 COVID-19: Secondary | ICD-10-CM

## 2022-01-04 ENCOUNTER — Ambulatory Visit: Payer: BC Managed Care – PPO | Admitting: "Endocrinology

## 2022-01-09 ENCOUNTER — Encounter: Payer: Self-pay | Admitting: Nurse Practitioner

## 2022-01-09 ENCOUNTER — Ambulatory Visit: Payer: BC Managed Care – PPO | Admitting: Nurse Practitioner

## 2022-01-09 VITALS — BP 113/70 | HR 70 | Temp 98.9°F | Ht 61.0 in | Wt 169.0 lb

## 2022-01-09 DIAGNOSIS — E782 Mixed hyperlipidemia: Secondary | ICD-10-CM | POA: Diagnosis not present

## 2022-01-09 DIAGNOSIS — N1831 Chronic kidney disease, stage 3a: Secondary | ICD-10-CM

## 2022-01-09 DIAGNOSIS — E1122 Type 2 diabetes mellitus with diabetic chronic kidney disease: Secondary | ICD-10-CM

## 2022-01-09 DIAGNOSIS — I10 Essential (primary) hypertension: Secondary | ICD-10-CM | POA: Diagnosis not present

## 2022-01-09 DIAGNOSIS — E119 Type 2 diabetes mellitus without complications: Secondary | ICD-10-CM

## 2022-01-09 DIAGNOSIS — Z1211 Encounter for screening for malignant neoplasm of colon: Secondary | ICD-10-CM

## 2022-01-09 DIAGNOSIS — Z1212 Encounter for screening for malignant neoplasm of rectum: Secondary | ICD-10-CM

## 2022-01-09 LAB — BAYER DCA HB A1C WAIVED: HB A1C (BAYER DCA - WAIVED): 6.6 % — ABNORMAL HIGH (ref 4.8–5.6)

## 2022-01-09 NOTE — Assessment & Plan Note (Signed)
Completed assessment, no new worsening signs and symptoms of hyperlipidemia.  Labs completed-lipid panel.  Follow-up in 3 months. ?

## 2022-01-09 NOTE — Progress Notes (Addendum)
? ?Established Patient Office Visit ? ?Subjective:  ?Patient ID: Teresa Soto, female    DOB: 02-16-1956  Age: 66 y.o. MRN: [id number] ? ?CC:  ?Chief Complaint  ?Patient presents with  ? chronic disease management  ? ? ?HPI ?Teresa Soto presents for The patient presents with history of type 2  diabetes mellitus without complications. Patient was diagnosed in 11/09/2021. Compliance with treatment has been good; the patient takes medication as directed , maintains a diabetic diet and an exercise regimen , follows up as directed , and is keeping a glucose diary current A1c 6.6. Patient specifically denies associated symptoms, including blurred vision, fatigue, polydipsia, polyphagia and polyuria . Patient denies hypoglycemia. In regard to preventative care, the patient performs foot self-exams daily and last ophthalmology exam was in: patient will schedule.  ? ?Pt presents for follow up of hypertension. Patient was diagnosed in 09/05/2006 the patient is tolerating the medication well without side effects. Compliance with treatment has been good; including taking medication as directed , maintains a healthy diet and regular exercise regimen , and following up as directed.  ? ?Past Medical History:  ?Diagnosis Date  ? Arthritis   ? Arthritis of knee, right 08/12/2014  ? Asthma   ? Asthma   ? [facility] 06/16/2020  ? Chronic back pain   ? Chronic constipation   ? Depression   ? Diabetes mellitus without complication ([place])   ? [facility] 06/16/2020  ? Heart murmur   ? in childhood  ? History of bronchitis   ? History of urinary tract infection   ? Hyperlipidemia   ? [facility] 06/16/2020  ? Hypertension   ? Imbalance   ? Meniere disease   ? Neuropathic pain   ? Osteoporosis   ? [facility]ia 06/16/2020  ? Parathyroid adenoma   ? Poor fine motor skills   ? secondary to CVA per right side   ? Pre-diabetes   ? PTSD (post-traumatic stress disorder)   ? Restless legs syndrome 2007 approx  ? Seasonal allergies   ? Shortness of breath  dyspnea   ? coldness   ? Stroke [facility])   ? Vertigo   ? ? ?Past Surgical History:  ?Procedure Laterality Date  ? BACK SURGERY    ? CESAREAN SECTION    ? x 2  ? CESAREAN SECTION N/A   ? [facility] 06/16/2020  ? JOINT REPLACEMENT N/A   ? [facility] 06/16/2020  ? PARATHYROIDECTOMY N/A 06/27/2016  ? Procedure: MINIMALLY INVASIVE PARATHYROIDECTOMY;  Surgeon: Greer Pickerel, MD;  Location: [facility];  Service: General;  Laterality: N/A;  ? SHOULDER ARTHROSCOPY W/ ROTATOR CUFF REPAIR    ? to right shoulder  ? SPINE SURGERY  12/2010  ? ruptd L1 L2 , Dr Carloyn Manner  ? TONSILLECTOMY    ? TOTAL KNEE ARTHROPLASTY Right 08/12/2014  ? Procedure: RIGHT TOTAL KNEE ARTHROPLASTY;  Surgeon: Carole Civil, MD;  Location: [facility];  Service: Orthopedics;  Laterality: Right;  ? TUBAL LIGATION    ? ? ?Family History  ?Problem Relation Age of Onset  ? Hypertension Mother   ? Hyperlipidemia Mother   ? Dementia Paternal Grandmother   ? ? ?Social History  ? ?Socioeconomic History  ? Marital status: Married  ?  Spouse name: Not on file  ? Number of children: Not on file  ? Years of education: Not on file  ? Highest education level: Not on file  ?Occupational History  ? Not on file  ?Tobacco Use  ? Smoking status: Former  ?  Packs/day: 2.00  ?  Years: 6.00  ?  Pack years: 12.00  ?  Types: Cigarettes  ?  Quit date: 10/09/1982  ?  Years since quitting: 39.2  ? Smokeless tobacco: Never  ? Tobacco comments:  ?  quit in 1984  ?Vaping Use  ? Vaping Use: Former  ?Substance and Sexual Activity  ? Alcohol use: Yes  ?  Comment: 4oz daily  ? Drug use: No  ? Sexual activity: Yes  ?  Birth control/protection: Surgical  ?Other Topics Concern  ? Not on file  ?Social History Narrative  ? Not on file  ? ?Social Determinants of Health  ? ?Financial Resource Strain: Not on file  ?Food Insecurity: Not on file  ?Transportation Needs: Not on file  ?Physical Activity: Not on file  ?Stress: Not on file  ?Social Connections: Not on file  ?Intimate Partner Violence: Not on file   ? ? ?Outpatient Medications Prior to Visit  ?Medication Sig Dispense Refill  ? albuterol (VENTOLIN HFA) 108 (90 Base) MCG/ACT inhaler Inhale 2 puffs into the lungs every 6 (six) hours as needed for wheezing or shortness of breath. 8 g 3  ? alendronate (FOSAMAX) 70 MG tablet Take 1 tablet (70 mg total) by mouth every 7 (seven) days. Take with a full glass of water on an empty stomach. 4 tablet 11  ? aspirin 81 MG EC tablet Take by mouth.    ? aspirin EC 81 MG tablet Take 81 mg by mouth daily. Swallow whole.    ? Blood Glucose Monitoring Suppl (BLOOD GLUCOSE SYSTEM PAK) KIT Please dispense based on patient and insurance preference. Use as directed to monitor FSBS 1x daily. Dx: E11.9. 1 each 1  ? cetirizine-pseudoephedrine (ZYRTEC-D) 5-120 MG tablet Take by mouth.    ? Cholecalciferol 125 MCG (5000 UT) capsule Take by mouth.    ? cinacalcet (SENSIPAR) 30 MG tablet Take by mouth.    ? empagliflozin (JARDIANCE) 10 MG TABS tablet Take 1 tablet (10 mg total) by mouth daily. 90 tablet 0  ? escitalopram (LEXAPRO) 20 MG tablet Take 1 tablet (20 mg total) by mouth daily. 90 tablet 1  ? fenofibrate (TRICOR) 145 MG tablet Take 1 tablet (145 mg total) by mouth daily. 90 tablet 1  ? fenofibrate (TRICOR) 145 MG tablet Take by mouth.    ? ferrous sulfate 325 (65 FE) MG EC tablet Take 1 tablet by mouth every morning.    ? fluticasone (FLONASE) 50 MCG/ACT nasal spray Place into the nose.    ? gabapentin (NEURONTIN) 300 MG capsule Take 1 capsule (300 mg total) by mouth 2 (two) times daily. 180 capsule 0  ? gabapentin (NEURONTIN) 300 MG capsule Take by mouth.    ? ibuprofen (ADVIL) 200 MG tablet Take by mouth.    ? ketoconazole (NIZORAL) 2 % shampoo Apply 1 application topically 2 (two) times a week. 120 mL 0  ? Lancet Devices MISC Please dispense based on patient and insurance preference. Use as directed to monitor FSBS 1x daily. Dx: E11.9. 1 each 1  ? Lancets MISC Please dispense based on patient and insurance preference. Use as  directed to monitor FSBS 1x daily. Dx: E11.9. 100 each 1  ? lisinopril (ZESTRIL) 10 MG tablet Take 1 tablet (10 mg total) by mouth daily. (Patient taking differently: Take 20 mg by mouth daily.) 90 tablet 0  ? lisinopril (ZESTRIL) 10 MG tablet Take by mouth.    ? lisinopril (ZESTRIL) 20 MG tablet Take by mouth.    ?  meclizine (ANTIVERT) 25 MG tablet Take by mouth.    ? montelukast (SINGULAIR) 10 MG tablet Take 1 tablet (10 mg total) by mouth every evening. 30 tablet 0  ? Multiple Vitamins-Minerals (CENTRUM SILVER 50+WOMEN) TABS     ? naproxen (NAPROSYN) 500 MG tablet Take by mouth.    ? ondansetron (ZOFRAN) 4 MG tablet Take by mouth.    ? polyethylene glycol (MIRALAX / GLYCOLAX) packet Take 17 g by mouth daily. (Patient taking differently: Take 17 g by mouth every other day.) 14 each 0  ? polyethylene glycol powder (GLYCOLAX/MIRALAX) 17 GM/SCOOP powder Take by mouth.    ? pramipexole (MIRAPEX) 0.25 MG tablet Take 1 tablet (0.25 mg total) by mouth daily with breakfast. 30 tablet 0  ? ?No facility-administered medications prior to visit.  ? ? ?Allergies  ?Allergen Reactions  ? Pneumococcal Vaccine Other (See Comments)  ?  Had rash, given both flu and PNA at same time  ?Had rash, given both flu and PNA at same time   ? Pneumococcal Vaccines   ?  Had rash, given both flu and PNA at same time   ? Statins Other (See Comments)  ? Influenza Vaccines Rash  ? Other Nausea And Vomiting  ?  Peppers ?Peppers ?Peppers  ? ? ?ROS ?Review of Systems  ?Constitutional: Negative.   ?HENT: Negative.    ?Eyes: Negative.   ?Respiratory: Negative.    ?Gastrointestinal: Negative.   ?Genitourinary: Negative.   ?Musculoskeletal: Negative.   ?Skin: Negative.  Negative for rash.  ?Neurological: Negative.   ?Psychiatric/Behavioral: Negative.    ?All other systems reviewed and are negative. ? ?  ?Objective:  ?  ?Physical Exam ?Vitals and nursing note reviewed.  ?Constitutional:   ?   Appearance: Normal appearance. She is obese.  ?HENT:  ?   Head:  Normocephalic.  ?   Right Ear: Ear canal and external ear normal.  ?   Left Ear: Ear canal and external ear normal.  ?   Nose: Nose normal.  ?   Mouth/Throat:  ?   Mouth: Mucous membranes are moist.  ?   Pharynx: Oroph

## 2022-01-09 NOTE — Assessment & Plan Note (Signed)
Diabetes mellitus well-controlled on current medication.  Completed comprehensive foot exam, A1c completed: results 6.7.  Completed labs-CBC, CMP, lipid panel-results pending ?Continue diabetic diet, exercise as tolerated, increase hydration.  Follow-up in 3 months. ?

## 2022-01-09 NOTE — Assessment & Plan Note (Signed)
Hypertension well-controlled on current medication no changes necessary.  Continue on current dose.  Education provided to patient to reduce high caloric and high sodium diet.  Increase hydration follow-up in 3 months. ?

## 2022-01-09 NOTE — Patient Instructions (Signed)
Hypertension, Adult ?Hypertension is another name for high blood pressure. High blood pressure forces your heart to work harder to pump blood. This can cause problems over time. ?There are two numbers in a blood pressure reading. There is a top number (systolic) over a bottom number (diastolic). It is best to have a blood pressure that is below 120/80. Healthy choices can help lower your blood pressure, or you may need medicine to help lower it. ?What are the causes? ?The cause of this condition is not known. Some conditions may be related to high blood pressure. ?What increases the risk? ?Smoking. ?Having type 2 diabetes mellitus, high cholesterol, or both. ?Not getting enough exercise or physical activity. ?Being overweight. ?Having too much fat, sugar, calories, or salt (sodium) in your diet. ?Drinking too much alcohol. ?Having long-term (chronic) kidney disease. ?Having a family history of high blood pressure. ?Age. Risk increases with age. ?Race. You may be at higher risk if you are African American. ?Gender. Men are at higher risk than women before age 45. After age 65, women are at higher risk than men. ?Having obstructive sleep apnea. ?Stress. ?What are the signs or symptoms? ?High blood pressure may not cause symptoms. Very high blood pressure (hypertensive crisis) may cause: ?Headache. ?Feelings of worry or nervousness (anxiety). ?Shortness of breath. ?Nosebleed. ?A feeling of being sick to your stomach (nausea). ?Throwing up (vomiting). ?Changes in how you see. ?Very bad chest pain. ?Seizures. ?How is this treated? ?This condition is treated by making healthy lifestyle changes, such as: ?Eating healthy foods. ?Exercising more. ?Drinking less alcohol. ?Your health care provider may prescribe medicine if lifestyle changes are not enough to get your blood pressure under control, and if: ?Your top number is above 130. ?Your bottom number is above 80. ?Your personal target blood pressure may vary. ?Follow  these instructions at home: ?Eating and drinking ? ?If told, follow the DASH eating plan. To follow this plan: ?Fill one half of your plate at each meal with fruits and vegetables. ?Fill one fourth of your plate at each meal with whole grains. Whole grains include whole-wheat pasta, brown rice, and whole-grain bread. ?Eat or drink low-fat dairy products, such as skim milk or low-fat yogurt. ?Fill one fourth of your plate at each meal with low-fat (lean) proteins. Low-fat proteins include fish, chicken without skin, eggs, beans, and tofu. ?Avoid fatty meat, cured and processed meat, or chicken with skin. ?Avoid pre-made or processed food. ?Eat less than 1,500 mg of salt each day. ?Do not drink alcohol if: ?Your doctor tells you not to drink. ?You are pregnant, may be pregnant, or are planning to become pregnant. ?If you drink alcohol: ?Limit how much you use to: ?0-1 drink a day for women. ?0-2 drinks a day for men. ?Be aware of how much alcohol is in your drink. In the [place] one drink equals one 12 oz bottle of beer (355 mL), one 5 oz glass of wine (148 mL), or one 1? oz glass of hard liquor (44 mL). ?Lifestyle ? ?Work with your doctor to stay at a healthy weight or to lose weight. Ask your doctor what the best weight is for you. ?Get at least 30 minutes of exercise most days of the week. This may include walking, swimming, or biking. ?Get at least 30 minutes of exercise that strengthens your muscles (resistance exercise) at least 3 days a week. This may include lifting weights or doing Pilates. ?Do not use any products that contain nicotine or tobacco, such   as cigarettes, e-cigarettes, and chewing tobacco. If you need help quitting, ask your doctor. ?Check your blood pressure at home as told by your doctor. ?Keep all follow-up visits as told by your doctor. This is important. ?Medicines ?Take over-the-counter and prescription medicines only as told by your doctor. Follow directions carefully. ?Do not skip doses of  blood pressure medicine. The medicine does not work as well if you skip doses. Skipping doses also puts you at risk for problems. ?Ask your doctor about side effects or reactions to medicines that you should watch for. ?Contact a doctor if you: ?Think you are having a reaction to the medicine you are taking. ?Have headaches that keep coming back (recurring). ?Feel dizzy. ?Have swelling in your ankles. ?Have trouble with your vision. ?Get help right away if you: ?Get a very bad headache. ?Start to feel mixed up (confused). ?Feel weak or numb. ?Feel faint. ?Have very bad pain in your: ?Chest. ?Belly (abdomen). ?Throw up more than once. ?Have trouble breathing. ?Summary ?Hypertension is another name for high blood pressure. ?High blood pressure forces your heart to work harder to pump blood. ?For most people, a normal blood pressure is less than 120/80. ?Making healthy choices can help lower blood pressure. If your blood pressure does not get lower with healthy choices, you may need to take medicine. ?This information is not intended to replace advice given to you by your health care provider. Make sure you discuss any questions you have with your health care provider. ?Document Revised: 06/05/2018 Document Reviewed: 06/05/2018 ?Elsevier Patient Education ? 2022 [place]. ?Diabetes Mellitus Basics ?Diabetes mellitus, or diabetes, is a long-term (chronic) disease. It occurs when the body does not properly use sugar (glucose) that is released from food after you eat. ?Diabetes mellitus may be caused by one or both of these problems: ?Your pancreas does not make enough of a hormone called insulin. ?Your body does not react in a normal way to the insulin that it makes. ?Insulin lets glucose enter cells in your body. This gives you energy. If you have diabetes, glucose cannot get into cells. This causes high blood glucose (hyperglycemia). ?How to treat and manage diabetes ?You may need to take insulin or other diabetes  medicines daily to keep your glucose in balance. If you are prescribed insulin, you will learn how to give yourself insulin by injection. You may need to adjust the amount of insulin you take based on the foods that you eat. ?You will need to check your blood glucose levels using a glucose monitor as told by your health care provider. The readings can help determine if you have low or high blood glucose. ?Generally, you should have these blood glucose levels: ?Before meals (preprandial): 80-130 mg/dL (4.4-7.2 mmol/L). ?After meals (postprandial): below 180 mg/dL (10 mmol/L). ?Hemoglobin A1c (HbA1c) level: less than 7%. ?Your health care provider will set treatment goals for you. ?Keep all follow-up visits. This is important. ?Follow these instructions at home: ?Diabetes medicines ?Take your diabetes medicines every day as told by your health care provider. List your diabetes medicines here: ?Name of medicine: ______________________________ ?Amount (dose): _______________ Time (a.m./p.m.): _______________ Notes: ___________________________________ ?Name of medicine: ______________________________ ?Amount (dose): _______________ Time (a.m./p.m.): _______________ Notes: ___________________________________ ?Name of medicine: ______________________________ ?Amount (dose): _______________ Time (a.m./p.m.): _______________ Notes: ___________________________________ ?Insulin ?If you use insulin, list the types of insulin you use here: ?Insulin type: ______________________________ ?Amount (dose): _______________ Time (a.m./p.m.): _______________Notes: ___________________________________ ?Insulin type: ______________________________ ?Amount (dose): _______________ Time (a.m./p.m.): _______________ Notes: ___________________________________ ?Insulin  type: ______________________________ ?Amount (dose): _______________ Time (a.m./p.m.): _______________ Notes: ___________________________________ ?Insulin type:  ______________________________ ?Amount (dose): _______________ Time (a.m./p.m.): _______________ Notes: ___________________________________ ?Insulin type: ______________________________ ?Amount (dose): _______________ Time

## 2022-01-10 LAB — CBC WITH DIFFERENTIAL/PLATELET
Basophils Absolute: 0 10*3/uL (ref 0.0–0.2)
Basos: 1 %
EOS (ABSOLUTE): 0.2 10*3/uL (ref 0.0–0.4)
Eos: 3 %
Hematocrit: 41.8 % (ref 34.0–46.6)
Hemoglobin: 14.1 g/dL (ref 11.1–15.9)
Immature Grans (Abs): 0 10*3/uL (ref 0.0–0.1)
Immature Granulocytes: 0 %
Lymphocytes Absolute: 1.8 10*3/uL (ref 0.7–3.1)
Lymphs: 30 %
MCH: 28.7 pg (ref 26.6–33.0)
MCHC: 33.7 g/dL (ref 31.5–35.7)
MCV: 85 fL (ref 79–97)
Monocytes Absolute: 0.5 10*3/uL (ref 0.1–0.9)
Monocytes: 9 %
Neutrophils Absolute: 3.3 10*3/uL (ref 1.4–7.0)
Neutrophils: 57 %
Platelets: 297 10*3/uL (ref 150–450)
RBC: 4.92 x10E6/uL (ref 3.77–5.28)
RDW: 13.5 % (ref 11.7–15.4)
WBC: 5.8 10*3/uL (ref 3.4–10.8)

## 2022-01-10 LAB — COMPREHENSIVE METABOLIC PANEL
ALT: 15 IU/L (ref 0–32)
AST: 14 IU/L (ref 0–40)
Albumin/Globulin Ratio: 1.7 (ref 1.2–2.2)
Albumin: 4.3 g/dL (ref 3.8–4.8)
Alkaline Phosphatase: 45 IU/L (ref 44–121)
BUN/Creatinine Ratio: 16 (ref 12–28)
BUN: 17 mg/dL (ref 8–27)
Bilirubin Total: <0.2 mg/dL (ref 0.0–1.2)
CO2: 23 mmol/L (ref 20–29)
Calcium: 10.4 mg/dL — ABNORMAL HIGH (ref 8.7–10.3)
Chloride: 106 mmol/L (ref 96–106)
Creatinine, Ser: 1.04 mg/dL — ABNORMAL HIGH (ref 0.57–1.00)
Globulin, Total: 2.5 g/dL (ref 1.5–4.5)
Glucose: 122 mg/dL — ABNORMAL HIGH (ref 70–99)
Potassium: 4.3 mmol/L (ref 3.5–5.2)
Sodium: 142 mmol/L (ref 134–144)
Total Protein: 6.8 g/dL (ref 6.0–8.5)
eGFR: 60 mL/min/{1.73_m2} (ref >59)

## 2022-01-10 LAB — LIPID PANEL
Chol/HDL Ratio: 4.6 ratio — ABNORMAL HIGH (ref 0.0–4.4)
Cholesterol, Total: 182 mg/dL (ref 100–199)
HDL: 40 mg/dL (ref >39)
LDL Chol Calc (NIH): 119 mg/dL — ABNORMAL HIGH (ref 0–99)
Triglycerides: 125 mg/dL (ref 0–149)
VLDL Cholesterol Cal: 23 mg/dL (ref 5–40)

## 2022-01-12 ENCOUNTER — Ambulatory Visit: Payer: Self-pay | Admitting: General Surgery

## 2022-01-18 DIAGNOSIS — Z1211 Encounter for screening for malignant neoplasm of colon: Secondary | ICD-10-CM | POA: Diagnosis not present

## 2022-01-18 DIAGNOSIS — Z1212 Encounter for screening for malignant neoplasm of rectum: Secondary | ICD-10-CM | POA: Diagnosis not present

## 2022-01-22 ENCOUNTER — Other Ambulatory Visit: Payer: Self-pay | Admitting: Family Medicine

## 2022-01-22 DIAGNOSIS — U071 COVID-19: Secondary | ICD-10-CM

## 2022-01-24 ENCOUNTER — Other Ambulatory Visit: Payer: Self-pay | Admitting: *Deleted

## 2022-01-24 DIAGNOSIS — E119 Type 2 diabetes mellitus without complications: Secondary | ICD-10-CM

## 2022-01-24 MED ORDER — PRAMIPEXOLE DIHYDROCHLORIDE 0.25 MG PO TABS: 0.2500 mg | ORAL_TABLET | Freq: Every day | ORAL | 1 refills | Status: DC

## 2022-01-24 MED ORDER — EMPAGLIFLOZIN 10 MG PO TABS: 10.0000 mg | ORAL_TABLET | Freq: Every day | ORAL | 1 refills | Status: DC

## 2022-01-24 MED ORDER — MONTELUKAST SODIUM 10 MG PO TABS: 10.0000 mg | ORAL_TABLET | Freq: Every evening | ORAL | 1 refills | Status: DC

## 2022-01-24 MED ORDER — GABAPENTIN 300 MG PO CAPS: 300.0000 mg | ORAL_CAPSULE | Freq: Two times a day (BID) | ORAL | 1 refills | Status: DC

## 2022-01-24 NOTE — Addendum Note (Signed)
Addended by: Antonietta Barcelona D on: 01/24/2022 08:54 AM ? ? Modules accepted: Orders ? ?

## 2022-01-25 DIAGNOSIS — R809 Proteinuria, unspecified: Secondary | ICD-10-CM | POA: Diagnosis not present

## 2022-01-25 DIAGNOSIS — I129 Hypertensive chronic kidney disease with stage 1 through stage 4 chronic kidney disease, or unspecified chronic kidney disease: Secondary | ICD-10-CM | POA: Diagnosis not present

## 2022-01-25 DIAGNOSIS — N189 Chronic kidney disease, unspecified: Secondary | ICD-10-CM | POA: Diagnosis not present

## 2022-01-25 DIAGNOSIS — E210 Primary hyperparathyroidism: Secondary | ICD-10-CM | POA: Diagnosis not present

## 2022-01-25 LAB — COLOGUARD: COLOGUARD: NEGATIVE

## 2022-01-26 ENCOUNTER — Encounter (HOSPITAL_COMMUNITY): Payer: Self-pay

## 2022-01-26 NOTE — Patient Instructions (Signed)
2 VISITORS ARE ALLOWED TO COME WITH YOU AND STAY IN THE WAITING ROOM ONLY DURING PRE OP AND PROCEDURE.   ? ?**NO VISITORS ARE ALLOWED IN THE SHORT STAY AREA OR RECOVERY ROOM!!** ? ?IF YOU WILL BE ADMITTED INTO THE HOSPITAL YOU ARE ALLOWED 4 SUPPORT PEOPLE DURING VISITATION HOURS ONLY (7 AM -8PM)   ?The support person(s) must pass our screening, gel in and out, and wear a mask at all times, including in the patient?s room. ?Patients must also wear a mask when staff or their support person are in the room. ?Visitors GUEST BADGE MUST BE WORN VISIBLY  ?One adult visitor may remain with you overnight and MUST be in the room by 8 P.M. ?  ? ? Your procedure is scheduled on: 02/09/22 ? ? Report to [facility] Main Entrance ? ?  Report to  short stay at 5:15 AM ? ? Call this number if you have problems the morning of surgery [phone] ? ? Do not eat food :After Midnight. ? ? After Midnight you may have the following liquids until _4:30_ AM/  DAY OF SURGERY ? ?Water ?Black Coffee (sugar ok, NO MILK/CREAM OR CREAMERS)  ?Tea (sugar ok, NO MILK/CREAM OR CREAMERS) regular and decaf                             ?Plain Jell-O (NO RED)                                           ?Fruit ices (not with fruit pulp, NO RED)                                     ?Popsicles (NO RED)                                                                  ?Juice: apple, WHITE grape, WHITE cranberry ?Sports drinks like Gatorade (NO RED) ?Clear broth(vegetable,chicken,beef) ? ?               ? ?  ?  ?The day of surgery:  ?Drink ONE (1)  G2 at 4:15 AM the morning of surgery. Drink in one sitting. Do not sip.  ?This drink was given to you during your hospital  ?pre-op appointment visit. ?Nothing else to drink after completing the  ? G2. At 4:30 AM ?  ?       If you have questions, please contact your surgeon?s office. ? ? ?  ?  ?Oral Hygiene is also important to reduce your risk of infection.                                    ?Remember - BRUSH  YOUR TEETH THE MORNING OF SURGERY WITH YOUR REGULAR TOOTHPASTE ? ? Do NOT smoke after Midnight ? ? Take these medicines the morning of surgery with A SIP OF WATER:  ? ?DO NOT TAKE ANY ORAL DIABETIC MEDICATIONS DAY  OF YOUR SURGERY ? ?Bring CPAP mask and tubing day of surgery. ?                  ?           You may not have any metal on your body including hair pins, jewelry, and body piercing ? ?           Do not wear make-up, lotions, powders, perfumes/cologne, or deodorant ? ?Do not wear nail polish including gel and S&S, artificial/acrylic nails, or any other type of covering on natural nails including finger and toenails. If you have artificial nails, gel coating, etc. that needs to be removed by a nail salon please have this removed prior to surgery or surgery may need to be canceled/ delayed if the surgeon/ anesthesia feels like they are unable to be safely monitored.  ? ?Do not shave  48 hours prior to surgery.  ? ?         ? ? Do not bring valuables to the hospital. [facility] NOT ?            RESPONSIBLE   FOR VALUABLES. ? ? Contacts, dentures or bridgework may not be worn into surgery. ? ? Bring small overnight bag day of surgery. ?  ? Patients discharged on the day of surgery will not be allowed to drive home.  Someone NEEDS to stay with you for the first 24 hours after anesthesia. ? ? Special Instructions: Bring a copy of your healthcare power of attorney and living will documents the day of surgery if you haven't scanned them before. ? ?            Please read over the following fact sheets you were given: IF [place] [phone] ? ?   [facility] - Preparing for Surgery ?Before surgery, you can play an important role.  Because skin is not sterile, your skin needs to be as free of germs as possible.  You can reduce the number of germs on your skin by washing with CHG (chlorahexidine gluconate) soap before surgery.  CHG is an antiseptic cleaner  which kills germs and bonds with the skin to continue killing germs even after washing. ?Please DO NOT use if you have an allergy to CHG or antibacterial soaps.  If your skin becomes reddened/irritated stop using the CHG and inform your nurse when you arrive at Short Stay. ?Do not shave (including legs and underarms) for at least 48 hours prior to the first CHG shower.   ?Please follow these instructions carefully: ? 1.  Shower with CHG Soap the night before surgery and the  morning of Surgery. ? 2.  If you choose to wash your hair, wash your hair first as usual with your  normal  shampoo. ? 3.  After you shampoo, rinse your hair and body thoroughly to remove the  shampoo.                   ?         4.  Use CHG as you would any other liquid soap.  You can apply chg directly  to the skin and wash  ?                     Gently with a scrungie or clean washcloth. ? 5.  Apply the CHG Soap to your body ONLY FROM THE NECK DOWN.  Do not use on face/ open      ?                     Wound or open sores. Avoid contact with eyes, ears mouth and genitals (private parts).  ?                     Production manager,  Genitals (private parts) with your normal soap. ?            6.  Wash thoroughly, paying special attention to the area where your surgery  will be performed. ? 7.  Thoroughly rinse your body with warm water from the neck down. ? 8.  DO NOT shower/wash with your normal soap after using and rinsing off  the CHG Soap. ?               9.  Pat yourself dry with a clean towel. ?           10.  Wear clean pajamas. ?           11.  Place clean sheets on your bed the night of your first shower and do not  sleep with pets. ?Day of Surgery : ?Do not apply any lotions/deodorants the morning of surgery.  Please wear clean clothes to the hospital/surgery center. ? ?FAILURE TO FOLLOW THESE INSTRUCTIONS MAY RESULT IN THE CANCELLATION OF YOUR SURGERY ? ? ?________________________________________________________________________  ?

## 2022-01-27 ENCOUNTER — Other Ambulatory Visit: Payer: Self-pay

## 2022-01-27 ENCOUNTER — Encounter (HOSPITAL_COMMUNITY): Payer: Self-pay

## 2022-01-27 ENCOUNTER — Encounter (HOSPITAL_COMMUNITY)
Admission: RE | Admit: 2022-01-27 | Discharge: 2022-01-27 | Disposition: A | Discharge status: Home / Self Care | Payer: BC Managed Care – PPO | Source: Ambulatory Visit | Attending: General Surgery | Admitting: General Surgery

## 2022-01-27 VITALS — BP 127/89 | HR 73 | Temp 98.5°F | Resp 18 | Ht 61.0 in | Wt 162.0 lb

## 2022-01-27 DIAGNOSIS — N1831 Chronic kidney disease, stage 3a: Secondary | ICD-10-CM | POA: Diagnosis not present

## 2022-01-27 DIAGNOSIS — Z01818 Encounter for other preprocedural examination: Secondary | ICD-10-CM | POA: Diagnosis not present

## 2022-01-27 DIAGNOSIS — E1122 Type 2 diabetes mellitus with diabetic chronic kidney disease: Secondary | ICD-10-CM | POA: Insufficient documentation

## 2022-01-27 LAB — CBC
HCT: 46.6 % — ABNORMAL HIGH (ref 36.0–46.0)
Hemoglobin: 15.1 g/dL — ABNORMAL HIGH (ref 12.0–15.0)
MCH: 28.6 pg (ref 26.0–34.0)
MCHC: 32.4 g/dL (ref 30.0–36.0)
MCV: 88.3 fL (ref 80.0–100.0)
Platelets: 337 10*3/uL (ref 150–400)
RBC: 5.28 MIL/uL — ABNORMAL HIGH (ref 3.87–5.11)
RDW: 14.2 % (ref 11.5–15.5)
WBC: 6.1 10*3/uL (ref 4.0–10.5)
nRBC: 0 % (ref 0.0–0.2)

## 2022-01-27 LAB — BASIC METABOLIC PANEL
Anion gap: 7 (ref 5–15)
BUN: 26 mg/dL — ABNORMAL HIGH (ref 8–23)
CO2: 24 mmol/L (ref 22–32)
Calcium: 11.4 mg/dL — ABNORMAL HIGH (ref 8.9–10.3)
Chloride: 109 mmol/L (ref 98–111)
Creatinine, Ser: 0.95 mg/dL (ref 0.44–1.00)
GFR, Estimated: >60 mL/min (ref >60)
Glucose, Bld: 110 mg/dL — ABNORMAL HIGH (ref 70–99)
Potassium: 4.1 mmol/L (ref 3.5–5.1)
Sodium: 140 mmol/L (ref 135–145)

## 2022-01-27 LAB — GLUCOSE, CAPILLARY: Glucose-Capillary: 122 mg/dL — ABNORMAL HIGH (ref 70–99)

## 2022-01-27 NOTE — Progress Notes (Addendum)
Anesthesia note: ? ?Bowel prep reminder:no ? ?PCP - Jac Canavan NP ?Cardiologist -no ?Other-  ? ?Chest x-ray - no ?EKG - 01/27/22 ?Stress Test - no ?ECHO - no ?Cardiac Cath - NA ? ?Pacemaker/ICD device last checked:NA ? ?Sleep Study - no ?CPAP -  ? ?Fasting Blood Sugar - 110-320 ?Checks Blood Sugar _2 times a week____ ? ?Blood Thinner:ASA 81 ?Blood Thinner Instructions: ?Aspirin Instructions:none. Pt will call PCP ?Last Dose: ? ?Anesthesia review: yes ? ?Patient denies shortness of breath, fever, cough and chest pain at PAT appointment ?Pt has no SOB with any activities. Her gait is unsteady due to the stroke. ? ?Patient verbalized understanding of instructions that were given to them at the PAT appointment. Patient was also instructed that they will need to review over the PAT instructions again at home before surgery. yes ?

## 2022-02-09 ENCOUNTER — Encounter (HOSPITAL_COMMUNITY)
Admission: RE | Disposition: A | Discharge status: Home / Self Care | Payer: Self-pay | Source: Ambulatory Visit | Attending: General Surgery

## 2022-02-09 ENCOUNTER — Observation Stay (HOSPITAL_COMMUNITY)
Admission: RE | Admit: 2022-02-09 | Discharge: 2022-02-10 | Disposition: A | Discharge status: Home / Self Care | Payer: BC Managed Care – PPO | Source: Ambulatory Visit | Attending: General Surgery | Admitting: General Surgery

## 2022-02-09 ENCOUNTER — Encounter (HOSPITAL_COMMUNITY): Payer: Self-pay | Admitting: General Surgery

## 2022-02-09 ENCOUNTER — Other Ambulatory Visit: Payer: Self-pay

## 2022-02-09 ENCOUNTER — Ambulatory Visit (HOSPITAL_COMMUNITY): Payer: BC Managed Care – PPO | Admitting: Physician Assistant

## 2022-02-09 ENCOUNTER — Ambulatory Visit (HOSPITAL_COMMUNITY): Payer: BC Managed Care – PPO | Admitting: Anesthesiology

## 2022-02-09 DIAGNOSIS — J45909 Unspecified asthma, uncomplicated: Secondary | ICD-10-CM | POA: Insufficient documentation

## 2022-02-09 DIAGNOSIS — I10 Essential (primary) hypertension: Secondary | ICD-10-CM | POA: Insufficient documentation

## 2022-02-09 DIAGNOSIS — Z79899 Other long term (current) drug therapy: Secondary | ICD-10-CM | POA: Insufficient documentation

## 2022-02-09 DIAGNOSIS — Z7982 Long term (current) use of aspirin: Secondary | ICD-10-CM | POA: Diagnosis not present

## 2022-02-09 DIAGNOSIS — D351 Benign neoplasm of parathyroid gland: Secondary | ICD-10-CM | POA: Diagnosis not present

## 2022-02-09 DIAGNOSIS — Z96651 Presence of right artificial knee joint: Secondary | ICD-10-CM | POA: Diagnosis not present

## 2022-02-09 DIAGNOSIS — I252 Old myocardial infarction: Secondary | ICD-10-CM | POA: Diagnosis not present

## 2022-02-09 DIAGNOSIS — E213 Hyperparathyroidism, unspecified: Secondary | ICD-10-CM | POA: Diagnosis not present

## 2022-02-09 DIAGNOSIS — E210 Primary hyperparathyroidism: Secondary | ICD-10-CM | POA: Diagnosis not present

## 2022-02-09 DIAGNOSIS — E119 Type 2 diabetes mellitus without complications: Secondary | ICD-10-CM | POA: Insufficient documentation

## 2022-02-09 DIAGNOSIS — Z7984 Long term (current) use of oral hypoglycemic drugs: Secondary | ICD-10-CM | POA: Insufficient documentation

## 2022-02-09 DIAGNOSIS — Z87891 Personal history of nicotine dependence: Secondary | ICD-10-CM | POA: Insufficient documentation

## 2022-02-09 HISTORY — PX: PARATHYROIDECTOMY: SHX19

## 2022-02-09 LAB — GLUCOSE, CAPILLARY
Glucose-Capillary: 130 mg/dL — ABNORMAL HIGH (ref 70–99)
Glucose-Capillary: 130 mg/dL — ABNORMAL HIGH (ref 70–99)
Glucose-Capillary: 225 mg/dL — ABNORMAL HIGH (ref 70–99)
Glucose-Capillary: 162 mg/dL — ABNORMAL HIGH (ref 70–99)
Glucose-Capillary: 196 mg/dL — ABNORMAL HIGH (ref 70–99)

## 2022-02-09 LAB — HEMOGLOBIN A1C
Hgb A1c MFr Bld: 6.7 % — ABNORMAL HIGH (ref 4.8–5.6)
Mean Plasma Glucose: 145.59 mg/dL

## 2022-02-09 SURGERY — PARATHYROIDECTOMY
Anesthesia: General | Laterality: Right

## 2022-02-09 MED ORDER — METOPROLOL TARTRATE 5 MG/5ML IV SOLN: 5.0000 mg | Freq: Four times a day (QID) | INTRAVENOUS | Status: DC | PRN

## 2022-02-09 MED ORDER — CHLORHEXIDINE GLUCONATE CLOTH 2 % EX PADS: 6.0000 | MEDICATED_PAD | Freq: Once | CUTANEOUS | Status: DC

## 2022-02-09 MED ORDER — PROPOFOL 10 MG/ML IV BOLUS
INTRAVENOUS | Status: AC
  Filled 2022-02-09: qty 20

## 2022-02-09 MED ORDER — ALBUTEROL SULFATE HFA 108 (90 BASE) MCG/ACT IN AERS: 2.0000 | INHALATION_SPRAY | Freq: Four times a day (QID) | RESPIRATORY_TRACT | Status: DC | PRN

## 2022-02-09 MED ORDER — LIDOCAINE HCL (PF) 2 % IJ SOLN
INTRAMUSCULAR | Status: AC
  Filled 2022-02-09: qty 5

## 2022-02-09 MED ORDER — 0.9 % SODIUM CHLORIDE (POUR BTL) OPTIME
TOPICAL | Status: DC | PRN
  Administered 2022-02-09: 1000 mL

## 2022-02-09 MED ORDER — ONDANSETRON HCL 4 MG/2ML IJ SOLN: 4.0000 mg | Freq: Four times a day (QID) | INTRAMUSCULAR | Status: DC | PRN

## 2022-02-09 MED ORDER — LACTATED RINGERS IV SOLN: INTRAVENOUS | Status: DC

## 2022-02-09 MED ORDER — IBUPROFEN 200 MG PO TABS
200.0000 mg | ORAL_TABLET | Freq: Four times a day (QID) | ORAL | Status: DC | PRN
  Administered 2022-02-09: 200 mg via ORAL
  Filled 2022-02-09: qty 1

## 2022-02-09 MED ORDER — ORAL CARE MOUTH RINSE: 15.0000 mL | Freq: Once | OROMUCOSAL | Status: AC

## 2022-02-09 MED ORDER — OXYCODONE HCL 5 MG/5ML PO SOLN: 5.0000 mg | Freq: Once | ORAL | Status: DC | PRN

## 2022-02-09 MED ORDER — APREPITANT 40 MG PO CAPS
ORAL_CAPSULE | ORAL | Status: AC
  Filled 2022-02-09: qty 1

## 2022-02-09 MED ORDER — SUGAMMADEX SODIUM 200 MG/2ML IV SOLN
INTRAVENOUS | Status: DC | PRN
  Administered 2022-02-09: 175 mg via INTRAVENOUS

## 2022-02-09 MED ORDER — PHENYLEPHRINE 80 MCG/ML (10ML) SYRINGE FOR IV PUSH (FOR BLOOD PRESSURE SUPPORT)
PREFILLED_SYRINGE | INTRAVENOUS | Status: AC
  Filled 2022-02-09: qty 10

## 2022-02-09 MED ORDER — HEMOSTATIC AGENTS (NO CHARGE) OPTIME
TOPICAL | Status: DC | PRN
  Administered 2022-02-09: 1

## 2022-02-09 MED ORDER — VITAMIN D 25 MCG (1000 UNIT) PO TABS
5000.0000 [IU] | ORAL_TABLET | Freq: Every day | ORAL | Status: DC
  Administered 2022-02-09 – 2022-02-10 (×2): 5000 [IU] via ORAL
  Filled 2022-02-09 (×2): qty 5

## 2022-02-09 MED ORDER — LISINOPRIL 20 MG PO TABS
20.0000 mg | ORAL_TABLET | Freq: Once | ORAL | Status: AC
  Administered 2022-02-09: 20 mg via ORAL
  Filled 2022-02-09: qty 1

## 2022-02-09 MED ORDER — FENOFIBRATE 160 MG PO TABS
160.0000 mg | ORAL_TABLET | Freq: Every day | ORAL | Status: DC
Start: 2022-02-09 — End: 2022-02-10
  Administered 2022-02-09 – 2022-02-10 (×2): 160 mg via ORAL
  Filled 2022-02-09 (×2): qty 1

## 2022-02-09 MED ORDER — OXYCODONE HCL 5 MG PO TABS: 5.0000 mg | ORAL_TABLET | Freq: Once | ORAL | Status: DC | PRN

## 2022-02-09 MED ORDER — MIDAZOLAM HCL 5 MG/5ML IJ SOLN
INTRAMUSCULAR | Status: DC | PRN
  Administered 2022-02-09: 2 mg via INTRAVENOUS

## 2022-02-09 MED ORDER — MORPHINE SULFATE (PF) 2 MG/ML IV SOLN: 2.0000 mg | INTRAVENOUS | Status: DC | PRN

## 2022-02-09 MED ORDER — DIPHENHYDRAMINE HCL 12.5 MG/5ML PO ELIX: 12.5000 mg | ORAL_SOLUTION | Freq: Four times a day (QID) | ORAL | Status: DC | PRN

## 2022-02-09 MED ORDER — EMPAGLIFLOZIN 10 MG PO TABS
10.0000 mg | ORAL_TABLET | Freq: Every day | ORAL | Status: DC
  Administered 2022-02-09 – 2022-02-10 (×2): 10 mg via ORAL
  Filled 2022-02-09 (×2): qty 1

## 2022-02-09 MED ORDER — FENTANYL CITRATE (PF) 100 MCG/2ML IJ SOLN
INTRAMUSCULAR | Status: AC
  Filled 2022-02-09: qty 2

## 2022-02-09 MED ORDER — DIPHENHYDRAMINE HCL 50 MG/ML IJ SOLN
INTRAMUSCULAR | Status: AC
  Filled 2022-02-09: qty 1

## 2022-02-09 MED ORDER — DIPHENHYDRAMINE HCL 50 MG/ML IJ SOLN: 12.5000 mg | Freq: Four times a day (QID) | INTRAMUSCULAR | Status: DC | PRN

## 2022-02-09 MED ORDER — KETOCONAZOLE 2 % EX SHAM: 1.0000 "application " | MEDICATED_SHAMPOO | CUTANEOUS | Status: DC

## 2022-02-09 MED ORDER — SCOPOLAMINE 1 MG/3DAYS TD PT72
MEDICATED_PATCH | TRANSDERMAL | Status: AC
Start: 2022-02-09 — End: 2022-02-09
  Filled 2022-02-09: qty 1

## 2022-02-09 MED ORDER — MIDAZOLAM HCL 2 MG/2ML IJ SOLN
INTRAMUSCULAR | Status: AC
  Filled 2022-02-09: qty 2

## 2022-02-09 MED ORDER — PROPOFOL 500 MG/50ML IV EMUL
INTRAVENOUS | Status: DC | PRN
  Administered 2022-02-09: 200 ug/kg/min via INTRAVENOUS

## 2022-02-09 MED ORDER — PROPOFOL 10 MG/ML IV BOLUS
INTRAVENOUS | Status: DC | PRN
  Administered 2022-02-09: 100 mg via INTRAVENOUS

## 2022-02-09 MED ORDER — SCOPOLAMINE 1 MG/3DAYS TD PT72
MEDICATED_PATCH | TRANSDERMAL | Status: DC | PRN
  Administered 2022-02-09: 1 via TRANSDERMAL

## 2022-02-09 MED ORDER — AMISULPRIDE (ANTIEMETIC) 5 MG/2ML IV SOLN: 10.0000 mg | Freq: Once | INTRAVENOUS | Status: DC | PRN

## 2022-02-09 MED ORDER — DIPHENHYDRAMINE HCL 50 MG/ML IJ SOLN
INTRAMUSCULAR | Status: DC | PRN
  Administered 2022-02-09: 25 mg via INTRAVENOUS

## 2022-02-09 MED ORDER — ONDANSETRON HCL 4 MG/2ML IJ SOLN: 4.0000 mg | Freq: Once | INTRAMUSCULAR | Status: DC | PRN

## 2022-02-09 MED ORDER — INSULIN ASPART 100 UNIT/ML IJ SOLN: 0.0000 [IU] | INTRAMUSCULAR | Status: DC

## 2022-02-09 MED ORDER — HYDROMORPHONE HCL 1 MG/ML IJ SOLN
0.2500 mg | INTRAMUSCULAR | Status: DC | PRN
  Administered 2022-02-09 (×2): 0.5 mg via INTRAVENOUS

## 2022-02-09 MED ORDER — SODIUM CHLORIDE 0.9 % IV SOLN: INTRAVENOUS | Status: DC

## 2022-02-09 MED ORDER — ONDANSETRON HCL 4 MG/2ML IJ SOLN
INTRAMUSCULAR | Status: DC | PRN
  Administered 2022-02-09: 4 mg via INTRAVENOUS

## 2022-02-09 MED ORDER — GABAPENTIN 300 MG PO CAPS
300.0000 mg | ORAL_CAPSULE | Freq: Two times a day (BID) | ORAL | Status: DC
Start: 2022-02-09 — End: 2022-02-10
  Administered 2022-02-09 – 2022-02-10 (×3): 300 mg via ORAL
  Filled 2022-02-09 (×3): qty 1

## 2022-02-09 MED ORDER — BUPIVACAINE-EPINEPHRINE 0.25% -1:200000 IJ SOLN
INTRAMUSCULAR | Status: DC | PRN
  Administered 2022-02-09: 10 mL

## 2022-02-09 MED ORDER — ACETAMINOPHEN 500 MG PO TABS
1000.0000 mg | ORAL_TABLET | Freq: Four times a day (QID) | ORAL | Status: DC
  Administered 2022-02-09 – 2022-02-10 (×3): 1000 mg via ORAL
  Filled 2022-02-09 (×3): qty 2

## 2022-02-09 MED ORDER — LISINOPRIL 10 MG PO TABS: 10.0000 mg | ORAL_TABLET | Freq: Every day | ORAL | Status: DC

## 2022-02-09 MED ORDER — POLYETHYLENE GLYCOL 3350 17 G PO PACK
17.0000 g | PACK | Freq: Every day | ORAL | Status: DC
  Administered 2022-02-09: 17 g via ORAL
  Filled 2022-02-09 (×2): qty 1

## 2022-02-09 MED ORDER — FERROUS SULFATE 325 (65 FE) MG PO TABS
325.0000 mg | ORAL_TABLET | Freq: Every morning | ORAL | Status: DC
  Administered 2022-02-09 – 2022-02-10 (×2): 325 mg via ORAL
  Filled 2022-02-09 (×2): qty 1

## 2022-02-09 MED ORDER — ESCITALOPRAM OXALATE 20 MG PO TABS
20.0000 mg | ORAL_TABLET | Freq: Every day | ORAL | Status: DC
  Administered 2022-02-09 – 2022-02-10 (×2): 20 mg via ORAL
  Filled 2022-02-09 (×2): qty 1

## 2022-02-09 MED ORDER — APREPITANT 40 MG PO CAPS
40.0000 mg | ORAL_CAPSULE | Freq: Once | ORAL | Status: AC
  Administered 2022-02-09: 40 mg via ORAL

## 2022-02-09 MED ORDER — MECLIZINE HCL 25 MG PO TABS: 25.0000 mg | ORAL_TABLET | Freq: Three times a day (TID) | ORAL | Status: DC | PRN

## 2022-02-09 MED ORDER — OXYCODONE HCL 5 MG PO TABS
5.0000 mg | ORAL_TABLET | ORAL | Status: DC | PRN
  Administered 2022-02-10: 5 mg via ORAL
  Filled 2022-02-09 (×2): qty 1

## 2022-02-09 MED ORDER — CEFAZOLIN SODIUM-DEXTROSE 2-4 GM/100ML-% IV SOLN
2.0000 g | INTRAVENOUS | Status: AC
  Administered 2022-02-09: 2 g via INTRAVENOUS
  Filled 2022-02-09: qty 100

## 2022-02-09 MED ORDER — FENTANYL CITRATE (PF) 250 MCG/5ML IJ SOLN
INTRAMUSCULAR | Status: DC | PRN
  Administered 2022-02-09 (×2): 50 ug via INTRAVENOUS

## 2022-02-09 MED ORDER — ENSURE PRE-SURGERY PO LIQD
296.0000 mL | Freq: Once | ORAL | Status: DC
  Filled 2022-02-09: qty 296

## 2022-02-09 MED ORDER — ALENDRONATE SODIUM 70 MG PO TABS: 70.0000 mg | ORAL_TABLET | ORAL | Status: DC

## 2022-02-09 MED ORDER — PHENYLEPHRINE HCL (PRESSORS) 10 MG/ML IV SOLN
INTRAVENOUS | Status: DC | PRN
  Administered 2022-02-09: 160 ug via INTRAVENOUS
  Administered 2022-02-09: 80 ug via INTRAVENOUS

## 2022-02-09 MED ORDER — ACETAMINOPHEN 500 MG PO TABS
1000.0000 mg | ORAL_TABLET | ORAL | Status: AC
  Administered 2022-02-09: 1000 mg via ORAL
  Filled 2022-02-09: qty 2

## 2022-02-09 MED ORDER — LISINOPRIL 20 MG PO TABS
20.0000 mg | ORAL_TABLET | Freq: Every day | ORAL | Status: DC
  Administered 2022-02-10: 20 mg via ORAL
  Filled 2022-02-09 (×2): qty 1

## 2022-02-09 MED ORDER — ONDANSETRON HCL 4 MG PO TABS: 4.0000 mg | ORAL_TABLET | Freq: Three times a day (TID) | ORAL | Status: DC | PRN

## 2022-02-09 MED ORDER — CHLORHEXIDINE GLUCONATE 0.12 % MT SOLN
15.0000 mL | Freq: Once | OROMUCOSAL | Status: AC
  Administered 2022-02-09: 15 mL via OROMUCOSAL

## 2022-02-09 MED ORDER — DEXAMETHASONE SODIUM PHOSPHATE 10 MG/ML IJ SOLN
INTRAMUSCULAR | Status: DC | PRN
  Administered 2022-02-09: 10 mg via INTRAVENOUS

## 2022-02-09 MED ORDER — ONDANSETRON HCL 4 MG/2ML IJ SOLN
INTRAMUSCULAR | Status: AC
  Filled 2022-02-09: qty 2

## 2022-02-09 MED ORDER — ROCURONIUM BROMIDE 10 MG/ML (PF) SYRINGE
PREFILLED_SYRINGE | INTRAVENOUS | Status: DC | PRN
  Administered 2022-02-09: 70 mg via INTRAVENOUS

## 2022-02-09 MED ORDER — BUPIVACAINE-EPINEPHRINE (PF) 0.25% -1:200000 IJ SOLN
INTRAMUSCULAR | Status: AC
  Filled 2022-02-09: qty 30

## 2022-02-09 MED ORDER — ALBUTEROL SULFATE (2.5 MG/3ML) 0.083% IN NEBU: 2.5000 mg | INHALATION_SOLUTION | Freq: Four times a day (QID) | RESPIRATORY_TRACT | Status: DC | PRN

## 2022-02-09 MED ORDER — DEXAMETHASONE SODIUM PHOSPHATE 10 MG/ML IJ SOLN
INTRAMUSCULAR | Status: AC
  Filled 2022-02-09: qty 1

## 2022-02-09 MED ORDER — MONTELUKAST SODIUM 10 MG PO TABS
10.0000 mg | ORAL_TABLET | Freq: Every evening | ORAL | Status: DC
  Administered 2022-02-09: 10 mg via ORAL
  Filled 2022-02-09: qty 1

## 2022-02-09 MED ORDER — PRAMIPEXOLE DIHYDROCHLORIDE 0.25 MG PO TABS
0.2500 mg | ORAL_TABLET | Freq: Every day | ORAL | Status: DC
  Administered 2022-02-10: 0.25 mg via ORAL
  Filled 2022-02-09: qty 1

## 2022-02-09 MED ORDER — ROCURONIUM BROMIDE 10 MG/ML (PF) SYRINGE
PREFILLED_SYRINGE | INTRAVENOUS | Status: AC
  Filled 2022-02-09: qty 10

## 2022-02-09 MED ORDER — HYDROMORPHONE HCL 1 MG/ML IJ SOLN
INTRAMUSCULAR | Status: AC
  Filled 2022-02-09: qty 1

## 2022-02-09 MED ORDER — LORATADINE 10 MG PO TABS
10.0000 mg | ORAL_TABLET | Freq: Every day | ORAL | Status: DC
  Administered 2022-02-09 – 2022-02-10 (×2): 10 mg via ORAL
  Filled 2022-02-09 (×2): qty 1

## 2022-02-09 MED ORDER — AMISULPRIDE (ANTIEMETIC) 5 MG/2ML IV SOLN
INTRAVENOUS | Status: DC | PRN
Start: 2022-02-09 — End: 2022-02-09
  Administered 2022-02-09: 5 mg via INTRAVENOUS

## 2022-02-09 MED ORDER — ONDANSETRON 4 MG PO TBDP: 4.0000 mg | ORAL_TABLET | Freq: Four times a day (QID) | ORAL | Status: DC | PRN

## 2022-02-09 MED ORDER — AMISULPRIDE (ANTIEMETIC) 5 MG/2ML IV SOLN
INTRAVENOUS | Status: AC
  Filled 2022-02-09: qty 2

## 2022-02-09 MED ORDER — LIDOCAINE 2% (20 MG/ML) 5 ML SYRINGE
INTRAMUSCULAR | Status: DC | PRN
  Administered 2022-02-09: 60 mg via INTRAVENOUS

## 2022-02-09 SURGICAL SUPPLY — 42 items
ATTRACTOMAT 16X20 MAGNETIC DRP (DRAPES) ×2 IMPLANT
BAG COUNTER SPONGE SURGICOUNT (BAG) IMPLANT
BLADE SURG 15 STRL LF DISP TIS (BLADE) ×1 IMPLANT
BLADE SURG 15 STRL SS (BLADE) ×2
CHLORAPREP W/TINT 26 (MISCELLANEOUS) ×2 IMPLANT
CLIP TI MEDIUM 6 (CLIP) ×3 IMPLANT
CLIP TI WIDE RED SMALL 6 (CLIP) ×3 IMPLANT
COVER SURGICAL LIGHT HANDLE (MISCELLANEOUS) ×2 IMPLANT
DERMABOND ADVANCED (GAUZE/BANDAGES/DRESSINGS) ×1
DERMABOND ADVANCED .7 DNX12 (GAUZE/BANDAGES/DRESSINGS) ×1 IMPLANT
DISSECTOR SURG LIGASURE 21 (MISCELLANEOUS) ×1 IMPLANT
DRAPE LAPAROTOMY T 98X78 PEDS (DRAPES) ×2 IMPLANT
ELECT REM PT RETURN 15FT ADLT (MISCELLANEOUS) ×2 IMPLANT
GAUZE 4X4 16PLY ~~LOC~~+RFID DBL (SPONGE) ×2 IMPLANT
GLOVE BIOGEL PI IND STRL 7.0 (GLOVE) ×1 IMPLANT
GLOVE BIOGEL PI INDICATOR 7.0 (GLOVE) ×1
GLOVE SURG SS PI 7.0 STRL IVOR (GLOVE) ×2 IMPLANT
GOWN STRL REUS W/ TWL LRG LVL3 (GOWN DISPOSABLE) ×1 IMPLANT
GOWN STRL REUS W/ TWL XL LVL3 (GOWN DISPOSABLE) IMPLANT
GOWN STRL REUS W/TWL LRG LVL3 (GOWN DISPOSABLE) ×2
GOWN STRL REUS W/TWL XL LVL3 (GOWN DISPOSABLE)
HEMOSTAT SNOW SURGICEL 2X4 (HEMOSTASIS) ×1 IMPLANT
HEMOSTAT SURGICEL 2X4 FIBR (HEMOSTASIS) ×1 IMPLANT
KIT BASIN OR (CUSTOM PROCEDURE TRAY) ×2 IMPLANT
KIT TURNOVER KIT A (KITS) IMPLANT
NDL HYPO 25X1 1.5 SAFETY (NEEDLE) ×1 IMPLANT
NEEDLE HYPO 25X1 1.5 SAFETY (NEEDLE) ×2 IMPLANT
NS IRRIG 1000ML POUR BTL (IV SOLUTION) ×1 IMPLANT
PACK BASIC VI WITH GOWN DISP (CUSTOM PROCEDURE TRAY) ×2 IMPLANT
PENCIL SMOKE EVACUATOR (MISCELLANEOUS) ×1 IMPLANT
STAPLER VISISTAT 35W (STAPLE) IMPLANT
SUT MNCRL AB 4-0 PS2 18 (SUTURE) ×2 IMPLANT
SUT SILK 2 0 (SUTURE)
SUT SILK 2-0 18XBRD TIE 12 (SUTURE) IMPLANT
SUT SILK 3 0 (SUTURE)
SUT SILK 3-0 18XBRD TIE 12 (SUTURE) IMPLANT
SUT VIC AB 3-0 SH 18 (SUTURE) ×2 IMPLANT
SYR BULB IRRIG 60ML STRL (SYRINGE) ×2 IMPLANT
SYR CONTROL 10ML LL (SYRINGE) ×2 IMPLANT
TOWEL OR 17X26 10 PK STRL BLUE (TOWEL DISPOSABLE) ×2 IMPLANT
TOWEL OR NON WOVEN STRL DISP B (DISPOSABLE) ×2 IMPLANT
TUBING CONNECTING 10 (TUBING) ×2 IMPLANT

## 2022-02-09 NOTE — Anesthesia Procedure Notes (Signed)
Procedure Name: Intubation ?Date/Time: 02/09/2022 7:33 AM ?Performed by: Adelaine Roppolo D, CRNA ?Pre-anesthesia Checklist: Patient identified, Emergency Drugs available, Suction available and Patient being monitored ?Patient Re-evaluated:Patient Re-evaluated prior to induction ?Oxygen Delivery Method: Circle system utilized ?Preoxygenation: Pre-oxygenation with 100% oxygen ?Induction Type: IV induction ?Ventilation: Mask ventilation without difficulty ?Laryngoscope Size: Mac and 3 ?Grade View: Grade I ?Tube type: Oral ?Tube size: 7.0 mm ?Number of attempts: 1 ?Airway Equipment and Method: Stylet ?Placement Confirmation: ETT inserted through vocal cords under direct vision, positive ETCO2 and breath sounds checked- equal and bilateral ?Tube secured with: Tape ?Dental Injury: Teeth and Oropharynx as per pre-operative assessment  ? ? ? ? ?

## 2022-02-09 NOTE — Op Note (Signed)
Preoperative diagnosis: primary hyperparathyroidism ? ?Postoperative diagnosis: same  ? ?Procedure: right superior parathyroid removal ? ?Surgeon: Gurney Maxin, M.D. ? ?Asst: Ralene Ok, M.D. ? ?Anesthesia: general ? ?Indications for procedure: Teresa Soto is a 66 y.o. year old female with hypercalcemia, high PTH and localization to the right superior parathyroid. She also had a history of left inferior parathyroid removal for similar issues.. ? ?Description of procedure: The patient was brought to the operating room and placed in a supine position on the operating room table. Following administration of general anesthesia, the patient was positioned and then prepped and draped in the usual aseptic fashion.  ? ?A small Kocher incision was made. Dissection was carried through subcutaneous tissues and platysma. Hemostasis was achieved with the electrocautery. Skin flaps were elevated cephalad and caudad from the thyroid notch to the sternal notch. A [place] self-retaining retractor was placed for exposure. Strap muscles were incised in the midline and dissection was begun on the right side.  Strap muscles were reflected laterally. The right lobe was gently mobilized with blunt dissection. The thyroid lobe was rolled anteriorly. A darker yellow tissue was seen near the carotid sheath and freed from surrounding tissue. A medium clip was placed on the blood supply and ligasure was used to divide the area.  Dry pack was placed in the left neck. The specimen was sent for frozen section which came back as hypercellular parathyroid ? ?The neck was irrigated with warm saline. Fibrillar was placed throughout the operative field. Strap muscles were approximated in the midline with interrupted 3-0 Vicryl sutures. Platysma was closed with interrupted 3-0 Vicryl sutures. Skin was closed with a running 4-0 Monocryl subcuticular suture. Wound was washed and Dermabond was applied. The patient was awakened from anesthesia  and brought to the recovery room. The patient tolerated the procedure well. ? ?Findings: enlarged right superior parathyroid ? ?Specimen: right superior parathyroid ? ?Implant: fibrillar  ? ?Blood loss: 30 ml ? ?Local anesthesia:  10 ml marcaine  ? ?Complications: none ? ?Gurney Maxin, M.D. ?General, Bariatric, & Minimally Invasive Surgery ?[facility], [place] ? ?

## 2022-02-09 NOTE — Anesthesia Preprocedure Evaluation (Addendum)
Anesthesia Evaluation  ?Patient identified by MRN, date of birth, ID band ?Patient awake ? ? ? ?Reviewed: ?Allergy & Precautions, NPO status , Patient's Chart, lab work & pertinent test results ? ?History of Anesthesia Complications ?(+) PONV and history of anesthetic complications ? ?Airway ?Mallampati: IV ? ?TM Distance: >3 FB ?Neck ROM: Full ? ? ? Dental ? ?(+) Dental Advisory Given, Teeth Intact ?  ?Pulmonary ?shortness of breath and with exertion, asthma , former smoker,  ?Quit smoking 1984, 12 pack year history  ?Snores at night, has never been tested for sleep apnea  ?  ?Pulmonary exam normal ?breath sounds clear to auscultation ? ? ? ? ? ? Cardiovascular ?hypertension (121/82 in preop), Pt. on medications ?Normal cardiovascular exam ?Rhythm:Regular Rate:Normal ? ? ?  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 GI/Hepatic ?negative GI ROS, Neg liver ROS,   ?Endo/Other  ?diabetes, Well Controlled, Type 2, Oral Hypoglycemic AgentsParathyroid adenoma  ?a1c 6.6, FS 130 in preop  ? Renal/GU ?negative Renal ROS  ?negative genitourinary ?  ?Musculoskeletal ? ?(+) Arthritis , Osteoarthritis,   ? Abdominal ?  ?Peds ?negative pediatric ROS ?(+)  Hematology ?negative hematology ROS ?(+) Hb 15.1   ?Anesthesia Other Findings ?Uses cane when out and about  ? Reproductive/Obstetrics ?negative OB ROS ? ?  ? ? ? ? ? ? ? ? ? ? ? ? ? ?  ?  ? ? ? ? ? ? ?Anesthesia Physical ?Anesthesia Plan ? ?ASA: 2 ? ?Anesthesia Plan: General  ? ?Post-op Pain Management: Tylenol PO (pre-op)*  ? ?Induction: Intravenous ? ?PONV Risk Score and Plan: 4 or greater and Ondansetron, Dexamethasone, Midazolam, Treatment may vary due to age or medical condition, Scopolamine patch - Pre-op, Propofol infusion and TIVA ? ?Airway Management Planned: Oral ETT ? ?Additional Equipment: None ? ?Intra-op Plan:  ? ?Post-operative Plan: Extubation in  OR ? ?Informed Consent: I have reviewed the patients History and Physical, chart, labs and discussed the procedure including the risks, benefits and alternatives for the proposed anesthesia with the patient or authorized representative who has indicated his/her understanding and acceptance.  ? ? ? ?Dental advisory given ? ?Plan Discussed with: CRNA ? ?Anesthesia Plan Comments:   ? ? ? ? ? ?Anesthesia Quick Evaluation ? ?

## 2022-02-09 NOTE — Anesthesia Postprocedure Evaluation (Signed)
Anesthesia Post Note ? ?Patient: Teresa Soto ? ?Procedure(s) Performed: PARATHYROIDECTOMY (Right) ? ?  ? ?Patient location during evaluation: PACU ?Anesthesia Type: General ?Level of consciousness: awake and alert, oriented and patient cooperative ?Pain management: pain level controlled ?Vital Signs Assessment: post-procedure vital signs reviewed and stable ?Respiratory status: spontaneous breathing, nonlabored ventilation and respiratory function stable ?Cardiovascular status: blood pressure returned to baseline and stable ?Postop Assessment: no apparent nausea or vomiting ?Anesthetic complications: no ?Comments: 150s-160s SBP in PACU, denies pain or nausea. Home lisinopril dose given prior to D/C from PACU ? ? ?No notable events documented. ? ?Last Vitals:  ?Vitals:  ? 02/09/22 0945 02/09/22 0948  ?BP: (!) 172/90   ?Pulse: 71 70  ?Resp: 13 12  ?Temp:    ?SpO2: 93% 90%  ?  ?Last Pain:  ?Vitals:  ? 02/09/22 0930  ?TempSrc:   ?PainSc: 0-No pain  ? ? ?  ?  ?  ?  ?  ?  ? ?Jarome Matin Mintie Witherington ? ? ? ? ?

## 2022-02-09 NOTE — Transfer of Care (Signed)
Immediate Anesthesia Transfer of Care Note ? ?Patient: Teresa Soto ? ?Procedure(s) Performed: PARATHYROIDECTOMY (Right) ? ?Patient Location: PACU ? ?Anesthesia Type:General ? ?Level of Consciousness: awake, alert  and oriented ? ?Airway & Oxygen Therapy: Patient Spontanous Breathing and Patient connected to face mask oxygen ? ?Post-op Assessment: Report given to RN and Post -op Vital signs reviewed and stable ? ?Post vital signs: Reviewed and stable ? ?Last Vitals:  ?Vitals Value Taken Time  ?BP 135/75 02/09/22 0905  ?Temp 36.3 ?C 02/09/22 0905  ?Pulse 81 02/09/22 0907  ?Resp 17 02/09/22 0907  ?SpO2 96 % 02/09/22 0907  ?Vitals shown include unvalidated device data. ? ?Last Pain:  ?Vitals:  ? 02/09/22 0557  ?TempSrc:   ?PainSc: 0-No pain  ?   ? ?  ? ?Complications: No notable events documented. ?

## 2022-02-09 NOTE — H&P (Signed)
Teresa Soto is an 66 y.o. female.   ?Chief Complaint: hyperparathyroidism ?HPI: 66 yo female with history of left inferior parathyroid adenoma presented with hypercalcemia and was found to have localization on the right superior aspect. ? ?Past Medical History:  ?Diagnosis Date  ? Arthritis of knee, right 08/12/2014  ? Asthma   ? Phreesia 06/16/2020  ? Chronic back pain   ? Chronic constipation   ? Depression   ? Diabetes mellitus without complication ([place])   ? [employer]rees[employer] 06/16/2020  ? History of bronchitis   ? History of urinary tract infection   ? Hyperlipidemia   ? [employer]reesia 06/16/2020  ? Hypertension   ? Meniere disease   ? vertigo  ? Neuropathic pain   ? Osteoporosis   ? Phreesia 06/16/2020  ? Parathyroid adenoma   ? Poor fine motor skills   ? secondary to CVA per right side   ? PTSD (post-traumatic stress disorder)   ? Restless legs syndrome 2007 approx  ? Seasonal allergies   ? Shortness of breath dyspnea   ? coldness   ? Stroke [facility]) 2015  ? ? ?Past Surgical History:  ?Procedure Laterality Date  ? BACK SURGERY  2012  ? L1-L2  x2  ? CESAREAN SECTION    ? x 2  ? PARATHYROIDECTOMY N/A 06/27/2016  ? Procedure: MINIMALLY INVASIVE PARATHYROIDECTOMY;  Surgeon: Greer Pickerel, MD;  Location: [facility];  Service: General;  Laterality: N/A;  ? SHOULDER ARTHROSCOPY W/ ROTATOR CUFF REPAIR  2010  ? to right shoulder  ? TONSILLECTOMY    ? TOTAL KNEE ARTHROPLASTY Right 08/12/2014  ? Procedure: RIGHT TOTAL KNEE ARTHROPLASTY;  Surgeon: Carole Civil, MD;  Location: [facility];  Service: Orthopedics;  Laterality: Right;  ? TUBAL LIGATION  1999  ? ? ?Family History  ?Problem Relation Age of Onset  ? Hypertension Mother   ? Hyperlipidemia Mother   ? Dementia Paternal Grandmother   ? ?Social History:  reports that she quit smoking about 39 years ago. Her smoking use included cigarettes. She has a 12.00 pack-year smoking history. She has never used smokeless tobacco. She reports current alcohol use. She reports that she does not use  drugs. ? ?Allergies:  ?Allergies  ?Allergen Reactions  ? Fluticasone   ?  headache  ? Pneumococcal Vaccine Other (See Comments)  ?  Had rash, given both flu and PNA at same time   ? Pneumococcal Vaccines   ?  Had rash, given both flu and PNA at same time   ? Statins Other (See Comments)  ? Influenza Vaccines Rash  ? Other Nausea And Vomiting  ?  Peppers ?  ? ? ?Medications Prior to Admission  ?Medication Sig Dispense Refill  ? alendronate (FOSAMAX) 70 MG tablet Take 1 tablet (70 mg total) by mouth every 7 (seven) days. Take with a full glass of water on an empty stomach. 4 tablet 11  ? aspirin 81 MG EC tablet Take by mouth.    ? cetirizine (ZYRTEC) 10 MG tablet Take 10 mg by mouth daily.    ? Cholecalciferol 125 MCG (5000 UT) capsule Take 5,000 Units by mouth daily.    ? empagliflozin (JARDIANCE) 10 MG TABS tablet Take 1 tablet (10 mg total) by mouth daily. 90 tablet 1  ? escitalopram (LEXAPRO) 20 MG tablet Take 1 tablet (20 mg total) by mouth daily. 90 tablet 1  ? fenofibrate (TRICOR) 145 MG tablet Take 1 tablet (145 mg total) by mouth daily. 90 tablet 1  ?  ferrous sulfate 325 (65 FE) MG EC tablet Take 325 mg by mouth every morning.    ? gabapentin (NEURONTIN) 300 MG capsule Take 1 capsule (300 mg total) by mouth 2 (two) times daily. 180 capsule 1  ? ibuprofen (ADVIL) 200 MG tablet Take 200 mg by mouth in the morning and at bedtime.    ? ketoconazole (NIZORAL) 2 % shampoo Apply 1 application topically 2 (two) times a week. 120 mL 0  ? lisinopril (ZESTRIL) 20 MG tablet Take 20 mg by mouth daily.    ? montelukast (SINGULAIR) 10 MG tablet Take 1 tablet (10 mg total) by mouth every evening. 90 tablet 1  ? polyethylene glycol (MIRALAX / GLYCOLAX) packet Take 17 g by mouth daily. (Patient taking differently: Take 17 g by mouth every other day.) 14 each 0  ? pramipexole (MIRAPEX) 0.25 MG tablet Take 1 tablet (0.25 mg total) by mouth daily with breakfast. 90 tablet 1  ? albuterol (VENTOLIN HFA) 108 (90 Base) MCG/ACT  inhaler Inhale 2 puffs into the lungs every 6 (six) hours as needed for wheezing or shortness of breath. 8 g 3  ? Blood Glucose Monitoring Suppl (BLOOD GLUCOSE SYSTEM PAK) KIT Please dispense based on patient and insurance preference. Use as directed to monitor FSBS 1x daily. Dx: E11.9. 1 each 1  ? Lancet Devices MISC Please dispense based on patient and insurance preference. Use as directed to monitor FSBS 1x daily. Dx: E11.9. 1 each 1  ? Lancets MISC Please dispense based on patient and insurance preference. Use as directed to monitor FSBS 1x daily. Dx: E11.9. 100 each 1  ? lisinopril (ZESTRIL) 10 MG tablet Take 1 tablet (10 mg total) by mouth daily. (Patient not taking: Reported on 01/24/2022) 90 tablet 0  ? meclizine (ANTIVERT) 25 MG tablet Take 25 mg by mouth 3 (three) times daily as needed for dizziness.    ? ondansetron (ZOFRAN) 4 MG tablet Take 4 mg by mouth every 8 (eight) hours as needed for vomiting or nausea.    ? ? ?Results for orders placed or performed during the hospital encounter of 02/09/22 (from the past 48 hour(s))  ?Glucose, capillary     Status: Abnormal  ? Collection Time: 02/09/22  5:47 AM  ?Result Value Ref Range  ? Glucose-Capillary 130 (H) 70 - 99 mg/dL  ?  Comment: Glucose reference range applies only to samples taken after fasting for at least 8 hours.  ? Comment 1 Notify RN   ? Comment 2 Document in Chart   ? ?No results found. ? ?Review of Systems  ?Constitutional:  Negative for chills and fever.  ?HENT:  Negative for hearing loss.   ?Respiratory:  Negative for cough.   ?Cardiovascular:  Negative for chest pain and palpitations.  ?Gastrointestinal:  Negative for abdominal pain, nausea and vomiting.  ?Genitourinary:  Negative for dysuria and urgency.  ?Musculoskeletal:  Negative for myalgias and neck pain.  ?Skin:  Negative for rash.  ?Neurological:  Negative for dizziness and headaches.  ?Hematological:  Does not bruise/bleed easily.  ?Psychiatric/Behavioral:  Negative for suicidal  ideas.   ? ?Blood pressure 121/82, pulse 80, temperature 98.2 ?F (36.8 ?C), temperature source Oral, resp. rate 18, height _0  (1.549 m), weight 73.5 kg, SpO2 97 %. ?Physical Exam ?Constitutional:   ?   Appearance: She is well-developed.  ?HENT:  ?   Head: Not microcephalic. No raccoon eyes, abrasion or contusion.  ?   Right Ear: No drainage or swelling. No foreign body.  ?  Left Ear: No drainage or swelling. No foreign body.  ?   Nose: No laceration, mucosal edema or rhinorrhea.  ?Eyes:  ?   General:     ?   Right eye: No discharge.     ?   Left eye: No discharge.  ?   Pupils: Pupils are equal, round, and reactive to light.  ?Cardiovascular:  ?   Pulses:     ?     Carotid pulses are 2+ on the right side and 2+ on the left side. ?     Radial pulses are 2+ on the right side and 2+ on the left side.  ?     Dorsalis pedis pulses are 2+ on the right side and 2+ on the left side.  ?Pulmonary:  ?   Breath sounds: No decreased breath sounds, wheezing, rhonchi or rales.  ?Abdominal:  ?   General: There is no distension.  ?   Palpations: Abdomen is not rigid. There is no shifting dullness.  ?   Tenderness: There is no abdominal tenderness. There is no guarding. Negative signs include Murphy's sign and McBurney's sign.  ?Musculoskeletal:  ?   Cervical back: Neck supple.  ?Neurological:  ?   GCS: GCS eye subscore is 4. GCS verbal subscore is 5. GCS motor subscore is 6.  ?   Cranial Nerves: No cranial nerve deficit.  ?   Sensory: No sensory deficit.  ?Psychiatric:     ?   Mood and Affect: Mood is anxious.     ?   Speech: Speech normal.     ?   Behavior: Behavior normal.     ?   Thought Content: Thought content normal.  ?  ? ?Assessment/Plan ?66 yo female with primary hyperparathyroidism ?-parathyroidectomy ?-observation stay ? ?Mickeal Skinner, MD ?02/09/2022, 7:15 AM ? ? ? ?

## 2022-02-10 ENCOUNTER — Encounter (HOSPITAL_COMMUNITY): Payer: Self-pay | Admitting: General Surgery

## 2022-02-10 DIAGNOSIS — Z7984 Long term (current) use of oral hypoglycemic drugs: Secondary | ICD-10-CM | POA: Diagnosis not present

## 2022-02-10 DIAGNOSIS — Z7982 Long term (current) use of aspirin: Secondary | ICD-10-CM | POA: Diagnosis not present

## 2022-02-10 DIAGNOSIS — E210 Primary hyperparathyroidism: Secondary | ICD-10-CM | POA: Diagnosis not present

## 2022-02-10 DIAGNOSIS — E119 Type 2 diabetes mellitus without complications: Secondary | ICD-10-CM | POA: Diagnosis not present

## 2022-02-10 DIAGNOSIS — Z87891 Personal history of nicotine dependence: Secondary | ICD-10-CM | POA: Diagnosis not present

## 2022-02-10 DIAGNOSIS — I252 Old myocardial infarction: Secondary | ICD-10-CM | POA: Diagnosis not present

## 2022-02-10 DIAGNOSIS — J45909 Unspecified asthma, uncomplicated: Secondary | ICD-10-CM | POA: Diagnosis not present

## 2022-02-10 DIAGNOSIS — Z79899 Other long term (current) drug therapy: Secondary | ICD-10-CM | POA: Diagnosis not present

## 2022-02-10 DIAGNOSIS — I10 Essential (primary) hypertension: Secondary | ICD-10-CM | POA: Diagnosis not present

## 2022-02-10 DIAGNOSIS — D351 Benign neoplasm of parathyroid gland: Secondary | ICD-10-CM | POA: Diagnosis not present

## 2022-02-10 DIAGNOSIS — Z96651 Presence of right artificial knee joint: Secondary | ICD-10-CM | POA: Diagnosis not present

## 2022-02-10 LAB — CBC
HCT: 35.9 % — ABNORMAL LOW (ref 36.0–46.0)
Hemoglobin: 11.3 g/dL — ABNORMAL LOW (ref 12.0–15.0)
MCH: 28.3 pg (ref 26.0–34.0)
MCHC: 31.5 g/dL (ref 30.0–36.0)
MCV: 89.8 fL (ref 80.0–100.0)
Platelets: 267 10*3/uL (ref 150–400)
RBC: 4 MIL/uL (ref 3.87–5.11)
RDW: 13.8 % (ref 11.5–15.5)
WBC: 10 10*3/uL (ref 4.0–10.5)
nRBC: 0 % (ref 0.0–0.2)

## 2022-02-10 LAB — GLUCOSE, CAPILLARY
Glucose-Capillary: 123 mg/dL — ABNORMAL HIGH (ref 70–99)
Glucose-Capillary: 138 mg/dL — ABNORMAL HIGH (ref 70–99)
Glucose-Capillary: 99 mg/dL (ref 70–99)

## 2022-02-10 LAB — BASIC METABOLIC PANEL
Anion gap: 8 (ref 5–15)
BUN: 23 mg/dL (ref 8–23)
CO2: 20 mmol/L — ABNORMAL LOW (ref 22–32)
Calcium: 8.7 mg/dL — ABNORMAL LOW (ref 8.9–10.3)
Chloride: 112 mmol/L — ABNORMAL HIGH (ref 98–111)
Creatinine, Ser: 0.99 mg/dL (ref 0.44–1.00)
GFR, Estimated: >60 mL/min (ref >60)
Glucose, Bld: 103 mg/dL — ABNORMAL HIGH (ref 70–99)
Potassium: 3.3 mmol/L — ABNORMAL LOW (ref 3.5–5.1)
Sodium: 140 mmol/L (ref 135–145)

## 2022-02-10 LAB — SURGICAL PATHOLOGY

## 2022-02-10 MED ORDER — IBUPROFEN 800 MG PO TABS: 800.0000 mg | ORAL_TABLET | Freq: Three times a day (TID) | ORAL | 0 refills | Status: DC | PRN

## 2022-02-10 MED ORDER — POTASSIUM CHLORIDE 20 MEQ PO PACK
40.0000 meq | PACK | Freq: Two times a day (BID) | ORAL | Status: DC
Start: 2022-02-10 — End: 2022-02-10
  Filled 2022-02-10: qty 2

## 2022-02-10 NOTE — Discharge Summary (Signed)
?Physician Discharge Summary  ?Teresa Soto YME:[id number] DOB: December 30, 1955 DOA: 02/09/2022 ? ?PCP: Ivy Lynn, NP ? ?Admit date: 02/09/2022 ?Discharge date: 02/10/2022 ? ?Recommendations for Outpatient Follow-up:  ?Labs in 2 weeks (include homehealth, outpatient follow-up instructions, specific recommendations for PCP to follow-up on, etc.) ? ? Follow-up Information   ? ? Mason Dibiasio, Arta Bruce, MD Follow up in 3 week(s).   ?Specialty: General Surgery ?Contact information: ?[address] ?[address] ?[place] [address] ?[phone] ? ? ?  ?  ? ?  ?  ? ?  ? ?Discharge Diagnoses:  ?Principal Problem: ?  Primary hyperparathyroidism ([place]) ? ? ?Surgical Procedure: right superior parathyroidectomy ? ?Discharge Condition: Good ?Disposition: Home ? ?Diet recommendation: carb modified diet ? ? ?Hospital Course:  ?66 yo female underwent parathyroidectomy for hyperparathyroidism. She was observed overnight. She had some superficial bruising at the incision. She tolerated diet well. She was discharged home POD 1. ? ?Discharge Instructions ? ?Discharge Instructions   ? ? Call MD for:  difficulty breathing, headache or visual disturbances   Complete by: As directed ?  ? Call MD for:  hives   Complete by: As directed ?  ? Call MD for:  persistant nausea and vomiting   Complete by: As directed ?  ? Call MD for:  redness, tenderness, or signs of infection (pain, swelling, redness, odor or green/yellow discharge around incision site)   Complete by: As directed ?  ? Call MD for:  severe uncontrolled pain   Complete by: As directed ?  ? Call MD for:  temperature >100.4   Complete by: As directed ?  ? Diet - low sodium heart healthy   Complete by: As directed ?  ? Discharge wound care:   Complete by: As directed ?  ? Ok to Games developer. Glue will likely peel off in 1-3 weeks. No bandage required  ? Driving Restrictions   Complete by: As directed ?  ? No driving while on narcotics  ? Increase activity slowly   Complete by: As  directed ?  ? Lifting restrictions   Complete by: As directed ?  ? No lifting greater than 20 pounds for 3 weeks  ? ?  ? ?Allergies as of 02/10/2022   ? ?   Reactions  ? Fluticasone   ? headache  ? Pneumococcal Vaccine Other (See Comments)  ? Had rash, given both flu and PNA at same time   ? Pneumococcal Vaccines   ? Had rash, given both flu and PNA at same time   ? Statins Other (See Comments)  ? Influenza Vaccines Rash  ? Other Nausea And Vomiting  ? Peppers  ? ?  ? ?  ?Medication List  ?  ? ?TAKE these medications   ? ?albuterol 108 (90 Base) MCG/ACT inhaler ?Commonly known as: VENTOLIN HFA ?Inhale 2 puffs into the lungs every 6 (six) hours as needed for wheezing or shortness of breath. ?  ?alendronate 70 MG tablet ?Commonly known as: FOSAMAX ?Take 1 tablet (70 mg total) by mouth every 7 (seven) days. Take with a full glass of water on an empty stomach. ?  ?aspirin 81 MG EC tablet ?Take by mouth. ?  ?Blood Glucose System Pak Kit ?Please dispense based on patient and insurance preference. Use as directed to monitor FSBS 1x daily. Dx: E11.9. ?  ?cetirizine 10 MG tablet ?Commonly known as: ZYRTEC ?Take 10 mg by mouth daily. ?  ?Cholecalciferol 125 MCG (5000 UT) capsule ?Take 5,000 Units by  mouth daily. ?  ?empagliflozin 10 MG Tabs tablet ?Commonly known as: Jardiance ?Take 1 tablet (10 mg total) by mouth daily. ?  ?escitalopram 20 MG tablet ?Commonly known as: LEXAPRO ?Take 1 tablet (20 mg total) by mouth daily. ?  ?fenofibrate 145 MG tablet ?Commonly known as: TRICOR ?Take 1 tablet (145 mg total) by mouth daily. ?  ?ferrous sulfate 325 (65 FE) MG EC tablet ?Take 325 mg by mouth every morning. ?  ?gabapentin 300 MG capsule ?Commonly known as: NEURONTIN ?Take 1 capsule (300 mg total) by mouth 2 (two) times daily. ?  ?ibuprofen 200 MG tablet ?Commonly known as: ADVIL ?Take 200 mg by mouth in the morning and at bedtime. ?What changed: Another medication with the same name was added. Make sure you understand how and when  to take each. ?  ?ibuprofen 800 MG tablet ?Commonly known as: ADVIL ?Take 1 tablet (800 mg total) by mouth every 8 (eight) hours as needed. ?What changed: You were already taking a medication with the same name, and this prescription was added. Make sure you understand how and when to take each. ?  ?ketoconazole 2 % shampoo ?Commonly known as: Nizoral ?Apply 1 application topically 2 (two) times a week. ?  ?Lancet Devices Misc ?Please dispense based on patient and insurance preference. Use as directed to monitor FSBS 1x daily. Dx: E11.9. ?  ?Lancets Misc ?Please dispense based on patient and insurance preference. Use as directed to monitor FSBS 1x daily. Dx: E11.9. ?  ?lisinopril 10 MG tablet ?Commonly known as: ZESTRIL ?Take 1 tablet (10 mg total) by mouth daily. ?  ?lisinopril 20 MG tablet ?Commonly known as: ZESTRIL ?Take 20 mg by mouth daily. ?  ?meclizine 25 MG tablet ?Commonly known as: ANTIVERT ?Take 25 mg by mouth 3 (three) times daily as needed for dizziness. ?  ?montelukast 10 MG tablet ?Commonly known as: SINGULAIR ?Take 1 tablet (10 mg total) by mouth every evening. ?  ?ondansetron 4 MG tablet ?Commonly known as: ZOFRAN ?Take 4 mg by mouth every 8 (eight) hours as needed for vomiting or nausea. ?  ?polyethylene glycol 17 g packet ?Commonly known as: MIRALAX / GLYCOLAX ?Take 17 g by mouth daily. ?What changed: when to take this ?  ?pramipexole 0.25 MG tablet ?Commonly known as: MIRAPEX ?Take 1 tablet (0.25 mg total) by mouth daily with breakfast. ?  ? ?  ? ?  ?  ? ? ?  ?Discharge Care Instructions  ?(From admission, onward)  ?  ? ? ?  ? ?  Start     Ordered  ? 02/10/22 0000  Discharge wound care:       ?Comments: Ok to Games developer. Glue will likely peel off in 1-3 weeks. No bandage required  ? 02/10/22 0935  ? ?  ?  ? ?  ? ? Follow-up Information   ? ? , Arta Bruce, MD Follow up in 3 week(s).   ?Specialty: General Surgery ?Contact information: ?[address] ?[address] ?[place]  [address] ?[phone] ? ? ?  ?  ? ?  ?  ? ?  ? ? ? ?The results of significant diagnostics from this hospitalization (including imaging, microbiology, ancillary and laboratory) are listed below for reference.   ? ?Significant Diagnostic Studies: ?No results found. ? ?Labs: ?Basic Metabolic Panel: ?Recent Labs  ?Lab 02/10/22 ?0433  ?NA 140  ?K 3.3*  ?CL 112*  ?CO2 20*  ?GLUCOSE 103*  ?BUN 23  ?CREATININE 0.99  ?CALCIUM 8.7*  ? ?  Liver Function Tests: ?No results for input(s): AST, ALT, ALKPHOS, BILITOT, PROT, ALBUMIN in the last 168 hours. ? ?CBC: ?Recent Labs  ?Lab 02/10/22 ?0433  ?WBC 10.0  ?HGB 11.3*  ?HCT 35.9*  ?MCV 89.8  ?PLT 267  ? ? ?CBG: ?Recent Labs  ?Lab 02/09/22 ?1545 02/09/22 ?1942 02/10/22 ?0031 02/10/22 ?0086 02/10/22 ?7619  ?GLUCAP 162* 196* 138* 123* 99  ? ? ?Principal Problem: ?  Primary hyperparathyroidism ([place]) ? ? ?Time coordinating discharge: 15 min ? ? ?

## 2022-02-10 NOTE — Progress Notes (Signed)
Discharge instructions discussed with patient and family, verbalized agreement and understanding 

## 2022-02-10 NOTE — Discharge Instructions (Signed)
[facility]      [facility] [phone]  THYROID/ PARATHYROID SURGERY: POST OP INSTRUCTIONS  Always review your discharge instruction sheet given to you by the facility where your surgery was performed.  IF YOU HAVE DISABILITY OR FAMILY LEAVE FORMS, YOU MUST BRING THEM TO THE OFFICE FOR PROCESSING.  PLEASE DO NOT GIVE THEM TO YOUR DOCTOR.  A prescription for pain medication may be given to you upon discharge.  Take your pain medication as prescribed, if needed.  If narcotic pain medicine is not needed, then you may take acetaminophen (Tylenol) or ibuprofen (Advil) as needed. Take your usually prescribed medications unless otherwise directed. If you need a refill on your pain medication, please contact your pharmacy. They will contact our office to request authorization.  Prescriptions will not be filled after 5pm or on week-ends. You should follow a light diet the first 24 hours after arrival home, such as soup and crackers, etc.  Be sure to include lots of fluids daily.  Resume your normal diet the day after surgery. Most patients will experience some swelling and bruising on the chest and neck area.  Ice packs will help.  Swelling and bruising can take several days to resolve.  It is common to experience some constipation if taking pain medication after surgery.  Increasing fluid intake and taking a stool softener will usually help or prevent this problem from occurring.  A mild laxative (Milk of Magnesia or Miralax) should be taken according to package directions if there are no bowel movements after 48 hours. Unless discharge instructions indicate otherwise, you may remove your bandages 24-48 hours after surgery, and you may shower at that time.  You may have steri-strips (small skin tapes) in place directly over the incision.  These strips should be left on the skin for 7-10 days.  If your surgeon used skin glue on the incision, you may shower in 24 hours.  The glue will flake off over  the next 2-3 weeks.  Any sutures or staples will be removed at the office during your follow-up visit. ACTIVITIES:  You may resume regular (light) daily activities beginning the next day--such as daily self-care, walking, climbing stairs--gradually increasing activities as tolerated.  You may have sexual intercourse when it is comfortable.  Refrain from any heavy lifting or straining until approved by your doctor. You may drive when you no longer are taking prescription pain medication, you can comfortably wear a seatbelt, and you can safely maneuver your car and apply brakes RETURN TO WORK:  __________________________________________________________ You should see your doctor in the office for a follow-up appointment approximately two weeks after your surgery.  Make sure that you call for this appointment within a day or two after you arrive home to insure a convenient appointment time. OTHER INSTRUCTIONS: ____________________________________________________________________________ _________________________________________________________________________________________________________________ _________________________________________________________________________________________________________________   WHEN TO CALL YOUR DOCTOR: Fever over 101.0 Inability to urinate Nausea and/or vomiting Extreme swelling or bruising Continued bleeding from incision. Increased pain, redness, or drainage from the incision. Difficulty swallowing or breathing Muscle cramping or spasms. Numbness or tingling in hands or feet or around lips.  The clinic staff is available to answer your questions during regular business hours.  Please don't hesitate to call and ask to speak to one of the nurses if you have concerns.  For further questions, please visit [email or web address]  

## 2022-02-10 NOTE — Progress Notes (Signed)
Transition of Care (TOC) Screening Note ? ?Patient Details  ?Name: Teresa Soto ?Date of Birth: 12-Oct-1955 ? ?Transition of Care (TOC) CM/SW Contact:    ?Sherie Don, LCSW ?Phone Number: ?02/10/2022, 9:44 AM ? ?Transition of Care Department [facility]) has reviewed patient and no TOC needs have been identified at this time. We will continue to monitor patient advancement through interdisciplinary progression rounds. If new patient transition needs arise, please place a TOC consult. ?

## 2022-02-11 LAB — CALCIUM, IONIZED: Calcium, Ionized, Serum: 5.8 mg/dL — ABNORMAL HIGH (ref 4.5–5.6)

## 2022-02-24 DIAGNOSIS — H9041 Sensorineural hearing loss, unilateral, right ear, 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: Secondary | ICD-10-CM | POA: Diagnosis not present

## 2022-02-24 DIAGNOSIS — H6522 Chronic serous otitis media, left ear: Secondary | ICD-10-CM | POA: Diagnosis not present

## 2022-02-24 DIAGNOSIS — J310 Chronic rhinitis: Secondary | ICD-10-CM | POA: Diagnosis not present

## 2022-02-24 DIAGNOSIS — H6982 Other specified disorders of Eustachian tube, left ear: Secondary | ICD-10-CM | POA: Diagnosis not present

## 2022-02-24 DIAGNOSIS — J343 Hypertrophy of nasal turbinates: Secondary | ICD-10-CM | POA: Diagnosis not present

## 2022-02-24 DIAGNOSIS — H8101 Meniere's disease, right ear: Secondary | ICD-10-CM | POA: Diagnosis not present

## 2022-02-25 NOTE — Progress Notes (Deleted)
Referring Provider:Dr. Theador Hawthorne Primary Care Physician:  Ivy Lynn, NP Primary Gastroenterologist:  Dr. Rayne Du chief complaint on file.   HPI:   Teresa Soto is a 66 y.o. female presenting today at the request of Dr. Theador Hawthorne for IDA.  In December, patient was found to have low iron saturation 11%.  Iron was within normal limits at 46, ferritin normal at 38.  Hemoglobin was within normal limits at 14.0.  Dr. Theador Hawthorne recommended starting iron supplementation in January.  Cologuard negative April 2023.   Past Medical History:  Diagnosis Date   Arthritis of knee, right 08/12/2014   Asthma    [employer] 06/16/2020   Chronic back pain    Chronic constipation    Depression    Diabetes mellitus without complication ([place])    [employer] 06/16/2020   History of bronchitis    History of urinary tract infection    Hyperlipidemia    [employer] 06/16/2020   Hypertension    Meniere disease    vertigo   Neuropathic pain    Osteoporosis    [employer] 06/16/2020   Parathyroid adenoma    Poor fine motor skills    secondary to CVA per right side    PTSD (post-traumatic stress disorder)    Restless legs syndrome 2007 approx   Seasonal allergies    Shortness of breath dyspnea    coldness    Stroke ([place]) 2015    Past Surgical History:  Procedure Laterality Date   BACK SURGERY  2012   L1-L2  x2   CESAREAN SECTION     x 2   PARATHYROIDECTOMY N/A 06/27/2016   Procedure: MINIMALLY INVASIVE PARATHYROIDECTOMY;  Surgeon: Greer Pickerel, MD;  Location: [facility];  Service: General;  Laterality: N/A;   PARATHYROIDECTOMY Right 02/09/2022   Procedure: PARATHYROIDECTOMY;  Surgeon: Kinsinger, Arta Bruce, MD;  Location: [facility];  Service: General;  Laterality: Right;   SHOULDER ARTHROSCOPY W/ ROTATOR CUFF REPAIR  2010   to right shoulder   TONSILLECTOMY     TOTAL KNEE ARTHROPLASTY Right 08/12/2014   Procedure: RIGHT TOTAL KNEE ARTHROPLASTY;  Surgeon: Carole Civil, MD;  Location: [facility];   Service: Orthopedics;  Laterality: Right;   TUBAL LIGATION  1999    Current Outpatient Medications  Medication Sig Dispense Refill   albuterol (VENTOLIN HFA) 108 (90 Base) MCG/ACT inhaler Inhale 2 puffs into the lungs every 6 (six) hours as needed for wheezing or shortness of breath. 8 g 3   alendronate (FOSAMAX) 70 MG tablet Take 1 tablet (70 mg total) by mouth every 7 (seven) days. Take with a full glass of water on an empty stomach. 4 tablet 11   aspirin 81 MG EC tablet Take by mouth.     Blood Glucose Monitoring Suppl (BLOOD GLUCOSE SYSTEM PAK) KIT Please dispense based on patient and insurance preference. Use as directed to monitor FSBS 1x daily. Dx: E11.9. 1 each 1   cetirizine (ZYRTEC) 10 MG tablet Take 10 mg by mouth daily.     Cholecalciferol 125 MCG (5000 UT) capsule Take 5,000 Units by mouth daily.     empagliflozin (JARDIANCE) 10 MG TABS tablet Take 1 tablet (10 mg total) by mouth daily. 90 tablet 1   escitalopram (LEXAPRO) 20 MG tablet Take 1 tablet (20 mg total) by mouth daily. 90 tablet 1   fenofibrate (TRICOR) 145 MG tablet Take 1 tablet (145 mg total) by mouth daily. 90 tablet 1   ferrous sulfate 325 (65 FE) MG  EC tablet Take 325 mg by mouth every morning.     gabapentin (NEURONTIN) 300 MG capsule Take 1 capsule (300 mg total) by mouth 2 (two) times daily. 180 capsule 1   ibuprofen (ADVIL) 200 MG tablet Take 200 mg by mouth in the morning and at bedtime.     ibuprofen (ADVIL) 800 MG tablet Take 1 tablet (800 mg total) by mouth every 8 (eight) hours as needed. 30 tablet 0   ketoconazole (NIZORAL) 2 % shampoo Apply 1 application topically 2 (two) times a week. 120 mL 0   Lancet Devices MISC Please dispense based on patient and insurance preference. Use as directed to monitor FSBS 1x daily. Dx: E11.9. 1 each 1   Lancets MISC Please dispense based on patient and insurance preference. Use as directed to monitor FSBS 1x daily. Dx: E11.9. 100 each 1   lisinopril (ZESTRIL) 10 MG tablet  Take 1 tablet (10 mg total) by mouth daily. (Patient not taking: Reported on 01/24/2022) 90 tablet 0   lisinopril (ZESTRIL) 20 MG tablet Take 20 mg by mouth daily.     meclizine (ANTIVERT) 25 MG tablet Take 25 mg by mouth 3 (three) times daily as needed for dizziness.     montelukast (SINGULAIR) 10 MG tablet Take 1 tablet (10 mg total) by mouth every evening. 90 tablet 1   ondansetron (ZOFRAN) 4 MG tablet Take 4 mg by mouth every 8 (eight) hours as needed for vomiting or nausea.     polyethylene glycol (MIRALAX / GLYCOLAX) packet Take 17 g by mouth daily. (Patient taking differently: Take 17 g by mouth every other day.) 14 each 0   pramipexole (MIRAPEX) 0.25 MG tablet Take 1 tablet (0.25 mg total) by mouth daily with breakfast. 90 tablet 1   No current facility-administered medications for this visit.    Allergies as of 02/27/2022 - Review Complete 02/09/2022  Allergen Reaction Noted   Fluticasone  01/24/2022   Pneumococcal vaccine Other (See Comments) 05/07/2018   Pneumococcal vaccines  05/07/2018   Statins Other (See Comments) 05/23/2017   Influenza vaccines Rash 10/20/2013   Other Nausea And Vomiting 01/02/2013    Family History  Problem Relation Age of Onset   Hypertension Mother    Hyperlipidemia Mother    Dementia Paternal Grandmother     Social History   Socioeconomic History   Marital status: Married    Spouse name: Not on file   Number of children: Not on file   Years of education: Not on file   Highest education level: Not on file  Occupational History   Not on file  Tobacco Use   Smoking status: Former    Packs/day: 2.00    Years: 6.00    Pack years: 12.00    Types: Cigarettes    Quit date: 10/09/1982    Years since quitting: 39.4   Smokeless tobacco: Never   Tobacco comments:    quit in 1984  Vaping Use   Vaping Use: Former  Substance and Sexual Activity   Alcohol use: Yes    Comment: 4oz daily   Drug use: No   Sexual activity: Yes    Birth  control/protection: Surgical  Other Topics Concern   Not on file  Social History Narrative   Not on file   Social Determinants of Health   Financial Resource Strain: Not on file  Food Insecurity: Not on file  Transportation Needs: Not on file  Physical Activity: Not on file  Stress: Not on file  Social Connections: Not on file  Intimate Partner Violence: Not on file    Review of Systems: Gen: Denies any fever, chills,cold or flu like symptoms, pre-syncope, or syncope.   CV: Denies chest pain, heart palpitations.  Resp: Denies shortness of breath, cough.  GI: See HPI GU : Denies urinary burning, urinary frequency, urinary hesitancy MS: Denies joint pain. Derm: Denies rash. Psych: Denies depression, anxiety. Heme: See HPI  Physical Exam: There were no vitals taken for this visit. General:   Alert and oriented. Pleasant and cooperative. Well-nourished and well-developed.  Head:  Normocephalic and atraumatic. Eyes:  Without icterus, sclera clear and conjunctiva pink.  Ears:  Normal auditory acuity. Lungs:  Clear to auscultation bilaterally. No wheezes, rales, or rhonchi. No distress.  Heart:  S1, S2 present without murmurs appreciated.  Abdomen:  +BS, soft, non-tender and non-distended. No HSM noted. No guarding or rebound. No masses appreciated.  Rectal:  Deferred  Msk:  Symmetrical without gross deformities. Normal posture. Extremities:  Without edema. Neurologic:  Alert and  oriented x4;  grossly normal neurologically. Skin:  Intact without significant lesions or rashes. Psych: Normal mood and affect.    Assessment:     Plan:  ***   Aliene Altes, PA-C [facility] 02/27/2022

## 2022-02-27 ENCOUNTER — Ambulatory Visit: Payer: BC Managed Care – PPO | Admitting: Gastroenterology

## 2022-03-15 ENCOUNTER — Encounter: Payer: Self-pay | Admitting: Gastroenterology

## 2022-03-29 DIAGNOSIS — E212 Other hyperparathyroidism: Secondary | ICD-10-CM | POA: Diagnosis not present

## 2022-03-29 NOTE — Progress Notes (Deleted)
GI Office Note    Referring Provider: Ivy Lynn, NP Primary Care Physician:  Ivy Lynn, NP  Primary Gastroenterologist: Dr. Abbey Chatters  Chief Complaint   No chief complaint on file.    History of Present Illness   Teresa Soto is a 66 y.o. female presenting today at the request of Dr. Theador Hawthorne for Iron deficiency without anemia.   Per chart review - no evidence of prior colonoscopy.   Most recent iron panel on 09/28/2021 with ferritin 38, iron 46, iron sat 11%.  Most recent CBC with hemoglobin 11.3 (post parathyroidectomy), previous hemoglobin 15.1 on 01/27/2022.  Prior to this hemoglobins have been normal.  Iron deficiency -  Melena? BRBPR? Pallor? PICA? Resless leg? dyspnea? NSAIDs? Family history of colon cancer? Family history of celiac? ?Dry or rough skin? 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 Diarrhea? Constipation?   Taking oral iron daily?  Past Medical History:  Diagnosis Date   Arthritis of knee, right 08/12/2014   Asthma    [employer] 06/16/2020   Chronic back pain    Chronic constipation    Depression    Diabetes mellitus without complication ([place])    [employer] 06/16/2020   History of bronchitis    History of urinary tract infection    Hyperlipidemia    [employer]ia 06/16/2020   Hypertension    Meniere disease    vertigo   Neuropathic pain    Osteoporosis    [employer]ia 06/16/2020   Parathyroid adenoma    Poor fine motor skills    secondary to CVA per right side    PTSD (post-traumatic stress disorder)    Restless legs syndrome 2007 approx   Seasonal allergies    Shortness of breath dyspnea    coldness    Stroke ([place]) 2015    Past Surgical History:  Procedure Laterality Date   BACK SURGERY  2012   L1-L2  x2   CESAREAN SECTION     x 2    PARATHYROIDECTOMY N/A 06/27/2016   Procedure: MINIMALLY INVASIVE PARATHYROIDECTOMY;  Surgeon: Greer Pickerel, MD;  Location: [facility];  Service: General;  Laterality: N/A;   PARATHYROIDECTOMY Right 02/09/2022   Procedure: PARATHYROIDECTOMY;  Surgeon: Kinsinger, Arta Bruce, MD;  Location: [facility];  Service: General;  Laterality: Right;   SHOULDER ARTHROSCOPY W/ ROTATOR CUFF REPAIR  2010   to right shoulder   TONSILLECTOMY     TOTAL KNEE ARTHROPLASTY Right 08/12/2014   Procedure: RIGHT TOTAL KNEE ARTHROPLASTY;  Surgeon: Carole Civil, MD;  Location: [facility];  Service: Orthopedics;  Laterality: Right;   TUBAL LIGATION  1999    Current Outpatient Medications  Medication Sig Dispense Refill   albuterol (VENTOLIN HFA) 108 (90 Base) MCG/ACT inhaler Inhale 2 puffs into the lungs every 6 (six) hours as needed for wheezing or shortness of breath. 8 g 3   alendronate (FOSAMAX) 70 MG tablet Take 1 tablet (70 mg total) by mouth every 7 (seven) days. Take with a full glass of water on an empty stomach. 4 tablet 11   aspirin 81 MG EC tablet Take by mouth.     Blood Glucose Monitoring Suppl (BLOOD GLUCOSE SYSTEM PAK) KIT Please dispense based on patient and insurance preference. Use as directed to monitor FSBS 1x daily. Dx: E11.9. 1 each 1   cetirizine (ZYRTEC) 10 MG tablet  Take 10 mg by mouth daily.     Cholecalciferol 125 MCG (5000 UT) capsule Take 5,000 Units by mouth daily.     empagliflozin (JARDIANCE) 10 MG TABS tablet Take 1 tablet (10 mg total) by mouth daily. 90 tablet 1   escitalopram (LEXAPRO) 20 MG tablet Take 1 tablet (20 mg total) by mouth daily. 90 tablet 1   fenofibrate (TRICOR) 145 MG tablet Take 1 tablet (145 mg total) by mouth daily. 90 tablet 1   ferrous sulfate 325 (65 FE) MG EC tablet Take 325 mg by mouth every morning.     gabapentin (NEURONTIN) 300 MG capsule Take 1 capsule (300 mg total) by mouth 2 (two) times daily. 180 capsule 1   ibuprofen (ADVIL) 200 MG tablet Take 200 mg by mouth  in the morning and at bedtime.     ibuprofen (ADVIL) 800 MG tablet Take 1 tablet (800 mg total) by mouth every 8 (eight) hours as needed. 30 tablet 0   ketoconazole (NIZORAL) 2 % shampoo Apply 1 application topically 2 (two) times a week. 120 mL 0   Lancet Devices MISC Please dispense based on patient and insurance preference. Use as directed to monitor FSBS 1x daily. Dx: E11.9. 1 each 1   Lancets MISC Please dispense based on patient and insurance preference. Use as directed to monitor FSBS 1x daily. Dx: E11.9. 100 each 1   lisinopril (ZESTRIL) 10 MG tablet Take 1 tablet (10 mg total) by mouth daily. (Patient not taking: Reported on 01/24/2022) 90 tablet 0   lisinopril (ZESTRIL) 20 MG tablet Take 20 mg by mouth daily.     meclizine (ANTIVERT) 25 MG tablet Take 25 mg by mouth 3 (three) times daily as needed for dizziness.     montelukast (SINGULAIR) 10 MG tablet Take 1 tablet (10 mg total) by mouth every evening. 90 tablet 1   ondansetron (ZOFRAN) 4 MG tablet Take 4 mg by mouth every 8 (eight) hours as needed for vomiting or nausea.     polyethylene glycol (MIRALAX / GLYCOLAX) packet Take 17 g by mouth daily. (Patient taking differently: Take 17 g by mouth every other day.) 14 each 0   pramipexole (MIRAPEX) 0.25 MG tablet Take 1 tablet (0.25 mg total) by mouth daily with breakfast. 90 tablet 1   No current facility-administered medications for this visit.    Allergies as of 04/03/2022 - Review Complete 02/09/2022  Allergen Reaction Noted   Fluticasone  01/24/2022   Pneumococcal vaccine Other (See Comments) 05/07/2018   Pneumococcal vaccines  05/07/2018   Statins Other (See Comments) 05/23/2017   Influenza vaccines Rash 10/20/2013   Other Nausea And Vomiting 01/02/2013    Family History  Problem Relation Age of Onset   Hypertension Mother    Hyperlipidemia Mother    Dementia Paternal Grandmother     Social History   Socioeconomic History   Marital status: Married    Spouse name:  Not on file   Number of children: Not on file   Years of education: Not on file   Highest education level: Not on file  Occupational History   Not on file  Tobacco Use   Smoking status: Former    Packs/day: 2.00    Years: 6.00    Total pack years: 12.00    Types: Cigarettes    Quit date: 10/09/1982    Years since quitting: 39.5   Smokeless tobacco: Never   Tobacco comments:    quit in 1984  Vaping Use  Vaping Use: Former  Substance and Sexual Activity   Alcohol use: Yes    Comment: 4oz daily   Drug use: No   Sexual activity: Yes    Birth control/protection: Surgical  Other Topics Concern   Not on file  Social History Narrative   Not on file   Social Determinants of Health   Financial Resource Strain: Not on file  Food Insecurity: Not on file  Transportation Needs: Not on file  Physical Activity: Not on file  Stress: Not on file  Social Connections: Not on file  Intimate Partner Violence: Not on file     Review of Systems   Gen: Denies any fever, chills, fatigue, weight loss, lack of appetite.  CV: Denies chest pain, heart palpitations, peripheral edema, syncope.  Resp: Denies shortness of breath at rest or with exertion. Denies wheezing or cough.  GI: Denies dysphagia or odynophagia. Denies jaundice, hematemesis, fecal incontinence. GU : Denies urinary burning, urinary frequency, urinary hesitancy MS: Denies joint pain, muscle weakness, cramps, or limitation of movement.  Derm: Denies rash, itching, dry skin Psych: Denies depression, anxiety, memory loss, and confusion Heme: Denies bruising, bleeding, and enlarged lymph nodes.   Physical Exam   There were no vitals taken for this visit. General:   Alert and oriented. Pleasant and cooperative. Well-nourished and well-developed.  Head:  Normocephalic and atraumatic. Eyes:  Without icterus, sclera clear and conjunctiva pink.  Ears:  Normal auditory acuity. Mouth:  No deformity or lesions, oral mucosa pink.   Lungs:  Clear to auscultation bilaterally. No wheezes, rales, or rhonchi. No distress.  Heart:  S1, S2 present without murmurs appreciated.  Abdomen:  +BS, soft, non-tender and non-distended. No HSM noted. No guarding or rebound. No masses appreciated.  Rectal:  Deferred  Msk:  Symmetrical without gross deformities. Normal posture. Extremities:  Without edema. Neurologic:  Alert and  oriented x4;  grossly normal neurologically. Skin:  Intact without significant lesions or rashes. Psych:  Alert and cooperative. Normal mood and affect.   Assessment   TRISTINE LANGI is a 66 y.o. female with a history of *** presenting today with    PLAN   *****    Venetia Night, MSN, FNP-BC, AGACNP-BC [facility]   GI Office Note    Referring Provider: Ivy Lynn, NP Primary Care Physician:  Ivy Lynn, NP    Chief Complaint   No chief complaint on file.    History of Present Illness   Teresa Soto is a 66 y.o. female presenting today at the request of Ivy Lynn, NP for ***       Past Medical History:  Diagnosis Date   Arthritis of knee, right 08/12/2014   Asthma    [employer]ia 06/16/2020   Chronic back pain    Chronic constipation    Depression    Diabetes mellitus without complication ([place])    [employer]ia 06/16/2020   History of bronchitis    History of urinary tract infection    Hyperlipidemia    [employer]ia 06/16/2020   Hypertension    Meniere disease    vertigo   Neuropathic pain    Osteoporosis    [employer]ia 06/16/2020   Parathyroid adenoma    Poor fine motor skills    secondary to CVA per right side    PTSD (post-traumatic stress disorder)    Restless legs syndrome 2007 approx   Seasonal allergies    Shortness of breath dyspnea    coldness  Stroke [facility]) 2015    Past Surgical History:  Procedure Laterality Date   BACK SURGERY  2012   L1-L2  x2   CESAREAN SECTION     x 2   PARATHYROIDECTOMY N/A 06/27/2016    Procedure: MINIMALLY INVASIVE PARATHYROIDECTOMY;  Surgeon: Greer Pickerel, MD;  [facility];  Service: General;  Laterality: N/A;   PARATHYROIDECTOMY Right 02/09/2022   Procedure: PARATHYROIDECTOMY;  Surgeon: Kinsinger, Arta Bruce, MD;  Location: [facility];  Service: General;  Laterality: Right;   SHOULDER ARTHROSCOPY W/ ROTATOR CUFF REPAIR  2010   to right shoulder   TONSILLECTOMY     TOTAL KNEE ARTHROPLASTY Right 08/12/2014   Procedure: RIGHT TOTAL KNEE ARTHROPLASTY;  Surgeon: Carole Civil, MD;  Location: [facility];  Service: Orthopedics;  Laterality: Right;   TUBAL LIGATION  1999    Current Outpatient Medications  Medication Sig Dispense Refill   albuterol (VENTOLIN HFA) 108 (90 Base) MCG/ACT inhaler Inhale 2 puffs into the lungs every 6 (six) hours as needed for wheezing or shortness of breath. 8 g 3   alendronate (FOSAMAX) 70 MG tablet Take 1 tablet (70 mg total) by mouth every 7 (seven) days. Take with a full glass of water on an empty stomach. 4 tablet 11   aspirin 81 MG EC tablet Take by mouth.     Blood Glucose Monitoring Suppl (BLOOD GLUCOSE SYSTEM PAK) KIT Please dispense based on patient and insurance preference. Use as directed to monitor FSBS 1x daily. Dx: E11.9. 1 each 1   cetirizine (ZYRTEC) 10 MG tablet Take 10 mg by mouth daily.     Cholecalciferol 125 MCG (5000 UT) capsule Take 5,000 Units by mouth daily.     empagliflozin (JARDIANCE) 10 MG TABS tablet Take 1 tablet (10 mg total) by mouth daily. 90 tablet 1   escitalopram (LEXAPRO) 20 MG tablet Take 1 tablet (20 mg total) by mouth daily. 90 tablet 1   fenofibrate (TRICOR) 145 MG tablet Take 1 tablet (145 mg total) by mouth daily. 90 tablet 1   ferrous sulfate 325 (65 FE) MG EC tablet Take 325 mg by mouth every morning.     gabapentin (NEURONTIN) 300 MG capsule Take 1 capsule (300 mg total) by mouth 2 (two) times daily. 180 capsule 1   ibuprofen (ADVIL) 200 MG tablet Take 200 mg by mouth in the morning and at bedtime.      ibuprofen (ADVIL) 800 MG tablet Take 1 tablet (800 mg total) by mouth every 8 (eight) hours as needed. 30 tablet 0   ketoconazole (NIZORAL) 2 % shampoo Apply 1 application topically 2 (two) times a week. 120 mL 0   Lancet Devices MISC Please dispense based on patient and insurance preference. Use as directed to monitor FSBS 1x daily. Dx: E11.9. 1 each 1   Lancets MISC Please dispense based on patient and insurance preference. Use as directed to monitor FSBS 1x daily. Dx: E11.9. 100 each 1   lisinopril (ZESTRIL) 10 MG tablet Take 1 tablet (10 mg total) by mouth daily. (Patient not taking: Reported on 01/24/2022) 90 tablet 0   lisinopril (ZESTRIL) 20 MG tablet Take 20 mg by mouth daily.     meclizine (ANTIVERT) 25 MG tablet Take 25 mg by mouth 3 (three) times daily as needed for dizziness.     montelukast (SINGULAIR) 10 MG tablet Take 1 tablet (10 mg total) by mouth every evening. 90 tablet 1   ondansetron (ZOFRAN) 4 MG tablet Take 4 mg by mouth  every 8 (eight) hours as needed for vomiting or nausea.     polyethylene glycol (MIRALAX / GLYCOLAX) packet Take 17 g by mouth daily. (Patient taking differently: Take 17 g by mouth every other day.) 14 each 0   pramipexole (MIRAPEX) 0.25 MG tablet Take 1 tablet (0.25 mg total) by mouth daily with breakfast. 90 tablet 1   No current facility-administered medications for this visit.    Allergies as of 04/03/2022 - Review Complete 02/09/2022  Allergen Reaction Noted   Fluticasone  01/24/2022   Pneumococcal vaccine Other (See Comments) 05/07/2018   Pneumococcal vaccines  05/07/2018   Statins Other (See Comments) 05/23/2017   Influenza vaccines Rash 10/20/2013   Other Nausea And Vomiting 01/02/2013    Family History  Problem Relation Age of Onset   Hypertension Mother    Hyperlipidemia Mother    Dementia Paternal Grandmother     Social History   Socioeconomic History   Marital status: Married    Spouse name: Not on file   Number of children:  Not on file   Years of education: Not on file   Highest education level: Not on file  Occupational History   Not on file  Tobacco Use   Smoking status: Former    Packs/day: 2.00    Years: 6.00    Total pack years: 12.00    Types: Cigarettes    Quit date: 10/09/1982    Years since quitting: 39.5   Smokeless tobacco: Never   Tobacco comments:    quit in 1984  Vaping Use   Vaping Use: Former  Substance and Sexual Activity   Alcohol use: Yes    Comment: 4oz daily   Drug use: No   Sexual activity: Yes    Birth control/protection: Surgical  Other Topics Concern   Not on file  Social History Narrative   Not on file   Social Determinants of Health   Financial Resource Strain: Not on file  Food Insecurity: Not on file  Transportation Needs: Not on file  Physical Activity: Not on file  Stress: Not on file  Social Connections: Not on file  Intimate Partner Violence: Not on file     Review of Systems   Gen: Denies any fever, chills, fatigue, weight loss, lack of appetite.  CV: Denies chest pain, heart palpitations, peripheral edema, syncope.  Resp: Denies shortness of breath at rest or with exertion. Denies wheezing or cough.  GI: Denies dysphagia or odynophagia. Denies jaundice, hematemesis, fecal incontinence. GU : Denies urinary burning, urinary frequency, urinary hesitancy MS: Denies joint pain, muscle weakness, cramps, or limitation of movement.  Derm: Denies rash, itching, dry skin Psych: Denies depression, anxiety, memory loss, and confusion Heme: Denies bruising, bleeding, and enlarged lymph nodes.   Physical Exam   There were no vitals taken for this visit. General:   Alert and oriented. Pleasant and cooperative. Well-nourished and well-developed.  Head:  Normocephalic and atraumatic. Eyes:  Without icterus, sclera clear and conjunctiva pink.  Ears:  Normal auditory acuity. Mouth:  No deformity or lesions, oral mucosa pink.  Lungs:  Clear to auscultation  bilaterally. No wheezes, rales, or rhonchi. No distress.  Heart:  S1, S2 present without murmurs appreciated.  Abdomen:  +BS, soft, non-tender and non-distended. No HSM noted. No guarding or rebound. No masses appreciated.  Rectal:  Deferred  Msk:  Symmetrical without gross deformities. Normal posture. Extremities:  Without edema. Neurologic:  Alert and  oriented x4;  grossly normal neurologically. Skin:  Intact  without significant lesions or rashes. Psych:  Alert and cooperative. Normal mood and affect.   Assessment   Teresa Soto is a 66 y.o. female with a history of asthma, depression, diabetes, HLD, HTN, Mnire's disease, parathyroid adenoma/hyperparathyroidism, CKD stage II/IIIa, PTSD, stroke in 2015*** presenting today with   Iron deficiency without anemia: On oral ferrous sulfate 325 mg daily. Most recent hemoglobin 11.3 on 5/5 s/p parathyroidectomy.  Prior hemoglobin on 4/21 was 15.1.  Most recent iron panel on 09/28/2021 with ferritin 38, iron 46, iron sat 11%, TIBC 418.   Rule out celiac?  PLAN   *** CBC, Iron panel, IgA, Ttg IgA Continue oral ferrous sulfate 325 mg daily, will need to hold for 10 days prior to colonoscopy  Proceed with colonoscopy with propofol by Dr. Abbey Chatters in near future: the risks, benefits, and alternatives have been discussed with the patient in detail. The patient states understanding and desires to proceed. ASA 3 Hold oral diabetes medication night prior and morning of procedure Trilyte prep   Venetia Night, MSN, FNP-BC, AGACNP-BC [facility]

## 2022-03-30 LAB — PHOSPHORUS: Phosphorus: 3.6 mg/dL (ref 3.0–4.3)

## 2022-03-30 LAB — PTH, INTACT AND CALCIUM
Calcium: 9.7 mg/dL (ref 8.7–10.3)
PTH: 30 pg/mL (ref 15–65)

## 2022-03-30 LAB — MAGNESIUM: Magnesium: 2.1 mg/dL (ref 1.6–2.3)

## 2022-04-03 ENCOUNTER — Ambulatory Visit: Payer: BC Managed Care – PPO | Admitting: Gastroenterology

## 2022-04-06 ENCOUNTER — Encounter: Payer: Self-pay | Admitting: "Endocrinology

## 2022-04-06 ENCOUNTER — Ambulatory Visit (INDEPENDENT_AMBULATORY_CARE_PROVIDER_SITE_OTHER): Payer: BC Managed Care – PPO | Admitting: "Endocrinology

## 2022-04-06 VITALS — BP 116/78 | HR 76 | Ht 61.0 in | Wt 165.4 lb

## 2022-04-06 DIAGNOSIS — E212 Other hyperparathyroidism: Secondary | ICD-10-CM | POA: Diagnosis not present

## 2022-04-06 DIAGNOSIS — E1165 Type 2 diabetes mellitus with hyperglycemia: Secondary | ICD-10-CM

## 2022-04-06 DIAGNOSIS — M8000XA Age-related osteoporosis with current pathological fracture, unspecified site, initial encounter for fracture: Secondary | ICD-10-CM | POA: Diagnosis not present

## 2022-04-06 DIAGNOSIS — E782 Mixed hyperlipidemia: Secondary | ICD-10-CM

## 2022-04-06 MED ORDER — POTASSIUM CHLORIDE CRYS ER 20 MEQ PO TBCR: 20.0000 meq | EXTENDED_RELEASE_TABLET | Freq: Every day | ORAL | 0 refills | Status: DC

## 2022-04-06 NOTE — Progress Notes (Signed)
04/06/2022, 12:49 PM  Endocrinology follow-up note   Subjective:    Patient ID: Teresa Soto, female    DOB: Oct 15, 1955.  Teresa Soto is being seen in follow-up after she was seen in consultation for management of primary hyperparathyroidism, type 2 diabetes, osteoporosis, hyperlipidemia:    Past Medical History:  Diagnosis Date   Arthritis of knee, right 08/12/2014   Asthma    Phreesia 06/16/2020   Chronic back pain    Chronic constipation    Depression    Diabetes mellitus without complication ([place])    Phreesia 06/16/2020   History of bronchitis    History of urinary tract infection    Hyperlipidemia    Phreesia 06/16/2020   Hypertension    Meniere disease    vertigo   Neuropathic pain    Osteoporosis    Phreesia 06/16/2020   Parathyroid adenoma    Poor fine motor skills    secondary to CVA per right side    PTSD (post-traumatic stress disorder)    Restless legs syndrome 2007 approx   Seasonal allergies    Shortness of breath dyspnea    coldness    Stroke ([place]) 2015    Past Surgical History:  Procedure Laterality Date   BACK SURGERY  2012   L1-L2  x2   CESAREAN SECTION     x 2   PARATHYROIDECTOMY N/A 06/27/2016   Procedure: MINIMALLY INVASIVE PARATHYROIDECTOMY;  Surgeon: Greer Pickerel, MD;  Location: [facility];  Service: General;  Laterality: N/A;   PARATHYROIDECTOMY Right 02/09/2022   Procedure: PARATHYROIDECTOMY;  Surgeon: Kinsinger, Arta Bruce, MD;  Location: [facility];  Service: General;  Laterality: Right;   SHOULDER ARTHROSCOPY W/ ROTATOR CUFF REPAIR  2010   to right shoulder   TONSILLECTOMY     TOTAL KNEE ARTHROPLASTY Right 08/12/2014   Procedure: RIGHT TOTAL KNEE ARTHROPLASTY;  Surgeon: Carole Civil, MD;  Location: [facility];  Service: Orthopedics;  Laterality: Right;   TUBAL LIGATION  1999    Social History   Socioeconomic History   Marital status: Married     Spouse name: Not on file   Number of children: Not on file   Years of education: Not on file   Highest education level: Not on file  Occupational History   Not on file  Tobacco Use   Smoking status: Former    Packs/day: 2.00    Years: 6.00    Total pack years: 12.00    Types: Cigarettes    Quit date: 10/09/1982    Years since quitting: 39.5   Smokeless tobacco: Never   Tobacco comments:    quit in 1984  Vaping Use   Vaping Use: Former  Substance and Sexual Activity   Alcohol use: Yes    Comment: 4oz daily   Drug use: No   Sexual activity: Yes    Birth control/protection: Surgical  Other Topics Concern   Not on file  Social History Narrative   Not on file   Social Determinants of Health   Financial Resource Strain: Not on file  Food Insecurity: Not on file  Transportation Needs:  Not on file  Physical Activity: Not on file  Stress: Not on file  Social Connections: Not on file    Family History  Problem Relation Age of Onset   Hypertension Mother    Hyperlipidemia Mother    Dementia Paternal Grandmother     Outpatient Encounter Medications as of 04/06/2022  Medication Sig   potassium chloride SA (KLOR-CON M) 20 MEQ tablet Take 1 tablet (20 mEq total) by mouth daily.   albuterol (VENTOLIN HFA) 108 (90 Base) MCG/ACT inhaler Inhale 2 puffs into the lungs every 6 (six) hours as needed for wheezing or shortness of breath.   alendronate (FOSAMAX) 70 MG tablet Take 1 tablet (70 mg total) by mouth every 7 (seven) days. Take with a full glass of water on an empty stomach.   aspirin 81 MG EC tablet Take by mouth.   Blood Glucose Monitoring Suppl (BLOOD GLUCOSE SYSTEM PAK) KIT Please dispense based on patient and insurance preference. Use as directed to monitor FSBS 1x daily. Dx: E11.9.   cetirizine (ZYRTEC) 10 MG tablet Take 10 mg by mouth daily. (Patient not taking: Reported on 04/06/2022)   Cholecalciferol 125 MCG (5000 UT) capsule Take 5,000 Units by mouth daily.    empagliflozin (JARDIANCE) 10 MG TABS tablet Take 1 tablet (10 mg total) by mouth daily.   escitalopram (LEXAPRO) 20 MG tablet Take 1 tablet (20 mg total) by mouth daily.   fenofibrate (TRICOR) 145 MG tablet Take 1 tablet (145 mg total) by mouth daily.   ferrous sulfate 325 (65 FE) MG EC tablet Take 325 mg by mouth every morning.   gabapentin (NEURONTIN) 300 MG capsule Take 1 capsule (300 mg total) by mouth 2 (two) times daily.   ibuprofen (ADVIL) 200 MG tablet Take 200 mg by mouth in the morning and at bedtime.   ibuprofen (ADVIL) 800 MG tablet Take 1 tablet (800 mg total) by mouth every 8 (eight) hours as needed.   ketoconazole (NIZORAL) 2 % shampoo Apply 1 application topically 2 (two) times a week.   Lancet Devices MISC Please dispense based on patient and insurance preference. Use as directed to monitor FSBS 1x daily. Dx: E11.9.   Lancets MISC Please dispense based on patient and insurance preference. Use as directed to monitor FSBS 1x daily. Dx: E11.9.   lisinopril (ZESTRIL) 20 MG tablet Take 20 mg by mouth daily.   meclizine (ANTIVERT) 25 MG tablet Take 25 mg by mouth 3 (three) times daily as needed for dizziness.   montelukast (SINGULAIR) 10 MG tablet Take 1 tablet (10 mg total) by mouth every evening.   ondansetron (ZOFRAN) 4 MG tablet Take 4 mg by mouth every 8 (eight) hours as needed for vomiting or nausea.   polyethylene glycol powder (GLYCOLAX/MIRALAX) 17 GM/SCOOP powder Take 17 g by mouth every other day.   pramipexole (MIRAPEX) 0.25 MG tablet Take 1 tablet (0.25 mg total) by mouth daily with breakfast.   No facility-administered encounter medications on file as of 04/06/2022.    ALLERGIES: Allergies  Allergen Reactions   Fluticasone     headache   Pneumococcal Vaccine Other (See Comments)    Had rash, given both flu and PNA at same time    Pneumococcal Vaccines     Had rash, given both flu and PNA at same time    Statins Other (See Comments)   Influenza Vaccines Rash    Other Nausea And Vomiting    Peppers     VACCINATION STATUS: Immunization History  Administered  Date(s) Administered   Influenza Whole 06/28/2010   PFIZER(Purple Top)SARS-COV-2 Vaccination 03/09/2020, 04/08/2020   Td 03/30/2010   Tdap 01/02/2013    Diabetes She presents for her follow-up diabetic visit. She has type 2 diabetes mellitus. Her disease course has been stable. There are no hypoglycemic associated symptoms. Pertinent negatives for hypoglycemia include no confusion, headaches, pallor or seizures. Pertinent negatives for diabetes include no chest pain, no fatigue, no polydipsia, no polyphagia and no polyuria. There are no hypoglycemic complications. Symptoms are stable. Diabetic complications include a CVA and nephropathy. Risk factors for coronary artery disease include dyslipidemia, diabetes mellitus, hypertension, post-menopausal, obesity, family history, tobacco exposure and sedentary lifestyle. Current diabetic treatment includes oral agent (monotherapy). Her weight is fluctuating minimally. (She did not bring any logs nor meter with her.  She reports her blood glucose ranges from 100-145.  Her recent A1c was 6.7%.  She is taking Jardiance 10 mg p.o. daily at breakfast.    ) An ACE inhibitor/angiotensin II receptor blocker is being taken.  Hyperlipidemia This is a chronic problem. The current episode started more than 1 year ago. Exacerbating diseases include diabetes and obesity. Pertinent negatives include no chest pain, myalgias or shortness of breath. Current antihyperlipidemic treatment includes fibric acid derivatives. Risk factors for coronary artery disease include diabetes mellitus, dyslipidemia, hypertension, a sedentary lifestyle, post-menopausal, family history and obesity.   Primary hyperparathyroidism: After appropriate work-up, she was sent for parathyroidectomy.  She did have this procedure done by Dr. Harlow Asa on Feb 09, 2022.  Right superior gland was removed,  surgical pathology confirming an adenoma.   Review of Systems  Constitutional:  Negative for chills, fatigue, fever and unexpected weight change.  HENT:  Negative for trouble swallowing and voice change.   Eyes:  Negative for visual disturbance.  Respiratory:  Negative for cough, shortness of breath and wheezing.   Cardiovascular:  Negative for chest pain, palpitations and leg swelling.  Gastrointestinal:  Negative for diarrhea, nausea and vomiting.  Endocrine: Negative for cold intolerance, heat intolerance, polydipsia, polyphagia and polyuria.  Musculoskeletal:  Negative for arthralgias and myalgias.  Skin:  Negative for color change, pallor, rash and wound.  Neurological:  Negative for seizures and headaches.  Psychiatric/Behavioral:  Negative for confusion and suicidal ideas.     Objective:       04/06/2022   10:11 AM 02/10/2022    5:30 AM 02/10/2022    1:40 AM  Vitals with BMI  Height '5\' 1"'     Weight 165 lbs 6 oz    BMI 03.50    Systolic 093 818 299  Diastolic 78 69 65  Pulse 76 75 92    BP 116/78   Pulse 76   Ht '5\' 1"'  (1.549 m)   Wt 165 lb 6.4 oz (75 kg)   BMI 31.25 kg/m   Wt Readings from Last 3 Encounters:  04/06/22 165 lb 6.4 oz (75 kg)  02/09/22 162 lb 0.6 oz (73.5 kg)  01/27/22 162 lb (73.5 kg)         CMP ( most recent) CMP     Component Value Date/Time   NA 140 02/10/2022 0433   NA 142 01/09/2022 1119   K 3.3 (L) 02/10/2022 0433   CL 112 (H) 02/10/2022 0433   CO2 20 (L) 02/10/2022 0433   GLUCOSE 103 (H) 02/10/2022 0433   GLUCOSE 77 05/23/2017 0946   BUN 23 02/10/2022 0433   BUN 17 01/09/2022 1119   CREATININE 0.99 02/10/2022 0433   CREATININE  1.18 (H) 06/17/2020 1117   CALCIUM 9.7 03/29/2022 1332   PROT 6.8 01/09/2022 1119   ALBUMIN 4.3 01/09/2022 1119   AST 14 01/09/2022 1119   ALT 15 01/09/2022 1119   ALKPHOS 45 01/09/2022 1119   BILITOT <0.2 01/09/2022 1119   GFRNONAA >60 02/10/2022 0433   GFRNONAA 49 (L) 06/17/2020 1117   GFRAA 57  (L) 06/17/2020 1117     Diabetic Labs (most recent): Lab Results  Component Value Date   HGBA1C 6.7 (H) 02/09/2022   HGBA1C 6.6 (H) 01/09/2022   HGBA1C 6.7 (H) 08/16/2021   MICROALBUR 2.0 06/17/2020   MICROALBUR 0.8 08/05/2018   MICROALBUR 1.8 03/28/2017     Lipid Panel ( most recent) Lipid Panel     Component Value Date/Time   CHOL 182 01/09/2022 1119   TRIG 125 01/09/2022 1119   HDL 40 01/09/2022 1119   CHOLHDL 4.6 (H) 01/09/2022 1119   CHOLHDL 3.8 06/17/2020 1117   VLDL 47 (H) 03/28/2017 0840   LDLCALC 119 (H) 01/09/2022 1119   LDLCALC 108 (H) 06/17/2020 1117   LABVLDL 23 01/09/2022 1119      Lab Results  Component Value Date   TSH 0.67 06/17/2020   TSH 1.48 11/22/2016   TSH 0.798 07/28/2015   TSH 0.488 11/07/2011   TSH 1.102 12/03/2008   TSH 1.102 12/03/2008   TSH 1.350 10/22/2007   FREET4 1.2 06/17/2020           Assessment & Plan:   1. Type 2 diabetes mellitus with stage 3a chronic kidney disease, without long-term current use of insulin (HCC)   - Teresa Soto has currently uncontrolled symptomatic type 2 DM several years ago.  Her most recent A1c 6.7%.    - I had a long discussion with her about the progressive nature of diabetes and the pathology behind its complications. -her diabetes is complicated by CVA, CKD and she remains at a high risk for more acute and chronic complications which include CAD, CVA, CKD, retinopathy, and neuropathy. These are all discussed in detail with her.  - I discussed all available options of managing her diabetes including de-escalation of medications.   She will benefit from lifestyle medicine:  - she acknowledges that there is a room for improvement in her food and drink choices. - Suggestion is made for her to avoid simple carbohydrates  from her diet including Cakes, Sweet Desserts, Ice Cream, Soda (diet and regular), Sweet Tea, Candies, Chips, Cookies, Store Bought Juices, Alcohol , Artificial Sweeteners,   Coffee Creamer, and "Sugar-free" Products, Lemonade. This will help patient to have more stable blood glucose profile and potentially avoid unintended weight gain.  The following Lifestyle Medicine recommendations according to [facility]  [facility]) were discussed and and offered to patient and she  agrees to start the journey:  A. Whole Foods, Plant-Based Nutrition comprising of fruits and vegetables, plant-based proteins, whole-grain carbohydrates was discussed in detail with the patient.   A list for source of those nutrients were also provided to the patient.  Patient will use only water or unsweetened tea for hydration. B.  The need to stay away from risky substances including alcohol, smoking; obtaining 7 to 9 hours of restorative sleep, at least 150 minutes of moderate intensity exercise weekly, the importance of healthy social connections,  and stress management techniques were discussed. C.  A full color page of  Calorie density of various food groups per pound showing examples of each food groups  was provided to the patient.  She is advised to continue Jardiance 10 mg p.o. daily at breakfast.  -She has several options to add if she loses control of diabetes.   2) hyperparathyroidism/hypercalcemia -This patient was previously seen in this clinic.  In 2017, she underwent left inferior parathyroidectomy for hyperparathyroidism/hypercalcemia complicated by osteoporosis, history of nephrolithiasis.    She did have recurrent disease, recently underwent right superior gland removal with surgical pathology confirming parathyroid adenoma.  Her surgical wound is healing properly Her subsequent PTH/calcium are stable and within normal range.  3) osteoporosis: She is on alendronate 70 mg p.o. weekly.  She continues to tolerate this medication.  She will have repeat bone density before her next visit.    4) Blood Pressure /Hypertension:  her blood pressure is  controlled  to target.   she is advised to continue her current medications including lisinopril 20 mg p.o. daily with breakfast .  5) Lipids/Hyperlipidemia:   Review of her recent lipid panel showed uncontrolled  LDL at 97 .  she  is advised to continue    fenofibrate 145 mg p.o. daily.  She reports intolerance to statins.  Whole food plant-based diet discussed above will help with dyslipidemia.     - she is  advised to maintain close follow up with Ivy Lynn, NP for primary care needs, as well as her other providers for optimal and coordinated care.    I spent 32 minutes in the care of the patient today including review of labs from [place], Lipids, Thyroid Function, Hematology (current and previous including abstractions from other facilities); face-to-face time discussing  her blood glucose readings/logs, discussing hypoglycemia and hyperglycemia episodes and symptoms, medications doses, her options of short and long term treatment based on the latest standards of care / guidelines;  discussion about incorporating lifestyle medicine;  and documenting the encounter. Risk reduction counseling performed per USPSTF guidelines to reduce obesity and cardiovascular risk factors.     Please refer to Patient Instructions for Blood Glucose Monitoring and Insulin/Medications Dosing Guide"  in media tab for additional information. Please  also refer to " Patient Self Inventory" in the Media  tab for reviewed elements of pertinent patient history.  Teresa Soto participated in the discussions, expressed understanding, and voiced agreement with the above plans.  All questions were answered to her satisfaction. she is encouraged to contact clinic should she have any questions or concerns prior to her return visit.    Follow up plan: - Return in about 6 months (around 10/06/2022) for F/U with Pre-visit Labs, DXA Scan B4 NV.  Glade Lloyd, MD [facility] [address] [place], [place] [address] Phone: [phone]  Fax: [phone]    04/06/2022, 12:49 PM  This note was partially dictated with voice recognition software. Similar sounding words can be transcribed inadequately or may not  be corrected upon review.

## 2022-04-06 NOTE — Patient Instructions (Signed)

## 2022-04-13 ENCOUNTER — Encounter: Payer: Self-pay | Admitting: *Deleted

## 2022-04-24 ENCOUNTER — Other Ambulatory Visit: Payer: Self-pay | Admitting: Nurse Practitioner

## 2022-04-24 ENCOUNTER — Other Ambulatory Visit: Payer: Self-pay | Admitting: *Deleted

## 2022-04-24 DIAGNOSIS — F339 Major depressive disorder, recurrent, unspecified: Secondary | ICD-10-CM

## 2022-04-24 DIAGNOSIS — I10 Essential (primary) hypertension: Secondary | ICD-10-CM

## 2022-04-24 MED ORDER — ESCITALOPRAM OXALATE 20 MG PO TABS: 20.0000 mg | ORAL_TABLET | Freq: Every day | ORAL | 1 refills | Status: DC

## 2022-04-24 MED ORDER — FENOFIBRATE 145 MG PO TABS: 145.0000 mg | ORAL_TABLET | Freq: Every day | ORAL | 1 refills | Status: DC

## 2022-05-10 ENCOUNTER — Encounter: Payer: Self-pay | Admitting: Nurse Practitioner

## 2022-05-10 ENCOUNTER — Telehealth (INDEPENDENT_AMBULATORY_CARE_PROVIDER_SITE_OTHER): Payer: BC Managed Care – PPO | Admitting: Nurse Practitioner

## 2022-05-10 DIAGNOSIS — E782 Mixed hyperlipidemia: Secondary | ICD-10-CM | POA: Diagnosis not present

## 2022-05-10 DIAGNOSIS — I10 Essential (primary) hypertension: Secondary | ICD-10-CM | POA: Diagnosis not present

## 2022-05-10 DIAGNOSIS — M129 Arthropathy, unspecified: Secondary | ICD-10-CM | POA: Diagnosis not present

## 2022-05-10 DIAGNOSIS — E119 Type 2 diabetes mellitus without complications: Secondary | ICD-10-CM | POA: Diagnosis not present

## 2022-05-10 NOTE — Assessment & Plan Note (Signed)
Hypertension well controlled on current medication no changes neccessary, follow up in 3 months, labs pended.

## 2022-05-10 NOTE — Assessment & Plan Note (Signed)
Diabetes well controlled A1c is bellow 7, continue diabetic diet, low sodium diet, hydration weigh loss and exercise, follow up in 3 months

## 2022-05-10 NOTE — Assessment & Plan Note (Signed)
Elevated LDL, education provided to patient to reduce high cholesterol diet, incorporate more vegetables, and lower fat and saturated fats in diet.

## 2022-05-10 NOTE — Progress Notes (Signed)
Virtual Visit  Note Due to COVID-19 pandemic this visit was conducted virtually. This visit type was conducted due to national recommendations for restrictions regarding the COVID-19 Pandemic (e.g. social distancing, sheltering in place) in an effort to limit this patient's exposure and mitigate transmission in our community. All issues noted in this document were discussed and addressed.  A physical exam was not performed with this format.  I connected with Teresa Soto on 05/10/22 at 3;45 pm  by telephone and verified that I am speaking with the correct person using two identifiers. Teresa Soto is currently located at home during visit. The provider, Ivy Lynn, NP is located in their office at time of visit.  I discussed the limitations, risks, security and privacy concerns of performing an evaluation and management service by telephone and the availability of in person appointments. I also discussed with the patient that there may be a patient responsible charge related to this service. The patient expressed understanding and agreed to proceed.   History and Present Illness:  HPI  Pt presents for follow up of hypertension. Patient was diagnosed in 2007. The patient is tolerating the medication well without side effects. Compliance with treatment has been good; including taking medication as directed , maintains a healthy diet and regular exercise regimen , and following up as directed.    Mixed hyperlipidemia  Pt presents with hyperlipidemia. Patient was diagnosed in 2009. Compliance with treatment has been good The patient is compliant with medications, maintains a low cholesterol diet , follows up as directed , and maintains an exercise regimen . The patient denies experiencing any hypercholesterolemia related symptoms.  LDL is still elevated   Hypothyroidism Teresa Soto is a 66 y.o. female who presents for follow up of hypothyroidism. Current symptoms: none . Patient  denies diarrhea, heat / cold intolerance, and nervousness. Symptoms have been well-controlled.    Review of Systems  Constitutional: Negative.   HENT: Negative.    Eyes: Negative.   Respiratory: Negative.    Cardiovascular: Negative.   Gastrointestinal:  Negative for blood in stool.  Musculoskeletal:  Positive for joint pain and myalgias.  Skin: Negative.   All other systems reviewed and are negative.    Observations/Objective: Tele-visit patient is not in distress  Assessment and Plan: Essential hypertension Hypertension well controlled on current medication no changes neccessary, follow up in 3 months, labs pended.   Diabetes mellitus without complication ([place]) Diabetes well controlled A1c is bellow 7, continue diabetic diet, low sodium diet, hydration weigh loss and exercise, follow up in 3 months  Mixed hyperlipidemia Elevated LDL, education provided to patient to reduce high cholesterol diet, incorporate more vegetables, and lower fat and saturated fats in diet.    Follow Up Instructions: Follow up in 3 months.     I discussed the assessment and treatment plan with the patient. The patient was provided an opportunity to ask questions and all were answered. The patient agreed with the plan and demonstrated an understanding of the instructions.   The patient was advised to call back or seek an in-person evaluation if the symptoms worsen or if the condition fails to improve as anticipated.  The above assessment and management plan was discussed with the patient. The patient verbalized understanding of and has agreed to the management plan. Patient is aware to call the clinic if symptoms persist or worsen. Patient is aware when to return to the clinic for a follow-up visit. Patient educated on when it  is appropriate to go to the emergency department.   Time call ended:    I provided 20 minutes of  non face-to-face time during this encounter.    Ivy Lynn, NP

## 2022-05-12 ENCOUNTER — Ambulatory Visit: Payer: BC Managed Care – PPO | Admitting: Nurse Practitioner

## 2022-05-19 DIAGNOSIS — R42 Dizziness and giddiness: Secondary | ICD-10-CM | POA: Diagnosis not present

## 2022-05-19 DIAGNOSIS — H9041 Sensorineural hearing loss, unilateral, right ear, 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: Secondary | ICD-10-CM | POA: Diagnosis not present

## 2022-05-19 DIAGNOSIS — H6522 Chronic serous otitis media, left ear: Secondary | ICD-10-CM | POA: Diagnosis not present

## 2022-05-19 DIAGNOSIS — H6982 Other specified disorders of Eustachian tube, left ear: Secondary | ICD-10-CM | POA: Diagnosis not present

## 2022-05-23 ENCOUNTER — Telehealth: Payer: Self-pay

## 2022-05-23 NOTE — Chronic Care Management (AMB) (Signed)
  Care Management   Outreach Note  05/23/2022 Name: Teresa Soto MRN: [id number] DOB: Mar 08, 1956  An unsuccessful telephone outreach was attempted today. The patient was referred to the case management team for assistance with care management and care coordination.   Follow Up Plan:  A HIPAA compliant phone message was left for the patient providing contact information and requesting a return call.  The care management team will reach out to the patient again over the next 7 days.  If patient returns call to provider office, please advise to call [place] * at [phone]*  Noreene Larsson, [place], [place] [address] Direct Dial: [phone] [email or web address]

## 2022-05-24 ENCOUNTER — Other Ambulatory Visit: Payer: Self-pay | Admitting: "Endocrinology

## 2022-05-31 NOTE — Chronic Care Management (AMB) (Signed)
  Care Coordination  Note  05/31/2022 Name: Teresa Soto MRN: [id number] DOB: 1956-06-25  Teresa Soto is a 66 y.o. year old female who is a primary care patient of Ivy Lynn, NP. I reached out to Aris Everts by phone today to offer care coordination services.      [name] was given information about Care Coordination services today including:  The Care Coordination services include support from the care team which includes your Nurse Coordinator, Clinical Social Worker, or Pharmacist.  The Care Coordination team is here to help remove barriers to the health concerns and goals most important to you. Care Coordination services are voluntary and the patient may decline or stop services at any time by request to their care team member.   Patient agreed to services and verbal consent obtained.   Follow up plan: Telephone appointment with care coordination team member scheduled for:06/21/2022  Noreene Larsson, [place], [place] [address] Direct Dial: [phone] [email or web address]

## 2022-06-21 ENCOUNTER — Encounter: Payer: Self-pay | Admitting: *Deleted

## 2022-06-21 ENCOUNTER — Ambulatory Visit: Payer: Self-pay | Admitting: *Deleted

## 2022-06-21 NOTE — Patient Instructions (Signed)
Visit Information  Thank you for taking time to visit with me today. Please don't hesitate to contact me if I can be of assistance to you.   Following are the goals we discussed today:   Goals Addressed               This Visit's Progress     COMPLETED: Answer Questions Regarding Medicare Coverage through [facility]. (pt-stated)   On track     Care Coordination Interventions:   Provided Patient with Educational Materials Related to Medicare Coverage through [facility].  Screening for Signs and Symptoms of Depression Related to Chronic Disease State - None Present. Assessed Social Determinant of Health Barriers. Solution-Focused Strategies Employed. Active Listening/Reflection Utilized.  Problem Solving/Task-Centered Strategies Reviewed.        Please call the care guide team at [phone] if you need to cancel or reschedule your appointment.   If you are experiencing a Mental Health or [place] or need someone to talk to, please call the Suicide and Crisis Lifeline: 988 call the [place] National Suicide Prevention Lifeline: 3072346681 or TTY: 409-589-0286 TTY 403 496 1700) to talk to a trained counselor call 1-800-273-TALK (toll free, 24 hour hotline) go to [facility] [address] [place] [phone]) call the [place]: [phone] call 911  Patient verbalizes understanding of instructions and care plan provided today and agrees to view in [place]. Active MyChart status and patient understanding of how to access instructions and care plan via MyChart confirmed with patient.     No further follow up required.  Nat Christen, BSW, MSW, LCSW  Licensed Education officer, [facility]  Mailing [place] [address] [place], [place] [address] Physical Address-[address], [place], [place] [address] Toll Free Main  # [phone] Fax # [phone] Cell # [phone] [email or web address]

## 2022-06-21 NOTE — Patient Outreach (Signed)
  Care Coordination   Initial Visit Note   06/21/2022  Name: Teresa Soto MRN: [id number] DOB: [birth year]  Teresa Soto is a 66 y.o. year old female who sees Teresa Lynn, NP for primary care. I spoke with Teresa Soto by phone today.  What matters to the patients health and wellness today?  Answer Questions Regarding Medicare Coverage through [facility].    Goals Addressed               This Visit's Progress     COMPLETED: Answer Questions Regarding Medicare Coverage through [facility]. (pt-stated)   On track     Care Coordination Interventions:   Provided Patient with Educational Materials Related to Medicare Coverage through [facility].  Screening for Signs and Symptoms of Depression Related to Chronic Disease State - None Present. Assessed Social Determinant of Health Barriers. Solution-Focused Strategies Employed. Active Listening/Reflection Utilized.  Problem Solving/Task-Centered Strategies Reviewed.         SDOH assessments and interventions completed:  Yes.    SDOH Interventions Today    Flowsheet Row Most Recent Value  SDOH Interventions   Food Insecurity Interventions Intervention Not Indicated  Housing Interventions Intervention Not Indicated  Transportation Interventions Intervention Not Indicated  Utilities Interventions Intervention Not Indicated  Alcohol Usage Interventions Intervention Not Indicated (Score <7)  Financial Strain Interventions Intervention Not Indicated  Physical Activity Interventions Patient Refused  Stress Interventions Intervention Not Indicated  Social Connections Interventions Intervention Not Indicated        Care Coordination Interventions Activated:  Yes.   Care Coordination Interventions:  Yes, provided.   Follow up plan: No further intervention required.   Encounter Outcome:  Pt. Visit Completed.   Teresa Soto, BSW, MSW, LCSW  Licensed Regulatory affairs officer [facility]  Mailing [place] [address] [place], [place] [address] Physical Address-[address] [place], [place] [address] Toll Free Main # [phone] Fax # [phone] Cell # [phone] [email or web address]

## 2022-07-23 ENCOUNTER — Other Ambulatory Visit: Payer: Self-pay | Admitting: "Endocrinology

## 2022-07-23 ENCOUNTER — Other Ambulatory Visit: Payer: Self-pay | Admitting: Nurse Practitioner

## 2022-07-23 DIAGNOSIS — E119 Type 2 diabetes mellitus without complications: Secondary | ICD-10-CM

## 2022-08-07 DIAGNOSIS — H524 Presbyopia: Secondary | ICD-10-CM | POA: Diagnosis not present

## 2022-09-21 ENCOUNTER — Other Ambulatory Visit: Payer: Self-pay | Admitting: "Endocrinology

## 2022-09-21 ENCOUNTER — Other Ambulatory Visit: Payer: Self-pay | Admitting: Nurse Practitioner

## 2022-10-11 ENCOUNTER — Ambulatory Visit: Payer: BC Managed Care – PPO | Admitting: "Endocrinology

## 2022-10-21 ENCOUNTER — Other Ambulatory Visit: Payer: Self-pay | Admitting: Nurse Practitioner

## 2022-10-21 DIAGNOSIS — F339 Major depressive disorder, recurrent, unspecified: Secondary | ICD-10-CM

## 2022-10-21 DIAGNOSIS — I10 Essential (primary) hypertension: Secondary | ICD-10-CM

## 2022-10-21 DIAGNOSIS — E119 Type 2 diabetes mellitus without complications: Secondary | ICD-10-CM

## 2022-10-23 ENCOUNTER — Other Ambulatory Visit: Payer: Self-pay | Admitting: *Deleted

## 2022-10-23 DIAGNOSIS — I10 Essential (primary) hypertension: Secondary | ICD-10-CM

## 2022-10-23 DIAGNOSIS — E119 Type 2 diabetes mellitus without complications: Secondary | ICD-10-CM

## 2022-10-23 DIAGNOSIS — F339 Major depressive disorder, recurrent, unspecified: Secondary | ICD-10-CM

## 2022-10-23 DIAGNOSIS — Z6831 Body mass index (BMI) 31.0-31.9, adult: Secondary | ICD-10-CM | POA: Diagnosis not present

## 2022-10-23 DIAGNOSIS — M5416 Radiculopathy, lumbar region: Secondary | ICD-10-CM | POA: Diagnosis not present

## 2022-10-23 NOTE — Telephone Encounter (Signed)
Je pt NTBS by new provider. NTBS for 3 mos FU which was to be in Dec. RF not sent to mail order pharmacy

## 2022-11-03 ENCOUNTER — Other Ambulatory Visit (HOSPITAL_COMMUNITY): Payer: BC Managed Care – PPO

## 2022-11-07 ENCOUNTER — Telehealth: Payer: Self-pay

## 2022-11-07 ENCOUNTER — Telehealth: Payer: Self-pay | Admitting: "Endocrinology

## 2022-11-07 DIAGNOSIS — I10 Essential (primary) hypertension: Secondary | ICD-10-CM

## 2022-11-07 DIAGNOSIS — E212 Other hyperparathyroidism: Secondary | ICD-10-CM

## 2022-11-07 DIAGNOSIS — E782 Mixed hyperlipidemia: Secondary | ICD-10-CM

## 2022-11-07 DIAGNOSIS — E119 Type 2 diabetes mellitus without complications: Secondary | ICD-10-CM

## 2022-11-07 DIAGNOSIS — E1165 Type 2 diabetes mellitus with hyperglycemia: Secondary | ICD-10-CM

## 2022-11-07 MED ORDER — FENOFIBRATE 145 MG PO TABS: 145.0000 mg | ORAL_TABLET | Freq: Every day | ORAL | 0 refills | Status: DC

## 2022-11-07 MED ORDER — EMPAGLIFLOZIN 10 MG PO TABS: 10.0000 mg | ORAL_TABLET | Freq: Every day | ORAL | 0 refills | Status: DC

## 2022-11-07 NOTE — Telephone Encounter (Signed)
Please update lab order. Pt is asking would like to know if you could refill her Jardiance and Fenofibrate because this is the 2nd time her PCP has left and she is currently looking for a new one. She uses [facility] if they does not work then send to [facility].

## 2022-11-07 NOTE — Telephone Encounter (Signed)
Spoke with pt, understanding voiced. Rx refills for jardiance and fenofibrate sent to [facility]. Lab orders updated and sent to [facility].

## 2022-11-07 NOTE — Telephone Encounter (Signed)
Will update lab orders, requesting approval to refill pt's jardiance and fenofibrate.

## 2022-11-07 NOTE — Telephone Encounter (Signed)
Left a message requesting pt return call to the office. ?

## 2022-11-08 ENCOUNTER — Ambulatory Visit (HOSPITAL_COMMUNITY)
Admission: RE | Admit: 2022-11-08 | Discharge: 2022-11-08 | Disposition: A | Discharge status: Home / Self Care | Payer: Medicare Other | Source: Ambulatory Visit | Attending: "Endocrinology | Admitting: "Endocrinology

## 2022-11-08 ENCOUNTER — Ambulatory Visit: Payer: BC Managed Care – PPO | Admitting: "Endocrinology

## 2022-11-08 DIAGNOSIS — M85832 Other specified disorders of bone density and structure, left forearm: Secondary | ICD-10-CM | POA: Diagnosis not present

## 2022-11-08 DIAGNOSIS — M85852 Other specified disorders of bone density and structure, left thigh: Secondary | ICD-10-CM | POA: Diagnosis not present

## 2022-11-08 DIAGNOSIS — Z780 Asymptomatic menopausal state: Secondary | ICD-10-CM | POA: Diagnosis not present

## 2022-11-08 DIAGNOSIS — M8000XA Age-related osteoporosis with current pathological fracture, unspecified site, initial encounter for fracture: Secondary | ICD-10-CM | POA: Diagnosis not present

## 2022-11-08 DIAGNOSIS — M810 Age-related osteoporosis without current pathological fracture: Secondary | ICD-10-CM | POA: Diagnosis not present

## 2022-11-08 DIAGNOSIS — M8589 Other specified disorders of bone density and structure, multiple sites: Secondary | ICD-10-CM | POA: Diagnosis not present

## 2022-11-15 DIAGNOSIS — M47816 Spondylosis without myelopathy or radiculopathy, lumbar region: Secondary | ICD-10-CM | POA: Diagnosis not present

## 2022-11-15 DIAGNOSIS — M5416 Radiculopathy, lumbar region: Secondary | ICD-10-CM | POA: Diagnosis not present

## 2022-11-15 DIAGNOSIS — M48061 Spinal stenosis, lumbar region without neurogenic claudication: Secondary | ICD-10-CM | POA: Diagnosis not present

## 2022-11-23 ENCOUNTER — Ambulatory Visit (INDEPENDENT_AMBULATORY_CARE_PROVIDER_SITE_OTHER): Payer: Medicare Other | Admitting: Internal Medicine

## 2022-11-23 ENCOUNTER — Encounter: Payer: Self-pay | Admitting: Internal Medicine

## 2022-11-23 VITALS — BP 138/79 | HR 89 | Ht 61.0 in | Wt 166.8 lb

## 2022-11-23 DIAGNOSIS — E782 Mixed hyperlipidemia: Secondary | ICD-10-CM

## 2022-11-23 DIAGNOSIS — N1831 Chronic kidney disease, stage 3a: Secondary | ICD-10-CM | POA: Diagnosis not present

## 2022-11-23 DIAGNOSIS — Z8673 Personal history of transient ischemic attack (TIA), and cerebral infarction without residual deficits: Secondary | ICD-10-CM

## 2022-11-23 DIAGNOSIS — J45909 Unspecified asthma, uncomplicated: Secondary | ICD-10-CM

## 2022-11-23 DIAGNOSIS — Z1329 Encounter for screening for other suspected endocrine disorder: Secondary | ICD-10-CM | POA: Diagnosis not present

## 2022-11-23 DIAGNOSIS — E1122 Type 2 diabetes mellitus with diabetic chronic kidney disease: Secondary | ICD-10-CM

## 2022-11-23 DIAGNOSIS — F339 Major depressive disorder, recurrent, unspecified: Secondary | ICD-10-CM

## 2022-11-23 DIAGNOSIS — M1711 Unilateral primary osteoarthritis, right knee: Secondary | ICD-10-CM

## 2022-11-23 DIAGNOSIS — M810 Age-related osteoporosis without current pathological fracture: Secondary | ICD-10-CM

## 2022-11-23 DIAGNOSIS — E559 Vitamin D deficiency, unspecified: Secondary | ICD-10-CM

## 2022-11-23 DIAGNOSIS — Z0001 Encounter for general adult medical examination with abnormal findings: Secondary | ICD-10-CM

## 2022-11-23 DIAGNOSIS — M7918 Myalgia, other site: Secondary | ICD-10-CM

## 2022-11-23 DIAGNOSIS — G8929 Other chronic pain: Secondary | ICD-10-CM

## 2022-11-23 DIAGNOSIS — I10 Essential (primary) hypertension: Secondary | ICD-10-CM | POA: Diagnosis not present

## 2022-11-23 DIAGNOSIS — M8000XA Age-related osteoporosis with current pathological fracture, unspecified site, initial encounter for fracture: Secondary | ICD-10-CM

## 2022-11-23 DIAGNOSIS — F3340 Major depressive disorder, recurrent, in remission, unspecified: Secondary | ICD-10-CM

## 2022-11-23 DIAGNOSIS — M5416 Radiculopathy, lumbar region: Secondary | ICD-10-CM | POA: Diagnosis not present

## 2022-11-23 NOTE — Patient Instructions (Signed)
It was a pleasure to see you today.  Thank you for giving [place] the opportunity to be involved in your care.  Below is a brief recap of your visit and next steps.  We will plan to see you again in 4 weeks.  Summary You have established care today We will check labs and plan for follow up in 4 weeks to review results and discuss necessary medication changes

## 2022-11-23 NOTE — Progress Notes (Signed)
New Patient Office Visit  Subjective    Patient ID: Teresa Soto, female    DOB: [birth year]  Age: 67 y.o. MRN: [id number]  CC:  Chief Complaint  Patient presents with   Establish Care   HPI Teresa Soto presents to establish care.  She is a 67 year old woman who endorses a past medical history significant for HTN, asthma, T2DM, HLD, chronic musculoskeletal pain, remote history of CVA, osteopenia, MDD, and CKD 3A.  She has most recently been followed at [place]. [name] endorses chronic musculoskeletal pain but otherwise reports feeling well today.  She has no acute concerns to discuss today aside from wishing to establish care.  Chronic medical conditions and outstanding preventative care items discussed today are individually addressed in A/P below.  Outpatient Encounter Medications as of 11/23/2022  Medication Sig   albuterol (VENTOLIN HFA) 108 (90 Base) MCG/ACT inhaler Inhale 2 puffs into the lungs every 6 (six) hours as needed for wheezing or shortness of breath.   alendronate (FOSAMAX) 70 MG tablet Take 1 tablet by mouth every 7 days. Take with a full glass of water on an empty stomach.   aspirin 81 MG EC tablet Take by mouth.   Blood Glucose Monitoring Suppl (BLOOD GLUCOSE SYSTEM PAK) KIT Please dispense based on patient and insurance preference. Use as directed to monitor FSBS 1x daily. Dx: E11.9.   cetirizine (ZYRTEC) 10 MG tablet Take 10 mg by mouth daily.   Cholecalciferol 125 MCG (5000 UT) capsule Take 5,000 Units by mouth daily.   empagliflozin (JARDIANCE) 10 MG TABS tablet Take 1 tablet (10 mg total) by mouth daily.   escitalopram (LEXAPRO) 20 MG tablet Take 1 tablet (20 mg total) by mouth daily.   fenofibrate (TRICOR) 145 MG tablet Take 1 tablet (145 mg total) by mouth daily.   ferrous sulfate 325 (65 FE) MG EC tablet Take 325 mg by mouth every morning.   gabapentin (NEURONTIN) 300 MG capsule Take 1 capsule by mouth twice daily.    ibuprofen (ADVIL) 200 MG tablet Take 200 mg by mouth in the morning and at bedtime.   ibuprofen (ADVIL) 800 MG tablet Take 1 tablet (800 mg total) by mouth every 8 (eight) hours as needed.   ketoconazole (NIZORAL) 2 % shampoo Apply 1 application topically 2 (two) times a week.   Lancet Devices MISC Please dispense based on patient and insurance preference. Use as directed to monitor FSBS 1x daily. Dx: E11.9.   Lancets MISC Please dispense based on patient and insurance preference. Use as directed to monitor FSBS 1x daily. Dx: E11.9.   lisinopril (ZESTRIL) 10 MG tablet Take 1 tablet by mouth daily.   lisinopril (ZESTRIL) 20 MG tablet Take 20 mg by mouth daily.   meclizine (ANTIVERT) 25 MG tablet Take 25 mg by mouth 3 (three) times daily as needed for dizziness.   montelukast (SINGULAIR) 10 MG tablet Take 1 tablet by mouth every evening.   ondansetron (ZOFRAN) 4 MG tablet Take 4 mg by mouth every 8 (eight) hours as needed for vomiting or nausea.   polyethylene glycol powder (GLYCOLAX/MIRALAX) 17 GM/SCOOP powder Take 17 g by mouth every other day.   potassium chloride SA (KLOR-CON M) 20 MEQ tablet Take 1 tablet by mouth daily.   pramipexole (MIRAPEX) 0.25 MG tablet Take 1 tablet by mouth daily with breakfast.   No facility-administered encounter medications on file as of 11/23/2022.    Past Medical History:  Diagnosis Date   Allergy  Hayfever, dust, peppers   Arthritis of knee, right 08/12/2014   Asthma    Phreesia 06/16/2020   Chronic back pain    Chronic constipation    Depression    Diabetes mellitus without complication ([place])    Phreesia 06/16/2020   Heart murmur    History of bronchitis    History of urinary tract infection    Hyperlipidemia    Phreesia 06/16/2020   Hypertension    Meniere disease    vertigo   Neuropathic pain    Osteoporosis    Phreesia 06/16/2020   Parathyroid adenoma    Poor fine motor skills    secondary to CVA per right side    PTSD (post-traumatic  stress disorder)    Restless legs syndrome 2007 approx   Seasonal allergies    Shortness of breath dyspnea    coldness    Stroke ([place]) 2015    Past Surgical History:  Procedure Laterality Date   BACK SURGERY  2012   L1-L2  x2   CESAREAN SECTION     x 2   JOINT REPLACEMENT     PARATHYROIDECTOMY N/A 06/27/2016   Procedure: MINIMALLY INVASIVE PARATHYROIDECTOMY;  Surgeon: Teresa Pickerel, MD;  Location: [facility];  Service: General;  Laterality: N/A;   PARATHYROIDECTOMY Right 02/09/2022   Procedure: PARATHYROIDECTOMY;  Surgeon: Soto, Teresa Bruce, MD;  Location: [facility];  Service: General;  Laterality: Right;   SHOULDER ARTHROSCOPY W/ ROTATOR CUFF REPAIR  2010   to right shoulder   SPINE SURGERY  12/2010   TONSILLECTOMY     TOTAL KNEE ARTHROPLASTY Right 08/12/2014   Procedure: RIGHT TOTAL KNEE ARTHROPLASTY;  Surgeon: Teresa Civil, MD;  Location: [facility];  Service: Orthopedics;  Laterality: Right;   TUBAL LIGATION  1999    Family History  Problem Relation Age of Onset   Hypertension Mother    Hyperlipidemia Mother    Alcohol abuse Mother    Arthritis Mother    Alcohol abuse Father    Dementia Paternal Grandmother    Alcohol abuse Maternal Grandfather    Hearing loss Maternal Grandfather    Heart disease Maternal Grandfather    Hypertension Maternal Grandfather    Depression Maternal Grandmother    ADD / ADHD Son    Learning disabilities Son    Alcohol abuse Sister    Arthritis Sister    Alcohol abuse Paternal Uncle    Diabetes Maternal Aunt    Obesity Maternal Aunt     Social History   Socioeconomic History   Marital status: Married    Spouse name: Teresa Soto   Number of children: Not on file   Years of education: 12   Highest education level: 12th grade  Occupational History   Not on file  Tobacco Use   Smoking status: Former    Packs/day: 2.00    Years: 6.00    Total pack years: 12.00    Types: Cigarettes    Quit date: 10/09/1982    Years since  quitting: 40.1    Passive exposure: Past   Smokeless tobacco: Never   Tobacco comments:    quit in 1984  Vaping Use   Vaping Use: Former  Substance and Sexual Activity   Alcohol use: Yes    Alcohol/week: 2.0 standard drinks of alcohol    Types: 1 Glasses of wine, 1 Cans of beer per week   Drug use: No   Sexual activity: Yes    Partners: Male    Birth control/protection:  Post-menopausal, Surgical  Other Topics Concern   Not on file  Social History Narrative   Not on file   Social Determinants of Health   Financial Resource Strain: Low Risk  (06/21/2022)   Overall Financial Resource Strain (CARDIA)    Difficulty of Paying Living Expenses: Not hard at all  Food Insecurity: No Food Insecurity (06/21/2022)   Hunger Vital Sign    Worried About Running Out of Food in the Last Year: Never true    Ran Out of Food in the Last Year: Never true  Transportation Needs: No Transportation Needs (06/21/2022)   PRAPARE - Hydrologist (Medical): No    Lack of Transportation (Non-Medical): No  Physical Activity: Inactive (06/21/2022)   Exercise Vital Sign    Days of Exercise per Week: 0 days    Minutes of Exercise per Session: 0 min  Stress: No Stress Concern Present (06/21/2022)   [place]    Feeling of Stress : Only a little  Social Connections: Socially Integrated (06/21/2022)   Social Connection and Isolation Panel [NHANES]    Frequency of Communication with Friends and Family: More than three times a week    Frequency of Social Gatherings with Friends and Family: More than three times a week    Attends Religious Services: More than 4 times per year    Active Member of [employer] or Organizations: Yes    Attends Music therapist: More than 4 times per year    Marital Status: Married  Human resources officer Violence: Not At Risk (06/21/2022)   Humiliation, Afraid, Rape, and Kick questionnaire     Fear of Current or Ex-Partner: No    Emotionally Abused: No    Physically Abused: No    Sexually Abused: No   Review of Systems  Constitutional:  Negative for chills and fever.  HENT:  Negative for sore throat.   Respiratory:  Negative for cough and shortness of breath.   Cardiovascular:  Negative for chest pain, palpitations and leg swelling.  Gastrointestinal:  Negative for abdominal pain, blood in stool, constipation, diarrhea, nausea and vomiting.  Genitourinary:  Negative for dysuria and hematuria.  Musculoskeletal:  Positive for back pain and joint pain (diffuse joint pain). Negative for myalgias.  Skin:  Negative for itching and rash.  Neurological:  Negative for dizziness and headaches.  Psychiatric/Behavioral:  Negative for depression and suicidal ideas.    Objective    BP 138/79   Pulse 89   Ht 5' 1"$  (1.549 m)   Wt 166 lb 12.8 oz (75.7 kg)   SpO2 94%   BMI 31.52 kg/m   Physical Exam Vitals reviewed.  Constitutional:      General: She is not in acute distress.    Appearance: Normal appearance. She is obese. She is not toxic-appearing.  HENT:     Head: Normocephalic and atraumatic.     Right Ear: External ear normal.     Left Ear: External ear normal.     Nose: Nose normal. No congestion or rhinorrhea.     Mouth/Throat:     Mouth: Mucous membranes are moist.     Pharynx: Oropharynx is clear. No oropharyngeal exudate or posterior oropharyngeal erythema.  Eyes:     General: No scleral icterus.    Extraocular Movements: Extraocular movements intact.     Conjunctiva/sclera: Conjunctivae normal.     Pupils: Pupils are equal, round, and reactive to light.  Cardiovascular:  Rate and Rhythm: Normal rate and regular rhythm.     Pulses: Normal pulses.     Heart sounds: Normal heart sounds. No murmur heard.    No friction rub. No gallop.  Pulmonary:     Effort: Pulmonary effort is normal.     Breath sounds: Normal breath sounds. No wheezing, rhonchi or rales.   Abdominal:     General: Abdomen is flat. Bowel sounds are normal. There is no distension.     Palpations: Abdomen is soft.     Tenderness: There is no abdominal tenderness.  Musculoskeletal:        General: No swelling. Normal range of motion.     Cervical back: Normal range of motion.     Right lower leg: No edema.     Left lower leg: No edema.  Lymphadenopathy:     Cervical: No cervical adenopathy.  Skin:    General: Skin is warm and dry.     Capillary Refill: Capillary refill takes less than 2 seconds.     Coloration: Skin is not jaundiced.  Neurological:     General: No focal deficit present.     Mental Status: She is alert and oriented to person, place, and time.  Psychiatric:        Mood and Affect: Mood normal.        Behavior: Behavior normal.    Assessment & Plan:   Problem List Items Addressed This Visit       Essential hypertension - Primary    She is currently prescribed lisinopril 10 mg daily for treatment of hypertension.  Her blood pressure today was 144/81 initially and 138/79 on repeat. -No medication changes today.  Follow-up in 4 weeks for HTN check.      Asthma    Asymptomatic currently.  Unremarkable pulmonary exam today.  She is currently prescribed an albuterol inhaler for as needed use but states that she has not needed to use her inhaler recently.      Type 2 diabetes mellitus with stage 3a chronic kidney disease, without long-term current use of insulin ([place])    Followed by endocrinology (Dr. Dorris Fetch).  Her most recent A1c was 6.7.  She is currently prescribed Jardiance 10 mg daily. -Repeat labs ordered today, including A1c, urine microalbumin/creatinine ratio, and lipid panel. -Endocrinology follow-up is scheduled for next month (3/5).      Osteoporosis with current pathological fracture    Previously documented history of osteoporosis.  She is currently followed by endocrinology (Dr. Dorris Fetch).  And is prescribed Fosamax 70 mg weekly. -No medication  changes today.  Repeat vitamin D level has been ordered.      Mixed hyperlipidemia    She is currently prescribed fenofibrate 145 mg daily.  She has a previously documented history of statin intolerance. Lipid panel was last updated in April 2023.  Total cholesterol 182 and LDL 119. -No medication changes.  Repeat lipid panel ordered today.      History of cerebrovascular accident    Previously documented history of CVA, reported as a left-sided CVA with residual fine motor symptoms in her right hand.  She is on ASA and has a previously documented history of statin intolerance. -No medication changes today. Will focus on existing risk factor control.      Depression, recurrent ([place])    She is currently prescribed Lexapro 20 mg daily.  Mood is stable.  She denies SI/HI. -No medication changes today       Chronic musculoskeletal pain  She endorses a history of chronic musculoskeletal pain with chronic back pain and diffuse joint pain.  This is secondary to degenerative joint disease, lumbar radiculopathy, and multisite osteoarthritis.  She is currently prescribed gabapentin 300 mg twice daily and is followed by [facility]. -No medication changes today      Return in about 4 weeks (around 12/21/2022) for review labs, medication review.   Johnette Abraham, MD

## 2022-11-25 LAB — CMP14+EGFR
ALT: 13 IU/L (ref 0–32)
AST: 14 IU/L (ref 0–40)
Albumin/Globulin Ratio: 1.8 (ref 1.2–2.2)
Albumin: 4.8 g/dL (ref 3.9–4.9)
Alkaline Phosphatase: 40 IU/L — ABNORMAL LOW (ref 44–121)
BUN/Creatinine Ratio: 24 (ref 12–28)
BUN: 26 mg/dL (ref 8–27)
Bilirubin Total: 0.3 mg/dL (ref 0.0–1.2)
CO2: 20 mmol/L (ref 20–29)
Calcium: 10.7 mg/dL — ABNORMAL HIGH (ref 8.7–10.3)
Chloride: 99 mmol/L (ref 96–106)
Creatinine, Ser: 1.09 mg/dL — ABNORMAL HIGH (ref 0.57–1.00)
Globulin, Total: 2.6 g/dL (ref 1.5–4.5)
Glucose: 182 mg/dL — ABNORMAL HIGH (ref 70–99)
Potassium: 4.4 mmol/L (ref 3.5–5.2)
Sodium: 138 mmol/L (ref 134–144)
Total Protein: 7.4 g/dL (ref 6.0–8.5)
eGFR: 56 mL/min/{1.73_m2} — ABNORMAL LOW (ref >59)

## 2022-11-25 LAB — B12 AND FOLATE PANEL
Folate: 13.7 ng/mL (ref >3.0)
Vitamin B-12: 862 pg/mL (ref 232–1245)

## 2022-11-25 LAB — CBC WITH DIFFERENTIAL/PLATELET
Basophils Absolute: 0.1 10*3/uL (ref 0.0–0.2)
Basos: 1 %
EOS (ABSOLUTE): 0.2 10*3/uL (ref 0.0–0.4)
Eos: 3 %
Hematocrit: 47.3 % — ABNORMAL HIGH (ref 34.0–46.6)
Hemoglobin: 15.3 g/dL (ref 11.1–15.9)
Immature Grans (Abs): 0 10*3/uL (ref 0.0–0.1)
Immature Granulocytes: 0 %
Lymphocytes Absolute: 2.4 10*3/uL (ref 0.7–3.1)
Lymphs: 32 %
MCH: 28 pg (ref 26.6–33.0)
MCHC: 32.3 g/dL (ref 31.5–35.7)
MCV: 87 fL (ref 79–97)
Monocytes Absolute: 0.5 10*3/uL (ref 0.1–0.9)
Monocytes: 7 %
Neutrophils Absolute: 4.2 10*3/uL (ref 1.4–7.0)
Neutrophils: 57 %
Platelets: 347 10*3/uL (ref 150–450)
RBC: 5.46 x10E6/uL — ABNORMAL HIGH (ref 3.77–5.28)
RDW: 13.5 % (ref 11.7–15.4)
WBC: 7.3 10*3/uL (ref 3.4–10.8)

## 2022-11-25 LAB — LIPID PANEL
Chol/HDL Ratio: 4.5 ratio — ABNORMAL HIGH (ref 0.0–4.4)
Cholesterol, Total: 203 mg/dL — ABNORMAL HIGH (ref 100–199)
HDL: 45 mg/dL (ref >39)
LDL Chol Calc (NIH): 128 mg/dL — ABNORMAL HIGH (ref 0–99)
Triglycerides: 170 mg/dL — ABNORMAL HIGH (ref 0–149)
VLDL Cholesterol Cal: 30 mg/dL (ref 5–40)

## 2022-11-25 LAB — MICROALBUMIN / CREATININE URINE RATIO
Creatinine, Urine: 25.4 mg/dL
Microalb/Creat Ratio: 37 mg/g creat — ABNORMAL HIGH (ref 0–29)
Microalbumin, Urine: 9.3 ug/mL

## 2022-11-25 LAB — HEMOGLOBIN A1C
Est. average glucose Bld gHb Est-mCnc: 171 mg/dL
Hgb A1c MFr Bld: 7.6 % — ABNORMAL HIGH (ref 4.8–5.6)

## 2022-11-25 LAB — TSH+FREE T4
Free T4: 1.24 ng/dL (ref 0.82–1.77)
TSH: 0.809 u[IU]/mL (ref 0.450–4.500)

## 2022-11-25 LAB — VITAMIN D 25 HYDROXY (VIT D DEFICIENCY, FRACTURES): Vit D, 25-Hydroxy: 46.6 ng/mL (ref 30.0–100.0)

## 2022-11-28 ENCOUNTER — Other Ambulatory Visit: Payer: Self-pay

## 2022-11-28 ENCOUNTER — Emergency Department (HOSPITAL_COMMUNITY)
Admission: EM | Admit: 2022-11-28 | Discharge: 2022-11-28 | Disposition: A | Discharge status: Home / Self Care | Payer: Medicare Other | Attending: Emergency Medicine | Admitting: Emergency Medicine

## 2022-11-28 ENCOUNTER — Encounter (HOSPITAL_COMMUNITY): Payer: Self-pay | Admitting: *Deleted

## 2022-11-28 ENCOUNTER — Emergency Department (HOSPITAL_COMMUNITY): Payer: Medicare Other

## 2022-11-28 DIAGNOSIS — R109 Unspecified abdominal pain: Secondary | ICD-10-CM

## 2022-11-28 DIAGNOSIS — E119 Type 2 diabetes mellitus without complications: Secondary | ICD-10-CM | POA: Insufficient documentation

## 2022-11-28 DIAGNOSIS — Z7982 Long term (current) use of aspirin: Secondary | ICD-10-CM | POA: Insufficient documentation

## 2022-11-28 DIAGNOSIS — N200 Calculus of kidney: Secondary | ICD-10-CM | POA: Diagnosis not present

## 2022-11-28 DIAGNOSIS — K5730 Diverticulosis of large intestine without perforation or abscess without bleeding: Secondary | ICD-10-CM | POA: Diagnosis not present

## 2022-11-28 DIAGNOSIS — Z79899 Other long term (current) drug therapy: Secondary | ICD-10-CM | POA: Insufficient documentation

## 2022-11-28 DIAGNOSIS — Z794 Long term (current) use of insulin: Secondary | ICD-10-CM | POA: Insufficient documentation

## 2022-11-28 DIAGNOSIS — J45909 Unspecified asthma, uncomplicated: Secondary | ICD-10-CM | POA: Diagnosis not present

## 2022-11-28 DIAGNOSIS — N858 Other specified noninflammatory disorders of uterus: Secondary | ICD-10-CM | POA: Diagnosis not present

## 2022-11-28 DIAGNOSIS — I10 Essential (primary) hypertension: Secondary | ICD-10-CM | POA: Diagnosis not present

## 2022-11-28 DIAGNOSIS — K8689 Other specified diseases of pancreas: Secondary | ICD-10-CM | POA: Diagnosis not present

## 2022-11-28 LAB — URINALYSIS, ROUTINE W REFLEX MICROSCOPIC
Bilirubin Urine: NEGATIVE
Glucose, UA: >=500 mg/dL — AB
Ketones, ur: NEGATIVE mg/dL
Leukocytes,Ua: NEGATIVE
Nitrite: NEGATIVE
Protein, ur: NEGATIVE mg/dL
Specific Gravity, Urine: 1.015 (ref 1.005–1.030)
pH: 6 (ref 5.0–8.0)

## 2022-11-28 LAB — URINALYSIS, MICROSCOPIC (REFLEX): Bacteria, UA: NONE SEEN

## 2022-11-28 NOTE — ED Triage Notes (Signed)
Pt c/o left flank pain, nausea, decreased urine output since 0300. The pain woke her up out of her sleep. Denies fever, vomiting.

## 2022-11-28 NOTE — ED Notes (Signed)
Patient Alert and oriented to baseline. Stable and ambulatory to baseline. Patient verbalized understanding of the discharge instructions.  Patient belongings were taken by the patient.   

## 2022-11-28 NOTE — ED Provider Triage Note (Addendum)
Emergency Medicine Provider Triage Evaluation Note  Teresa Soto , a 67 y.o. female  was evaluated in triage.  Pt complains of left flank pain of sudden onset this morning..  Pain woke her from sleep.  Pain has been associated with nausea and decreased urine output this morning.  States she drank tea and has not urinated since waking up.  She denies any fever, chills, vomiting or abdominal pain.  No pain radiating into her lower extremities or known injury.  History of kidney stones and states current pain feels similar.  Pt had blood work 5 days ago, reviewed in [facility]  Review of Systems  Positive: Left flank pain, decreased urine output, nausea Negative: Pain, shortness of breath, dysuria and abdominal pain  Physical Exam  BP (!) 159/85 (BP Location: Right Arm)   Pulse 86   Temp 98.6 F (37 C) (Oral)   Resp 20   Ht 5' 1"$  (1.549 m)   Wt 75.7 kg   SpO2 97%   BMI 31.52 kg/m  Gen:   Awake, no distress   Resp:  Normal effort  MSK:   Moves extremities without difficulty  Other:  Left flank pain, no CVA tenderness  Medical Decision Making  Medically screening exam initiated at 12:03 PM.  Appropriate orders placed.  Teresa Soto was informed that the remainder of the evaluation will be completed by another provider, this initial triage assessment does not replace that evaluation, and the importance of remaining in the ED until their evaluation is complete.     Kem Parkinson, PA-C 11/28/22 1205    Kem Parkinson, PA-C 11/28/22 1208

## 2022-11-28 NOTE — ED Provider Notes (Signed)
[facility] Provider Note   CSN: [id number] Arrival date & time: 11/28/22  1010     History  Chief Complaint  Patient presents with   Flank Pain    Teresa Soto is a 67 y.o. female.   Flank Pain  Patient presents for left flank pain.  Medical history includes HLD, HTN, asthma, arthritis, depression, T2DM, prior nephrolithiasis..  Onset was earlier this morning.  She also felt that it was difficult to urinate today.  She does have a history of nephrolithiasis that they require stenting in the past.  She has not yet established care with local urology.  Since arriving in the ED, patient has had improved flank pain and has been able to urinate normally.  Currently, left flank pain has totally resolved.     Home Medications Prior to Admission medications   Medication Sig Start Date End Date Taking? Authorizing Provider  albuterol (VENTOLIN HFA) 108 (90 Base) MCG/ACT inhaler Inhale 2 puffs into the lungs every 6 (six) hours as needed for wheezing or shortness of breath. 07/14/21   Ivy Lynn, NP  alendronate (FOSAMAX) 70 MG tablet Take 1 tablet by mouth every 7 days. Take with a full glass of water on an empty stomach. 09/21/22   Cassandria Anger, MD  aspirin 81 MG EC tablet Take by mouth.    [provider]  Blood Glucose Monitoring Suppl (BLOOD GLUCOSE SYSTEM PAK) KIT Please dispense based on patient and insurance preference. Use as directed to monitor FSBS 1x daily. Dx: E11.9. 11/28/16   Alycia Rossetti, MD  cetirizine (ZYRTEC) 10 MG tablet Take 10 mg by mouth daily.    [provider]  Cholecalciferol 125 MCG (5000 UT) capsule Take 5,000 Units by mouth daily.    [provider]  empagliflozin (JARDIANCE) 10 MG TABS tablet Take 1 tablet (10 mg total) by mouth daily. 11/07/22   Cassandria Anger, MD  escitalopram (LEXAPRO) 20 MG tablet Take 1 tablet (20 mg total) by mouth daily. 04/24/22   Ivy Lynn, NP  fenofibrate (TRICOR) 145 MG tablet Take 1 tablet (145 mg total) by mouth daily. 11/07/22   Cassandria Anger, MD  ferrous sulfate 325 (65 FE) MG EC tablet Take 325 mg by mouth every morning. 10/27/21   [provider]  gabapentin (NEURONTIN) 300 MG capsule Take 1 capsule by mouth twice daily. 07/24/22   Ivy Lynn, NP  ibuprofen (ADVIL) 200 MG tablet Take 200 mg by mouth in the morning and at bedtime.    [provider]  ibuprofen (ADVIL) 800 MG tablet Take 1 tablet (800 mg total) by mouth every 8 (eight) hours as needed. 02/10/22   Kinsinger, Arta Bruce, MD  ketoconazole (NIZORAL) 2 % shampoo Apply 1 application topically 2 (two) times a week. 07/11/21   Ivy Lynn, NP  Lancet Devices MISC Please dispense based on patient and insurance preference. Use as directed to monitor FSBS 1x daily. Dx: E11.9. 11/28/16   Alycia Rossetti, MD  Lancets MISC Please dispense based on patient and insurance preference. Use as directed to monitor FSBS 1x daily. Dx: E11.9. 11/28/16   Alycia Rossetti, MD  lisinopril (ZESTRIL) 10 MG tablet Take 1 tablet by mouth daily. 09/22/22   Ivy Lynn, NP  lisinopril (ZESTRIL) 20 MG tablet Take 20 mg by mouth daily. 10/26/21 11/23/22  [provider]  meclizine (ANTIVERT) 25 MG tablet Take 25 mg by  mouth 3 (three) times daily as needed for dizziness.    [provider]  montelukast (SINGULAIR) 10 MG tablet Take 1 tablet by mouth every evening. 07/24/22   Ivy Lynn, NP  ondansetron (ZOFRAN) 4 MG tablet Take 4 mg by mouth every 8 (eight) hours as needed for vomiting or nausea. 11/22/16   [provider]  polyethylene glycol powder (GLYCOLAX/MIRALAX) 17 GM/SCOOP powder Take 17 g by mouth every other day. 08/16/14   [provider]  potassium chloride SA (KLOR-CON M) 20 MEQ tablet Take 1 tablet by mouth daily. 05/24/22   Cassandria Anger, MD  pramipexole (MIRAPEX) 0.25 MG tablet Take 1 tablet  by mouth daily with breakfast. 07/24/22   Ivy Lynn, NP      Allergies    Fluticasone, Pneumococcal vaccine, Pneumococcal vaccines, Statins, Influenza vaccines, and Other    Review of Systems   Review of Systems  Genitourinary:  Positive for flank pain.    Physical Exam Updated Vital Signs BP 135/88   Pulse 70   Temp 98.5 F (36.9 C)   Resp 18   Ht 5' 1"$  (1.549 m)   Wt 75.7 kg   SpO2 96%   BMI 31.52 kg/m  Physical Exam Vitals and nursing note reviewed.  Constitutional:      General: She is not in acute distress.    Appearance: Normal appearance. She is well-developed. She is not ill-appearing, toxic-appearing or diaphoretic.  HENT:     Head: Normocephalic and atraumatic.     Right Ear: External ear normal.     Left Ear: External ear normal.     Nose: Nose normal.     Mouth/Throat:     Mouth: Mucous membranes are moist.  Eyes:     Extraocular Movements: Extraocular movements intact.     Conjunctiva/sclera: Conjunctivae normal.  Cardiovascular:     Rate and Rhythm: Normal rate and regular rhythm.  Pulmonary:     Effort: Pulmonary effort is normal. No respiratory distress.  Abdominal:     General: There is no distension.     Palpations: Abdomen is soft.     Tenderness: There is no abdominal tenderness.  Musculoskeletal:        General: No swelling. Normal range of motion.     Cervical back: Normal range of motion and neck supple.  Skin:    General: Skin is warm and dry.     Capillary Refill: Capillary refill takes less than 2 seconds.     Coloration: Skin is not jaundiced or pale.  Neurological:     General: No focal deficit present.     Mental Status: She is alert and oriented to person, place, and time.     Cranial Nerves: No cranial nerve deficit.     Sensory: No sensory deficit.     Motor: No weakness.     Coordination: Coordination normal.  Psychiatric:        Mood and Affect: Mood normal.        Behavior: Behavior normal.        Thought  Content: Thought content normal.        Judgment: Judgment normal.     ED Results / Procedures / Treatments   Labs (all labs ordered are listed, but only abnormal results are displayed) Labs Reviewed  URINALYSIS, ROUTINE W REFLEX MICROSCOPIC - Abnormal; Notable for the following components:      Result Value   Glucose, UA >=500 (*)    Hgb urine  dipstick SMALL (*)    All other components within normal limits  URINALYSIS, MICROSCOPIC (REFLEX) - Abnormal; Notable for the following components:   Non Squamous Epithelial PRESENT (*)    All other components within normal limits  I-STAT CHEM 8, ED    EKG None  Radiology CT Renal Stone Study  Result Date: 11/28/2022 CLINICAL DATA:  Left flank pain with nausea EXAM: CT ABDOMEN AND PELVIS WITHOUT CONTRAST TECHNIQUE: Multidetector CT imaging of the abdomen and pelvis was performed following the standard protocol without IV contrast. RADIATION DOSE REDUCTION: This exam was performed according to the departmental dose-optimization program which includes automated exposure control, adjustment of the mA and/or kV according to patient size and/or use of iterative reconstruction technique. COMPARISON:  CT 03/22/2018 images only.  No report FINDINGS: Lower chest: Lung bases are clear.  No pleural effusion. Hepatobiliary: Fatty liver infiltration identified with some areas of sparing along the gallbladder fossa margin. Gallbladder is nondilated. Pancreas: Moderate atrophy of the pancreas without obvious mass. Spleen: Normal in size without focal abnormality. Adrenals/Urinary Tract: Adrenal glands are preserved. There is moderate perinephric stranding on the left with multiple nonobstructing stones in the kidney. At least 4 are seen. Largest is along the lower pole measuring 5 mm. Moderate collecting system dilatation identified intrarenal. Less so of the ureter. No ureteral stone. Previously there was a large stone in the UPJ with a serially dilated collecting  system. Today system could be patulous. Please correlate for recently passed stone. Preserved contours of the urinary bladder without luminal calcification. There is a small hyperdense focus along the upper posterior right kidney which was not seen previously measuring 4 mm. There is similar focus towards the lower pole left exophytic lateral measuring 2.5 x 2.0 cm. These could be hyperdense hemorrhagic or proteinaceous cysts but were not seen previously are indeterminate. Stomach/Bowel: Mild scattered colonic stool diffusely. Few left-sided sigmoid colon diverticula. No obstruction or dilatation. Normal appendix extends lateral from the cecum in the right lower quadrant. Stomach is nondilated. Small bowel is nondilated. Vascular/Lymphatic: Normal caliber aorta and IVC with scattered vascular calcifications. No specific abnormal lymph node enlargement identified in the abdomen and pelvis. Reproductive: Uterus has some calcifications consistent with presumed calcified fibroids. No separate adnexal mass Other: No ascites. Musculoskeletal: Scattered degenerative changes noted along the spine and pelvis. There is curvature of the spine. Hemilaminectomy changes identified L2. Trace anterolisthesis of L4 on L5. Multilevel stenosis with disc bulging particularly at L3-4. IMPRESSION: 1. Multiple nonobstructing left renal stones. 2. Moderate left collecting system dilatation with perinephric stranding. No ureteral stone identified. Please correlate for recently passed stone. Based on the prior outside study the dilatation also could represent a patulous system. Please correlate with any known history. 3. Bilateral hyperdense renal lesions could be hyperdense hemorrhagic or proteinaceous cysts but were not seen previously. Consider follow-up contrast examination to exclude abnormal enhancement Aortic Atherosclerosis (ICD10-I70.0). Electronically Signed   By: Jill Side M.D.   On: 11/28/2022 12:47    Procedures Procedures     Medications Ordered in ED Medications - No data to display  ED Course/ Medical Decision Making/ A&P                             Medical Decision Making Amount and/or Complexity of Data Reviewed Labs: ordered.   Patient presenting for left flank pain.  Onset was 3:30 AM.  She came to the ED this morning and did have  a prolonged wait time in the waiting room.  While in the waiting room, flank pain resolved.  What was previously described as difficulty urinating has also resolved.  The patient now feels like she is able to void when urinating.  She did undergo lab work and CT imaging.  CT scan shows left renal collecting system dilatation without any obstructive uropathy.  History and CT findings consistent with recently passed stone.  Urinalysis does not show evidence of infection.  Patient was discharged in good condition.  She was advised to establish care with urology and contact information was provided.        Final Clinical Impression(s) / ED Diagnoses Final diagnoses:  Left flank pain    Rx / DC Orders ED Discharge Orders     None         Godfrey Pick, MD 11/28/22 1624

## 2022-11-28 NOTE — Discharge Instructions (Signed)
Your CT scan shows some dilation of your left ureter without an obstructing stone.  I suspect that you had a stone that has since passed.  There is a telephone number below to call to establish care with urology.  Return to the emergency department for any new or worsening symptoms of concern.

## 2022-11-29 DIAGNOSIS — M7918 Myalgia, other site: Secondary | ICD-10-CM | POA: Insufficient documentation

## 2022-11-29 NOTE — Assessment & Plan Note (Signed)
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  She is on ASA and has a previously documented history of statin intolerance. -No medication changes today. Will focus on existing risk factor control.

## 2022-11-29 NOTE — Assessment & Plan Note (Signed)
She is currently prescribed lisinopril 10 mg daily for treatment of hypertension.  Her blood pressure today was 144/81 initially and 138/79 on repeat. -No medication changes today.  Follow-up in 4 weeks for HTN check.

## 2022-11-29 NOTE — Assessment & Plan Note (Signed)
Asymptomatic currently.  Unremarkable pulmonary exam today.  She is currently prescribed an albuterol inhaler for as needed use but states that she has not needed to use her inhaler recently.

## 2022-11-29 NOTE — Assessment & Plan Note (Signed)
She is currently prescribed Lexapro 20 mg daily.  Mood is stable.  She denies SI/HI. -No medication changes today

## 2022-11-29 NOTE — Assessment & Plan Note (Signed)
She is currently prescribed fenofibrate 145 mg daily.  She has a previously documented history of statin intolerance. Lipid panel was last updated in April 2023.  Total cholesterol 182 and LDL 119. -No medication changes.  Repeat lipid panel ordered today.

## 2022-11-29 NOTE — Assessment & Plan Note (Signed)
Followed by endocrinology (Dr. Dorris Fetch).  Her most recent A1c was 6.7.  She is currently prescribed Jardiance 10 mg daily. -Repeat labs ordered today, including A1c, urine microalbumin/creatinine ratio, and lipid panel. -Endocrinology follow-up is scheduled for next month (3/5).

## 2022-11-29 NOTE — Assessment & Plan Note (Signed)
>>  ASSESSMENT AND PLAN FOR DEPRESSION, RECURRENT WRITTEN ON 11/29/2022 12:11 PM BY DIXON, PHILLIP E, MD  She is currently prescribed Lexapro  20 mg daily.  Mood is stable.  She denies SI/HI. -No medication changes today

## 2022-11-29 NOTE — Assessment & Plan Note (Signed)
Previously documented history of osteoporosis.  She is currently followed by endocrinology (Dr. Dorris Fetch).  And is prescribed Fosamax 70 mg weekly. -No medication changes today.  Repeat vitamin D level has been ordered.

## 2022-11-29 NOTE — Assessment & Plan Note (Signed)
She endorses a history of chronic musculoskeletal pain with chronic back pain and diffuse joint pain.  This is secondary to degenerative joint disease, lumbar radiculopathy, and multisite osteoarthritis.  She is currently prescribed gabapentin 300 mg twice daily and is followed by [facility]. -No medication changes today

## 2022-11-30 ENCOUNTER — Telehealth: Payer: Self-pay | Admitting: *Deleted

## 2022-11-30 ENCOUNTER — Other Ambulatory Visit: Payer: Self-pay | Admitting: Neurosurgery

## 2022-11-30 ENCOUNTER — Encounter: Payer: Self-pay | Admitting: *Deleted

## 2022-11-30 NOTE — Transitions of Care (Post Inpatient/ED Visit) (Signed)
   11/30/2022  Name: Teresa Soto MRN: [id number] DOB: [birth year]  Today's TOC FU Call Status: Today's TOC FU Call Status:: Unsuccessul Call (1st Attempt) Unsuccessful Call (1st Attempt) Date: 11/30/22  Attempted to reach the patient regarding the most recent ED visit 11/28/22; left voice message requesting call back  ED EMMI Red Alert notification: "No scheduled follow up"  Follow Up Plan: Additional outreach attempts will be made to reach the patient to complete the Transitions of Care ([place] ED visit) call.   Oneta Rack, RN, BSN, CCRN Alumnus RN CM Care Coordination/ Transition of [place] Management [phone]: direct office

## 2022-12-01 ENCOUNTER — Telehealth: Payer: Self-pay

## 2022-12-01 ENCOUNTER — Encounter: Payer: Self-pay | Admitting: *Deleted

## 2022-12-01 ENCOUNTER — Telehealth: Payer: Self-pay | Admitting: *Deleted

## 2022-12-01 DIAGNOSIS — N200 Calculus of kidney: Secondary | ICD-10-CM

## 2022-12-01 NOTE — Transitions of Care (Post Inpatient/ED Visit) (Signed)
   12/01/2022  Name: Teresa Soto MRN: [id number] DOB: December 22, 1955  Today's TOC FU Call Status: Today's TOC FU Call Status:: Successful TOC FU Call Competed (HIPAA identifiers x 2 verified) TOC FU Call Complete Date: 12/01/22  Transition Care Management Follow-up Telephone Call Date of Discharge: 11/28/22 Discharge [facility]: [facility] Type of Discharge: Emergency Department Reason for ED Visit: Other: (flank pain/ possible kidney stone) How have you been since you were released from the hospital?: Better Any questions or concerns?: No  Items Reviewed: Did you receive and understand the discharge instructions provided?: Yes (reviewed thoroughly with patient) Medications obtained and verified?: No (Confirmed by review of EHR and with pateint that no new medications prescribed at time of recent ED visit) Medications Not Reviewed Reasons:: Other: (patient declined full medication review- denies questions/ concerns around medications; confirms self- manages medications) Any new allergies since your discharge?: No Dietary orders reviewed?: Yes Type of Diet Ordered:: Regular Do you have support at home?: Yes People in Home: spouse Name of Support/Comfort Primary Source: family; reports she is completely independent in self-care activities  Home Care and Equipment/Supplies: [place] Ordered?: NA Any new equipment or medical supplies ordered?: NA  Functional Questionnaire: Do you need assistance with bathing/showering or dressing?: No Do you need assistance with meal preparation?: No Do you need assistance with eating?: No Do you have difficulty maintaining continence: No Do you need assistance with getting out of bed/getting out of a chair/moving?: No Do you have difficulty managing or taking your medications?: No  Folllow up appointments reviewed: PCP Follow-up appointment confirmed?: Yes Date of PCP follow-up appointment?: 12/21/22 (verified no ED follow up  PCP appointment recommended ati time of ED discharge) Follow-up Provider: PCP, [facility] Follow-up appointment confirmed?: No Reason Specialist Follow-Up Not Confirmed: Patient has Specialist Provider Number and will Call for Appointment Do you need transportation to your follow-up appointment?: No Do you understand care options if your condition(s) worsen?: Yes-patient verbalized understanding  SDOH Interventions Today    Flowsheet Row Most Recent Value  SDOH Interventions   Food Insecurity Interventions Intervention Not Indicated  Transportation Interventions Intervention Not Indicated  [drives self]      TOC Interventions Today    Flowsheet Row Most Recent Value  TOC Interventions   TOC Interventions Discussed/Reviewed TOC Interventions Discussed      Interventions Today    Flowsheet Row Most Recent Value  Chronic Disease   Chronic disease during today's visit Other  [kidney stones]  General Interventions   General Interventions Discussed/Reviewed General Interventions Discussed, Doctor Visits  Doctor Visits Discussed/Reviewed PCP, Specialist  PCP/Specialist Visits Compliance with follow-up visit  Education Interventions   Education Provided Provided Education  Provided Verbal Education On When to see the doctor, Other  [education provided/ discussed rationale for becoming established with specialist urology provider prior even though she is feeling better and has reportedly passed kidney stone]  Nutrition Interventions   Nutrition Discussed/Reviewed Nutrition Discussed  Pharmacy Interventions   Pharmacy Dicussed/Reviewed Pharmacy Topics Discussed      Oneta Rack, RN, BSN, CCRN Alumnus RN CM Care Coordination/ Transition of [place] Management [phone]: direct office

## 2022-12-01 NOTE — Telephone Encounter (Signed)
Patient called in today with possible kidney stones and she do not need an referral for our office per her insurance. Had Dr. Alyson Ingles review her CT stone study done at [facility] 11/28/2022, per Dr. Alyson Ingles patient need a KUB, order is in for KUB and appt made. Patient voiced understanding

## 2022-12-07 ENCOUNTER — Encounter: Payer: Self-pay | Admitting: Radiology

## 2022-12-12 ENCOUNTER — Ambulatory Visit: Payer: BC Managed Care – PPO | Admitting: "Endocrinology

## 2022-12-13 ENCOUNTER — Other Ambulatory Visit: Payer: Self-pay | Admitting: Neurosurgery

## 2022-12-15 ENCOUNTER — Ambulatory Visit: Payer: Medicare Other | Admitting: Internal Medicine

## 2022-12-21 ENCOUNTER — Ambulatory Visit (INDEPENDENT_AMBULATORY_CARE_PROVIDER_SITE_OTHER): Payer: Medicare Other | Admitting: Internal Medicine

## 2022-12-21 ENCOUNTER — Encounter: Payer: Self-pay | Admitting: Internal Medicine

## 2022-12-21 VITALS — BP 132/78 | HR 84 | Ht 61.0 in | Wt 163.8 lb

## 2022-12-21 DIAGNOSIS — E1122 Type 2 diabetes mellitus with diabetic chronic kidney disease: Secondary | ICD-10-CM

## 2022-12-21 DIAGNOSIS — M7918 Myalgia, other site: Secondary | ICD-10-CM | POA: Diagnosis not present

## 2022-12-21 DIAGNOSIS — E119 Type 2 diabetes mellitus without complications: Secondary | ICD-10-CM | POA: Diagnosis not present

## 2022-12-21 DIAGNOSIS — R413 Other amnesia: Secondary | ICD-10-CM

## 2022-12-21 DIAGNOSIS — E782 Mixed hyperlipidemia: Secondary | ICD-10-CM | POA: Diagnosis not present

## 2022-12-21 DIAGNOSIS — N1831 Chronic kidney disease, stage 3a: Secondary | ICD-10-CM

## 2022-12-21 DIAGNOSIS — G8929 Other chronic pain: Secondary | ICD-10-CM

## 2022-12-21 MED ORDER — GABAPENTIN 300 MG PO CAPS: 300.0000 mg | ORAL_CAPSULE | Freq: Two times a day (BID) | ORAL | 0 refills | Status: DC

## 2022-12-21 MED ORDER — ATORVASTATIN CALCIUM 40 MG PO TABS: 40.0000 mg | ORAL_TABLET | Freq: Every day | ORAL | 3 refills | Status: DC

## 2022-12-21 MED ORDER — EMPAGLIFLOZIN 25 MG PO TABS: 25.0000 mg | ORAL_TABLET | Freq: Every day | ORAL | 2 refills | Status: DC

## 2022-12-21 NOTE — Patient Instructions (Signed)
It was a pleasure to see you today.  Thank you for giving [place] the opportunity to be involved in your care.  Below is a brief recap of your visit and next steps.  We will plan to see you again in 4 weeks.  Summary Increase Jardiance to 25 mg daily Start atorvastatin 40 mg daily Gabapentin refilled Follow up in 4 weeks Good luck with surgery next week

## 2022-12-21 NOTE — Pre-Procedure Instructions (Signed)
Surgical Instructions    Your procedure is scheduled on Tuesday, December 26, 2022 at 12:44 PM.  Report to [facility] Main Entrance "A" at 10:45 A.M., then check in with the Admitting office.  Call this number if you have problems the morning of surgery:  [phone]   If you have any questions prior to your surgery date call [phone]: Open Monday-Friday 8am-4pm  *If you experience any cold or flu symptoms such as cough, fever, chills, shortness of breath, etc. between now and your scheduled surgery, please notify us.*    Remember:  Do not eat or drink after midnight the night before your surgery    Take these medicines the morning of surgery with A SIP OF WATER:  alendronate (FOSAMAX)  atorvastatin (LIPITOR)  escitalopram (LEXAPRO)  fenofibrate (TRICOR)  gabapentin (NEURONTIN)  pramipexole (MIRAPEX)   IF NEEDED: albuterol (VENTOLIN HFA) - Bring with you day of surgery meclizine (ANTIVERT)  ondansetron (ZOFRAN)   Follow your surgeon's instructions on when to stop Aspirin.  If no instructions were given by your surgeon then you will need to call the office to get those instructions.     As of today, STOP taking any Aleve, Naproxen, Ibuprofen, Motrin, Advil, Goody's, BC's, all herbal medications, fish oil, and all vitamins.  WHAT DO I DO ABOUT MY DIABETES MEDICATION?   Stop taking your empagliflozin (JARDIANCE) 72 hours prior to surgery. Your last dose should be on 12/22/22.  HOW TO MANAGE YOUR DIABETES BEFORE AND AFTER SURGERY  Why is it important to control my blood sugar before and after surgery? Improving blood sugar levels before and after surgery helps healing and can limit problems. A way of improving blood sugar control is eating a healthy diet by:  Eating less sugar and carbohydrates  Increasing activity/exercise  Talking with your doctor about reaching your blood sugar goals High blood sugars (greater than 180 mg/dL) can raise your risk of infections and  slow your recovery, so you will need to focus on controlling your diabetes during the weeks before surgery. Make sure that the doctor who takes care of your diabetes knows about your planned surgery including the date and location.  How do I manage my blood sugar before surgery? Check your blood sugar at least 4 times a day, starting 2 days before surgery, to make sure that the level is not too high or low.  Check your blood sugar the morning of your surgery when you wake up and every 2 hours until you get to the Short Stay unit.  If your blood sugar is less than 70 mg/dL, you will need to treat for low blood sugar: Do not take insulin. Treat a low blood sugar (less than 70 mg/dL) with  cup of clear juice (cranberry or apple), 4 glucose tablets, OR glucose gel. Recheck blood sugar in 15 minutes after treatment (to make sure it is greater than 70 mg/dL). If your blood sugar is not greater than 70 mg/dL on recheck, call [phone] for further instructions. Report your blood sugar to the short stay nurse when you get to Short Stay.  If you are admitted to the hospital after surgery: Your blood sugar will be checked by the staff and you will probably be given insulin after surgery (instead of oral diabetes medicines) to make sure you have good blood sugar levels. The goal for blood sugar control after surgery is 80-180 mg/dL.             Do NOT Smoke (  Tobacco/Vaping) for 24 hours prior to your procedure.  If you use a CPAP at night, you may bring your mask/headgear for your overnight stay.   Contacts, glasses, piercing's, hearing aid's, dentures or partials may not be worn into surgery, please bring cases for these belongings.    For patients admitted to the hospital, discharge time will be determined by your treatment team.   Patients discharged the day of surgery will not be allowed to drive home, and someone needs to stay with them for 24 hours.  SURGICAL WAITING ROOM  VISITATION Patients having surgery or a procedure may have 2 support people in the waiting area. Visitors may stay in the waiting area during the procedure and switch out with other visitors if needed. Only 1 support person is allowed in the pre-op area with the patient AFTER the patient is prepped. This person cannot be switched out.  Children under the age of 52 must have an adult accompany them who is not the patient. If the patient needs to stay at the hospital during part of their recovery, the visitor guidelines for inpatient rooms apply.  Please refer to the [facility] website for the visitor guidelines for Inpatients (after your surgery is over and you are in a regular room).    Special instructions:   [place]- Preparing For Surgery  Before surgery, you can play an important role. Because skin is not sterile, your skin needs to be as free of germs as possible. You can reduce the number of germs on your skin by washing with CHG (chlorahexidine gluconate) Soap before surgery.  CHG is an antiseptic cleaner which kills germs and bonds with the skin to continue killing germs even after washing.    Oral Hygiene is also important to reduce your risk of infection.  Remember - BRUSH YOUR TEETH THE MORNING OF SURGERY WITH YOUR REGULAR TOOTHPASTE  Please do not use if you have an allergy to CHG or antibacterial soaps. If your skin becomes reddened/irritated stop using the CHG.  Do not shave (including legs and underarms) for at least 48 hours prior to first CHG shower. It is OK to shave your face.  Please follow these instructions carefully.   Shower the NIGHT BEFORE SURGERY and the MORNING OF SURGERY  If you chose to wash your hair, wash your hair first as usual with your normal shampoo.  After you shampoo, rinse your hair and body thoroughly to remove the shampoo.  Use CHG Soap as you would any other liquid soap. You can apply CHG directly to the skin and wash gently with a scrungie or  a clean washcloth.   Apply the CHG Soap to your body ONLY FROM THE NECK DOWN (neck, arms, chest, abdomen, legs, and back).  Do not use on open wounds or open sores. Avoid contact with your eyes, ears, mouth and genitals (private parts). Wash Face and genitals (private parts)  with your normal soap.   Wash thoroughly, paying special attention to the area where your surgery will be performed.  Thoroughly rinse your body with warm water from the neck down.  DO NOT shower/wash with your normal soap after using and rinsing off the CHG Soap.  Pat yourself dry with a CLEAN TOWEL.  Wear CLEAN PAJAMAS to bed the night before surgery  Place CLEAN SHEETS on your bed the night before your surgery  DO NOT SLEEP WITH PETS.   Day of Surgery: Take a shower with CHG soap. Do not wear lotions, powders,  perfumes/colognes, or deodorant. Do not wear jewelry or makeup Do not shave 48 hours prior to surgery. Do not wear nail polish, gel polish, artificial nails, or any other type of covering on natural nails (fingers and toes) If you have artificial nails or gel coating that need to be removed by a nail salon, please have this removed prior to surgery. Artificial nails or gel coating may interfere with anesthesia's ability to adequately monitor your vital signs. Wear Clean/Comfortable clothing the morning of surgery Do not bring valuables to the hospital.  [facility] is not responsible for any belongings or valuables. Remember to brush your teeth WITH YOUR REGULAR TOOTHPASTE.   Please read over the following fact sheets that you were given.  If you received a COVID test during your pre-op visit  it is requested that you wear a mask when out in public, stay away from anyone that may not be feeling well and notify your surgeon if you develop symptoms. If you have been in contact with anyone that has tested positive in the last 10 days please notify you surgeon.

## 2022-12-21 NOTE — Assessment & Plan Note (Signed)
Ms. Lesperance expresses concern today for deficits in her memory.  She is interested in undergoing further workup for memory impairment. -Plan for MoCA at future appointment

## 2022-12-21 NOTE — Progress Notes (Signed)
Established Patient Office Visit  Subjective   Patient ID: Teresa Soto, female    DOB: 1956/02/28  Age: 67 y.o. MRN: [id number]  Chief Complaint  Patient presents with   lab review   medication review   Teresa Soto returns to care today for follow-up.  She was last seen by me on 2/15 as a new patient presenting to establish care.  No medication changes were made and 1 month follow-up was arranged for lab and medication review.  In the interim she presented to the emergency department on 2/20 endorsing left flank pain.  CT showed dilatation of the left renal collecting system without any obstructive uropathy, most consistent with a recently passed stone.  She has not had any recurrence of her symptoms. Teresa Soto endorses chronic musculoskeletal pain today but is otherwise feeling well.  She will undergo spinal fusion surgery next Tuesday (3/19).  She does not have any acute concerns to discuss today.  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    PTSD (post-traumatic stress disorder)    Restless legs syndrome 2007 approx   Seasonal allergies    Shortness of breath dyspnea    coldness    Stroke ([place]) 2015   Past Surgical History:  Procedure Laterality Date   BACK SURGERY  2012   L1-L2  x2   CESAREAN SECTION     x 2   JOINT REPLACEMENT     PARATHYROIDECTOMY N/A 06/27/2016   Procedure: MINIMALLY INVASIVE PARATHYROIDECTOMY;  Surgeon: Greer Pickerel, MD;  Location: [facility];  Service: General;  Laterality: N/A;   PARATHYROIDECTOMY Right 02/09/2022   Procedure: PARATHYROIDECTOMY;  Surgeon: Kinsinger, Arta Bruce, MD;  Location: [facility];  Service: General;  Laterality: Right;   SHOULDER ARTHROSCOPY W/ ROTATOR CUFF REPAIR  2010   to right shoulder   SPINE SURGERY  12/2010   TONSILLECTOMY     TOTAL KNEE ARTHROPLASTY Right 08/12/2014   Procedure: RIGHT TOTAL KNEE ARTHROPLASTY;  Surgeon: Carole Civil, MD;  Location: [facility];  Service: Orthopedics;  Laterality: Right;   TUBAL LIGATION  1999   Social History   Tobacco Use   Smoking status: Former    Packs/day: 2.00    Years: 6.00    Additional pack years: 0.00    Total pack years: 12.00    Types: Cigarettes    Quit date: 10/09/1982    Years since quitting: 40.2    Passive exposure: Past   Smokeless tobacco: Never   Tobacco comments:    quit in 1984  Vaping Use   Vaping Use: Former  Substance Use Topics   Alcohol use: Yes    Alcohol/week: 2.0 standard drinks of alcohol    Types: 1 Glasses of wine, 1 Cans of beer per week   Drug use: No  Family History  Problem Relation Age of Onset   Hypertension Mother    Hyperlipidemia Mother    Alcohol abuse Mother    Arthritis Mother    Alcohol abuse Father    Dementia Paternal Grandmother    Alcohol abuse Maternal Grandfather    Hearing loss Maternal Grandfather    Heart disease Maternal Grandfather    Hypertension Maternal Grandfather    Depression Maternal Grandmother    ADD / ADHD Son    Learning disabilities Son    Alcohol abuse Sister    Arthritis Sister    Alcohol abuse Paternal Uncle    Diabetes Maternal Aunt    Obesity Maternal Aunt    Allergies  Allergen Reactions   Fluticasone     headache   Statins Other (See Comments)   Influenza Vaccines Rash   Other Nausea And Vomiting    Peppers    Pneumococcal Vaccine Rash    Had rash, given both flu and PNA at same time    Pneumococcal Vaccines Rash    Had  rash, given both flu and PNA at same time    Review of Systems  Musculoskeletal:  Positive for back pain, joint pain and neck pain.  Psychiatric/Behavioral:  Positive for memory loss.   All other systems reviewed and are negative.    Objective:     BP 132/78   Pulse 84   Ht '5\' 1"'$  (1.549 m)   Wt 163 lb 12.8 oz (74.3 kg)   SpO2 95%   BMI 30.95 kg/m  BP Readings from Last 3 Encounters:  12/21/22 132/78  11/28/22 135/88  11/23/22 138/79   Physical Exam Vitals reviewed.  Constitutional:      General: She is not in acute distress.    Appearance: Normal appearance. She is obese. She is not toxic-appearing.  HENT:     Head: Normocephalic and atraumatic.     Right Ear: External ear normal.     Left Ear: External ear normal.     Nose: Nose normal. No congestion or rhinorrhea.     Mouth/Throat:     Mouth: Mucous membranes are moist.     Pharynx: Oropharynx is clear. No oropharyngeal exudate or posterior oropharyngeal erythema.  Eyes:     General: No scleral icterus.    Extraocular Movements: Extraocular movements intact.     Conjunctiva/sclera: Conjunctivae normal.     Pupils: Pupils are equal, round, and reactive to light.  Cardiovascular:     Rate and Rhythm: Normal rate and regular rhythm.     Pulses: Normal pulses.     Heart sounds: Normal heart sounds. No murmur heard.    No friction rub. No gallop.  Pulmonary:     Effort: Pulmonary effort is normal.     Breath sounds: Normal breath sounds. No wheezing, rhonchi or rales.  Abdominal:     General: Abdomen is flat. Bowel sounds are normal. There is no distension.     Palpations: Abdomen is soft.     Tenderness: There is no abdominal tenderness.  Musculoskeletal:        General: No swelling. Normal range of motion.     Cervical back: Normal range of motion.     Right lower leg: No edema.     Left lower leg: No edema.  Lymphadenopathy:     Cervical: No cervical adenopathy.  Skin:    General: Skin is warm and dry.      Capillary Refill: Capillary refill takes less than 2 seconds.  Coloration: Skin is not jaundiced.  Neurological:     General: No focal deficit present.     Mental Status: She is alert and oriented to person, place, and time.  Psychiatric:        Mood and Affect: Mood normal.        Behavior: Behavior normal.   Last CBC Lab Results  Component Value Date   WBC 7.3 11/23/2022   HGB 15.3 11/23/2022   HCT 47.3 (H) 11/23/2022   MCV 87 11/23/2022   MCH 28.0 11/23/2022   RDW 13.5 11/23/2022   PLT 347 [id number]   Last metabolic panel Lab Results  Component Value Date   GLUCOSE 182 (H) 11/23/2022   NA 138 11/23/2022   K 4.4 11/23/2022   CL 99 11/23/2022   CO2 20 11/23/2022   BUN 26 11/23/2022   CREATININE 1.09 (H) 11/23/2022   EGFR 56 (L) 11/23/2022   CALCIUM 10.7 (H) 11/23/2022   PHOS 3.6 03/29/2022   PROT 7.4 11/23/2022   ALBUMIN 4.8 11/23/2022   LABGLOB 2.6 11/23/2022   AGRATIO 1.8 11/23/2022   BILITOT 0.3 11/23/2022   ALKPHOS 40 (L) 11/23/2022   AST 14 11/23/2022   ALT 13 11/23/2022   ANIONGAP 8 02/10/2022   Last lipids Lab Results  Component Value Date   CHOL 203 (H) 11/23/2022   HDL 45 11/23/2022   LDLCALC 128 (H) 11/23/2022   TRIG 170 (H) 11/23/2022   CHOLHDL 4.5 (H) 11/23/2022   Last hemoglobin A1c Lab Results  Component Value Date   HGBA1C 7.6 (H) 11/23/2022   Last thyroid functions Lab Results  Component Value Date   TSH 0.809 11/23/2022   Last vitamin D Lab Results  Component Value Date   VD25OH 46.6 11/23/2022   Last vitamin B12 and Folate Lab Results  Component Value Date   VITAMINB12 862 11/23/2022   FOLATE 13.7 11/23/2022     Assessment & Plan:   Problem List Items Addressed This Visit       Type 2 diabetes mellitus with stage 3a chronic kidney disease, without long-term current use of insulin ([place]) - Primary    Followed by endocrinology (Dr. Dorris Fetch).  She missed her most recent follow-up appointment scheduled for 3/5.  She is  currently prescribed Jardiance 10 mg daily.  A1c increased to 7.6 from 6.7 previously.  Moderately increased urine microalbumin/creatinine ratio noted as well. -Increase Jardiance to 25 mg daily      Mixed hyperlipidemia    Lipid panel updated last month.  Total cholesterol 203 and LDL 128.  She is currently prescribed fenofibrate 145 mg daily.  Previously documented history of statin intolerance, however she does not recall this.  Her medical history is also significant for prior CVA. -Through shared decision making, Teresa Soto would like to reattempt statin therapy as she does not recall any adverse reaction.  I have prescribed atorvastatin 40 mg daily.  We will tentatively plan for follow-up in 4 weeks for reassessment.      Chronic musculoskeletal pain    She continues to endorse chronic musculoskeletal pain and reports that she will undergo spinal fusion surgery next week (3/19).  She has requested a refill of gabapentin today. -Gabapentin has been refilled -We discussed transitioning from Lexapro to Cymbalta at a future date for improved treatment of chronic musculoskeletal pain      Memory changes    Teresa Soto expresses concern today for deficits in her memory.  She is interested in undergoing further workup for  memory impairment. -Plan for MoCA at future appointment       Return in about 4 weeks (around 01/18/2023) for DM, HLD, MSK pain.    Johnette Abraham, MD

## 2022-12-21 NOTE — Assessment & Plan Note (Signed)
Followed by endocrinology (Dr. Dorris Fetch).  She missed her most recent follow-up appointment scheduled for 3/5.  She is currently prescribed Jardiance 10 mg daily.  A1c increased to 7.6 from 6.7 previously.  Moderately increased urine microalbumin/creatinine ratio noted as well. -Increase Jardiance to 25 mg daily

## 2022-12-21 NOTE — Assessment & Plan Note (Signed)
She continues to endorse chronic musculoskeletal pain and reports that she will undergo spinal fusion surgery next week (3/19).  She has requested a refill of gabapentin today. -Gabapentin has been refilled -We discussed transitioning from Lexapro to Cymbalta at a future date for improved treatment of chronic musculoskeletal pain

## 2022-12-21 NOTE — Assessment & Plan Note (Signed)
Lipid panel updated last month.  Total cholesterol 203 and LDL 128.  She is currently prescribed fenofibrate 145 mg daily.  Previously documented history of statin intolerance, however she does not recall this.  Her medical history is also significant for prior CVA. -Through shared decision making, Teresa Soto would like to reattempt statin therapy as she does not recall any adverse reaction.  I have prescribed atorvastatin 40 mg daily.  We will tentatively plan for follow-up in 4 weeks for reassessment.

## 2022-12-22 ENCOUNTER — Other Ambulatory Visit: Payer: Self-pay

## 2022-12-22 ENCOUNTER — Encounter (HOSPITAL_COMMUNITY)
Admission: RE | Admit: 2022-12-22 | Discharge: 2022-12-22 | Disposition: A | Discharge status: Home / Self Care | Payer: Medicare Other | Source: Ambulatory Visit | Attending: Anesthesiology | Admitting: Anesthesiology

## 2022-12-22 ENCOUNTER — Encounter (HOSPITAL_COMMUNITY): Payer: Self-pay

## 2022-12-22 VITALS — BP 117/75 | HR 85 | Temp 98.2°F | Resp 18 | Ht 61.0 in | Wt 162.7 lb

## 2022-12-22 DIAGNOSIS — N1831 Chronic kidney disease, stage 3a: Secondary | ICD-10-CM | POA: Diagnosis not present

## 2022-12-22 DIAGNOSIS — Z01818 Encounter for other preprocedural examination: Secondary | ICD-10-CM

## 2022-12-22 DIAGNOSIS — Z01812 Encounter for preprocedural laboratory examination: Secondary | ICD-10-CM | POA: Diagnosis not present

## 2022-12-22 DIAGNOSIS — E1122 Type 2 diabetes mellitus with diabetic chronic kidney disease: Secondary | ICD-10-CM | POA: Diagnosis not present

## 2022-12-22 LAB — CBC
HCT: 44.6 % (ref 36.0–46.0)
Hemoglobin: 14.7 g/dL (ref 12.0–15.0)
MCH: 28.4 pg (ref 26.0–34.0)
MCHC: 33 g/dL (ref 30.0–36.0)
MCV: 86.3 fL (ref 80.0–100.0)
Platelets: 329 10*3/uL (ref 150–400)
RBC: 5.17 MIL/uL — ABNORMAL HIGH (ref 3.87–5.11)
RDW: 13.5 % (ref 11.5–15.5)
WBC: 6.8 10*3/uL (ref 4.0–10.5)
nRBC: 0 % (ref 0.0–0.2)

## 2022-12-22 LAB — TYPE AND SCREEN
ABO/RH(D): A POS
Antibody Screen: NEGATIVE

## 2022-12-22 LAB — SURGICAL PCR SCREEN
MRSA, PCR: NEGATIVE
Staphylococcus aureus: NEGATIVE

## 2022-12-22 LAB — BASIC METABOLIC PANEL
Anion gap: 10 (ref 5–15)
BUN: 23 mg/dL (ref 8–23)
CO2: 24 mmol/L (ref 22–32)
Calcium: 10 mg/dL (ref 8.9–10.3)
Chloride: 102 mmol/L (ref 98–111)
Creatinine, Ser: 1 mg/dL (ref 0.44–1.00)
GFR, Estimated: >60 mL/min (ref >60)
Glucose, Bld: 230 mg/dL — ABNORMAL HIGH (ref 70–99)
Potassium: 3.7 mmol/L (ref 3.5–5.1)
Sodium: 136 mmol/L (ref 135–145)

## 2022-12-22 LAB — GLUCOSE, CAPILLARY: Glucose-Capillary: 250 mg/dL — ABNORMAL HIGH (ref 70–99)

## 2022-12-22 NOTE — Progress Notes (Signed)
PCP: Dr. Marland Kitchen Cardiologist: denies  EKG: 01-27-22 CXR: n/a ECHO: 09-27-2012 Stress Test: denies Cardiac Cath: denies  Fasting Blood Sugar-117-130 Checks Blood Sugar twice weekly CBG 250 today, just had breakfast, egg, 2 pieces of toast Hgb A1C=7.6 on 11/23/22  ASA: LD 12/20/22  Patient denies fever, cough, and chest pain at PAT appointment. Endorses shortness of breath occasionally when breathing cold air. Used inhaler x1 during appt, expressed "It helps." Not a new symptom.  Patient verbalized understanding of instructions provided today at the PAT appointment.  Patient asked to review instructions at home and day of surgery.

## 2022-12-26 ENCOUNTER — Inpatient Hospital Stay (HOSPITAL_COMMUNITY): Payer: Medicare Other

## 2022-12-26 ENCOUNTER — Other Ambulatory Visit: Payer: Self-pay

## 2022-12-26 ENCOUNTER — Inpatient Hospital Stay (HOSPITAL_COMMUNITY): Payer: Medicare Other | Admitting: Certified Registered Nurse Anesthetist

## 2022-12-26 ENCOUNTER — Inpatient Hospital Stay (HOSPITAL_COMMUNITY)
Admission: RE | Admit: 2022-12-26 | Discharge: 2022-12-27 | DRG: 455 | Disposition: A | Discharge status: Home / Self Care | Payer: Medicare Other | Attending: Neurosurgery | Admitting: Neurosurgery

## 2022-12-26 ENCOUNTER — Encounter (HOSPITAL_COMMUNITY): Payer: Self-pay

## 2022-12-26 ENCOUNTER — Encounter (HOSPITAL_COMMUNITY)
Admission: RE | Disposition: A | Discharge status: Home / Self Care | Payer: Self-pay | Source: Home / Self Care | Attending: Neurosurgery

## 2022-12-26 DIAGNOSIS — M5116 Intervertebral disc disorders with radiculopathy, lumbar region: Secondary | ICD-10-CM | POA: Diagnosis not present

## 2022-12-26 DIAGNOSIS — Z6830 Body mass index (BMI) 30.0-30.9, adult: Secondary | ICD-10-CM

## 2022-12-26 DIAGNOSIS — J45909 Unspecified asthma, uncomplicated: Secondary | ICD-10-CM | POA: Diagnosis not present

## 2022-12-26 DIAGNOSIS — F4310 Post-traumatic stress disorder, unspecified: Secondary | ICD-10-CM | POA: Diagnosis not present

## 2022-12-26 DIAGNOSIS — N1831 Chronic kidney disease, stage 3a: Secondary | ICD-10-CM | POA: Diagnosis present

## 2022-12-26 DIAGNOSIS — G2581 Restless legs syndrome: Secondary | ICD-10-CM | POA: Diagnosis present

## 2022-12-26 DIAGNOSIS — M810 Age-related osteoporosis without current pathological fracture: Secondary | ICD-10-CM | POA: Diagnosis present

## 2022-12-26 DIAGNOSIS — Z8673 Personal history of transient ischemic attack (TIA), and cerebral infarction without residual deficits: Secondary | ICD-10-CM

## 2022-12-26 DIAGNOSIS — Z981 Arthrodesis status: Principal | ICD-10-CM

## 2022-12-26 DIAGNOSIS — E210 Primary hyperparathyroidism: Secondary | ICD-10-CM | POA: Diagnosis not present

## 2022-12-26 DIAGNOSIS — I10 Essential (primary) hypertension: Secondary | ICD-10-CM | POA: Diagnosis not present

## 2022-12-26 DIAGNOSIS — Z87891 Personal history of nicotine dependence: Secondary | ICD-10-CM

## 2022-12-26 DIAGNOSIS — E559 Vitamin D deficiency, unspecified: Secondary | ICD-10-CM | POA: Diagnosis not present

## 2022-12-26 DIAGNOSIS — Z8249 Family history of ischemic heart disease and other diseases of the circulatory system: Secondary | ICD-10-CM

## 2022-12-26 DIAGNOSIS — M48061 Spinal stenosis, lumbar region without neurogenic claudication: Secondary | ICD-10-CM | POA: Diagnosis not present

## 2022-12-26 DIAGNOSIS — Z96651 Presence of right artificial knee joint: Secondary | ICD-10-CM | POA: Diagnosis not present

## 2022-12-26 DIAGNOSIS — Z7982 Long term (current) use of aspirin: Secondary | ICD-10-CM | POA: Diagnosis not present

## 2022-12-26 DIAGNOSIS — H9190 Unspecified hearing loss, unspecified ear: Secondary | ICD-10-CM | POA: Diagnosis present

## 2022-12-26 DIAGNOSIS — Z83438 Family history of other disorder of lipoprotein metabolism and other lipidemia: Secondary | ICD-10-CM

## 2022-12-26 DIAGNOSIS — M4316 Spondylolisthesis, lumbar region: Secondary | ICD-10-CM | POA: Diagnosis present

## 2022-12-26 DIAGNOSIS — Z0389 Encounter for observation for other suspected diseases and conditions ruled out: Secondary | ICD-10-CM | POA: Diagnosis not present

## 2022-12-26 DIAGNOSIS — E669 Obesity, unspecified: Secondary | ICD-10-CM | POA: Diagnosis present

## 2022-12-26 DIAGNOSIS — Z7984 Long term (current) use of oral hypoglycemic drugs: Secondary | ICD-10-CM

## 2022-12-26 DIAGNOSIS — Z818 Family history of other mental and behavioral disorders: Secondary | ICD-10-CM

## 2022-12-26 DIAGNOSIS — Z7983 Long term (current) use of bisphosphonates: Secondary | ICD-10-CM | POA: Diagnosis not present

## 2022-12-26 DIAGNOSIS — Z887 Allergy status to serum and vaccine status: Secondary | ICD-10-CM

## 2022-12-26 DIAGNOSIS — E119 Type 2 diabetes mellitus without complications: Secondary | ICD-10-CM

## 2022-12-26 DIAGNOSIS — Z888 Allergy status to other drugs, medicaments and biological substances status: Secondary | ICD-10-CM

## 2022-12-26 DIAGNOSIS — E1122 Type 2 diabetes mellitus with diabetic chronic kidney disease: Secondary | ICD-10-CM | POA: Diagnosis present

## 2022-12-26 DIAGNOSIS — E782 Mixed hyperlipidemia: Secondary | ICD-10-CM | POA: Diagnosis present

## 2022-12-26 DIAGNOSIS — Z79899 Other long term (current) drug therapy: Secondary | ICD-10-CM

## 2022-12-26 DIAGNOSIS — I129 Hypertensive chronic kidney disease with stage 1 through stage 4 chronic kidney disease, or unspecified chronic kidney disease: Secondary | ICD-10-CM | POA: Diagnosis present

## 2022-12-26 DIAGNOSIS — Z833 Family history of diabetes mellitus: Secondary | ICD-10-CM

## 2022-12-26 HISTORY — PX: LUMBAR PERCUTANEOUS PEDICLE SCREW 1 LEVEL: SHX5560

## 2022-12-26 HISTORY — PX: ANTERIOR LAT LUMBAR FUSION: SHX1168

## 2022-12-26 LAB — GLUCOSE, CAPILLARY
Glucose-Capillary: 149 mg/dL — ABNORMAL HIGH (ref 70–99)
Glucose-Capillary: 126 mg/dL — ABNORMAL HIGH (ref 70–99)
Glucose-Capillary: 138 mg/dL — ABNORMAL HIGH (ref 70–99)
Glucose-Capillary: 204 mg/dL — ABNORMAL HIGH (ref 70–99)

## 2022-12-26 SURGERY — ANTERIOR LATERAL LUMBAR FUSION 1 LEVEL
Anesthesia: General | Laterality: Right

## 2022-12-26 MED ORDER — 0.9 % SODIUM CHLORIDE (POUR BTL) OPTIME
TOPICAL | Status: DC | PRN
  Administered 2022-12-26: 1000 mL

## 2022-12-26 MED ORDER — ACETAMINOPHEN 10 MG/ML IV SOLN
INTRAVENOUS | Status: AC
  Filled 2022-12-26: qty 100

## 2022-12-26 MED ORDER — FERROUS SULFATE 325 (65 FE) MG PO TABS: 325.0000 mg | ORAL_TABLET | Freq: Every morning | ORAL | Status: DC

## 2022-12-26 MED ORDER — ONDANSETRON HCL 4 MG/2ML IJ SOLN
INTRAMUSCULAR | Status: AC
  Filled 2022-12-26: qty 2

## 2022-12-26 MED ORDER — SUCCINYLCHOLINE CHLORIDE 200 MG/10ML IV SOSY
PREFILLED_SYRINGE | INTRAVENOUS | Status: DC | PRN
  Administered 2022-12-26: 100 mg via INTRAVENOUS

## 2022-12-26 MED ORDER — ACETAMINOPHEN 650 MG RE SUPP: 650.0000 mg | RECTAL | Status: DC | PRN

## 2022-12-26 MED ORDER — LIDOCAINE-EPINEPHRINE 1 %-1:100000 IJ SOLN
INTRAMUSCULAR | Status: AC
  Filled 2022-12-26: qty 1

## 2022-12-26 MED ORDER — HYDROMORPHONE HCL 1 MG/ML IJ SOLN
INTRAMUSCULAR | Status: AC
  Filled 2022-12-26: qty 1

## 2022-12-26 MED ORDER — ATORVASTATIN CALCIUM 40 MG PO TABS
40.0000 mg | ORAL_TABLET | Freq: Every day | ORAL | Status: DC
  Filled 2022-12-26: qty 1

## 2022-12-26 MED ORDER — POLYETHYLENE GLYCOL 3350 17 G PO PACK
17.0000 g | PACK | Freq: Every day | ORAL | Status: DC | PRN
  Administered 2022-12-26: 17 g via ORAL
  Filled 2022-12-26: qty 1

## 2022-12-26 MED ORDER — THROMBIN 5000 UNITS EX SOLR
OROMUCOSAL | Status: DC | PRN
  Administered 2022-12-26: 5 mL via TOPICAL

## 2022-12-26 MED ORDER — BUPIVACAINE LIPOSOME 1.3 % IJ SUSP
INTRAMUSCULAR | Status: DC | PRN
  Administered 2022-12-26: 20 mL

## 2022-12-26 MED ORDER — CHLORHEXIDINE GLUCONATE CLOTH 2 % EX PADS: 6.0000 | MEDICATED_PAD | Freq: Once | CUTANEOUS | Status: DC

## 2022-12-26 MED ORDER — CEFAZOLIN SODIUM-DEXTROSE 1-4 GM/50ML-% IV SOLN
1.0000 g | Freq: Three times a day (TID) | INTRAVENOUS | Status: AC
  Administered 2022-12-26 – 2022-12-27 (×3): 1 g via INTRAVENOUS
  Filled 2022-12-26 (×3): qty 50

## 2022-12-26 MED ORDER — BUPIVACAINE LIPOSOME 1.3 % IJ SUSP
INTRAMUSCULAR | Status: AC
  Filled 2022-12-26: qty 20

## 2022-12-26 MED ORDER — CYCLOBENZAPRINE HCL 10 MG PO TABS
10.0000 mg | ORAL_TABLET | Freq: Three times a day (TID) | ORAL | Status: DC | PRN
  Administered 2022-12-26 – 2022-12-27 (×3): 10 mg via ORAL
  Filled 2022-12-26 (×2): qty 1

## 2022-12-26 MED ORDER — PROMETHAZINE HCL 25 MG/ML IJ SOLN: 6.2500 mg | INTRAMUSCULAR | Status: DC | PRN

## 2022-12-26 MED ORDER — PROPOFOL 1000 MG/100ML IV EMUL
INTRAVENOUS | Status: AC
  Filled 2022-12-26: qty 100

## 2022-12-26 MED ORDER — GABAPENTIN 300 MG PO CAPS
300.0000 mg | ORAL_CAPSULE | Freq: Two times a day (BID) | ORAL | Status: DC
  Administered 2022-12-26 – 2022-12-27 (×2): 300 mg via ORAL
  Filled 2022-12-26 (×2): qty 1

## 2022-12-26 MED ORDER — LISINOPRIL 10 MG PO TABS: 10.0000 mg | ORAL_TABLET | Freq: Every day | ORAL | Status: DC

## 2022-12-26 MED ORDER — KETAMINE HCL 50 MG/5ML IJ SOSY
PREFILLED_SYRINGE | INTRAMUSCULAR | Status: AC
  Filled 2022-12-26: qty 5

## 2022-12-26 MED ORDER — PHENYLEPHRINE 80 MCG/ML (10ML) SYRINGE FOR IV PUSH (FOR BLOOD PRESSURE SUPPORT)
PREFILLED_SYRINGE | INTRAVENOUS | Status: DC | PRN
  Administered 2022-12-26 (×2): 160 ug via INTRAVENOUS
  Administered 2022-12-26: 80 ug via INTRAVENOUS

## 2022-12-26 MED ORDER — MIDAZOLAM HCL 2 MG/2ML IJ SOLN
INTRAMUSCULAR | Status: AC
  Filled 2022-12-26: qty 2

## 2022-12-26 MED ORDER — PROPOFOL 10 MG/ML IV BOLUS
INTRAVENOUS | Status: AC
  Filled 2022-12-26: qty 20

## 2022-12-26 MED ORDER — HYDROMORPHONE HCL 1 MG/ML IJ SOLN
0.2500 mg | INTRAMUSCULAR | Status: DC | PRN
  Administered 2022-12-26 (×3): 0.25 mg via INTRAVENOUS
  Administered 2022-12-26 (×2): 0.5 mg via INTRAVENOUS

## 2022-12-26 MED ORDER — OXYCODONE HCL 5 MG/5ML PO SOLN: 5.0000 mg | Freq: Once | ORAL | Status: DC | PRN

## 2022-12-26 MED ORDER — PHENYLEPHRINE HCL-NACL 20-0.9 MG/250ML-% IV SOLN
INTRAVENOUS | Status: DC | PRN
  Administered 2022-12-26: 30 ug/min via INTRAVENOUS

## 2022-12-26 MED ORDER — SUGAMMADEX SODIUM 200 MG/2ML IV SOLN
INTRAVENOUS | Status: DC | PRN
  Administered 2022-12-26: 200 mg via INTRAVENOUS

## 2022-12-26 MED ORDER — BUPIVACAINE HCL (PF) 0.5 % IJ SOLN
INTRAMUSCULAR | Status: AC
  Filled 2022-12-26: qty 30

## 2022-12-26 MED ORDER — MONTELUKAST SODIUM 10 MG PO TABS
10.0000 mg | ORAL_TABLET | Freq: Every evening | ORAL | Status: DC
  Filled 2022-12-26: qty 1

## 2022-12-26 MED ORDER — ROCURONIUM BROMIDE 10 MG/ML (PF) SYRINGE
PREFILLED_SYRINGE | INTRAVENOUS | Status: DC | PRN
  Administered 2022-12-26 (×2): 20 mg via INTRAVENOUS
  Administered 2022-12-26: 10 mg via INTRAVENOUS

## 2022-12-26 MED ORDER — CYCLOBENZAPRINE HCL 10 MG PO TABS
ORAL_TABLET | ORAL | Status: AC
  Filled 2022-12-26: qty 1

## 2022-12-26 MED ORDER — SODIUM CHLORIDE 0.9% FLUSH: 3.0000 mL | INTRAVENOUS | Status: DC | PRN

## 2022-12-26 MED ORDER — ROCURONIUM BROMIDE 10 MG/ML (PF) SYRINGE
PREFILLED_SYRINGE | INTRAVENOUS | Status: AC
  Filled 2022-12-26: qty 10

## 2022-12-26 MED ORDER — LACTATED RINGERS IV SOLN: INTRAVENOUS | Status: DC

## 2022-12-26 MED ORDER — OXYCODONE HCL 5 MG PO TABS: 5.0000 mg | ORAL_TABLET | Freq: Once | ORAL | Status: DC | PRN

## 2022-12-26 MED ORDER — MENTHOL 3 MG MT LOZG: 1.0000 | LOZENGE | OROMUCOSAL | Status: DC | PRN

## 2022-12-26 MED ORDER — SODIUM CHLORIDE 0.9 % IV SOLN: 250.0000 mL | INTRAVENOUS | Status: DC

## 2022-12-26 MED ORDER — FENTANYL CITRATE (PF) 250 MCG/5ML IJ SOLN
INTRAMUSCULAR | Status: DC | PRN
  Administered 2022-12-26 (×4): 50 ug via INTRAVENOUS

## 2022-12-26 MED ORDER — KETAMINE HCL 50 MG/ML IJ SOLN
INTRAMUSCULAR | Status: DC | PRN
  Administered 2022-12-26: 10 mg via INTRAMUSCULAR
  Administered 2022-12-26: 30 mg via INTRAMUSCULAR
  Administered 2022-12-26: 10 mg via INTRAMUSCULAR

## 2022-12-26 MED ORDER — ACETAMINOPHEN 10 MG/ML IV SOLN
INTRAVENOUS | Status: DC | PRN
  Administered 2022-12-26: 1000 mg via INTRAVENOUS

## 2022-12-26 MED ORDER — MEPERIDINE HCL 25 MG/ML IJ SOLN: 6.2500 mg | INTRAMUSCULAR | Status: DC | PRN

## 2022-12-26 MED ORDER — MECLIZINE HCL 25 MG PO TABS: 25.0000 mg | ORAL_TABLET | Freq: Three times a day (TID) | ORAL | Status: DC | PRN

## 2022-12-26 MED ORDER — ACETAMINOPHEN 325 MG PO TABS
650.0000 mg | ORAL_TABLET | ORAL | Status: DC | PRN
  Administered 2022-12-27 (×2): 650 mg via ORAL
  Filled 2022-12-26 (×2): qty 2

## 2022-12-26 MED ORDER — FLEET ENEMA 7-19 GM/118ML RE ENEM: 1.0000 | ENEMA | Freq: Once | RECTAL | Status: DC | PRN

## 2022-12-26 MED ORDER — ORAL CARE MOUTH RINSE: 15.0000 mL | Freq: Once | OROMUCOSAL | Status: AC

## 2022-12-26 MED ORDER — SUCCINYLCHOLINE CHLORIDE 200 MG/10ML IV SOSY
PREFILLED_SYRINGE | INTRAVENOUS | Status: AC
  Filled 2022-12-26: qty 10

## 2022-12-26 MED ORDER — EMPAGLIFLOZIN 25 MG PO TABS: 25.0000 mg | ORAL_TABLET | Freq: Every day | ORAL | Status: DC

## 2022-12-26 MED ORDER — CEFAZOLIN SODIUM-DEXTROSE 2-4 GM/100ML-% IV SOLN
2.0000 g | INTRAVENOUS | Status: AC
  Administered 2022-12-26: 2 g via INTRAVENOUS
  Filled 2022-12-26: qty 100

## 2022-12-26 MED ORDER — DOCUSATE SODIUM 100 MG PO CAPS
100.0000 mg | ORAL_CAPSULE | Freq: Two times a day (BID) | ORAL | Status: DC
  Administered 2022-12-27: 100 mg via ORAL
  Filled 2022-12-26 (×2): qty 1

## 2022-12-26 MED ORDER — THROMBIN 5000 UNITS EX SOLR
CUTANEOUS | Status: AC
  Filled 2022-12-26: qty 5000

## 2022-12-26 MED ORDER — PHENYLEPHRINE 80 MCG/ML (10ML) SYRINGE FOR IV PUSH (FOR BLOOD PRESSURE SUPPORT): PREFILLED_SYRINGE | INTRAVENOUS | Status: DC | PRN

## 2022-12-26 MED ORDER — POTASSIUM CHLORIDE CRYS ER 20 MEQ PO TBCR: 20.0000 meq | EXTENDED_RELEASE_TABLET | Freq: Every day | ORAL | Status: DC

## 2022-12-26 MED ORDER — BUPIVACAINE HCL (PF) 0.5 % IJ SOLN
INTRAMUSCULAR | Status: DC | PRN
  Administered 2022-12-26: 10 mL
  Administered 2022-12-26: 20 mL

## 2022-12-26 MED ORDER — CHLORHEXIDINE GLUCONATE 0.12 % MT SOLN
15.0000 mL | Freq: Once | OROMUCOSAL | Status: AC
  Administered 2022-12-26: 15 mL via OROMUCOSAL
  Filled 2022-12-26: qty 15

## 2022-12-26 MED ORDER — ONDANSETRON HCL 4 MG PO TABS: 4.0000 mg | ORAL_TABLET | Freq: Four times a day (QID) | ORAL | Status: DC | PRN

## 2022-12-26 MED ORDER — HYDROMORPHONE HCL 1 MG/ML IJ SOLN: 0.5000 mg | INTRAMUSCULAR | Status: DC | PRN

## 2022-12-26 MED ORDER — LACTATED RINGERS IV SOLN: INTRAVENOUS | Status: DC | PRN

## 2022-12-26 MED ORDER — PROPOFOL 500 MG/50ML IV EMUL
INTRAVENOUS | Status: DC | PRN
  Administered 2022-12-26: 50 ug/kg/min via INTRAVENOUS

## 2022-12-26 MED ORDER — MIDAZOLAM HCL 2 MG/2ML IJ SOLN
INTRAMUSCULAR | Status: DC | PRN
  Administered 2022-12-26: 2 mg via INTRAVENOUS

## 2022-12-26 MED ORDER — POTASSIUM CHLORIDE IN NACL 20-0.9 MEQ/L-% IV SOLN: INTRAVENOUS | Status: DC

## 2022-12-26 MED ORDER — DEXAMETHASONE SODIUM PHOSPHATE 10 MG/ML IJ SOLN
INTRAMUSCULAR | Status: AC
  Filled 2022-12-26: qty 1

## 2022-12-26 MED ORDER — PHENYLEPHRINE 80 MCG/ML (10ML) SYRINGE FOR IV PUSH (FOR BLOOD PRESSURE SUPPORT)
PREFILLED_SYRINGE | INTRAVENOUS | Status: AC
  Filled 2022-12-26: qty 10

## 2022-12-26 MED ORDER — LIDOCAINE-EPINEPHRINE 1 %-1:100000 IJ SOLN
INTRAMUSCULAR | Status: DC | PRN
  Administered 2022-12-26: 10 mL
  Administered 2022-12-26: 8 mL

## 2022-12-26 MED ORDER — SODIUM CHLORIDE 0.9% FLUSH
3.0000 mL | Freq: Two times a day (BID) | INTRAVENOUS | Status: DC
  Administered 2022-12-26: 3 mL via INTRAVENOUS

## 2022-12-26 MED ORDER — PROPOFOL 1000 MG/100ML IV EMUL
INTRAVENOUS | Status: AC
  Filled 2022-12-26: qty 200

## 2022-12-26 MED ORDER — FENOFIBRATE 160 MG PO TABS: 160.0000 mg | ORAL_TABLET | Freq: Every day | ORAL | Status: DC

## 2022-12-26 MED ORDER — ESCITALOPRAM OXALATE 20 MG PO TABS: 20.0000 mg | ORAL_TABLET | Freq: Every day | ORAL | Status: DC

## 2022-12-26 MED ORDER — PRAMIPEXOLE DIHYDROCHLORIDE 0.25 MG PO TABS: 0.2500 mg | ORAL_TABLET | Freq: Every day | ORAL | Status: DC

## 2022-12-26 MED ORDER — LIDOCAINE 2% (20 MG/ML) 5 ML SYRINGE
INTRAMUSCULAR | Status: DC | PRN
  Administered 2022-12-26: 60 mg via INTRAVENOUS

## 2022-12-26 MED ORDER — PROPOFOL 10 MG/ML IV BOLUS
INTRAVENOUS | Status: DC | PRN
  Administered 2022-12-26: 150 mg via INTRAVENOUS

## 2022-12-26 MED ORDER — KETOCONAZOLE 2 % EX SHAM: 1.0000 | MEDICATED_SHAMPOO | CUTANEOUS | Status: DC

## 2022-12-26 MED ORDER — LIDOCAINE 2% (20 MG/ML) 5 ML SYRINGE
INTRAMUSCULAR | Status: AC
  Filled 2022-12-26: qty 5

## 2022-12-26 MED ORDER — VITAMIN D 25 MCG (1000 UNIT) PO TABS: 5000.0000 [IU] | ORAL_TABLET | Freq: Every day | ORAL | Status: DC

## 2022-12-26 MED ORDER — ONDANSETRON HCL 4 MG/2ML IJ SOLN
INTRAMUSCULAR | Status: DC | PRN
  Administered 2022-12-26: 4 mg via INTRAVENOUS

## 2022-12-26 MED ORDER — PHENOL 1.4 % MT LIQD: 1.0000 | OROMUCOSAL | Status: DC | PRN

## 2022-12-26 MED ORDER — OXYCODONE HCL 5 MG PO TABS
10.0000 mg | ORAL_TABLET | ORAL | Status: DC | PRN
  Administered 2022-12-26 – 2022-12-27 (×5): 10 mg via ORAL
  Filled 2022-12-26 (×5): qty 2

## 2022-12-26 MED ORDER — DEXAMETHASONE SODIUM PHOSPHATE 10 MG/ML IJ SOLN
INTRAMUSCULAR | Status: DC | PRN
  Administered 2022-12-26: 5 mg via INTRAVENOUS

## 2022-12-26 MED ORDER — FENTANYL CITRATE (PF) 250 MCG/5ML IJ SOLN
INTRAMUSCULAR | Status: AC
  Filled 2022-12-26: qty 5

## 2022-12-26 MED ORDER — ALBUTEROL SULFATE (2.5 MG/3ML) 0.083% IN NEBU: 3.0000 mL | INHALATION_SOLUTION | Freq: Four times a day (QID) | RESPIRATORY_TRACT | Status: DC | PRN

## 2022-12-26 MED ORDER — OXYCODONE HCL 5 MG PO TABS: 5.0000 mg | ORAL_TABLET | ORAL | Status: DC | PRN

## 2022-12-26 MED ORDER — ONDANSETRON HCL 4 MG/2ML IJ SOLN: 4.0000 mg | Freq: Four times a day (QID) | INTRAMUSCULAR | Status: DC | PRN

## 2022-12-26 SURGICAL SUPPLY — 85 items
ADH SKN CLS APL DERMABOND .7 (GAUZE/BANDAGES/DRESSINGS) ×2
BAG COUNTER SPONGE SURGICOUNT (BAG) ×2 IMPLANT
BAG SPNG CNTER NS LX DISP (BAG) ×2
BAND INSRT 18 STRL LF DISP RB (MISCELLANEOUS)
BAND RUBBER #18 3X1/16 STRL (MISCELLANEOUS) ×2 IMPLANT
BLADE CLIPPER SURG (BLADE) IMPLANT
BUR PRECISION FLUTE 5.0 (BURR) IMPLANT
CANISTER SUCT 3000ML PPV (MISCELLANEOUS) ×1 IMPLANT
CAP PUSHER PTP (ORTHOPEDIC DISPOSABLE SUPPLIES) IMPLANT
CLIP SPRING STIM LLIF SAFEOP (CLIP) IMPLANT
DERMABOND ADVANCED .7 DNX12 (GAUZE/BANDAGES/DRESSINGS) ×1 IMPLANT
DILATOR INSULATED LLIF 8-13-18 (NEUROSURGERY SUPPLIES) IMPLANT
DISSECTOR BLUNT TIP ENDO 5MM (MISCELLANEOUS) IMPLANT
DRAPE C-ARM 42X72 X-RAY (DRAPES) ×3 IMPLANT
DRAPE C-ARMOR (DRAPES) ×1 IMPLANT
DRAPE LAPAROTOMY 100X72X124 (DRAPES) ×2 IMPLANT
DRAPE ORTHO SPLIT 77X108 STRL (DRAPES) ×2
DRAPE SURG 17X23 STRL (DRAPES) ×1 IMPLANT
DRAPE SURG ORHT 6 SPLT 77X108 (DRAPES) IMPLANT
DRSG OPSITE POSTOP 3X4 (GAUZE/BANDAGES/DRESSINGS) IMPLANT
DURAPREP 26ML APPLICATOR (WOUND CARE) ×2 IMPLANT
ELECT BLADE INSULATED 6.5IN (ELECTROSURGICAL) ×1
ELECT KIT SAFEOP SSEP/SURF (KITS) ×1
ELECT REM PT RETURN 9FT ADLT (ELECTROSURGICAL) ×1
ELECTRODE BLDE INSULATED 6.5IN (ELECTROSURGICAL) ×1 IMPLANT
ELECTRODE KT SAFEOP SSEP/SURF (KITS) IMPLANT
ELECTRODE REM PT RTRN 9FT ADLT (ELECTROSURGICAL) ×2 IMPLANT
GAUZE 4X4 16PLY ~~LOC~~+RFID DBL (SPONGE) IMPLANT
GAUZE SPONGE 4X4 12PLY STRL (GAUZE/BANDAGES/DRESSINGS) IMPLANT
GLOVE BIO SURGEON STRL SZ7 (GLOVE) ×4 IMPLANT
GLOVE BIOGEL PI IND STRL 7.0 (GLOVE) ×2 IMPLANT
GLOVE BIOGEL PI IND STRL 7.5 (GLOVE) ×4 IMPLANT
GLOVE ECLIPSE 7.5 STRL STRAW (GLOVE) ×2 IMPLANT
GLOVE EXAM NITRILE XL STR (GLOVE) IMPLANT
GOWN STRL REUS W/ TWL LRG LVL3 (GOWN DISPOSABLE) ×2 IMPLANT
GOWN STRL REUS W/ TWL XL LVL3 (GOWN DISPOSABLE) ×5 IMPLANT
GOWN STRL REUS W/TWL 2XL LVL3 (GOWN DISPOSABLE) IMPLANT
GOWN STRL REUS W/TWL LRG LVL3 (GOWN DISPOSABLE) ×1
GOWN STRL REUS W/TWL XL LVL3 (GOWN DISPOSABLE) ×3
GRAFT BONE PROTEIOS LRG 5CC (Orthopedic Implant) IMPLANT
GRAFT BONE PROTEIOS MED 2.5CC (Orthopedic Implant) IMPLANT
GUIDEWIRE LLIF TT 310 (WIRE) IMPLANT
GUIDEWIRE TROC TIP NIT 18 (WIRE) IMPLANT
HEMOSTAT POWDER KIT SURGIFOAM (HEMOSTASIS) ×1 IMPLANT
KIT BASIN OR (CUSTOM PROCEDURE TRAY) ×2 IMPLANT
KIT TURNOVER KIT B (KITS) ×2 IMPLANT
KNIFE ANNULOTOMY (BLADE) IMPLANT
LIF ILLUMINATION SYSTEM STERIL (SYSTAGENIX WOUND MANAGEMENT) ×1
MATRIX STRIP NEOCORE 12C (Putty) IMPLANT
MODULE POSITIONING LIF 2.0 (INSTRUMENTS) IMPLANT
NDL HYPO 18GX1.5 BLUNT FILL (NEEDLE) IMPLANT
NDL HYPO 21X1.5 SAFETY (NEEDLE) ×1 IMPLANT
NEEDLE HYPO 18GX1.5 BLUNT FILL (NEEDLE) IMPLANT
NEEDLE HYPO 21X1.5 SAFETY (NEEDLE) ×1 IMPLANT
NEEDLE HYPO 22GX1.5 SAFETY (NEEDLE) ×2 IMPLANT
NS IRRIG 1000ML POUR BTL (IV SOLUTION) ×2 IMPLANT
PACK LAMINECTOMY NEURO (CUSTOM PROCEDURE TRAY) ×2 IMPLANT
PAD ARMBOARD 7.5X6 YLW CONV (MISCELLANEOUS) ×3 IMPLANT
PROBE BALL TIP LLIF SAFEOP (NEUROSURGERY SUPPLIES) IMPLANT
ROD TI MIS LORDOTIC V12 5.5X45 (Rod) IMPLANT
SCREW CANN FENS 6.5X45 (Screw) IMPLANT
SET SCREW (Screw) ×4 IMPLANT
SET SCREW SPNE (Screw) IMPLANT
SHIM INTRADISCAL PTP WIDE (ORTHOPEDIC DISPOSABLE SUPPLIES) IMPLANT
SOL ELECTROSURG ANTI STICK (MISCELLANEOUS) ×2
SOLUTION ELECTROSURG ANTI STCK (MISCELLANEOUS) ×2 IMPLANT
SPACER IDENTITI LIF 8X18X50 10 (Spacer) IMPLANT
SPIKE FLUID TRANSFER (MISCELLANEOUS) ×2 IMPLANT
SPONGE SURGIFOAM ABS GEL SZ50 (HEMOSTASIS) IMPLANT
SPONGE T-LAP 4X18 ~~LOC~~+RFID (SPONGE) IMPLANT
SPONGE TONSIL 1 RF SGL (DISPOSABLE) IMPLANT
STAPLER VISISTAT 35W (STAPLE) ×1 IMPLANT
SUT MNCRL AB 4-0 PS2 18 (SUTURE) ×2 IMPLANT
SUT VIC AB 0 CT1 18XCR BRD8 (SUTURE) ×1 IMPLANT
SUT VIC AB 0 CT1 8-18 (SUTURE) ×2
SUT VIC AB 2-0 CP2 18 (SUTURE) ×2 IMPLANT
SYR 20ML LL LF (SYRINGE) ×1 IMPLANT
SYR 30ML LL (SYRINGE) IMPLANT
SYR 5ML LL (SYRINGE) IMPLANT
SYSTEM GUIDED ACCESS ARCUS BT (NEUROSURGERY SUPPLIES) IMPLANT
SYSTEM ILLUMINATION LIF STERIL (SYSTAGENIX WOUND MANAGEMENT) IMPLANT
TOWEL GREEN STERILE (TOWEL DISPOSABLE) ×2 IMPLANT
TOWEL GREEN STERILE FF (TOWEL DISPOSABLE) ×2 IMPLANT
TRAY FOLEY MTR SLVR 16FR STAT (SET/KITS/TRAYS/PACK) ×1 IMPLANT
WATER STERILE IRR 1000ML POUR (IV SOLUTION) ×2 IMPLANT

## 2022-12-26 NOTE — Progress Notes (Signed)
Orthopedic Tech Progress Note Patient Details:  Teresa Soto [birth year] [id number]  Patient ID: Teresa Soto, female   DOB: [birth year], 67 y.o.   MRN: [id number] Patient already has brace. Karolee Stamps 12/26/2022, 9:01 PM

## 2022-12-26 NOTE — Transfer of Care (Signed)
Immediate Anesthesia Transfer of Care Note  Patient: Teresa Soto  Procedure(s) Performed: Direct Lateral Interbody Fusion Lumbar three-four, Right  Prone  Transpsoas (Right) LUMBAR PERCUTANEOUS PEDICLE SCREW LUMBAR THREE-FOUR (Right)  Patient Location: PACU  Anesthesia Type:General  Level of Consciousness: awake and alert   Airway & Oxygen Therapy: Patient Spontanous Breathing and Patient connected to face mask oxygen  Post-op Assessment: Report given to RN and Post -op Vital signs reviewed and stable  Post vital signs: Reviewed and stable  Last Vitals:  Vitals Value Taken Time  BP 104/65 12/26/22 1209  Temp    Pulse 82 12/26/22 1212  Resp 18 12/26/22 1212  SpO2 98 % 12/26/22 1212  Vitals shown include unvalidated device data.  Last Pain:  Vitals:   12/26/22 0555  TempSrc:   PainSc: 0-No pain         Complications: No notable events documented.

## 2022-12-26 NOTE — H&P (Signed)
CC: back pain  HPI:     Patient is a 67 y.o. female with back pain and leg pain.  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    Patient Active Problem List   Diagnosis Date Noted   Memory changes 12/21/2022   Chronic musculoskeletal pain 11/29/2022   Type 2 diabetes mellitus with stage 3a chronic kidney disease, without long-term current use of insulin ([place]) 11/09/2021   Hypercalcemia 11/09/2021   Encounter for annual physical exam 07/11/2021   Depression, recurrent ([place]) 05/13/2021   Recurrent depression ([place]) 05/13/2021   Metatarsal stress fracture with routine healing 12/30/2018   Diabetes mellitus without complication ([place]) 0000000   Osteoporosis with current pathological fracture 04/03/2016   Arthritis of knee, right 08/12/2014   Primary osteoarthritis of right knee 07/27/2014   OA (osteoarthritis) of knee 06/16/2014   Primary hyperparathyroidism ([place]) 06/16/2014   Tinea versicolor 06/16/2014   Body mass index (BMI) 33.0-33.9, adult 04/06/2013   Lack of coordination 10/21/2012   History of cerebrovascular accident 10/10/2012   Vitamin D deficiency 11/10/2011   Hearing loss 06/29/2011   Carotid bruit 06/29/2011   Lumbar back pain with radiculopathy affecting left lower extremity 06/28/2010   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/04/2010   PERIPHERAL EDEMA 04/08/2009   Peripheral edema 04/08/2009   DEGENERATIVE JOINT DISEASE 03/23/2008   Mixed hyperlipidemia 11/18/2007   Insomnia 10/17/2007   Asthma 11/30/2006   Essential hypertension 09/05/2006   ARTHRITIS 09/05/2006   Vertigo 09/05/2006   Arthropathy 09/05/2006   Past Medical History:  Diagnosis Date   Allergy Hayfever, dust, peppers   Arthritis of knee, right 08/12/2014   Asthma    [employer] 06/16/2020   Chronic back pain    Chronic constipation    Depression    Diabetes mellitus without complication ([place])    [employer] 06/16/2020   Heart murmur    History of bronchitis    History of urinary tract  infection    Hyperlipidemia    [employer] 06/16/2020   Hypertension    Meniere disease    vertigo   Neuropathic pain    Osteoporosis    [employer] 06/16/2020   Parathyroid adenoma    Poor fine motor skills    secondary to CVA per right side    PTSD (post-traumatic stress disorder)    Restless legs syndrome 2007 approx   Seasonal allergies    Shortness of breath dyspnea    coldness    Stroke ([place]) 2015    Past Surgical History:  Procedure Laterality Date   BACK SURGERY  2012   L1-L2  x2   CESAREAN SECTION     x 2   JOINT REPLACEMENT     PARATHYROIDECTOMY N/A 06/27/2016   Procedure: MINIMALLY INVASIVE PARATHYROIDECTOMY;  Surgeon: Greer Pickerel, MD;  Location: [facility];  Service: General;  Laterality: N/A;   PARATHYROIDECTOMY Right 02/09/2022   Procedure: PARATHYROIDECTOMY;  Surgeon: Kinsinger, Arta Bruce, MD;  Location: [facility];  Service: General;  Laterality: Right;   SHOULDER ARTHROSCOPY W/ ROTATOR CUFF REPAIR  2010   to right shoulder   SPINE SURGERY  12/2010   TONSILLECTOMY     TOTAL KNEE ARTHROPLASTY Right 08/12/2014   Procedure: RIGHT TOTAL KNEE ARTHROPLASTY;  Surgeon: Carole Civil, MD;  Location: [facility];  Service: Orthopedics;  Laterality: Right;   TUBAL LIGATION  1999    Medications Prior to Admission  Medication Sig Dispense Refill Last Dose   albuterol (VENTOLIN HFA) 108 (90 Base) MCG/ACT inhaler Inhale 2 puffs into  the lungs every 6 (six) hours as needed for wheezing or shortness of breath. 8 g 3 Past Week   alendronate (FOSAMAX) 70 MG tablet Take 1 tablet by mouth every 7 days. Take with a full glass of water on an empty stomach. 4 tablet 3 Past Week   aspirin 81 MG EC tablet Take 81 mg by mouth daily.   Past Week   atorvastatin (LIPITOR) 40 MG tablet Take 1 tablet (40 mg total) by mouth daily. 90 tablet 3 12/25/2022   Cholecalciferol 125 MCG (5000 UT) capsule Take 5,000 Units by mouth daily.      empagliflozin (JARDIANCE) 25 MG TABS tablet Take 1 tablet (25 mg  total) by mouth daily before breakfast. 30 tablet 2 12/26/2022   escitalopram (LEXAPRO) 20 MG tablet Take 1 tablet (20 mg total) by mouth daily. 90 tablet 1 12/26/2022   fenofibrate (TRICOR) 145 MG tablet Take 1 tablet (145 mg total) by mouth daily. 90 tablet 0 12/26/2022   ferrous sulfate 325 (65 FE) MG EC tablet Take 325 mg by mouth every morning.   Past Week   gabapentin (NEURONTIN) 300 MG capsule Take 1 capsule (300 mg total) by mouth 2 (two) times daily. 180 capsule 0 12/25/2022   ibuprofen (ADVIL) 800 MG tablet Take 1 tablet (800 mg total) by mouth every 8 (eight) hours as needed. 30 tablet 0 Past Week   ketoconazole (NIZORAL) 2 % shampoo Apply 1 application topically 2 (two) times a week. 120 mL 0 Past Week   lisinopril (ZESTRIL) 10 MG tablet Take 1 tablet by mouth daily. 90 tablet 1 12/26/2022   meclizine (ANTIVERT) 25 MG tablet Take 25 mg by mouth 3 (three) times daily as needed for dizziness.   12/26/2022   montelukast (SINGULAIR) 10 MG tablet Take 1 tablet by mouth every evening. 90 tablet 1 12/25/2022   naproxen sodium (ALEVE) 220 MG tablet Take 220 mg by mouth in the morning.   Past Week   ondansetron (ZOFRAN) 4 MG tablet Take 4 mg by mouth every 8 (eight) hours as needed for vomiting or nausea.   Past Month   polyethylene glycol powder (GLYCOLAX/MIRALAX) 17 GM/SCOOP powder Take 17 g by mouth every other day.   Past Month   potassium chloride SA (KLOR-CON M) 20 MEQ tablet Take 1 tablet by mouth daily. 60 tablet 0 Past Month   pramipexole (MIRAPEX) 0.25 MG tablet Take 1 tablet by mouth daily with breakfast. 90 tablet 0 Past Week   Blood Glucose Monitoring Suppl (BLOOD GLUCOSE SYSTEM PAK) KIT Please dispense based on patient and insurance preference. Use as directed to monitor FSBS 1x daily. Dx: E11.9. 1 each 1    Lancet Devices MISC Please dispense based on patient and insurance preference. Use as directed to monitor FSBS 1x daily. Dx: E11.9. 1 each 1    Lancets MISC Please dispense based on  patient and insurance preference. Use as directed to monitor FSBS 1x daily. Dx: E11.9. 100 each 1    Allergies  Allergen Reactions   Fluticasone     headache   Influenza Vaccines Rash   Other Nausea And Vomiting    Peppers    Pneumococcal Vaccine Rash    Had rash, given both flu and PNA at same time    Pneumococcal Vaccines Rash    Had rash, given both flu and PNA at same time     Social History   Tobacco Use   Smoking status: Former    Packs/day: 2.00  Years: 6.00    Additional pack years: 0.00    Total pack years: 12.00    Types: Cigarettes    Quit date: 10/09/1982    Years since quitting: 40.2    Passive exposure: Past   Smokeless tobacco: Never   Tobacco comments:    quit in 1984  Substance Use Topics   Alcohol use: Yes    Alcohol/week: 2.0 standard drinks of alcohol    Types: 1 Glasses of wine, 1 Cans of beer per week    Family History  Problem Relation Age of Onset   Hypertension Mother    Hyperlipidemia Mother    Alcohol abuse Mother    Arthritis Mother    Alcohol abuse Father    Dementia Paternal Grandmother    Alcohol abuse Maternal Grandfather    Hearing loss Maternal Grandfather    Heart disease Maternal Grandfather    Hypertension Maternal Grandfather    Depression Maternal Grandmother    ADD / ADHD Son    Learning disabilities Son    Alcohol abuse Sister    Arthritis Sister    Alcohol abuse Paternal Uncle    Diabetes Maternal Aunt    Obesity Maternal Aunt      Review of Systems Pertinent items noted in HPI and remainder of comprehensive ROS otherwise negative.  Objective:   Patient Vitals for the past 8 hrs:  BP Temp Temp src Pulse Resp SpO2 Height Weight  12/26/22 0551 119/78 98.3 F (36.8 C) Oral 77 20 98 % 5\' 1"  (1.549 m) 73.9 kg   No intake/output data recorded. No intake/output data recorded.      General : Alert, cooperative, no distress, appears stated age   Head:  Normocephalic/atraumatic    Eyes: PERRL,  conjunctiva/corneas clear, EOM's intact. Fundi could not be visualized Neck: Supple Chest:  Respirations unlabored Chest wall: no tenderness or deformity Heart: Regular rate and rhythm Abdomen: Soft, nontender and nondistended Extremities: warm and well-perfused Skin: normal turgor, color and texture Neurologic:  Alert, oriented x 3.  Eyes open spontaneously. PERRL, EOMI, VFC, no facial droop. V1-3 intact.  No dysarthria, tongue protrusion symmetric.  CNII-XII intact. Normal strength, sensation and reflexes throughout.  No pronator drift, full strength in legs       Data ReviewCBC:  Lab Results  Component Value Date   WBC 6.8 12/22/2022   RBC 5.17 (H) 12/22/2022   BMP:  Lab Results  Component Value Date   GLUCOSE 230 (H) 12/22/2022   GLUCOSE 77 05/23/2017   CO2 24 12/22/2022   BUN 23 12/22/2022   BUN 26 11/23/2022   CREATININE 1.00 12/22/2022   CREATININE 1.18 (H) 06/17/2020   CALCIUM 10.0 12/22/2022   Radiology review:  See clinic note for details  Assessment:   Active Problems:   * No active hospital problems. 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

## 2022-12-26 NOTE — Anesthesia Preprocedure Evaluation (Signed)
Anesthesia Evaluation  Patient identified by MRN, date of birth, ID band Patient awake    Reviewed: Allergy & Precautions, NPO status , Patient's Chart, lab work & pertinent test results  History of Anesthesia Complications (+) PONV and history of anesthetic complications  Airway Mallampati: IV  TM Distance: >3 FB Neck ROM: Full    Dental  (+) Dental Advisory Given, Teeth Intact   Pulmonary shortness of breath and with exertion, asthma , former smoker Quit smoking 1984, 12 pack year history  Snores at night, has never been tested for sleep apnea    Pulmonary exam normal breath sounds clear to auscultation       Cardiovascular hypertension, Pt. on medications Normal cardiovascular exam Rhythm:Regular Rate:Normal     Neuro/Psych  PSYCHIATRIC DISORDERS Anxiety Depression    CVA (2015, poor fine motor and sensation R side), Residual Symptoms    GI/Hepatic negative GI ROS, Neg liver ROS,,,  Endo/Other  diabetes, Well Controlled, Type 2, Oral Hypoglycemic Agents  Parathyroid adenoma  a1c 6.6, FS 130 in preop   Renal/GU negative Renal ROS  negative genitourinary   Musculoskeletal  (+) Arthritis , Osteoarthritis,    Abdominal  (+) + obese  Peds negative pediatric ROS (+)  Hematology negative hematology ROS (+) Hb 15.1   Anesthesia Other Findings Uses cane when out and about   Reproductive/Obstetrics negative OB ROS                             Anesthesia Physical Anesthesia Plan  ASA: 3  Anesthesia Plan: General   Post-op Pain Management: Dilaudid IV   Induction: Intravenous  PONV Risk Score and Plan: 4 or greater and Ondansetron, Dexamethasone, Midazolam, Treatment may vary due to age or medical condition and Droperidol  Airway Management Planned: Oral ETT  Additional Equipment: None  Intra-op Plan:   Post-operative Plan: Extubation in OR  Informed Consent: I have reviewed the  patients History and Physical, chart, labs and discussed the procedure including the risks, benefits and alternatives for the proposed anesthesia with the patient or authorized representative who has indicated his/her understanding and acceptance.     Dental advisory given  Plan Discussed with: CRNA  Anesthesia Plan Comments:         Anesthesia Quick Evaluation

## 2022-12-26 NOTE — Anesthesia Postprocedure Evaluation (Signed)
Anesthesia Post Note  Patient: Teresa Soto  Procedure(s) Performed: Direct Lateral Interbody Fusion Lumbar three-four, Right  Prone  Transpsoas (Right) LUMBAR PERCUTANEOUS PEDICLE SCREW LUMBAR THREE-FOUR (Right)     Patient location during evaluation: PACU Anesthesia Type: General Level of consciousness: awake and alert Pain management: pain level controlled Vital Signs Assessment: post-procedure vital signs reviewed and stable Respiratory status: spontaneous breathing, nonlabored ventilation and respiratory function stable Cardiovascular status: blood pressure returned to baseline and stable Postop Assessment: no apparent nausea or vomiting Anesthetic complications: no   No notable events documented.  Last Vitals:  Vitals:   12/26/22 1315 12/26/22 1330  BP: 124/79 137/70  Pulse: 82 83  Resp: 20 18  Temp:    SpO2: 93% 97%    Last Pain:  Vitals:   12/26/22 1330  TempSrc:   PainSc: Asleep    LLE Motor Response: Purposeful movement (12/26/22 1330)   RLE Motor Response: Purposeful movement (12/26/22 1330)        Lynda Rainwater

## 2022-12-26 NOTE — Anesthesia Procedure Notes (Signed)
Procedure Name: Intubation Date/Time: 12/26/2022 8:13 AM  Performed by: Inda Coke, CRNAPre-anesthesia Checklist: Patient identified, Emergency Drugs available, Suction available, Timeout performed and Patient being monitored Patient Re-evaluated:Patient Re-evaluated prior to induction Oxygen Delivery Method: Circle system utilized Preoxygenation: Pre-oxygenation with 100% oxygen Induction Type: IV induction Ventilation: Mask ventilation without difficulty Laryngoscope Size: Mac and 3 Grade View: Grade I Tube type: Oral Tube size: 7.0 mm Number of attempts: 1 Airway Equipment and Method: Stylet Placement Confirmation: ETT inserted through vocal cords under direct vision, positive ETCO2, CO2 detector and breath sounds checked- equal and bilateral Secured at: 21 cm Tube secured with: Tape Dental Injury: Teeth and Oropharynx as per pre-operative assessment

## 2022-12-26 NOTE — Op Note (Addendum)
PREOP DIAGNOSIS: L3-4 stenosis and listhesis  POSTOP DIAGNOSIS: L3-4 stenosis and listhesis  PROCEDURE: 1. L3-4 lateral lumbar interbody fusion via right prone transpsoas retroperitoneal approach with tubular retractors 2. Posterolateral arthrodesis, L3-4 3. Placement of interbody cage L3-4 4. Nonsegmental instrumentation with percutaneously placed pedicle screw and rod construct at L3-4 5. Harvest of local autograft 6. Use of morselized allograft 7. Use of microscope for microdissection  SURGEON: Dr. Duffy Rhody, MD  ASSISTANT: Caroline More, PA.  Please note, there were no qualified trainees available to assist with the procedure.  An assistant was required for aid in retraction of the neural elements.   ANESTHESIA: General Endotracheal  EBL: 50 ml  IMPLANTS:  Alpha-Tec 11/8 x 18 x 50 mm IdentiTi cage 6.5 x 45 mm pedicle screws x 4 45 mm rod x 2 12 ml Neocore with ProteiOs  SPECIMENS: None  DRAINS: None  COMPLICATIONS: none  CONDITION: Stable to PACU  HISTORY: Teresa Soto is a 67 y.o. female with hx of previous posterior lumbar spinal surgery who developed worsening mechanical back pain and left greater than right radiculopathy.  She was found to have severe disc degeneration with asymmetric disc space collapse and severe stenosis at L3-4, with lateral listhesis . Treatment options were discussed and the patient elected to proceed with DLIF at L3-4. risks, benefits, alternatives, and expected convalescence were discussed with the patient.  Risks discussed included but were not limited to bleeding, pain, infection, scar, pseudoarthrosis, CSF leak, neurologic deficit, paralysis, and death.  The patient wished to proceed with surgery and informed consent was obtained.  PROCEDURE IN DETAIL: After informed consent was obtained and witnessed, the patient was brought to the operating room. After induction of general anesthesia, the patient was positioned on the operative  table in the prone position on a Jackson table with all pressure points meticulously padded. TThe patient and the bed was positioned for a true lateral and AP C-arm x-rays at each level.  The flank was preprepped with alcohol and prepped and draped in sterile fashion.  A timeout was performed.  1% at lidocaine with epinephrine was injected in the planned incision.  Using the C-arm x-ray, a right oblique incision was planned over the flank over the L3-4 disc.  Incision was made with a 10 blade and the fascia was opened.  The external oblique, internal oblique, and transversalis muscle and transversalis fascia were opened bluntly with spreading instruments.  The retroperitoneal space was dissected and the peritoneal contents were reflected anteriorly.  The quadratus lumborum, transverse process and finally the psoas muscle was dissected out.  A initial dilator was placed on the lateral surface of the psoas muscle over the L3-4 disc space as confirmed by x-ray.  The dilator was then passed through the psoas muscle and docked onto the disc.  Stimulation showed there was no proximity of any lumbar plexus nerves.  The dilator was secured in place with a K wire and subsequent dilators were passed and stimulated and again yielded no neural activity nearby.  Retractor was then passed onto the disc space and secured in place with a bed attachment.  The dilators were removed and the field on the posterior blade were directly stimulated.  There was no visual evidence or electromyographic evidence of nerves in the field.  An anterior shim was placed.  The retractor was then opened cranially, caudally, and anteriorly and again the field was stimulated with no evidence of nerves in the field.  Annulotomy was performed with  a retractable blade and discectomy was performed with pituitary rongeurs and box cutters.  The contralateral annulus was released with Cobb and the disc space was prepared with curettes and rasps.  Trial  implants were placed and with the aid of fluoroscopy with AP and lateral projections, a size 11/8 mm graft was placed.  The retractor was then removed.    There were no changes in the femoral nerve SSEPs throughout the case.  Final x-rays showed good reduction of spondylolisthesis and lateral listhesis, restoration of disc space height and foraminal height, and correction of her coronal deformity.  The retroperitoneal space was then irrigated and examined with no evidence of any bleeding.  The fascia was closed with 0 Vicryl stitches.  The dermal layer was closed with 2-0 Vicryl stitches.  Skin was closed with 4-0 Monocryl subcuticular manner followed by Dermabond.   Under fluoroscopy, the pedicles of the correct levels were identified and marked out on the skin, and the skin was infiltrated with local anesthetic.  A 10 blade was used to incise the skin and the fascia overlying the pedicles on AP fluoroscopy.  Jamshidi's were then placed at the lateral aspect of the pedicle is identified AP fluoroscopy and malleted into the body under x-ray guidance.  C-arm was then switched to lateral which confirmed entry into the body.  K wires were then passed through the Jamshidi's and the sheaths were removed.   The pedicles were tapped over the K wires under C-arm guidance and size 6.5 mm pedicle screws were placed at each level under fluoroscopic guidance.  Good purchase was noted.  Rods were then passed through the screw towers bilaterally and secured with screw caps.  X-ray confirmed good placement.  The screw caps were final tightened and the towers removed.  Currette was used to decorticate the facet joints and bone graft was placed in the lateral gutter.  Wounds were irrigated thoroughly with bacitracin impregnated irrigation.  Exparel mixed with Marcaine was injected into the paraspinous muscles and subcutaneous tissues bilaterally.  The fascia was closed with 0 Vicryl stitches.  The dermal layer was closed with  2-0 Vicryl stitches in buried interrupted fashion.  The skin incisions were closed with 4-0 Monocryl subcuticular manner followed by Dermabond.  Sterile dressings were placed.  Patient was then flipped supine and extubated by the anesthesia service following commands and all 4 extremities.  All counts were correct at the end of surgery.  No complications were noted.

## 2022-12-27 LAB — GLUCOSE, CAPILLARY
Glucose-Capillary: 118 mg/dL — ABNORMAL HIGH (ref 70–99)
Glucose-Capillary: 197 mg/dL — ABNORMAL HIGH (ref 70–99)

## 2022-12-27 MED ORDER — OXYCODONE-ACETAMINOPHEN 10-325 MG PO TABS: 1.0000 | ORAL_TABLET | ORAL | 0 refills | Status: AC | PRN

## 2022-12-27 NOTE — Evaluation (Signed)
Physical Therapy Evaluation  Patient Details Name: Teresa Soto MRN: [id number] DOB: 13-Jul-1956 Today's Date: 12/27/2022  History of Present Illness  Pt is a 67 y/o female who presents s/p L3-L4 lateral lumbar interbody fusion on 12/26/2022. PMH significant for DM, HTN, Meniere's Disease, osteoporosis, parathyroid adenoma s/p parathyroidextomy, PTSD, CVA, prior back surgery 2012, R knee replacement 2015, R rotator cuff repair 2010.   Clinical Impression  Pt admitted with above diagnosis. At the time of PT eval, pt was able to demonstrate transfers and ambulation with gross min guard assist to supervision for safety and RW for support. Pt was educated on precautions, brace application/wearing schedule, appropriate activity progression, and car transfer. Pt currently with functional limitations due to the deficits listed below (see PT Problem List). Pt will benefit from skilled PT to increase their independence and safety with mobility to allow discharge to the venue listed below.         Recommendations for follow up therapy are one component of a multi-disciplinary discharge planning process, led by the attending physician.  Recommendations may be updated based on patient status, additional functional criteria and insurance authorization.  Follow Up Recommendations No PT follow up      Assistance Recommended at Discharge PRN  Patient can return home with the following  A little help with walking and/or transfers;A little help with bathing/dressing/bathroom;Assistance with cooking/housework;Assist for transportation;Help with stairs or ramp for entrance    Equipment Recommendations Rolling walker (2 wheels)  Recommendations for Other Services       Functional Status Assessment Patient has had a recent decline in their functional status and demonstrates the ability to make significant improvements in function in a reasonable and predictable amount of time.     Precautions / Restrictions  Precautions Precautions: Fall;Back Precaution Booklet Issued: Yes (comment) Precaution Comments: Reviewed handout and pt was cued for precautions during functional mobility. Required Braces or Orthoses: Spinal Brace Spinal Brace: Lumbar corset;Applied in sitting position Restrictions Weight Bearing Restrictions: No      Mobility  Bed Mobility Overal bed mobility: Needs Assistance Bed Mobility: Rolling, Sidelying to Sit, Sit to Sidelying Rolling: Min assist Sidelying to sit: Supervision     Sit to sidelying: Supervision General bed mobility comments: HOB flat and rails lowered to simulate home environment. Pt reaching for therapist's hand to pull herself onto her R side. Able to transition to/from sitting without assist.    Transfers Overall transfer level: Needs assistance Equipment used: Rolling walker (2 wheels) Transfers: Sit to/from Stand Sit to Stand: Supervision           General transfer comment: VC's for hand placement on seated surface for safety.    Ambulation/Gait Ambulation/Gait assistance: Supervision Gait Distance (Feet): 250 Feet Assistive device: Rolling walker (2 wheels) Gait Pattern/deviations: Step-through pattern, Decreased stride length, Trunk flexed Gait velocity: Decreased Gait velocity interpretation: <1.8 ft/sec, indicate of risk for recurrent falls   General Gait Details: VC's for improved posture, closer walker proximity, and forward gaze. No assist required.  Stairs Stairs: Yes Stairs assistance: Min guard Stair Management: One rail Right, Step to pattern, Forwards, Alternating pattern Number of Stairs: 10 General stair comments: VC's for sequencing and general safety. Pt alternating feet to ascend despite pain and cues for step-to pattern. Alternating on descent and reports no pain.  Wheelchair Mobility    Modified Rankin (Stroke Patients Only)       Balance Overall balance assessment: No apparent balance deficits (not formally  assessed)  Pertinent Vitals/Pain Pain Assessment Pain Assessment: Faces Faces Pain Scale: Hurts even more Pain Location: back Pain Descriptors / Indicators: Discomfort, Grimacing, Guarding Pain Intervention(s): Monitored during session, Limited activity within patient's tolerance, Repositioned    Home Living Family/patient expects to be discharged to:: Private residence Living Arrangements: Spouse/significant other Available Help at Discharge: Family;Available 24 hours/day Type of Home: House Home Access: Stairs to enter Entrance Stairs-Rails: Right;Left;Can reach both Entrance Stairs-Number of Steps: 3 Alternate Level Stairs-Number of Steps: flight with landing Home Layout: Two level;Bed/bath upstairs;Able to live on main level with bedroom/bathroom Home Equipment: Rolling Walker (2 wheels);BSC/3in1 Additional Comments: main bed/bath upstairs, able to stay on 1st level if needed    Prior Function Prior Level of Function : Independent/Modified Independent             Mobility Comments: indep, SPC intermittently ADLs Comments: mod I     Hand Dominance   Dominant Hand: Right    Extremity/Trunk Assessment   Upper Extremity Assessment Upper Extremity Assessment: Overall WFL for tasks assessed    Lower Extremity Assessment Lower Extremity Assessment: RLE deficits/detail RLE Deficits / Details: Acute pain and decreased strength consistent with above mentioned surgery.    Cervical / Trunk Assessment Cervical / Trunk Assessment: Back Surgery  Communication   Communication: No difficulties  Cognition Arousal/Alertness: Awake/alert Behavior During Therapy: WFL for tasks assessed/performed Overall Cognitive Status: Within Functional Limits for tasks assessed                                          General Comments General comments (skin integrity, edema, etc.): VSS on RA    Exercises      Assessment/Plan    PT Assessment Patient needs continued PT services  PT Problem List Decreased strength;Decreased range of motion;Decreased activity tolerance;Decreased balance;Decreased mobility;Decreased knowledge of use of DME;Decreased safety awareness;Decreased knowledge of precautions;Pain       PT Treatment Interventions DME instruction;Gait training;Stair training;Functional mobility training;Therapeutic activities;Therapeutic exercise;Balance training;Patient/family education    PT Goals (Current goals can be found in the Care Plan section)  Acute Rehab PT Goals Patient Stated Goal: Feel better PT Goal Formulation: With patient/family Time For Goal Achievement: 01/03/23 Potential to Achieve Goals: Good    Frequency Min 5X/week     Co-evaluation               AM-PAC PT "6 Clicks" Mobility  Outcome Measure Help needed turning from your back to your side while in a flat bed without using bedrails?: A Little Help needed moving from lying on your back to sitting on the side of a flat bed without using bedrails?: A Little Help needed moving to and from a bed to a chair (including a wheelchair)?: A Little Help needed standing up from a chair using your arms (e.g., wheelchair or bedside chair)?: A Little Help needed to walk in hospital room?: A Little Help needed climbing 3-5 steps with a railing? : A Little 6 Click Score: 18    End of Session Equipment Utilized During Treatment: Gait belt;Back brace Activity Tolerance: Patient tolerated treatment well Patient left: in bed;with call bell/phone within reach;with family/visitor present Nurse Communication: Mobility status PT Visit Diagnosis: Unsteadiness on feet (R26.81);Pain Pain - part of body:  (back, R leg)    Time: 1025-1050 PT Time Calculation (min) (ACUTE ONLY): 25 min   Charges:   PT Evaluation $PT Eval Low  Complexity: 1 Low PT Treatments $Gait Training: 8-22 mins        Rolinda Roan, PT,  DPT Acute Rehabilitation Services Secure Chat Preferred Office: [phone]   Thelma Comp 12/27/2022, 11:27 AM

## 2022-12-27 NOTE — Evaluation (Signed)
Occupational Therapy Evaluation Patient Details Name: Teresa Soto MRN: [id number] DOB: Jul 28, 1956 Today's Date: 12/27/2022   History of Present Illness Teresa Soto is a 67 yo female who underwent : Direct Lateral Interbody Fusion Lumbar three-four, Right  Prone Transpsoas (Right) & LUMBAR PERCUTANEOUS PEDICLE SCREW LUMBAR THREE-FOUR (Right) 3/19.   Clinical Impression   Teresa Soto was evaluated s/p the above spine surgery. She is mod I at baseline. Upon evaluation she was limited by back pain, precautions and decreased activity tolerance. Overall she needed generalized supervision A for ADLs and mobility and RW with cues to maintain back precautions. Provided cues and education on spinal precautions and compensatory techniques throughout, handout provided and pt demonstrated great recall. Pt does not require further acute OT services. Recommend d/c home with support of family.        Recommendations for follow up therapy are one component of a multi-disciplinary discharge planning process, led by the attending physician.  Recommendations may be updated based on patient status, additional functional criteria and insurance authorization.   Follow Up Recommendations  No OT follow up     Assistance Recommended at Discharge Intermittent Supervision/Assistance  Patient can return home with the following A little help with walking and/or transfers;A little help with bathing/dressing/bathroom;Assistance with cooking/housework;Assist for transportation;Help with stairs or ramp for entrance    Functional Status Assessment  Patient has had a recent decline in their functional status and demonstrates the ability to make significant improvements in function in a reasonable and predictable amount of time.  Equipment Recommendations  None recommended by OT       Precautions / Restrictions Precautions Precautions: Fall;Back Precaution Booklet Issued: Yes (comment) Required Braces or Orthoses:  Spinal Brace Spinal Brace: Lumbar corset Restrictions Weight Bearing Restrictions: No      Mobility Bed Mobility Overal bed mobility: Needs Assistance Bed Mobility: Rolling, Sidelying to Sit Rolling: Supervision Sidelying to sit: Supervision            Transfers Overall transfer level: Needs assistance Equipment used: Rolling walker (2 wheels) Transfers: Sit to/from Stand Sit to Stand: Supervision                  Balance Overall balance assessment: No apparent balance deficits (not formally assessed)                                         ADL either performed or assessed with clinical judgement   ADL Overall ADL's : Needs assistance/impaired                                       General ADL Comments: Generalized supervision A with cues to use compensatory techniques - pt with great return demonstration during ADls     Vision Baseline Vision/History: 1 Wears glasses Vision Assessment?: No apparent visual deficits     Perception Perception Perception Tested?: No   Praxis Praxis Praxis tested?: Not tested    Pertinent Vitals/Pain Pain Assessment Pain Assessment: Faces Faces Pain Scale: Hurts even more Pain Location: back Pain Descriptors / Indicators: Discomfort, Grimacing, Guarding Pain Intervention(s): Limited activity within patient's tolerance     Hand Dominance Right   Extremity/Trunk Assessment Upper Extremity Assessment Upper Extremity Assessment: Overall WFL for tasks assessed   Lower Extremity Assessment Lower Extremity Assessment: Defer to PT  evaluation   Cervical / Trunk Assessment Cervical / Trunk Assessment: Back Surgery   Communication Communication Communication: No difficulties   Cognition Arousal/Alertness: Awake/alert Behavior During Therapy: WFL for tasks assessed/performed Overall Cognitive Status: Within Functional Limits for tasks assessed                                  General Comments: verbose     General Comments  VSS on RA    Exercises     Shoulder Instructions      Home Living Family/patient expects to be discharged to:: Private residence Living Arrangements: Spouse/significant other Available Help at Discharge: Family;Available 24 hours/day Type of Home: House       Home Layout: Two level;Bed/bath upstairs;Able to live on main level with bedroom/bathroom Alternate Level Stairs-Number of Steps: flight with landing   Bathroom Shower/Tub: Walk-in shower;Tub only   Bathroom Toilet: Standard     Home Equipment: Conservation officer, nature (2 wheels);BSC/3in1   Additional Comments: main bed/bath upstairs, able to stay on 1st level if needed      Prior Functioning/Environment Prior Level of Function : Independent/Modified Independent             Mobility Comments: indep, SPC intermittently ADLs Comments: mod I        OT Problem List: Decreased range of motion;Decreased strength;Impaired balance (sitting and/or standing);Decreased activity tolerance;Decreased knowledge of use of DME or AE;Decreased knowledge of precautions      OT Treatment/Interventions:      OT Goals(Current goals can be found in the care plan section) Acute Rehab OT Goals Patient Stated Goal: home OT Goal Formulation: With patient Time For Goal Achievement: 12/27/22 Potential to Achieve Goals: Good  OT Frequency:      Co-evaluation              AM-PAC OT "6 Clicks" Daily Activity     Outcome Measure Help from another person eating meals?: None Help from another person taking care of personal grooming?: A Little Help from another person toileting, which includes using toliet, bedpan, or urinal?: A Little Help from another person bathing (including washing, rinsing, drying)?: A Little Help from another person to put on and taking off regular upper body clothing?: None Help from another person to put on and taking off regular lower body clothing?: A  Little 6 Click Score: 20   End of Session Equipment Utilized During Treatment: Rolling walker (2 wheels);Back brace Nurse Communication: Mobility status  Activity Tolerance: Patient tolerated treatment well Patient left: in bed;with call bell/phone within reach  OT Visit Diagnosis: Unsteadiness on feet (R26.81);Other abnormalities of gait and mobility (R26.89);Muscle weakness (generalized) (M62.81)                Time: [id number] OT Time Calculation (min): 20 min Charges:  OT General Charges $OT Visit: 1 Visit OT Evaluation $OT Eval Moderate Complexity: 1 Mod  Shade Flood, OTR/L Acute Rehabilitation Services Office [phone] Secure Chat Communication Preferred   Elliot Cousin 12/27/2022, 10:32 AM

## 2022-12-27 NOTE — Discharge Summary (Signed)
Discharge Summary   Date of Admission: 12/26/22   Date of Discharge: 12/27/22   Attending Physician: Charlett Blake, MD   Hospital Course: Patient was admitted following an uncomplicated [id number] DLIF. They were recovered in PACU and transferred to [facility]. Their preop symptoms were improved. Pt to f/u in office in 2 weeks for post op visit. Pt is in agreement w/ plan.    Neurologic exam at discharge:  A&O x3 Strength 5/5 x4.  SILTx4.  Incisions c/d/I.  Pt ambulating without difficulty.      Discharge diagnosis: Lumbar stenosis with radiculopathy.    Caroline More, [facility]

## 2022-12-27 NOTE — Plan of Care (Signed)
Pt doing well. Pt and husband given D/C instructions with verbal understanding. Rx's were sent to the pharmacy by MD. Pt's incision is clean and dry with no sign of infection. Pt's IV was removed prior to D/C. Pt received RW from [employer] per MD order. Pt D/C'd home via wheelchair per MD order. Pt is stable @ D/C and has no other needs at this time. Holli Humbles, RN

## 2022-12-28 ENCOUNTER — Telehealth: Payer: Self-pay

## 2022-12-28 NOTE — Transitions of Care (Post Inpatient/ED Visit) (Signed)
   12/28/2022  Name: Teresa Soto MRN: [id number] DOB: [birth year]  Today's TOC FU Call Status: Today's TOC FU Call Status:: Successful TOC FU Call Competed TOC FU Call Complete Date: 12/28/22  Transition Care Management Follow-up Telephone Call Date of Discharge: 12/27/22 Discharge [facility]) Type of Discharge: Inpatient Admission Primary Inpatient Discharge Diagnosis:: Lumbar Stenosis with Radiculopathy How have you been since you were released from the hospital?: Same (patient notes she is having pain, taking Percocet q 4 hours) Any questions or concerns?: No  Items Reviewed: Did you receive and understand the discharge instructions provided?: Yes Medications obtained and verified?: Yes (Medications Reviewed) Any new allergies since your discharge?: No Dietary orders reviewed?: No Do you have support at home?: Yes People in Home: spouse Name of Support/Comfort Primary Source: [employer] and Equipment/Supplies: [place] Ordered?: No Any new equipment or medical supplies ordered?: No  Functional Questionnaire: Do you need assistance with bathing/showering or dressing?: Yes Do you need assistance with meal preparation?: Yes Do you need assistance with eating?: No Do you have difficulty maintaining continence: No Do you need assistance with getting out of bed/getting out of a chair/moving?: Yes Do you have difficulty managing or taking your medications?: No  Follow up appointments reviewed: PCP Follow-up appointment confirmed?: Yes Date of PCP follow-up appointment?: 01/18/23 Follow-up Provider: Dr. Doren Custard Specialist [facility] Follow-up appointment confirmed?: No Reason Specialist Follow-Up Not Confirmed: Patient has Specialist Provider Number and will Call for Appointment (Patient waiting for call from surgeon) Do you need transportation to your follow-up appointment?: No Do you understand care options if your condition(s) worsen?:  Yes-patient verbalized understanding  Johnney Killian, RN, BSN, CCM Care Management Coordinator [facility] Phone: [phone]: [phone]

## 2022-12-31 ENCOUNTER — Encounter (HOSPITAL_COMMUNITY): Payer: Self-pay | Admitting: Neurosurgery

## 2023-01-02 ENCOUNTER — Encounter: Payer: Self-pay | Admitting: Internal Medicine

## 2023-01-02 ENCOUNTER — Other Ambulatory Visit: Payer: Self-pay

## 2023-01-02 MED ORDER — PRAMIPEXOLE DIHYDROCHLORIDE 0.25 MG PO TABS: 0.2500 mg | ORAL_TABLET | Freq: Every day | ORAL | 0 refills | Status: DC

## 2023-01-02 NOTE — Telephone Encounter (Signed)
Called patient and got medications fixed.

## 2023-01-09 ENCOUNTER — Ambulatory Visit: Payer: Medicare Other | Admitting: "Endocrinology

## 2023-01-09 ENCOUNTER — Encounter: Payer: Self-pay | Admitting: "Endocrinology

## 2023-01-09 VITALS — BP 114/62 | HR 84 | Ht 61.0 in | Wt 161.6 lb

## 2023-01-09 DIAGNOSIS — I10 Essential (primary) hypertension: Secondary | ICD-10-CM

## 2023-01-09 DIAGNOSIS — E212 Other hyperparathyroidism: Secondary | ICD-10-CM | POA: Diagnosis not present

## 2023-01-09 DIAGNOSIS — M8000XA Age-related osteoporosis with current pathological fracture, unspecified site, initial encounter for fracture: Secondary | ICD-10-CM

## 2023-01-09 NOTE — Progress Notes (Signed)
01/09/2023, 4:04 PM  Endocrinology follow-up note   Subjective:    Patient ID: Teresa Soto, female    DOB: 07-Jul-1956.  Teresa Soto is being seen in follow-up after she was seen in consultation for management of primary hyperparathyroidism, osteoporosis, hyperlipidemia:   She wishes to follow-up with her PMD for diabetes care.   Past Medical History:  Diagnosis Date   Allergy Hayfever, dust, peppers   Arthritis of knee, right 08/12/2014   Asthma    Phreesia 06/16/2020   Chronic back pain    Chronic constipation    Depression    Diabetes mellitus without complication    Phreesia 06/16/2020   Heart murmur    History of bronchitis    History of urinary tract infection    Hyperlipidemia    Phreesia 06/16/2020   Hypertension    Meniere disease    vertigo   Neuropathic pain    Osteoporosis    Phreesia 06/16/2020   Parathyroid adenoma    Poor fine motor skills    secondary to CVA per right side    PTSD (post-traumatic stress disorder)    Restless legs syndrome 2007 approx   Seasonal allergies    Shortness of breath dyspnea    coldness    Stroke 2015    Past Surgical History:  Procedure Laterality Date   ANTERIOR LAT LUMBAR FUSION Right 12/26/2022   Procedure: Direct Lateral Interbody Fusion Lumbar three-four, Right  Prone  Transpsoas;  Surgeon: Vallarie Mare, MD;  Location: [facility];  Service: Neurosurgery;  Laterality: Right;   BACK SURGERY  2012   L1-L2  x2   CESAREAN SECTION     x 2   JOINT REPLACEMENT     LUMBAR PERCUTANEOUS PEDICLE SCREW 1 LEVEL Right 12/26/2022   Procedure: LUMBAR PERCUTANEOUS PEDICLE SCREW LUMBAR THREE-FOUR;  Surgeon: Vallarie Mare, MD;  Location: [facility];  Service: Neurosurgery;  Laterality: Right;   PARATHYROIDECTOMY N/A 06/27/2016   Procedure: MINIMALLY INVASIVE PARATHYROIDECTOMY;  Surgeon: Greer Pickerel, MD;  Location: [facility];  Service: General;   Laterality: N/A;   PARATHYROIDECTOMY Right 02/09/2022   Procedure: PARATHYROIDECTOMY;  Surgeon: Kinsinger, Arta Bruce, MD;  Location: [facility];  Service: General;  Laterality: Right;   SHOULDER ARTHROSCOPY W/ ROTATOR CUFF REPAIR  2010   to right shoulder   SPINE SURGERY  12/2010   TONSILLECTOMY     TOTAL KNEE ARTHROPLASTY Right 08/12/2014   Procedure: RIGHT TOTAL KNEE ARTHROPLASTY;  Surgeon: Carole Civil, MD;  Location: [facility];  Service: Orthopedics;  Laterality: Right;   TUBAL LIGATION  1999    Social History   Socioeconomic History   Marital status: Married    Spouse name: Doneen Bellanca   Number of children: Not on file   Years of education: 12   Highest education level: 12th grade  Occupational History   Not on file  Tobacco Use   Smoking status: Former    Packs/day: 2.00    Years: 6.00    Additional pack years: 0.00    Total pack years: 12.00    Types: Cigarettes    Quit  date: 10/09/1982    Years since quitting: 40.2    Passive exposure: Past   Smokeless tobacco: Never   Tobacco comments:    quit in 1984  Vaping Use   Vaping Use: Former  Substance and Sexual Activity   Alcohol use: Yes    Alcohol/week: 2.0 standard drinks of alcohol    Types: 1 Glasses of wine, 1 Cans of beer per week   Drug use: No   Sexual activity: Yes    Partners: Male    Birth control/protection: Post-menopausal, Surgical  Other Topics Concern   Not on file  Social History Narrative   Not on file   Social Determinants of Health   Financial Resource Strain: Low Risk  (06/21/2022)   Overall Financial Resource Strain (CARDIA)    Difficulty of Paying Living Expenses: Not hard at all  Food Insecurity: No Food Insecurity (12/01/2022)   Hunger Vital Sign    Worried About Running Out of Food in the Last Year: Never true    [place] in the Last Year: Never true  Transportation Needs: No Transportation Needs (12/01/2022)   PRAPARE - Transportation    Lack of Transportation  (Medical): No    Lack of Transportation (Non-Medical): No  Physical Activity: Inactive (06/21/2022)   Exercise Vital Sign    Days of Exercise per Week: 0 days    Minutes of Exercise per Session: 0 min  Stress: No Stress Concern Present (06/21/2022)   [place]    Feeling of Stress : Only a little  Social Connections: Socially Integrated (06/21/2022)   Social Connection and Isolation Panel [NHANES]    Frequency of Communication with Friends and Family: More than three times a week    Frequency of Social Gatherings with Friends and Family: More than three times a week    Attends Religious Services: More than 4 times per year    Active Member of [employer] or Organizations: Yes    Attends Music therapist: More than 4 times per year    Marital Status: Married    Family History  Problem Relation Age of Onset   Hypertension Mother    Hyperlipidemia Mother    Alcohol abuse Mother    Arthritis Mother    Alcohol abuse Father    Dementia Paternal Grandmother    Alcohol abuse Maternal Grandfather    Hearing loss Maternal Grandfather    Heart disease Maternal Grandfather    Hypertension Maternal Grandfather    Depression Maternal Grandmother    ADD / ADHD Son    Learning disabilities Son    Alcohol abuse Sister    Arthritis Sister    Alcohol abuse Paternal Uncle    Diabetes Maternal Aunt    Obesity Maternal Aunt     Outpatient Encounter Medications as of 01/09/2023  Medication Sig   albuterol (VENTOLIN HFA) 108 (90 Base) MCG/ACT inhaler Inhale 2 puffs into the lungs every 6 (six) hours as needed for wheezing or shortness of breath.   alendronate (FOSAMAX) 70 MG tablet Take 1 tablet by mouth every 7 days. Take with a full glass of water on an empty stomach.   aspirin EC 81 MG tablet Take 1 tablet (81 mg total) by mouth daily.   atorvastatin (LIPITOR) 40 MG tablet Take 1 tablet (40 mg total) by mouth daily.    Blood Glucose Monitoring Suppl (BLOOD GLUCOSE SYSTEM PAK) KIT Please dispense based on patient and insurance preference. Use  as directed to monitor FSBS 1x daily. Dx: E11.9.   Cholecalciferol 125 MCG (5000 UT) capsule Take 5,000 Units by mouth daily.   empagliflozin (JARDIANCE) 25 MG TABS tablet Take 1 tablet (25 mg total) by mouth daily before breakfast.   escitalopram (LEXAPRO) 20 MG tablet Take 1 tablet (20 mg total) by mouth daily.   fenofibrate (TRICOR) 145 MG tablet Take 1 tablet (145 mg total) by mouth daily.   ferrous sulfate 325 (65 FE) MG EC tablet Take 325 mg by mouth every morning.   gabapentin (NEURONTIN) 300 MG capsule Take 1 capsule (300 mg total) by mouth 2 (two) times daily.   ketoconazole (NIZORAL) 2 % shampoo Apply 1 application topically 2 (two) times a week.   Lancet Devices MISC Please dispense based on patient and insurance preference. Use as directed to monitor FSBS 1x daily. Dx: E11.9.   Lancets MISC Please dispense based on patient and insurance preference. Use as directed to monitor FSBS 1x daily. Dx: E11.9.   lisinopril (ZESTRIL) 10 MG tablet Take 1 tablet by mouth daily.   meclizine (ANTIVERT) 25 MG tablet Take 25 mg by mouth 3 (three) times daily as needed for dizziness.   montelukast (SINGULAIR) 10 MG tablet Take 1 tablet by mouth every evening.   ondansetron (ZOFRAN) 4 MG tablet Take 4 mg by mouth every 8 (eight) hours as needed for vomiting or nausea.   polyethylene glycol powder (GLYCOLAX/MIRALAX) 17 GM/SCOOP powder Take 17 g by mouth every other day.   potassium chloride SA (KLOR-CON M) 20 MEQ tablet Take 1 tablet by mouth daily.   pramipexole (MIRAPEX) 0.25 MG tablet Take 1 tablet (0.25 mg total) by mouth daily with breakfast.   No facility-administered encounter medications on file as of 01/09/2023.    ALLERGIES: Allergies  Allergen Reactions   Fluticasone     headache   Influenza Vaccines Rash   Other Nausea And Vomiting    Peppers    Pneumococcal  Vaccine Rash    Had rash, given both flu and PNA at same time    Pneumococcal Vaccines Rash    Had rash, given both flu and PNA at same time     VACCINATION STATUS: Immunization History  Administered Date(s) Administered   Influenza Whole 06/28/2010   PFIZER(Purple Top)SARS-COV-2 Vaccination 03/09/2020, 04/08/2020   Td 03/30/2010   Tdap 01/02/2013    Osteoporosis: She is currently on Fosamax 70 mg p.o. weekly.  She continues to tolerate the medication.  Her recent bone density shows slight improvement of bone density compared to the last study.  Hyperlipidemia This is a chronic problem. The current episode started more than 1 year ago. Exacerbating diseases include diabetes. She has no history of obesity. Pertinent negatives include no myalgias or shortness of breath. Current antihyperlipidemic treatment includes fibric acid derivatives. Risk factors for coronary artery disease include diabetes mellitus, dyslipidemia, hypertension, a sedentary lifestyle, post-menopausal, family history and obesity.   Primary hyperparathyroidism: After appropriate work-up, she was sent for parathyroidectomy.  She did have this procedure done by Dr. Harlow Asa on Feb 09, 2022.  Right superior gland was removed, surgical pathology confirming an adenoma.  Her previsit labs show stable calcium and PTH.     Objective:       01/09/2023    1:41 PM 12/27/2022    7:19 AM 12/27/2022    5:56 AM  Vitals with BMI  Height 5\' 1"     Weight 161 lbs 10 oz    BMI 30.55  Systolic [id number]  Diastolic 62 72 81  Pulse 84 105 96    BP 114/62   Pulse 84   Ht 5\' 1"  (1.549 m)   Wt 161 lb 9.6 oz (73.3 kg)   BMI 30.53 kg/m   Wt Readings from Last 3 Encounters:  01/09/23 161 lb 9.6 oz (73.3 kg)  12/26/22 163 lb (73.9 kg)  12/22/22 162 lb 11.2 oz (73.8 kg)         CMP ( most recent) CMP     Component Value Date/Time   NA 136 12/22/2022 1005   NA 138 11/23/2022 1113   K 3.7 12/22/2022 1005   CL 102  12/22/2022 1005   CO2 24 12/22/2022 1005   GLUCOSE 230 (H) 12/22/2022 1005   GLUCOSE 77 05/23/2017 0946   BUN 23 12/22/2022 1005   BUN 26 11/23/2022 1113   CREATININE 1.00 12/22/2022 1005   CREATININE 1.18 (H) 06/17/2020 1117   CALCIUM 10.0 12/22/2022 1005   PROT 7.4 11/23/2022 1113   ALBUMIN 4.8 11/23/2022 1113   AST 14 11/23/2022 1113   ALT 13 11/23/2022 1113   ALKPHOS 40 (L) 11/23/2022 1113   BILITOT 0.3 11/23/2022 1113   GFRNONAA >60 12/22/2022 1005   GFRNONAA 49 (L) 06/17/2020 1117   GFRAA 57 (L) 06/17/2020 1117     Diabetic Labs (most recent): Lab Results  Component Value Date   HGBA1C 7.6 (H) 11/23/2022   HGBA1C 6.7 (H) 02/09/2022   HGBA1C 6.6 (H) 01/09/2022   MICROALBUR 2.0 06/17/2020   MICROALBUR 0.8 08/05/2018   MICROALBUR 1.8 03/28/2017     Lipid Panel ( most recent) Lipid Panel     Component Value Date/Time   CHOL 203 (H) 11/23/2022 1113   TRIG 170 (H) 11/23/2022 1113   HDL 45 11/23/2022 1113   CHOLHDL 4.5 (H) 11/23/2022 1113   CHOLHDL 3.8 06/17/2020 1117   VLDL 47 (H) 03/28/2017 0840   LDLCALC 128 (H) 11/23/2022 1113   LDLCALC 108 (H) 06/17/2020 1117   LABVLDL 30 11/23/2022 1113      Lab Results  Component Value Date   TSH 0.809 11/23/2022   TSH 0.67 06/17/2020   TSH 1.48 11/22/2016   TSH 0.798 07/28/2015   TSH 0.488 11/07/2011   TSH 1.102 12/03/2008   TSH 1.102 12/03/2008   TSH 1.350 10/22/2007   FREET4 1.24 11/23/2022   FREET4 1.2 06/17/2020           Assessment & Plan:   1) hyperparathyroidism/hypercalcemia -She is status post right upper  parathyroidectomy for hyperparathyroidism/hypercalcemia complicated by osteoporosis,  history of nephrolithiasis.   She has a stable course recently. She previously did have recurrent disease, has stable course recently after right superior gland was removed by surgery confirming parathyroid adenoma.   Her subsequent PTH/calcium are stable and within normal range.  No further intervention is  needed until she does have CMP before her next visit in a year.  2) osteoporosis: She is on alendronate 70 mg p.o. weekly.  She continues to tolerate this medication.  Her recent bone density showed treatment effect.  She is advised to continue this medication, 70 mg p.o. weekly.   She does have type 2 diabetes, hypertension, hyperlipidemia, wishes to follow-up with her PMD. - she is  advised to maintain close follow up with Doren Custard, Hazle Nordmann, MD for primary care needs, as well as her other providers for optimal and coordinated care.   I spent  22 minutes in the care of  the patient today including review of labs from Thyroid Function, CMP, and other relevant labs ; imaging/biopsy records (current and previous including abstractions from other facilities); face-to-face time discussing  her lab results and symptoms, medications doses, her options of short and long term treatment based on the latest standards of care / guidelines;   and documenting the encounter.  Doristine Church Behney  participated in the discussions, expressed understanding, and voiced agreement with the above plans.  All questions were answered to her satisfaction. she is encouraged to contact clinic should she have any questions or concerns prior to her return visit.   Follow up plan: - Return in about 1 year (around 01/09/2024) for F/U with Pre-visit Labs.  Glade Lloyd, MD [facility] [address] [place], [place] [address] Phone: [phone]  Fax: [phone]    01/09/2023, 4:04 PM  This note was partially dictated with voice recognition software. Similar sounding words can be transcribed inadequately or may not  be corrected upon review.

## 2023-01-16 ENCOUNTER — Other Ambulatory Visit: Payer: Self-pay

## 2023-01-16 ENCOUNTER — Encounter: Payer: Self-pay | Admitting: Internal Medicine

## 2023-01-16 DIAGNOSIS — B360 Pityriasis versicolor: Secondary | ICD-10-CM

## 2023-01-16 DIAGNOSIS — I10 Essential (primary) hypertension: Secondary | ICD-10-CM

## 2023-01-16 DIAGNOSIS — N1831 Chronic kidney disease, stage 3a: Secondary | ICD-10-CM

## 2023-01-16 DIAGNOSIS — J45909 Unspecified asthma, uncomplicated: Secondary | ICD-10-CM

## 2023-01-16 DIAGNOSIS — E119 Type 2 diabetes mellitus without complications: Secondary | ICD-10-CM

## 2023-01-16 DIAGNOSIS — E782 Mixed hyperlipidemia: Secondary | ICD-10-CM

## 2023-01-16 DIAGNOSIS — F339 Major depressive disorder, recurrent, unspecified: Secondary | ICD-10-CM

## 2023-01-16 DIAGNOSIS — M7918 Myalgia, other site: Secondary | ICD-10-CM

## 2023-01-16 MED ORDER — ATORVASTATIN CALCIUM 40 MG PO TABS: 40.0000 mg | ORAL_TABLET | Freq: Every day | ORAL | 3 refills | Status: DC

## 2023-01-16 MED ORDER — LISINOPRIL 10 MG PO TABS: 10.0000 mg | ORAL_TABLET | Freq: Every day | ORAL | 1 refills | Status: DC

## 2023-01-16 MED ORDER — FENOFIBRATE 145 MG PO TABS: 145.0000 mg | ORAL_TABLET | Freq: Every day | ORAL | 0 refills | Status: DC

## 2023-01-16 MED ORDER — LANCETS MISC: 1 refills | Status: AC

## 2023-01-16 MED ORDER — MONTELUKAST SODIUM 10 MG PO TABS: 10.0000 mg | ORAL_TABLET | Freq: Every evening | ORAL | 1 refills | Status: DC

## 2023-01-16 MED ORDER — GABAPENTIN 300 MG PO CAPS: 300.0000 mg | ORAL_CAPSULE | Freq: Two times a day (BID) | ORAL | 0 refills | Status: DC

## 2023-01-16 MED ORDER — ALENDRONATE SODIUM 70 MG PO TABS: ORAL_TABLET | ORAL | 3 refills | Status: DC

## 2023-01-16 MED ORDER — ONDANSETRON HCL 4 MG PO TABS: 4.0000 mg | ORAL_TABLET | Freq: Three times a day (TID) | ORAL | 0 refills | Status: DC | PRN

## 2023-01-16 MED ORDER — ALBUTEROL SULFATE HFA 108 (90 BASE) MCG/ACT IN AERS: 2.0000 | INHALATION_SPRAY | Freq: Four times a day (QID) | RESPIRATORY_TRACT | 3 refills | Status: DC | PRN

## 2023-01-16 MED ORDER — EMPAGLIFLOZIN 25 MG PO TABS: 25.0000 mg | ORAL_TABLET | Freq: Every day | ORAL | 2 refills | Status: DC

## 2023-01-16 MED ORDER — PRAMIPEXOLE DIHYDROCHLORIDE 0.25 MG PO TABS: 0.2500 mg | ORAL_TABLET | Freq: Every day | ORAL | 0 refills | Status: DC

## 2023-01-16 MED ORDER — ESCITALOPRAM OXALATE 20 MG PO TABS: 20.0000 mg | ORAL_TABLET | Freq: Every day | ORAL | 1 refills | Status: DC

## 2023-01-16 NOTE — Telephone Encounter (Signed)
Spoke with patient and sent meds to [facility].

## 2023-01-17 ENCOUNTER — Ambulatory Visit: Payer: Medicare Other | Admitting: Urology

## 2023-01-17 DIAGNOSIS — N200 Calculus of kidney: Secondary | ICD-10-CM

## 2023-01-17 DIAGNOSIS — R42 Dizziness and giddiness: Secondary | ICD-10-CM | POA: Diagnosis not present

## 2023-01-17 DIAGNOSIS — H9041 Sensorineural hearing loss, unilateral, right ear, 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: Secondary | ICD-10-CM | POA: Diagnosis not present

## 2023-01-18 ENCOUNTER — Ambulatory Visit (INDEPENDENT_AMBULATORY_CARE_PROVIDER_SITE_OTHER): Payer: Medicare Other | Admitting: Internal Medicine

## 2023-01-18 ENCOUNTER — Encounter: Payer: Self-pay | Admitting: Internal Medicine

## 2023-01-18 VITALS — BP 129/63 | Temp 98.6°F | Ht 62.0 in | Wt 164.0 lb

## 2023-01-18 DIAGNOSIS — Z1231 Encounter for screening mammogram for malignant neoplasm of breast: Secondary | ICD-10-CM | POA: Diagnosis not present

## 2023-01-18 DIAGNOSIS — M7918 Myalgia, other site: Secondary | ICD-10-CM | POA: Diagnosis not present

## 2023-01-18 DIAGNOSIS — E1122 Type 2 diabetes mellitus with diabetic chronic kidney disease: Secondary | ICD-10-CM | POA: Diagnosis not present

## 2023-01-18 DIAGNOSIS — E782 Mixed hyperlipidemia: Secondary | ICD-10-CM

## 2023-01-18 DIAGNOSIS — N1831 Chronic kidney disease, stage 3a: Secondary | ICD-10-CM

## 2023-01-18 DIAGNOSIS — G8929 Other chronic pain: Secondary | ICD-10-CM

## 2023-01-18 DIAGNOSIS — R058 Other specified cough: Secondary | ICD-10-CM | POA: Insufficient documentation

## 2023-01-18 DIAGNOSIS — R413 Other amnesia: Secondary | ICD-10-CM

## 2023-01-18 MED ORDER — PROMETHAZINE-DM 6.25-15 MG/5ML PO SYRP: 2.5000 mL | ORAL_SOLUTION | Freq: Four times a day (QID) | ORAL | 0 refills | Status: DC | PRN

## 2023-01-18 MED ORDER — GABAPENTIN 300 MG PO CAPS: 300.0000 mg | ORAL_CAPSULE | Freq: Two times a day (BID) | ORAL | 0 refills | Status: DC

## 2023-01-18 MED ORDER — BENZONATATE 100 MG PO CAPS: 100.0000 mg | ORAL_CAPSULE | Freq: Two times a day (BID) | ORAL | 0 refills | Status: DC | PRN

## 2023-01-18 NOTE — Progress Notes (Signed)
Established Patient Office Visit  Subjective   Patient ID: Teresa Soto, female    DOB: 25-Apr-1956  Age: 67 y.o. MRN: [id number]  Chief Complaint  Patient presents with   Cough    X tues and has a constant tickle in throat, has been taking alker seltzer with not much relief. No sinus congestion    Teresa Soto returns to care today for DM and HLD follow-up.  She was last evaluated by me on 3/14 at which time Jardiance was increased to 25 mg daily and atorvastatin 40 mg daily was started in setting of HLD with a prior history of CVA.  In the interim she underwent spinal fusion surgery on 3/19.  Her postoperative course has been uncomplicated.  She has also been seen by endocrinology for follow-up. Teresa Soto endorses a 2-day history of dry cough.  She states that this cough keeps her awake at night.  She is interested in additional treatment options today as over-the-counter cough/cold medications have not been effective.  She is otherwise asymptomatic and has no additional concerns to discuss.  She endorses significant symptomatic improvement since spinal fusion surgery.  Past Medical History:  Diagnosis Date   Allergy Hayfever, dust, peppers   Arthritis of knee, right 08/12/2014   Asthma    Phreesia 06/16/2020   Chronic back pain    Chronic constipation    Depression    Diabetes mellitus without complication    Phreesia 06/16/2020   Heart murmur    History of bronchitis    History of urinary tract infection    Hyperlipidemia    Phreesia 06/16/2020   Hypertension    Meniere disease    vertigo   Neuropathic pain    Osteoporosis    Phreesia 06/16/2020   Parathyroid adenoma    Poor fine motor skills    secondary to CVA per right side    PTSD (post-traumatic stress disorder)    Restless legs syndrome 2007 approx   Seasonal allergies    Shortness of breath dyspnea    coldness    Stroke 2015   Past Surgical History:  Procedure Laterality Date   ANTERIOR LAT LUMBAR FUSION  Right 12/26/2022   Procedure: Direct Lateral Interbody Fusion Lumbar three-four, Right  Prone  Transpsoas;  Surgeon: Bedelia Person, MD;  Location: [facility];  Service: Neurosurgery;  Laterality: Right;   BACK SURGERY  2012   L1-L2  x2   CESAREAN SECTION     x 2   JOINT REPLACEMENT     LUMBAR PERCUTANEOUS PEDICLE SCREW 1 LEVEL Right 12/26/2022   Procedure: LUMBAR PERCUTANEOUS PEDICLE SCREW LUMBAR THREE-FOUR;  Surgeon: Bedelia Person, MD;  Location: [facility];  Service: Neurosurgery;  Laterality: Right;   PARATHYROIDECTOMY N/A 06/27/2016   Procedure: MINIMALLY INVASIVE PARATHYROIDECTOMY;  Surgeon: Gaynelle Adu, MD;  Location[facility];  Service: General;  Laterality: N/A;   PARATHYROIDECTOMY Right 02/09/2022   Procedure: PARATHYROIDECTOMY;  Surgeon: Kinsinger, De Blanch, MD;  Location: [facility];  Service: General;  Laterality: Right;   SHOULDER ARTHROSCOPY W/ ROTATOR CUFF REPAIR  2010   to right shoulder   SPINE SURGERY  12/2010   TONSILLECTOMY     TOTAL KNEE ARTHROPLASTY Right 08/12/2014   Procedure: RIGHT TOTAL KNEE ARTHROPLASTY;  Surgeon: Vickki Hearing, MD;  Location: [facility];  Service: Orthopedics;  Laterality: Right;   TUBAL LIGATION  1999   Social History   Tobacco Use   Smoking status: Former    Packs/day: 2.00  Years: 6.00    Additional pack years: 0.00    Total pack years: 12.00    Types: Cigarettes    Quit date: 10/09/1982    Years since quitting: 40.3    Passive exposure: Past   Smokeless tobacco: Never   Tobacco comments:    quit in 1984  Vaping Use   Vaping Use: Former  Substance Use Topics   Alcohol use: Yes    Alcohol/week: 2.0 standard drinks of alcohol    Types: 1 Glasses of wine, 1 Cans of beer per week   Drug use: No   Family History  Problem Relation Age of Onset   Hypertension Mother    Hyperlipidemia Mother    Alcohol abuse Mother    Arthritis Mother    Alcohol abuse Father    Dementia Paternal Grandmother    Alcohol abuse Maternal Grandfather     Hearing loss Maternal Grandfather    Heart disease Maternal Grandfather    Hypertension Maternal Grandfather    Depression Maternal Grandmother    ADD / ADHD Son    Learning disabilities Son    Alcohol abuse Sister    Arthritis Sister    Alcohol abuse Paternal Uncle    Diabetes Maternal Aunt    Obesity Maternal Aunt    Allergies  Allergen Reactions   Fluticasone     headache   Influenza Vaccines Rash   Other Nausea And Vomiting    Peppers    Pneumococcal Vaccine Rash    Had rash, given both flu and PNA at same time    Pneumococcal Vaccines Rash    Had rash, given both flu and PNA at same time    Review of Systems  Respiratory:  Positive for cough. Negative for sputum production.   All other systems reviewed and are negative.    Objective:     BP 129/63   Temp 98.6 F (37 C) (Oral)   Ht 5\' 2"  (1.575 m)   Wt 164 lb (74.4 kg)   SpO2 96%   BMI 30.00 kg/m  BP Readings from Last 3 Encounters:  01/18/23 129/63  01/09/23 114/62  12/27/22 125/72   Physical Exam Vitals reviewed.  Constitutional:      General: She is not in acute distress.    Appearance: Normal appearance. She is obese. She is not toxic-appearing.  HENT:     Head: Normocephalic and atraumatic.     Right Ear: External ear normal.     Left Ear: External ear normal.     Nose: Nose normal. No congestion or rhinorrhea.     Mouth/Throat:     Mouth: Mucous membranes are moist.     Pharynx: Oropharynx is clear. No oropharyngeal exudate or posterior oropharyngeal erythema.  Eyes:     General: No scleral icterus.    Extraocular Movements: Extraocular movements intact.     Conjunctiva/sclera: Conjunctivae normal.     Pupils: Pupils are equal, round, and reactive to light.  Cardiovascular:     Rate and Rhythm: Normal rate and regular rhythm.     Pulses: Normal pulses.     Heart sounds: Normal heart sounds. No murmur heard.    No friction rub. No gallop.  Pulmonary:     Effort: Pulmonary effort is  normal.     Breath sounds: Normal breath sounds. No wheezing, rhonchi or rales.  Abdominal:     General: Abdomen is flat. Bowel sounds are normal. There is no distension.     Palpations: Abdomen is  soft.     Tenderness: There is no abdominal tenderness.  Musculoskeletal:        General: No swelling. Normal range of motion.     Cervical back: Normal range of motion.     Right lower leg: No edema.     Left lower leg: No edema.  Lymphadenopathy:     Cervical: No cervical adenopathy.  Skin:    General: Skin is warm and dry.     Capillary Refill: Capillary refill takes less than 2 seconds.     Coloration: Skin is not jaundiced.  Neurological:     General: No focal deficit present.     Mental Status: She is alert and oriented to person, place, and time.  Psychiatric:        Mood and Affect: Mood normal.        Behavior: Behavior normal.   Last CBC Lab Results  Component Value Date   WBC 6.8 12/22/2022   HGB 14.7 12/22/2022   HCT 44.6 12/22/2022   MCV 86.3 12/22/2022   MCH 28.4 12/22/2022   RDW 13.5 12/22/2022   PLT 329 12/22/2022   Last metabolic panel Lab Results  Component Value Date   GLUCOSE 230 (H) 12/22/2022   NA 136 12/22/2022   K 3.7 12/22/2022   CL 102 12/22/2022   CO2 24 12/22/2022   BUN 23 12/22/2022   CREATININE 1.00 12/22/2022   GFRNONAA >60 12/22/2022   CALCIUM 10.0 12/22/2022   PHOS 3.6 03/29/2022   PROT 7.4 11/23/2022   ALBUMIN 4.8 11/23/2022   LABGLOB 2.6 11/23/2022   AGRATIO 1.8 11/23/2022   BILITOT 0.3 11/23/2022   ALKPHOS 40 (L) 11/23/2022   AST 14 11/23/2022   ALT 13 11/23/2022   ANIONGAP 10 12/22/2022   Last lipids Lab Results  Component Value Date   CHOL 203 (H) 11/23/2022   HDL 45 11/23/2022   LDLCALC 128 (H) 11/23/2022   TRIG 170 (H) 11/23/2022   CHOLHDL 4.5 (H) 11/23/2022   Last hemoglobin A1c Lab Results  Component Value Date   HGBA1C 7.6 (H) 11/23/2022   Last thyroid functions Lab Results  Component Value Date   TSH  0.809 11/23/2022   Last vitamin D Lab Results  Component Value Date   VD25OH 46.6 11/23/2022   Last vitamin B12 and Folate Lab Results  Component Value Date   VITAMINB12 862 11/23/2022   FOLATE 13.7 11/23/2022     Assessment & Plan:   Problem List Items Addressed This Visit       Type 2 diabetes mellitus with stage 3a chronic kidney disease, without long-term current use of insulin    Jardiance was increased to 25 mg daily at her last appointment as her A1c increased from 6.7-7.6.  She states that she has not started this medication yet and just filled the prescription at the pharmacy yesterday. -No medication changes today.  I recommended starting Jardiance 25 mg daily as recently prescribed.      Mixed hyperlipidemia    Atorvastatin 40 mg daily was prescribed at her last appointment in the setting of poorly controlled HLD with a prior history of CVA.  She states that she has not started this medication yet and just filled the prescription yesterday. -Recommend starting atorvastatin 40 mg daily as recently prescribed.      Memory changes    She continues to express concern over deficits in her memory.  She states that she has had issues with her memory since childhood and is  interested in undergoing further workup for memory impairment. -Follow-up in 6 weeks for MoCA      Dry cough - Primary    Her acute concern today is a dry cough that has been present x 2 days.  Symptoms have not improved with over-the-counter cough/cold medications.  She is interested in treatment recommendations today. -Promethazine-DM prescribed for nightly use -Tessalon Perles added for cough relief during the day -She was instructed to return to care if her symptoms worsen or fail to improve.      Breast cancer screening by mammogram    Repeat screening mammogram ordered today       Return in about 6 weeks (around 03/01/2023) for [facility] & diabetic foot exam.    Billie Lade, MD

## 2023-01-18 NOTE — Assessment & Plan Note (Signed)
Her acute concern today is a dry cough that has been present x 2 days.  Symptoms have not improved with over-the-counter cough/cold medications.  She is interested in treatment recommendations today. -Promethazine-DM prescribed for nightly use -Tessalon Perles added for cough relief during the day -She was instructed to return to care if her symptoms worsen or fail to improve.

## 2023-01-18 NOTE — Assessment & Plan Note (Signed)
Atorvastatin 40 mg daily was prescribed at her last appointment in the setting of poorly controlled HLD with a prior history of CVA.  She states that she has not started this medication yet and just filled the prescription yesterday. -Recommend starting atorvastatin 40 mg daily as recently prescribed.

## 2023-01-18 NOTE — Assessment & Plan Note (Signed)
Jardiance was increased to 25 mg daily at her last appointment as her A1c increased from 6.7-7.6.  She states that she has not started this medication yet and just filled the prescription at the pharmacy yesterday. -No medication changes today.  I recommended starting Jardiance 25 mg daily as recently prescribed.

## 2023-01-18 NOTE — Patient Instructions (Signed)
It was a pleasure to see you today.  Thank you for giving [place] the opportunity to be involved in your care.  Below is a brief recap of your visit and next steps.  We will plan to see you again in 6 weeks.  Summary Add promethazine-DM and tessalon perles for cough relief Gabapentin refilled Follow up in 6 weeks for [facility]

## 2023-01-18 NOTE — Assessment & Plan Note (Addendum)
Repeat screening mammogram ordered today.  

## 2023-01-18 NOTE — Assessment & Plan Note (Signed)
She continues to express concern over deficits in her memory.  She states that she has had issues with her memory since childhood and is interested in undergoing further workup for memory impairment. -Follow-up in 6 weeks for [facility]

## 2023-01-19 ENCOUNTER — Other Ambulatory Visit: Payer: Self-pay | Admitting: Internal Medicine

## 2023-01-19 ENCOUNTER — Other Ambulatory Visit: Payer: Self-pay | Admitting: "Endocrinology

## 2023-01-19 DIAGNOSIS — I10 Essential (primary) hypertension: Secondary | ICD-10-CM

## 2023-01-19 DIAGNOSIS — E782 Mixed hyperlipidemia: Secondary | ICD-10-CM

## 2023-01-24 ENCOUNTER — Other Ambulatory Visit: Payer: Self-pay

## 2023-01-24 MED ORDER — ALENDRONATE SODIUM 70 MG PO TABS: ORAL_TABLET | ORAL | 0 refills | Status: DC

## 2023-02-16 DIAGNOSIS — M5416 Radiculopathy, lumbar region: Secondary | ICD-10-CM | POA: Diagnosis not present

## 2023-02-22 ENCOUNTER — Telehealth: Payer: Self-pay

## 2023-02-22 NOTE — Telephone Encounter (Signed)
Patient called with no answer. Message left to go by [facility] Radiology Department and have x-ray done before her appointment tomorrow.

## 2023-02-23 ENCOUNTER — Encounter: Payer: Self-pay | Admitting: Urology

## 2023-02-23 ENCOUNTER — Ambulatory Visit (HOSPITAL_COMMUNITY)
Admission: RE | Admit: 2023-02-23 | Discharge: 2023-02-23 | Disposition: A | Discharge status: Home / Self Care | Payer: Medicare Other | Source: Ambulatory Visit | Attending: Urology | Admitting: Urology

## 2023-02-23 ENCOUNTER — Ambulatory Visit: Payer: Medicare Other | Admitting: Urology

## 2023-02-23 VITALS — BP 117/73 | HR 88

## 2023-02-23 DIAGNOSIS — N200 Calculus of kidney: Secondary | ICD-10-CM | POA: Diagnosis not present

## 2023-02-23 NOTE — Progress Notes (Unsigned)
02/23/2023 12:20 PM   Teresa Soto [birth year] [id number]  Referring provider: Billie Lade, MD [address] [place],  [place] [address]  Chief Complaint  Patient presents with   Follow-up    STONE STUDY    HPI: Teresa Soto is a 66yo here for evaluation of nephrolithiasis. She had surgery in 2019 for a large left UPJ calculus. She then developed left flank pain in 11/2022. She underwent Ct stone study which showed a right UPJ obstruction/stricture and multiple small left renal calculi. She denies any flank pain currently. 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    PTSD (post-traumatic stress disorder)    Restless legs syndrome 2007 approx   Seasonal allergies    Shortness of breath dyspnea    coldness    Stroke [facility]) 2015    Surgical History: Past Surgical History:  Procedure Laterality Date   ANTERIOR LAT LUMBAR FUSION Right 12/26/2022   Procedure: Direct Lateral Interbody Fusion Lumbar three-four, Right  Prone  Transpsoas;  Surgeon: Bedelia Person, MD;  Location: [facility] OR;  Service: Neurosurgery;  Laterality: Right;   BACK SURGERY  2012   L1-L2  x2   CESAREAN SECTION     x 2   JOINT REPLACEMENT     LUMBAR PERCUTANEOUS PEDICLE SCREW 1 LEVEL Right 12/26/2022   Procedure: LUMBAR PERCUTANEOUS PEDICLE SCREW LUMBAR THREE-FOUR;  Surgeon: Bedelia Person, MD;   Location: [facility] OR;  Service: Neurosurgery;  Laterality: Right;   PARATHYROIDECTOMY N/A 06/27/2016   Procedure: MINIMALLY INVASIVE PARATHYROIDECTOMY;  Surgeon: Gaynelle Adu, MD;  Location: [facility] ORS;  Service: General;  Laterality: N/A;   PARATHYROIDECTOMY Right 02/09/2022   Procedure: PARATHYROIDECTOMY;  Surgeon: Kinsinger, De Blanch, MD;  Location: [facility] ORS;  Service: General;  Laterality: Right;   SHOULDER ARTHROSCOPY W/ ROTATOR CUFF REPAIR  2010   to right shoulder   SPINE SURGERY  12/2010   TONSILLECTOMY     TOTAL KNEE ARTHROPLASTY Right 08/12/2014   Procedure: RIGHT TOTAL KNEE ARTHROPLASTY;  Surgeon: Vickki Hearing, MD;  Location: [facility] ORS;  Service: Orthopedics;  Laterality: Right;   TUBAL LIGATION  1999    Home Medications:  Allergies as of 02/23/2023       Reactions   Fluticasone    headache   Influenza Vaccines Rash   Other Nausea And Vomiting   Peppers   Pneumococcal Vaccine Rash   Had rash, given both flu and PNA at same time    Pneumococcal Vaccines Rash   Had rash, given both flu and PNA at same time         Medication List        Accurate as of Feb 23, 2023 12:20 PM. If you have  any questions, ask your nurse or doctor.          albuterol 108 (90 Base) MCG/ACT inhaler Commonly known as: VENTOLIN HFA Inhale 2 puffs into the lungs every 6 (six) hours as needed for wheezing or shortness of breath.   alendronate 70 MG tablet Commonly known as: FOSAMAX Take 1 tablet by mouth every 7 days. Take with a full glass of water on an empty stomach.   aspirin EC 81 MG tablet Take 1 tablet (81 mg total) by mouth daily.   atorvastatin 40 MG tablet Commonly known as: LIPITOR Take 1 tablet (40 mg total) by mouth daily.   benzonatate 100 MG capsule Commonly known as: TESSALON Take 1 capsule (100 mg total) by mouth 2 (two) times daily as needed for cough.   Blood Glucose System Pak Kit Please dispense based on patient and insurance preference. Use as directed to  monitor FSBS 1x daily. Dx: E11.9.   Cholecalciferol 125 MCG (5000 UT) capsule Take 5,000 Units by mouth daily.   empagliflozin 25 MG Tabs tablet Commonly known as: Jardiance Take 1 tablet (25 mg total) by mouth daily before breakfast.   escitalopram 20 MG tablet Commonly known as: LEXAPRO Take 1 tablet (20 mg total) by mouth daily.   fenofibrate 145 MG tablet Commonly known as: TRICOR Take 1 tablet (145 mg total) by mouth daily.   ferrous sulfate 325 (65 FE) MG EC tablet Take 325 mg by mouth every morning.   gabapentin 300 MG capsule Commonly known as: NEURONTIN Take 1 capsule (300 mg total) by mouth 2 (two) times daily.   ketoconazole 2 % shampoo Commonly known as: Nizoral Apply 1 application topically 2 (two) times a week.   Lancet Devices Misc Please dispense based on patient and insurance preference. Use as directed to monitor FSBS 1x daily. Dx: E11.9.   Lancets Misc Please dispense based on patient and insurance preference. Use as directed to monitor FSBS 1x daily. Dx: E11.9.   lisinopril 10 MG tablet Commonly known as: ZESTRIL Take 1 tablet (10 mg total) by mouth daily.   meclizine 25 MG tablet Commonly known as: ANTIVERT Take 25 mg by mouth 3 (three) times daily as needed for dizziness.   montelukast 10 MG tablet Commonly known as: SINGULAIR Take 1 tablet (10 mg total) by mouth every evening.   ondansetron 4 MG tablet Commonly known as: ZOFRAN Take 1 tablet (4 mg total) by mouth every 8 (eight) hours as needed for vomiting or nausea.   polyethylene glycol powder 17 GM/SCOOP powder Commonly known as: GLYCOLAX/MIRALAX Take 17 g by mouth every other day.   potassium chloride SA 20 MEQ tablet Commonly known as: KLOR-CON M Take 1 tablet by mouth daily.   pramipexole 0.25 MG tablet Commonly known as: MIRAPEX Take 1 tablet (0.25 mg total) by mouth daily with breakfast.   promethazine-dextromethorphan 6.25-15 MG/5ML syrup Commonly known as:  PROMETHAZINE-DM Take 2.5 mLs by mouth 4 (four) times daily as needed for cough.        Allergies:  Allergies  Allergen Reactions   Fluticasone     headache   Influenza Vaccines Rash   Other Nausea And Vomiting    Peppers    Pneumococcal Vaccine Rash    Had rash, given both flu and PNA at same time    Pneumococcal Vaccines Rash    Had rash, given both flu and PNA at same time     Family History: Family History  Problem Relation Age of Onset  Hypertension Mother    Hyperlipidemia Mother    Alcohol abuse Mother    Arthritis Mother    Alcohol abuse Father    Dementia Paternal Grandmother    Alcohol abuse Maternal Grandfather    Hearing loss Maternal Grandfather    Heart disease Maternal Grandfather    Hypertension Maternal Grandfather    Depression Maternal Grandmother    ADD / ADHD Son    Learning disabilities Son    Alcohol abuse Sister    Arthritis Sister    Alcohol abuse Paternal Uncle    Diabetes Maternal Aunt    Obesity Maternal Aunt     Social History:  reports that she quit smoking about 40 years ago. Her smoking use included cigarettes. She has a 12.00 pack-year smoking history. She has been exposed to tobacco smoke. She has never used smokeless tobacco. She reports current alcohol use of about 2.0 standard drinks of alcohol per week. She reports that she does not use drugs.  ROS: All other review of systems were reviewed and are negative except what is noted above in HPI  Physical Exam: BP 117/73   Pulse 88   Constitutional:  Alert and oriented, No acute distress. HEENT: [place] AT, moist mucus membranes.  Trachea midline, no masses. Cardiovascular: No clubbing, cyanosis, or edema. Respiratory: Normal respiratory effort, no increased work of breathing. GI: Abdomen is soft, nontender, nondistended, no abdominal masses GU: No CVA tenderness.  Lymph: No cervical or inguinal lymphadenopathy. Skin: No rashes, bruises or suspicious lesions. Neurologic: Grossly  intact, no focal deficits, moving all 4 extremities. Psychiatric: Normal mood and affect.  Laboratory Data: Lab Results  Component Value Date   WBC 6.8 12/22/2022   HGB 14.7 12/22/2022   HCT 44.6 12/22/2022   MCV 86.3 12/22/2022   PLT 329 12/22/2022    Lab Results  Component Value Date   CREATININE 1.00 12/22/2022    No results found for: "PSA"  No results found for: "TESTOSTERONE"  Lab Results  Component Value Date   HGBA1C 7.6 (H) 11/23/2022    Urinalysis    Component Value Date/Time   COLORURINE YELLOW 11/28/2022 1245   APPEARANCEUR CLEAR 11/28/2022 1245   LABSPEC 1.015 11/28/2022 1245   PHURINE 6.0 11/28/2022 1245   GLUCOSEU >=500 (A) 11/28/2022 1245   HGBUR SMALL (A) 11/28/2022 1245   HGBUR negative 01/13/2008 1040   BILIRUBINUR NEGATIVE 11/28/2022 1245   KETONESUR NEGATIVE 11/28/2022 1245   PROTEINUR NEGATIVE 11/28/2022 1245   UROBILINOGEN 0.2 10/27/2013 1200   NITRITE NEGATIVE 11/28/2022 1245   LEUKOCYTESUR NEGATIVE 11/28/2022 1245    Lab Results  Component Value Date   LABMICR 9.3 11/23/2022   BACTERIA NONE SEEN 11/28/2022    Pertinent Imaging: KUB today: Images reviewed and discussed with the patient  Results for orders placed during the hospital encounter of 11/08/09  DG Abd 1 View  Narrative Clinical Data: 67 year old female with constipation and nausea.  ABDOMEN - 1 VIEW  Comparison: None.  Findings: Nonobstructed bowel gas pattern. Visualized abdominal visceral contours are within normal limits. No acute osseous abnormality identified.  IMPRESSION: Nonobstructed bowel gas pattern.  Provider: Pollyann Samples, Dessa Phi  No results found for this or any previous visit.  No results found for this or any previous visit.  No results found for this or any previous visit.  Results for orders placed during the hospital encounter of 11/02/21  US RENAL  Narrative CLINICAL DATA:  Chronic renal disease.  EXAM: RENAL / URINARY  TRACT ULTRASOUND  COMPLETE  COMPARISON:  None.  FINDINGS: Right Kidney:  Renal measurements: 11.2 x 5.6 x 5.6 cm = volume: 184.2 mL. Echogenicity within normal limits. No mass or hydronephrosis visualized.  Left Kidney:  Renal measurements: 9.2 x 5.4 x 5.1 cm = volume: 13.4 mL. Mild pelvicaliectasis. Two stones are seen in the left kidney measuring 6 mm and 5 mm. Two cysts are seen in the left kidney measuring 19 and 9 mm respectively.  Bladder:  Appears normal for degree of bladder distention.  Other:  None.  IMPRESSION: 1. Mild hydronephrosis on the left, age indeterminate. 2. 2 stones are seen in the left kidney measuring 6 and 5 mm. 3. Two cysts are seen in the left kidney measuring 19 and 9 mm. 4. The right kidney is unremarkable.   Electronically Signed By: Gerome Sam III M.D. On: 11/03/2021 18:54  No valid procedures specified. No results found for this or any previous visit.  Results for orders placed during the hospital encounter of 11/28/22  CT Renal Stone Study  Narrative CLINICAL DATA:  Left flank pain with nausea  EXAM: CT ABDOMEN AND PELVIS WITHOUT CONTRAST  TECHNIQUE: Multidetector CT imaging of the abdomen and pelvis was performed following the standard protocol without IV contrast.  RADIATION DOSE REDUCTION: This exam was performed according to the departmental dose-optimization program which includes automated exposure control, adjustment of the mA and/or kV according to patient size and/or use of iterative reconstruction technique.  COMPARISON:  CT 03/22/2018 images only.  No report  FINDINGS: Lower chest: Lung bases are clear.  No pleural effusion.  Hepatobiliary: Fatty liver infiltration identified with some areas of sparing along the gallbladder fossa margin. Gallbladder is nondilated.  Pancreas: Moderate atrophy of the pancreas without obvious mass.  Spleen: Normal in size without focal abnormality.  Adrenals/Urinary  Tract: Adrenal glands are preserved. There is moderate perinephric stranding on the left with multiple nonobstructing stones in the kidney. At least 4 are seen. Largest is along the lower pole measuring 5 mm. Moderate collecting system dilatation identified intrarenal. Less so of the ureter. No ureteral stone. Previously there was a large stone in the UPJ with a serially dilated collecting system. Today system could be patulous. Please correlate for recently passed stone. Preserved contours of the urinary bladder without luminal calcification.  There is a small hyperdense focus along the upper posterior right kidney which was not seen previously measuring 4 mm. There is similar focus towards the lower pole left exophytic lateral measuring 2.5 x 2.0 cm. These could be hyperdense hemorrhagic or proteinaceous cysts but were not seen previously are indeterminate.  Stomach/Bowel: Mild scattered colonic stool diffusely. Few left-sided sigmoid colon diverticula. No obstruction or dilatation. Normal appendix extends lateral from the cecum in the right lower quadrant. Stomach is nondilated. Small bowel is nondilated.  Vascular/Lymphatic: Normal caliber aorta and IVC with scattered vascular calcifications. No specific abnormal lymph node enlargement identified in the abdomen and pelvis.  Reproductive: Uterus has some calcifications consistent with presumed calcified fibroids. No separate adnexal mass  Other: No ascites.  Musculoskeletal: Scattered degenerative changes noted along the spine and pelvis. There is curvature of the spine. Hemilaminectomy changes identified L2. Trace anterolisthesis of L4 on L5. Multilevel stenosis with disc bulging particularly at L3-4.  IMPRESSION: 1. Multiple nonobstructing left renal stones. 2. Moderate left collecting system dilatation with perinephric stranding. No ureteral stone identified. Please correlate for recently passed stone. Based on the prior  outside study the dilatation also could represent a patulous  system. Please correlate with any known history. 3. Bilateral hyperdense renal lesions could be hyperdense hemorrhagic or proteinaceous cysts but were not seen previously. Consider follow-up contrast examination to exclude abnormal enhancement  Aortic Atherosclerosis (ICD10-I70.0).   Electronically Signed By: Elanah Kays M.D. On: 11/28/2022 12:47   Assessment & Plan:    1. Kidney stones -We discussed the management of kidney stones. These options include observation, ureteroscopy, shockwave lithotripsy (ESWL) and percutaneous nephrolithotomy (PCNL). We discussed which options are relevant to the patient's stone(s). We discussed the natural history of kidney stones as well as the complications of untreated stones and the impact on quality of life without treatment as well as with each of the above listed treatments. We also discussed the efficacy of each treatment in its ability to clear the stone burden. With any of these management options I discussed the signs and symptoms of infection and the need for emergent treatment should these be experienced. For each option we discussed the ability of each procedure to clear the patient of their stone burden.   For observation I described the risks which include but are not limited to silent renal damage, life-threatening infection, need for emergent surgery, failure to pass stone and pain.   For ureteroscopy I described the risks which include bleeding, infection, damage to contiguous structures, positioning injury, ureteral stricture, ureteral avulsion, ureteral injury, need for prolonged ureteral stent, inability to perform ureteroscopy, need for an interval procedure, inability to clear stone burden, stent discomfort/pain, heart attack, stroke, pulmonary embolus and the inherent risks with general anesthesia.   For shockwave lithotripsy I described the risks which include arrhythmia,  kidney contusion, kidney hemorrhage, need for transfusion, pain, inability to adequately break up stone, inability to pass stone fragments, Steinstrasse, infection associated with obstructing stones, need for alternate surgical procedure, need for repeat shockwave lithotripsy, MI, CVA, PE and the inherent risks with anesthesia/conscious sedation.   For PCNL I described the risks including positioning injury, pneumothorax, hydrothorax, need for chest tube, inability to clear stone burden, renal laceration, arterial venous fistula or malformation, need for embolization of kidney, loss of kidney or renal function, need for repeat procedure, need for prolonged nephrostomy tube, ureteral avulsion, MI, CVA, PE and the inherent risks of general anesthesia.   - The patient would like to proceed with observation. Followup 3 months with renal US - Urinalysis, Routine w reflex microscopic   No follow-ups on file.  Wilkie Aye, MD  [facility] [place]

## 2023-02-27 ENCOUNTER — Encounter: Payer: Self-pay | Admitting: Urology

## 2023-02-27 NOTE — Patient Instructions (Signed)

## 2023-03-01 ENCOUNTER — Encounter: Payer: Self-pay | Admitting: Internal Medicine

## 2023-03-01 ENCOUNTER — Ambulatory Visit (INDEPENDENT_AMBULATORY_CARE_PROVIDER_SITE_OTHER): Payer: Medicare Other | Admitting: Internal Medicine

## 2023-03-01 VITALS — BP 146/87 | HR 93 | Ht 62.0 in | Wt 163.4 lb

## 2023-03-01 DIAGNOSIS — G3184 Mild cognitive impairment, so stated: Secondary | ICD-10-CM | POA: Diagnosis not present

## 2023-03-01 DIAGNOSIS — R413 Other amnesia: Secondary | ICD-10-CM

## 2023-03-01 NOTE — Progress Notes (Signed)
Established Patient Office Visit  Subjective   Patient ID: Teresa Soto, female    DOB: 01-22-1956  Age: 67 y.o. MRN: [id number]  Chief Complaint  Patient presents with   Memory Loss    MOCA   Teresa Soto returns to care today for memory evaluation. Last seen by me on 4/11 at which time she expressed concern regarding memory deficits. These issues had been present since childhood but have recently worsened. 6 week follow up with arranged for memory evaluation. There have been no acute interval events. Teresa Soto has no additional concerns to discuss today. She describes 'poor memory', noting that she forgets things quicker than she used to. She has recently made errors while cooking and using recipes she has used many times before. She has recently left food in the microwave unintentionally. She also reports not being able to recall anything about books she has read previously. Teresa Soto is able to drive without getting lost. Her husband manages their finances. She is using more notes and writing things down to remember doctor's appointment and important tasks needing to be completed. Her family medical history is significant for unspecified dementia in her paternal grandmother. Teresa Soto reports hitting her head as a young child but cannot recall any further details.   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    PTSD (post-traumatic stress  disorder)    Restless legs syndrome 2007 approx   Seasonal allergies    Shortness of breath dyspnea    coldness    Stroke (HCC) 2015   Past Surgical History:  Procedure Laterality Date   ANTERIOR LAT LUMBAR FUSION Right 12/26/2022   Procedure: Direct Lateral Interbody Fusion Lumbar three-four, Right  Prone  Transpsoas;  Surgeon: Bedelia Person, MD;  Location: [facility];  Service: Neurosurgery;  Laterality: Right;   BACK SURGERY  2012   L1-L2  x2   CESAREAN SECTION     x 2   JOINT REPLACEMENT     LUMBAR PERCUTANEOUS PEDICLE SCREW 1 LEVEL Right 12/26/2022   Procedure: LUMBAR PERCUTANEOUS PEDICLE SCREW LUMBAR THREE-FOUR;  Surgeon: Bedelia Person, MD;  Location: [facility] OR;  Service: Neurosurgery;  Laterality: Right;   PARATHYROIDECTOMY N/A 06/27/2016   Procedure: MINIMALLY INVASIVE PARATHYROIDECTOMY;  Surgeon: Gaynelle Adu, MD;  Location: [facility] ORS;  Service: General;  Laterality: N/A;   PARATHYROIDECTOMY Right 02/09/2022   Procedure: PARATHYROIDECTOMY;  Surgeon: Kinsinger, De Blanch, MD;  Location: [facility] OR[facility];  Service: General;  Laterality: Right;   SHOULDER ARTHROSCOPY W/ ROTATOR CUFF REPAIR  2010   to right shoulder   SPINE SURGERY  12/2010   TONSILLECTOMY     TOTAL KNEE ARTHROPLASTY Right 08/12/2014   Procedure: RIGHT TOTAL KNEE ARTHROPLASTY;  Surgeon: Vickki Hearing, MD;  Location: [facility];  Service: Orthopedics;  Laterality:  Right;   TUBAL LIGATION  1999   Social History   Tobacco Use   Smoking status: Former    Packs/day: 2.00    Years: 6.00    Additional pack years: 0.00    Total pack years: 12.00    Types: Cigarettes    Quit date: 10/09/1982    Years since quitting: 40.4    Passive exposure: Past   Smokeless tobacco: Never   Tobacco comments:    quit in 1984  Vaping Use   Vaping Use: Former  Substance Use Topics   Alcohol use: Yes    Alcohol/week: 2.0 standard drinks of alcohol    Types: 1 Glasses of wine, 1 Cans of beer per week   Drug use: No   Family History   Problem Relation Age of Onset   Hypertension Mother    Hyperlipidemia Mother    Alcohol abuse Mother    Arthritis Mother    Alcohol abuse Father    Dementia Paternal Grandmother    Alcohol abuse Maternal Grandfather    Hearing loss Maternal Grandfather    Heart disease Maternal Grandfather    Hypertension Maternal Grandfather    Depression Maternal Grandmother    ADD / ADHD Son    Learning disabilities Son    Alcohol abuse Sister    Arthritis Sister    Alcohol abuse Paternal Uncle    Diabetes Maternal Aunt    Obesity Maternal Aunt    Allergies  Allergen Reactions   Fluticasone     headache   Influenza Vaccines Rash   Other Nausea And Vomiting    Peppers    Pneumococcal Vaccine Rash    Had rash, given both flu and PNA at same time    Pneumococcal Vaccines Rash    Had rash, given both flu and PNA at same time    Review of Systems  Constitutional:  Negative for chills and fever.  HENT:  Negative for sore throat.   Respiratory:  Negative for cough and shortness of breath.   Cardiovascular:  Negative for chest pain, palpitations and leg swelling.  Gastrointestinal:  Negative for abdominal pain, blood in stool, constipation, diarrhea, nausea and vomiting.  Genitourinary:  Negative for dysuria and hematuria.  Musculoskeletal:  Negative for myalgias.  Skin:  Negative for itching and rash.  Neurological:  Negative for dizziness and headaches.  Psychiatric/Behavioral:  Negative for depression and suicidal ideas.      Objective:     BP (!) 146/87   Pulse 93   Ht 5\' 2"  (1.575 m)   Wt 163 lb 6.4 oz (74.1 kg)   SpO2 96%   BMI 29.89 kg/m  BP Readings from Last 3 Encounters:  03/01/23 (!) 146/87  02/23/23 117/73  01/18/23 129/63   Physical Exam Vitals reviewed.  Constitutional:      General: She is not in acute distress.    Appearance: Normal appearance. She is obese. She is not toxic-appearing.  HENT:     Head: Normocephalic and atraumatic.     Right Ear:  External ear normal.     Left Ear: External ear normal.     Nose: Nose normal. No congestion or rhinorrhea.     Mouth/Throat:     Mouth: Mucous membranes are moist.     Pharynx: Oropharynx is clear. No oropharyngeal exudate or posterior oropharyngeal erythema.  Eyes:     General: No scleral icterus.    Extraocular Movements: Extraocular movements intact.     Conjunctiva/sclera: Conjunctivae normal.  Pupils: Pupils are equal, round, and reactive to light.  Cardiovascular:     Rate and Rhythm: Normal rate and regular rhythm.     Pulses: Normal pulses.     Heart sounds: Normal heart sounds. No murmur heard.    No friction rub. No gallop.  Pulmonary:     Effort: Pulmonary effort is normal.     Breath sounds: Normal breath sounds. No wheezing, rhonchi or rales.  Abdominal:     General: Abdomen is flat. Bowel sounds are normal. There is no distension.     Palpations: Abdomen is soft.     Tenderness: There is no abdominal tenderness.  Musculoskeletal:        General: No swelling. Normal range of motion.     Cervical back: Normal range of motion.     Right lower leg: No edema.     Left lower leg: No edema.  Lymphadenopathy:     Cervical: No cervical adenopathy.  Skin:    General: Skin is warm and dry.     Capillary Refill: Capillary refill takes less than 2 seconds.     Coloration: Skin is not jaundiced.  Neurological:     General: No focal deficit present.     Mental Status: She is alert and oriented to person, place, and time.  Psychiatric:        Mood and Affect: Mood normal.        Behavior: Behavior normal.    Diabetic foot exam was performed.  No deformities or other abnormal visual findings.  Posterior tibialis and dorsalis pulse intact bilaterally.  Intact to touch and monofilament testing bilaterally.   Last CBC Lab Results  Component Value Date   WBC 6.8 12/22/2022   HGB 14.7 12/22/2022   HCT 44.6 12/22/2022   MCV 86.3 12/22/2022   MCH 28.4 12/22/2022   RDW  13.5 12/22/2022   PLT 329 12/22/2022   Last metabolic panel Lab Results  Component Value Date   GLUCOSE 230 (H) 12/22/2022   NA 136 12/22/2022   K 3.7 12/22/2022   CL 102 12/22/2022   CO2 24 12/22/2022   BUN 23 12/22/2022   CREATININE 1.00 12/22/2022   GFRNONAA >60 12/22/2022   CALCIUM 10.0 12/22/2022   PHOS 3.6 03/29/2022   PROT 7.4 11/23/2022   ALBUMIN 4.8 11/23/2022   LABGLOB 2.6 11/23/2022   AGRATIO 1.8 11/23/2022   BILITOT 0.3 11/23/2022   ALKPHOS 40 (L) 11/23/2022   AST 14 11/23/2022   ALT 13 11/23/2022   ANIONGAP 10 12/22/2022   Last lipids Lab Results  Component Value Date   CHOL 203 (H) 11/23/2022   HDL 45 11/23/2022   LDLCALC 128 (H) 11/23/2022   TRIG 170 (H) 11/23/2022   CHOLHDL 4.5 (H) 11/23/2022   Last hemoglobin A1c Lab Results  Component Value Date   HGBA1C 7.6 (H) 11/23/2022   Last thyroid functions Lab Results  Component Value Date   TSH 0.809 11/23/2022   Last vitamin D Lab Results  Component Value Date   VD25OH 46.6 11/23/2022   Last vitamin B12 and Folate Lab Results  Component Value Date   VITAMINB12 862 11/23/2022   FOLATE 13.7 11/23/2022     Assessment & Plan:   Problem List Items Addressed This Visit       Memory changes    Presenting today for evaluation of memory changes.  She endorses a long-term history of memory impairment but states that her memory has worsened recently.  MoCA administered today.  Score 25/30.  Her most difficult task was delayed recall.  She otherwise performed well.  Based on her score, she would meet criteria for MCI, however based on her performance otherwise I do not know that she categorically aligns with mild cognitive impairment. -Through shared decision making, a neurology referral has been placed today for further neurocognitive evaluation. -We will tentatively plan for follow-up in 3 months      Return in about 3 months (around 06/01/2023).   Billie Lade, MD

## 2023-03-01 NOTE — Patient Instructions (Signed)
It was a pleasure to see you today.  Thank you for giving [place] the opportunity to be involved in your care.  Below is a brief recap of your visit and next steps.  We will plan to see you again in 3 months.  Summary Neurology referral placed today for further memory evaluation Follow up in 3 months

## 2023-03-07 NOTE — Assessment & Plan Note (Signed)
Presenting today for evaluation of memory changes.  She endorses a long-term history of memory impairment but states that her memory has worsened recently.  MoCA administered today.  Score 25/30.  Most difficult task was delayed recall.  She otherwise performed well.  Based on her score, she would meet criteria for MCI, however based on her performance otherwise I do not know that she categorically aligns with mild cognitive impairment. -Through shared decision making, and neurology referral has been placed today for further neurocognitive evaluation. -We will tentatively plan for follow-up in 3 months

## 2023-03-09 ENCOUNTER — Other Ambulatory Visit: Payer: Self-pay

## 2023-03-09 DIAGNOSIS — G3184 Mild cognitive impairment, so stated: Secondary | ICD-10-CM

## 2023-04-10 ENCOUNTER — Telehealth: Payer: Self-pay | Admitting: Internal Medicine

## 2023-04-10 NOTE — Telephone Encounter (Signed)
Patient wanting to know status of her referral to neurologist  Thank you,  Teresa Soto,  AMB Clinical Support [facility] AWV Program Direct Dial ??[phone]

## 2023-04-11 NOTE — Telephone Encounter (Signed)
Called pt and left VM

## 2023-04-22 ENCOUNTER — Other Ambulatory Visit: Payer: Self-pay | Admitting: Internal Medicine

## 2023-04-22 DIAGNOSIS — N1831 Chronic kidney disease, stage 3a: Secondary | ICD-10-CM

## 2023-04-22 DIAGNOSIS — E119 Type 2 diabetes mellitus without complications: Secondary | ICD-10-CM

## 2023-05-02 ENCOUNTER — Other Ambulatory Visit: Payer: Self-pay

## 2023-05-02 MED ORDER — ALENDRONATE SODIUM 70 MG PO TABS: ORAL_TABLET | ORAL | 0 refills | Status: DC

## 2023-05-09 ENCOUNTER — Telehealth (INDEPENDENT_AMBULATORY_CARE_PROVIDER_SITE_OTHER): Payer: Medicare Other

## 2023-05-09 VITALS — Ht 62.0 in | Wt 163.0 lb

## 2023-05-09 DIAGNOSIS — Z0001 Encounter for general adult medical examination with abnormal findings: Secondary | ICD-10-CM | POA: Diagnosis not present

## 2023-05-09 DIAGNOSIS — Z0000 Encounter for general adult medical examination without abnormal findings: Secondary | ICD-10-CM

## 2023-05-09 DIAGNOSIS — E119 Type 2 diabetes mellitus without complications: Secondary | ICD-10-CM

## 2023-05-09 DIAGNOSIS — Z1231 Encounter for screening mammogram for malignant neoplasm of breast: Secondary | ICD-10-CM

## 2023-05-09 NOTE — Patient Instructions (Signed)
Teresa Soto , Thank you for taking time to come for your Medicare Wellness Visit. I appreciate your ongoing commitment to your health goals. Please review the following plan we discussed and let me know if I can assist you in the future.   These are the goals we discussed:  Goals      Patient Stated     Work on losing weight        This is a list of the screening recommended for you and due dates:  Health Maintenance  Topic Date Due   Zoster (Shingles) Vaccine (1 of 2) Never done   Eye exam for diabetics  07/23/2021   COVID-19 Vaccine (3 - 2023-24 season) 06/09/2022   Mammogram  11/14/2022   DTaP/Tdap/Td vaccine (3 - Td or Tdap) 01/03/2023   Hemoglobin A1C  05/24/2023   Yearly kidney health urinalysis for diabetes  11/24/2023   Yearly kidney function blood test for diabetes  12/22/2023   Complete foot exam   02/29/2024   Medicare Annual Wellness Visit  05/08/2024   DEXA scan (bone density measurement)  11/08/2024   Cologuard (Stool DNA test)  01/18/2025   Hepatitis C Screening  Completed   HPV Vaccine  Aged Out   Pneumonia Vaccine  Discontinued    Advanced directives: Advance directive discussed with you today. Even though you declined this today, please call our office should you change your mind, and we can give you the proper paperwork for you to fill out. Advance care planning is a way to make decisions about medical care that fits your values in case you are ever unable to make these decisions for yourself.  Information on Advanced Care Planning can be found at [facility] Advance Health Care Directives Advance Health Care Directives ([email or web address]    Conditions/risks identified:  You have an order for:  []   2D Mammogram  [x]   3D Mammogram  []   Bone Density   []   Lung Cancer Screening  Please call for appointment:   [facility] [address] [place], [place] [address] [phone]  Make sure to wear  two-piece clothing.  No lotions powders or deodorants the day of the appointment Make sure to bring picture ID and insurance card.  Bring list of medications you are currently taking including any supplements.   Schedule your [place] screening mammogram through MyChart!   Log into your MyChart account.  Go to 'Visit' (or 'Appointments' if on mobile App) --> Schedule an Appointment  Under 'Select a Reason for Visit' choose the Mammogram Screening option.  Complete the pre-visit questions and select the time and place that best fits your schedule.  You have been referred to [facility] for a complete eye exam. If you haven't heard from them in a few days, please call them to schedule your appointment.  [facility] [address] [place] [address] Phone: [phone]   Next appointment: VIRTUAL/TELEPHONE APPOINTMENT Follow up in one year for your annual wellness visit  June 25, 2024 at 8:30am telephone visit.    Preventive Care 39 Years and Older, Female Preventive care refers to lifestyle choices and visits with your health care provider that can promote health and wellness. What does preventive care include? A yearly physical exam. This is also called an annual well check. Dental exams once or twice a year. Routine eye exams. Ask your health care provider how often you should have your  eyes checked. Personal lifestyle choices, including: Daily care of your teeth and gums. Regular physical activity. Eating a healthy diet. Avoiding tobacco and drug use. Limiting alcohol use. Practicing safe sex. Taking low-dose aspirin every day. Taking vitamin and mineral supplements as recommended by your health care provider. What happens during an annual well check? The services and screenings done by your health care provider during your annual well check will depend on your age, overall health, lifestyle risk factors, and family history of disease. Counseling   Your health care provider may ask you questions about your: Alcohol use. Tobacco use. Drug use. Emotional well-being. Home and relationship well-being. Sexual activity. Eating habits. History of falls. Memory and ability to understand (cognition). Work and work Astronomer. Reproductive health. Screening  You may have the following tests or measurements: Height, weight, and BMI. Blood pressure. Lipid and cholesterol levels. These may be checked every 5 years, or more frequently if you are over 23 years old. Skin check. Lung cancer screening. You may have this screening every year starting at age 10 if you have a 30-pack-year history of smoking and currently smoke or have quit within the past 15 years. Fecal occult blood test (FOBT) of the stool. You may have this test every year starting at age 48. Flexible sigmoidoscopy or colonoscopy. You may have a sigmoidoscopy every 5 years or a colonoscopy every 10 years starting at age 19. Hepatitis C blood test. Hepatitis B blood test. Sexually transmitted disease (STD) testing. Diabetes screening. This is done by checking your blood sugar (glucose) after you have not eaten for a while (fasting). You may have this done every 1-3 years. Bone density scan. This is done to screen for osteoporosis. You may have this done starting at age 55. Mammogram. This may be done every 1-2 years. Talk to your health care provider about how often you should have regular mammograms. Talk with your health care provider about your test results, treatment options, and if necessary, the need for more tests. Vaccines  Your health care provider may recommend certain vaccines, such as: Influenza vaccine. This is recommended every year. Tetanus, diphtheria, and acellular pertussis (Tdap, Td) vaccine. You may need a Td booster every 10 years. Zoster vaccine. You may need this after age 23. Pneumococcal 13-valent conjugate (PCV13) vaccine. One dose is recommended  after age 73. Pneumococcal polysaccharide (PPSV23) vaccine. One dose is recommended after age 67. Talk to your health care provider about which screenings and vaccines you need and how often you need them. This information is not intended to replace advice given to you by your health care provider. Make sure you discuss any questions you have with your health care provider. Document Released: 10/22/2015 Document Revised: 06/14/2016 Document Reviewed: 07/27/2015 Elsevier Interactive Patient Education  2017 [employer].  Fall Prevention in the Home Falls can cause injuries. They can happen to people of all ages. There are many things you can do to make your home safe and to help prevent falls. What can I do on the outside of my home? Regularly fix the edges of walkways and driveways and fix any cracks. Remove anything that might make you trip as you walk through a door, such as a raised step or threshold. Trim any bushes or trees on the path to your home. Use bright outdoor lighting. Clear any walking paths of anything that might make someone trip, such as rocks or tools. Regularly check to see if handrails are loose or broken. Make sure that both  sides of any steps have handrails. Any raised decks and porches should have guardrails on the edges. Have any leaves, snow, or ice cleared regularly. Use sand or salt on walking paths during winter. Clean up any spills in your garage right away. This includes oil or grease spills. What can I do in the bathroom? Use night lights. Install grab bars by the toilet and in the tub and shower. Do not use towel bars as grab bars. Use non-skid mats or decals in the tub or shower. If you need to sit down in the shower, use a plastic, non-slip stool. Keep the floor dry. Clean up any water that spills on the floor as soon as it happens. Remove soap buildup in the tub or shower regularly. Attach bath mats securely with double-sided non-slip rug tape. Do not  have throw rugs and other things on the floor that can make you trip. What can I do in the bedroom? Use night lights. Make sure that you have a light by your bed that is easy to reach. Do not use any sheets or blankets that are too big for your bed. They should not hang down onto the floor. Have a firm chair that has side arms. You can use this for support while you get dressed. Do not have throw rugs and other things on the floor that can make you trip. What can I do in the kitchen? Clean up any spills right away. Avoid walking on wet floors. Keep items that you use a lot in easy-to-reach places. If you need to reach something above you, use a strong step stool that has a grab bar. Keep electrical cords out of the way. Do not use floor polish or wax that makes floors slippery. If you must use wax, use non-skid floor wax. Do not have throw rugs and other things on the floor that can make you trip. What can I do with my stairs? Do not leave any items on the stairs. Make sure that there are handrails on both sides of the stairs and use them. Fix handrails that are broken or loose. Make sure that handrails are as long as the stairways. Check any carpeting to make sure that it is firmly attached to the stairs. Fix any carpet that is loose or worn. Avoid having throw rugs at the top or bottom of the stairs. If you do have throw rugs, attach them to the floor with carpet tape. Make sure that you have a light switch at the top of the stairs and the bottom of the stairs. If you do not have them, ask someone to add them for you. What else can I do to help prevent falls? Wear shoes that: Do not have high heels. Have rubber bottoms. Are comfortable and fit you well. Are closed at the toe. Do not wear sandals. If you use a stepladder: Make sure that it is fully opened. Do not climb a closed stepladder. Make sure that both sides of the stepladder are locked into place. Ask someone to hold it for  you, if possible. Clearly mark and make sure that you can see: Any grab bars or handrails. First and last steps. Where the edge of each step is. Use tools that help you move around (mobility aids) if they are needed. These include: Canes. Walkers. Scooters. Crutches. Turn on the lights when you go into a dark area. Replace any light bulbs as soon as they burn out. Set up your furniture so you  have a clear path. Avoid moving your furniture around. If any of your floors are uneven, fix them. If there are any pets around you, be aware of where they are. Review your medicines with your doctor. Some medicines can make you feel dizzy. This can increase your chance of falling. Ask your doctor what other things that you can do to help prevent falls. This information is not intended to replace advice given to you by your health care provider. Make sure you discuss any questions you have with your health care provider. Document Released: 07/22/2009 Document Revised: 03/02/2016 Document Reviewed: 10/30/2014 Elsevier Interactive Patient Education  2017 [employer].

## 2023-05-09 NOTE — Progress Notes (Signed)
 Because this visit was a virtual/telehealth visit,  certain criteria was not obtained, such a blood pressure, CBG if patient is a diabetic, and timed up and go. Any medications not marked as "taking" was not mentioned during the medication reconciliation part of the visit. Any vitals not documented were not able to be obtained due to this being a telehealth visit. Vitals documented are verbally provided by the patient.  Per patient no change in vitals since last visit, unable to obtain new vitals due to telehealth visit.   Subjective:   Teresa Soto is a 67 y.o. female who presents for an Initial Medicare Annual Wellness Visit.  Visit Complete: Virtual  I connected with  Graciela Tichy Santilli on 05/09/23 by a video and audio enabled telemedicine application and verified that I am speaking with the correct person using two identifiers.  Patient Location: Home  Provider Location: Home Office  I discussed the limitations of evaluation and management by telemedicine. The patient expressed understanding and agreed to proceed.  Patient Medicare AWV questionnaire was completed by the patient on n/a; I have confirmed that all information answered by patient is correct and no changes since this date.  Review of Systems     Cardiac Risk Factors include: advanced age (>6men, >30 women);diabetes mellitus;dyslipidemia;hypertension;sedentary lifestyle     Objective:    Today's Vitals   05/09/23 1031  Weight: 163 lb (73.9 kg)  Height: 5\' 2"  (1.575 m)   Body mass index is 29.81 kg/m.     05/09/2023   10:30 AM 12/26/2022    5:55 AM 12/22/2022    9:27 AM 11/28/2022   11:05 AM 06/21/2022    2:33 PM 02/09/2022   12:29 PM 02/09/2022   12:00 PM  Advanced Directives  Does Patient Have a Medical Advance Directive? No No No No No  No  Would patient like information on creating a medical advance directive? No - Patient declined  No - Patient declined No - Patient declined No - Patient declined No - Patient  declined     Current Medications (verified) Outpatient Encounter Medications as of 05/09/2023  Medication Sig   albuterol (VENTOLIN HFA) 108 (90 Base) MCG/ACT inhaler Inhale 2 puffs into the lungs every 6 (six) hours as needed for wheezing or shortness of breath.   alendronate (FOSAMAX) 70 MG tablet Take 1 tablet by mouth every 7 days. Take with a full glass of water on an empty stomach.   aspirin EC 81 MG tablet Take 1 tablet (81 mg total) by mouth daily.   atorvastatin (LIPITOR) 40 MG tablet Take 1 tablet (40 mg total) by mouth daily.   benzonatate (TESSALON) 100 MG capsule Take 1 capsule (100 mg total) by mouth 2 (two) times daily as needed for cough.   Blood Glucose Monitoring Suppl (BLOOD GLUCOSE SYSTEM PAK) KIT Please dispense based on patient and insurance preference. Use as directed to monitor FSBS 1x daily. Dx: E11.9.   Cholecalciferol 125 MCG (5000 UT) capsule Take 5,000 Units by mouth daily.   escitalopram (LEXAPRO) 20 MG tablet Take 1 tablet (20 mg total) by mouth daily.   fenofibrate (TRICOR) 145 MG tablet Take 1 tablet (145 mg total) by mouth daily.   ferrous sulfate 325 (65 FE) MG EC tablet Take 325 mg by mouth every morning.   gabapentin (NEURONTIN) 300 MG capsule Take 1 capsule (300 mg total) by mouth 2 (two) times daily.   JARDIANCE 25 MG TABS tablet TAKE 1 TABLET BY MOUTH ONCE DAILY BEFORE  BREAKFAST   ketoconazole (NIZORAL) 2 % shampoo Apply 1 application topically 2 (two) times a week.   Lancet Devices MISC Please dispense based on patient and insurance preference. Use as directed to monitor FSBS 1x daily. Dx: E11.9.   Lancets MISC Please dispense based on patient and insurance preference. Use as directed to monitor FSBS 1x daily. Dx: E11.9.   lisinopril (ZESTRIL) 10 MG tablet Take 1 tablet (10 mg total) by mouth daily.   meclizine (ANTIVERT) 25 MG tablet Take 25 mg by mouth 3 (three) times daily as needed for dizziness.   montelukast (SINGULAIR) 10 MG tablet Take 1 tablet  (10 mg total) by mouth every evening.   ondansetron (ZOFRAN) 4 MG tablet Take 1 tablet (4 mg total) by mouth every 8 (eight) hours as needed for vomiting or nausea.   polyethylene glycol powder (GLYCOLAX/MIRALAX) 17 GM/SCOOP powder Take 17 g by mouth every other day.   potassium chloride SA (KLOR-CON M) 20 MEQ tablet Take 1 tablet by mouth daily.   pramipexole (MIRAPEX) 0.25 MG tablet Take 1 tablet (0.25 mg total) by mouth daily with breakfast.   promethazine-dextromethorphan (PROMETHAZINE-DM) 6.25-15 MG/5ML syrup Take 2.5 mLs by mouth 4 (four) times daily as needed for cough.   No facility-administered encounter medications on file as of 05/09/2023.    Allergies (verified) Fluticasone, Influenza vaccines, Other, Pneumococcal vaccine, and Pneumococcal vaccines   History: 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    PTSD (post-traumatic stress disorder)    Restless legs syndrome 2007 approx   Seasonal allergies    Shortness of breath dyspnea    coldness    Stroke (HCC) 2015   Past Surgical History:  Procedure Laterality Date   ANTERIOR LAT LUMBAR FUSION Right 12/26/2022   Procedure: Direct Lateral Interbody Fusion Lumbar three-four, Right  Prone  Transpsoas;  Surgeon: Bedelia Person, MD;  Location: [facility] OR;  Service: Neurosurgery;  Laterality: Right;   BACK SURGERY  2012   L1-L2  x2   CESAREAN SECTION     x 2   JOINT REPLACEMENT     LUMBAR  PERCUTANEOUS PEDICLE SCREW 1 LEVEL Right 12/26/2022   Procedure: LUMBAR PERCUTANEOUS PEDICLE SCREW LUMBAR THREE-FOUR;  Surgeon: Bedelia Person, MD;  Location: [facility] OR;  Service: Neurosurgery;  Laterality: Right;   PARATHYROIDECTOMY N/A 06/27/2016   Procedure: MINIMALLY INVASIVE PARATHYROIDECTOMY;  Surgeon: Gaynelle Adu, MD;  Location: [facility] ORS;  Service: General;  Laterality: N/A;   PARATHYROIDECTOMY Right 02/09/2022   Procedure: PARATHYROIDECTOMY;  Surgeon: Kinsinger, De Blanch, MD;  Location: [facility] ORS;  Service: General;  Laterality: Right;   SHOULDER ARTHROSCOPY W/ ROTATOR CUFF REPAIR  2010   to right shoulder   SPINE SURGERY  12/2010   TONSILLECTOMY     TOTAL KNEE ARTHROPLASTY Right 08/12/2014   Procedure: RIGHT  TOTAL KNEE ARTHROPLASTY;  Surgeon: Vickki Hearing, MD;  Location: [facility] ORS;  Service: Orthopedics;  Laterality: Right;   TUBAL LIGATION  1999   Family History  Problem Relation Age of Onset   Hypertension Mother    Hyperlipidemia Mother    Alcohol abuse Mother    Arthritis Mother    Alcohol abuse Father    Dementia Paternal Grandmother    Alcohol abuse Maternal Grandfather    Hearing loss Maternal Grandfather    Heart disease Maternal Grandfather    Hypertension Maternal Grandfather    Depression Maternal Grandmother    ADD / ADHD Son    Learning disabilities Son    Alcohol abuse Sister    Arthritis Sister    Alcohol abuse Paternal Uncle    Diabetes Maternal Aunt    Obesity Maternal Aunt    Social History   Socioeconomic History   Marital status: Married    Spouse name: Cecilia Morley   Number of children: Not on file   Years of education: 12   Highest education level: 12th grade  Occupational History   Not on file  Tobacco Use   Smoking status: Former    Current packs/day: 0.00    Average packs/day: 2.0 packs/day for 6.0 years (12.0 ttl pk-yrs)    Types: Cigarettes    Start date: 10/09/1976    Quit date: 10/09/1982    Years since quitting: 40.6     Passive exposure: Past   Smokeless tobacco: Never   Tobacco comments:    quit in 1984  Vaping Use   Vaping status: Former  Substance and Sexual Activity   Alcohol use: Yes    Alcohol/week: 2.0 standard drinks of alcohol    Types: 1 Glasses of wine, 1 Cans of beer per week   Drug use: No   Sexual activity: Yes    Partners: Male    Birth control/protection: Post-menopausal, Surgical  Other Topics Concern   Not on file  Social History Narrative   Not on file   Social Determinants of Health   Financial Resource Strain: Low Risk  (05/09/2023)   Overall Financial Resource Strain (CARDIA)    Difficulty of Paying Living Expenses: Not hard at all  Food Insecurity: No Food Insecurity (05/09/2023)   Hunger Vital Sign    Worried About Running Out of Food in the Last Year: Never true    Ran Out of Food in the Last Year: Never true  Transportation Needs: No Transportation Needs (05/09/2023)   PRAPARE - Administrator, Civil Service (Medical): No    Lack of Transportation (Non-Medical): No  Physical Activity: Insufficiently Active (05/09/2023)   Exercise Vital Sign    Days of Exercise per Week: 7 days    Minutes of Exercise per Session: 20 min  Stress: No Stress Concern Present (05/09/2023)   [employer] of Occupational Health - Occupational Stress Questionnaire    Feeling of Stress : Not at all  Social Connections: Socially Integrated (05/09/2023)   Social Connection and Isolation Panel [NHANES]    Frequency of Communication with Friends and Family: More than three times a week    Frequency of Social Gatherings with Friends and Family: More than three times a week    Attends Religious Services: More than 4 times per year    Active Member of [employer] or Organizations: Yes    Attends Engineer, structural: More than 4 times per year    Marital Status: Married  Tobacco Counseling Counseling given: Not Answered Tobacco comments: quit in 1984   Clinical  Intake:  Pre-visit preparation completed: Yes  Pain : No/denies pain     BMI - recorded: 29.81 Nutritional Status: BMI 25 -29 Overweight Nutritional Risks: None Diabetes: Yes CBG done?: No (telehealth visit. unable to obtain CBG) Did pt. bring in CBG monitor from home?: No (telehealth visit)  How often do you need to have someone help you when you read instructions, pamphlets, or other written materials from your doctor or pharmacy?: 1 - Never  Interpreter Needed?: No  Information entered by ::  Deshana Rominger, CMA   Activities of Daily Living    05/09/2023   10:48 AM 12/22/2022    9:32 AM  In your present state of health, do you have any difficulty performing the following activities:  Hearing? 0   Vision? 0   Difficulty concentrating or making decisions? 0   Walking or climbing stairs? 0   Dressing or bathing? 0   Doing errands, shopping? 0 0  Preparing Food and eating ? N   Using the Toilet? N   In the past six months, have you accidently leaked urine? N   Do you have problems with loss of bowel control? N   Managing your Medications? N   Managing your Finances? N   Housekeeping or managing your Housekeeping? N     Patient Care Team: Billie Lade, MD as PCP - General (Internal Medicine) Michaelle Copas, MD as Referring Physician (Optometry)  Indicate any recent Medical Services you may have received from other than [facility] providers in the past year (date may be approximate).     Assessment:   This is a routine wellness examination for Shatina.  Hearing/Vision screen Hearing Screening - Comments:: Patient denies any hearing difficulties.    Dietary issues and exercise activities discussed:     Goals Addressed             This Visit's Progress    Patient Stated       Work on losing weight       Depression Screen    05/09/2023   10:45 AM 03/01/2023    9:58 AM 01/18/2023   10:53 AM 12/21/2022   10:41 AM 11/23/2022   10:20 AM 06/21/2022    2:28 PM  01/09/2022   10:55 AM  PHQ 2/9 Scores  PHQ - 2 Score 0 0 0 0 0 0 1  PHQ- 9 Score  2 2 6 3  6     Fall Risk    05/09/2023   10:48 AM 03/01/2023    9:58 AM 01/18/2023   10:53 AM 12/21/2022   10:41 AM 11/23/2022   10:20 AM  Fall Risk   Falls in the past year? 0 0 0 0 0  Number falls in past yr: 0 0 0 0 0  Injury with Fall? 0 0 0 0 0  Risk for fall due to : No Fall Risks No Fall Risks  No Fall Risks No Fall Risks  Follow up Falls prevention discussed Falls evaluation completed  Falls evaluation completed Falls evaluation completed    MEDICARE RISK AT HOME:  Medicare Risk at Home - 05/09/23 1046     Any stairs in or around the home? No    If so, are there any without handrails? No    Home free of loose throw rugs in walkways, pet beds, electrical cords, etc? Yes    Adequate lighting in your home to  reduce risk of falls? Yes    Life alert? No    Use of a cane, walker or w/c? Yes    Grab bars in the bathroom? No    Shower chair or bench in shower? No    Elevated toilet seat or a handicapped toilet? No             TIMED UP AND GO:  Was the test performed? No    Cognitive Function:        05/09/2023   10:38 AM  6CIT Screen  What Year? 0 points  What month? 0 points  What time? 0 points  Count back from 20 0 points  Months in reverse 0 points  Repeat phrase 0 points  Total Score 0 points    Immunizations Immunization History  Administered Date(s) Administered   Influenza Whole 06/28/2010   PFIZER(Purple Top)SARS-COV-2 Vaccination 03/09/2020, 04/08/2020   Td 03/30/2010   Tdap 01/02/2013    TDAP status: Due, Education has been provided regarding the importance of this vaccine. Advised may receive this vaccine at local pharmacy or Health Dept. Aware to provide a copy of the vaccination record if obtained from local pharmacy or Health Dept. Verbalized acceptance and understanding.  Flu Vaccine status: Up to date  Pneumococcal vaccine status: Up to date  Covid-19  vaccine status: Information provided on how to obtain vaccines.   Qualifies for Shingles Vaccine? Yes   Zostavax completed No   Shingrix Completed?: No.    Education has been provided regarding the importance of this vaccine. Patient has been advised to call insurance company to determine out of pocket expense if they have not yet received this vaccine. Advised may also receive vaccine at local pharmacy or Health Dept. Verbalized acceptance and understanding.  Screening Tests Health Maintenance  Topic Date Due   Medicare Annual Wellness (AWV)  Never done   Zoster Vaccines- Shingrix (1 of 2) Never done   OPHTHALMOLOGY EXAM  07/23/2021   COVID-19 Vaccine (3 - 2023-24 season) 06/09/2022   MAMMOGRAM  11/14/2022   DTaP/Tdap/Td (3 - Td or Tdap) 01/03/2023   HEMOGLOBIN A1C  05/24/2023   Diabetic kidney evaluation - Urine ACR  11/24/2023   Diabetic kidney evaluation - eGFR measurement  12/22/2023   FOOT EXAM  02/29/2024   DEXA SCAN  11/08/2024   Fecal DNA (Cologuard)  01/18/2025   Hepatitis C Screening  Completed   HPV VACCINES  Aged Out   Pneumonia Vaccine 7+ Years old  Discontinued    Health Maintenance  Health Maintenance Due  Topic Date Due   Medicare Annual Wellness (AWV)  Never done   Zoster Vaccines- Shingrix (1 of 2) Never done   OPHTHALMOLOGY EXAM  07/23/2021   COVID-19 Vaccine (3 - 2023-24 season) 06/09/2022   MAMMOGRAM  11/14/2022   DTaP/Tdap/Td (3 - Td or Tdap) 01/03/2023    Colorectal cancer screening: Type of screening: Colonoscopy. Completed 01/18/2022. Repeat every 3 years  Mammogram status: Completed 11/14/2021. Repeat every year  Bone Density status: Completed 11/08/2022. Results reflect: Bone density results: OSTEOPENIA. Repeat every 2 years.  Lung Cancer Screening: (Low Dose CT Chest recommended if Age 28-80 years, 20 pack-year currently smoking OR have quit w/in 15years.) does not qualify.   Lung Cancer Screening Referral: n/a  Additional  Screening:  Hepatitis C Screening: does not qualify; Completed 11/22/2016  Vision Screening: Recommended annual ophthalmology exams for early detection of glaucoma and other disorders of the eye. Is the patient up to date with their  annual eye exam?  Yes  Who is the provider or what is the name of the office in which the patient attends annual eye exams? [facility]  If pt is not established with a provider, would they like to be referred to a provider to establish care? Yes .   Dental Screening: Recommended annual dental exams for proper oral hygiene  Diabetic Foot Exam: Diabetic Foot Exam: Completed 03/01/2023  Community Resource Referral / Chronic Care Management: CRR required this visit?  No   CCM required this visit?  No     Plan:     I have personally reviewed and noted the following in the patient's chart:   Medical and social history Use of alcohol, tobacco or illicit drugs  Current medications and supplements including opioid prescriptions. Patient is not currently taking opioid prescriptions. Functional ability and status Nutritional status Physical activity Advanced directives List of other physicians Hospitalizations, surgeries, and ER visits in previous 12 months Vitals Screenings to include cognitive, depression, and falls Referrals and appointments  In addition, I have reviewed and discussed with patient certain preventive protocols, quality metrics, and best practice recommendations. A written personalized care plan for preventive services as well as general preventive health recommendations were provided to patient.     Jordan Hawks Arnel Wymer, CMA   05/09/2023   After Visit Summary: (MyChart) Due to this being a telephonic visit, the after visit summary with patients personalized plan was offered to patient via MyChart   Nurse Notes:

## 2023-05-16 DIAGNOSIS — R0683 Snoring: Secondary | ICD-10-CM | POA: Diagnosis not present

## 2023-05-16 DIAGNOSIS — R413 Other amnesia: Secondary | ICD-10-CM | POA: Diagnosis not present

## 2023-05-20 ENCOUNTER — Other Ambulatory Visit: Payer: Self-pay | Admitting: Internal Medicine

## 2023-05-20 DIAGNOSIS — I10 Essential (primary) hypertension: Secondary | ICD-10-CM

## 2023-05-20 DIAGNOSIS — E782 Mixed hyperlipidemia: Secondary | ICD-10-CM

## 2023-05-21 DIAGNOSIS — M5416 Radiculopathy, lumbar region: Secondary | ICD-10-CM | POA: Diagnosis not present

## 2023-05-21 DIAGNOSIS — Z6831 Body mass index (BMI) 31.0-31.9, adult: Secondary | ICD-10-CM | POA: Diagnosis not present

## 2023-05-30 ENCOUNTER — Ambulatory Visit: Payer: Medicare Other | Admitting: Internal Medicine

## 2023-06-01 ENCOUNTER — Encounter: Payer: Self-pay | Admitting: Internal Medicine

## 2023-06-08 ENCOUNTER — Ambulatory Visit: Payer: Medicare Other | Admitting: Urology

## 2023-07-10 ENCOUNTER — Telehealth: Payer: Self-pay

## 2023-07-10 DIAGNOSIS — E119 Type 2 diabetes mellitus without complications: Secondary | ICD-10-CM | POA: Diagnosis not present

## 2023-07-10 NOTE — Telephone Encounter (Signed)
Patient came in office to set up appointment with Dr. Romeo Apple. She fell down some steps a few weeks back and would like to have Dr. Romeo Apple check her out. She had Lumbar fusion done 12/26/22 by Dr. Hoyt Koch. I explained to her that she should return to him first and be checked out. She informed me that at her last visit with him he did x-rays and told her that everything looks good. She doesn't hurt in her back but in her left buttock and the pain radiates down her left leg. She stated that if Dr. Romeo Apple will see her and tell her that she needs to go back to her surgeon she is fine with that, but she doesn't think that is where she should start.  Please advise

## 2023-07-19 ENCOUNTER — Encounter: Payer: Self-pay | Admitting: Orthopedic Surgery

## 2023-07-19 ENCOUNTER — Other Ambulatory Visit (INDEPENDENT_AMBULATORY_CARE_PROVIDER_SITE_OTHER): Payer: Self-pay

## 2023-07-19 ENCOUNTER — Ambulatory Visit: Payer: Medicare Other | Admitting: Orthopedic Surgery

## 2023-07-19 VITALS — BP 167/92 | HR 80 | Ht 62.0 in | Wt 167.0 lb

## 2023-07-19 DIAGNOSIS — M25552 Pain in left hip: Secondary | ICD-10-CM

## 2023-07-19 DIAGNOSIS — M79605 Pain in left leg: Secondary | ICD-10-CM

## 2023-07-19 NOTE — Progress Notes (Signed)
Office Visit Note   Patient: Teresa Soto           Date of Birth: 09-May-1956           MRN: [id number] Visit Date: 07/19/2023 Requested by: Billie Lade, MD [address] [place],  [place] [address] PCP: Billie Lade, MD  Chief Complaint  Patient presents with   Leg Pain    Entire left leg painful surgery lumbar fusion by Dr Maisie Fus in March this year, states was pain free now has left leg pain after falling down stairs, pain started 3 weeks after fall     History 67 year old female completely pain-free after lumbar fusion/L landing on her left buttock area now complains of pain in the left buttock.  Occasional into the left foot  Symptoms came on about 3 weeks after the fall  She is here for evaluation.  She is ambulating pain since the fall.  She has tenderness in the left buttock and lower back.  Straight leg raise is negative.  Sensation is normal in the left leg and there is no deficit on the motor exam  Imaging left hip shows no abnormalities in the left hip  Impression probable contusion sciatic nerve  Recommend follow-up with neurosurgery as there is no orthopedic indication for any hip issue.

## 2023-07-23 ENCOUNTER — Other Ambulatory Visit: Payer: Self-pay | Admitting: Internal Medicine

## 2023-07-23 DIAGNOSIS — M5416 Radiculopathy, lumbar region: Secondary | ICD-10-CM | POA: Diagnosis not present

## 2023-07-25 ENCOUNTER — Other Ambulatory Visit (HOSPITAL_COMMUNITY): Payer: Self-pay | Admitting: Neurosurgery

## 2023-07-25 ENCOUNTER — Other Ambulatory Visit: Payer: Self-pay | Admitting: Internal Medicine

## 2023-07-25 DIAGNOSIS — M5416 Radiculopathy, lumbar region: Secondary | ICD-10-CM

## 2023-07-30 ENCOUNTER — Other Ambulatory Visit: Payer: Self-pay | Admitting: Internal Medicine

## 2023-07-30 DIAGNOSIS — F339 Major depressive disorder, recurrent, unspecified: Secondary | ICD-10-CM

## 2023-08-02 ENCOUNTER — Ambulatory Visit (HOSPITAL_COMMUNITY)
Admission: RE | Admit: 2023-08-02 | Discharge: 2023-08-02 | Disposition: A | Discharge status: Home / Self Care | Payer: Medicare Other | Source: Ambulatory Visit | Attending: Neurosurgery | Admitting: Neurosurgery

## 2023-08-02 DIAGNOSIS — M48061 Spinal stenosis, lumbar region without neurogenic claudication: Secondary | ICD-10-CM | POA: Diagnosis not present

## 2023-08-02 DIAGNOSIS — M5416 Radiculopathy, lumbar region: Secondary | ICD-10-CM | POA: Insufficient documentation

## 2023-08-02 DIAGNOSIS — M79605 Pain in left leg: Secondary | ICD-10-CM | POA: Diagnosis not present

## 2023-08-02 DIAGNOSIS — M5126 Other intervertebral disc displacement, lumbar region: Secondary | ICD-10-CM | POA: Diagnosis not present

## 2023-08-02 DIAGNOSIS — M4316 Spondylolisthesis, lumbar region: Secondary | ICD-10-CM | POA: Diagnosis not present

## 2023-08-10 DIAGNOSIS — E119 Type 2 diabetes mellitus without complications: Secondary | ICD-10-CM | POA: Diagnosis not present

## 2023-08-13 ENCOUNTER — Other Ambulatory Visit: Payer: Self-pay | Admitting: Internal Medicine

## 2023-08-13 DIAGNOSIS — E119 Type 2 diabetes mellitus without complications: Secondary | ICD-10-CM

## 2023-08-13 DIAGNOSIS — N1831 Chronic kidney disease, stage 3a: Secondary | ICD-10-CM

## 2023-08-17 DIAGNOSIS — M5416 Radiculopathy, lumbar region: Secondary | ICD-10-CM | POA: Diagnosis not present

## 2023-08-17 DIAGNOSIS — Z6831 Body mass index (BMI) 31.0-31.9, adult: Secondary | ICD-10-CM | POA: Diagnosis not present

## 2023-08-29 ENCOUNTER — Other Ambulatory Visit: Payer: Self-pay | Admitting: Internal Medicine

## 2023-08-29 DIAGNOSIS — I10 Essential (primary) hypertension: Secondary | ICD-10-CM

## 2023-08-29 DIAGNOSIS — E782 Mixed hyperlipidemia: Secondary | ICD-10-CM

## 2023-09-09 DIAGNOSIS — E119 Type 2 diabetes mellitus without complications: Secondary | ICD-10-CM | POA: Diagnosis not present

## 2023-09-10 DIAGNOSIS — M5416 Radiculopathy, lumbar region: Secondary | ICD-10-CM | POA: Diagnosis not present

## 2023-10-04 ENCOUNTER — Other Ambulatory Visit: Payer: Self-pay | Admitting: Internal Medicine

## 2023-10-04 DIAGNOSIS — G8929 Other chronic pain: Secondary | ICD-10-CM

## 2023-10-10 DIAGNOSIS — E119 Type 2 diabetes mellitus without complications: Secondary | ICD-10-CM | POA: Diagnosis not present

## 2023-10-19 DIAGNOSIS — M5416 Radiculopathy, lumbar region: Secondary | ICD-10-CM | POA: Diagnosis not present

## 2023-10-19 DIAGNOSIS — Z6831 Body mass index (BMI) 31.0-31.9, adult: Secondary | ICD-10-CM | POA: Diagnosis not present

## 2023-10-20 ENCOUNTER — Other Ambulatory Visit: Payer: Self-pay | Admitting: Internal Medicine

## 2023-10-20 DIAGNOSIS — E119 Type 2 diabetes mellitus without complications: Secondary | ICD-10-CM

## 2023-10-20 DIAGNOSIS — F339 Major depressive disorder, recurrent, unspecified: Secondary | ICD-10-CM

## 2023-10-20 DIAGNOSIS — N1831 Chronic kidney disease, stage 3a: Secondary | ICD-10-CM

## 2023-10-20 DIAGNOSIS — E1122 Type 2 diabetes mellitus with diabetic chronic kidney disease: Secondary | ICD-10-CM

## 2023-10-30 ENCOUNTER — Ambulatory Visit (HOSPITAL_COMMUNITY): Payer: Medicare Other | Admitting: Physical Therapy

## 2023-11-06 ENCOUNTER — Ambulatory Visit (HOSPITAL_COMMUNITY): Payer: Medicare Other | Attending: Internal Medicine

## 2023-11-06 ENCOUNTER — Other Ambulatory Visit: Payer: Self-pay

## 2023-11-06 DIAGNOSIS — M5459 Other low back pain: Secondary | ICD-10-CM | POA: Insufficient documentation

## 2023-11-06 DIAGNOSIS — R262 Difficulty in walking, not elsewhere classified: Secondary | ICD-10-CM | POA: Diagnosis not present

## 2023-11-06 NOTE — Therapy (Signed)
[facility] OUTPATIENT PHYSICAL THERAPY EVALUATION (LOWER EXTREMITY)   Patient Name: Teresa Soto MRN: [id number] DOB:[birth year], 68 y.o., female Today's Date: 11/06/2023  END OF SESSION:   PT End of Session - 11/06/23 1156     Visit Number 1    Number of Visits 6    Date for PT Re-Evaluation 12/18/23    Authorization Type BCBS Medicare    Authorization Time Period Chestine Spore 11/06/23    PT Start Time 1145    PT Stop Time 1225    PT Time Calculation (min) 40 min    Activity Tolerance Patient tolerated treatment well    Behavior During Therapy [facility] for tasks assessed/performed             12/18/2023   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    PTSD (post-traumatic stress disorder)    Restless legs syndrome 2007 approx   Seasonal allergies    Shortness of breath dyspnea    coldness    Stroke (HCC) 2015   Past Surgical History:  Procedure Laterality Date   ANTERIOR LAT LUMBAR FUSION Right 12/26/2022   Procedure: Direct Lateral Interbody Fusion Lumbar three-four, Right  Prone  Transpsoas;  Surgeon: Bedelia Person, MD;  Location: [facility] OR;  Service: Neurosurgery;  Laterality: Right;   BACK SURGERY  2012   L1-L2  x2   CESAREAN SECTION     x 2   JOINT REPLACEMENT     LUMBAR PERCUTANEOUS PEDICLE SCREW 1 LEVEL Right 12/26/2022   Procedure: LUMBAR PERCUTANEOUS PEDICLE SCREW LUMBAR THREE-FOUR;  Surgeon: Bedelia Person, MD;  Location: [facility] OR;   Service: Neurosurgery;  Laterality: Right;   PARATHYROIDECTOMY N/A 06/27/2016   Procedure: MINIMALLY INVASIVE PARATHYROIDECTOMY;  Surgeon: Gaynelle Adu, MD;  Location: [facility] ORS;  Service: General;  Laterality: N/A;   PARATHYROIDECTOMY Right 02/09/2022   Procedure: PARATHYROIDECTOMY;  Surgeon: Kinsinger, De Blanch, MD;  Location: [facility] ORS;  Service: General;  Laterality: Right;   SHOULDER ARTHROSCOPY W/ ROTATOR CUFF REPAIR  2010   to right shoulder   SPINE SURGERY  12/2010   TONSILLECTOMY     TOTAL KNEE ARTHROPLASTY Right 08/12/2014   Procedure: RIGHT TOTAL KNEE ARTHROPLASTY;  Surgeon: Vickki Hearing, MD;  Location: [facility] ORS;  Service: Orthopedics;  Laterality: Right;   TUBAL LIGATION  1999   Patient Active Problem List   Diagnosis Date Noted   Dry cough 01/18/2023   Breast cancer screening by mammogram 01/18/2023   S/P lumbar fusion 12/26/2022   Memory changes 12/21/2022   Chronic musculoskeletal pain 11/29/2022   Type 2 diabetes mellitus with stage 3a chronic kidney disease, without long-term current use of insulin (HCC) 11/09/2021   Hypercalcemia 11/09/2021   Encounter for annual physical exam 07/11/2021   Depression, recurrent (HCC) 05/13/2021   Recurrent depression (HCC)  05/13/2021   Metatarsal stress fracture with routine healing 12/30/2018   Diabetes mellitus without complication (HCC) 11/22/2016   Osteoporosis with current pathological fracture 04/03/2016   Arthritis of knee, right 08/12/2014   Primary osteoarthritis of right knee 07/27/2014   OA (osteoarthritis) of knee 06/16/2014   Primary hyperparathyroidism (HCC) 06/16/2014   Tinea versicolor 06/16/2014   Body mass index (BMI) 33.0-33.9, adult 04/06/2013   Lack of coordination 10/21/2012   History of cerebrovascular accident 10/10/2012   Vitamin D deficiency 11/10/2011   Hearing loss 06/29/2011   Carotid bruit 06/29/2011   Lumbar back pain with radiculopathy affecting left lower extremity 06/28/2010   Major depression  04/04/2010   PERIPHERAL EDEMA 04/08/2009   Peripheral edema 04/08/2009   Osteoarthritis 03/23/2008   Mixed hyperlipidemia 11/18/2007   Insomnia 10/17/2007   Asthma 11/30/2006   Essential hypertension 09/05/2006   Arthropathy 09/05/2006   Vertigo 09/05/2006   Arthropathy 09/05/2006    PCP: Billie Lade, MDRef Provider (PCP)   REFERRING PROVIDER:   Bedelia Person, MD    REFERRING DIAG:  Free Text Diagnosis  RADICULOPATHY LUMBR REGION    Rationale for Evaluation and Treatment: Habilitation  THERAPY DIAG:  Other low back pain  Difficulty in walking, not elsewhere classified  ONSET DATE: August 2024    SUBJECTIVE:                                                                                                                                                                                           SUBJECTIVE STATEMENT: Patient had a fall downstairs Aug 2024. Pt went to her Ortho due to her Osteoporosis/ Hips. Dr Romeo Apple performed Xray of hips in October 2024. MRI results came 08/27/23. Patient had cortisone to low back. Pt was referred to PT by Dr. Maisie Fus in Jan 2024.    PERTINENT HISTORY:  Ant Lat Lumbar Fusion Right TKR 2015 Osteoporosis Right shoulder replacement 2010 Left CVA- right sided weakness   PAIN:  Are you having pain? Yes: NPRS scale: 4/10 Pain location: gluteals down left lower extremity  Pain description: shooting/numb Aggravating factors: constant  Relieving factors: varies   PRECAUTIONS: None  RED FLAGS: None   WEIGHT BEARING RESTRICTIONS: No  FALLS:  Has patient fallen in last 6 months? Yes. Number of falls 1   OCCUPATION: retired   PLOF: Independent with household mobility with device  PATIENT GOALS: To walk pain free   NEXT MD VISIT: March 3rd    OBJECTIVE:   DIAGNOSTIC FINDINGS:  IMPRESSION: 1. Symptomatic level with regard to left leg radiating pain appears to be L4-L5, where chronic grade 1 spondylolisthesis  and  facet arthropathy appear progressed since the February MRI with new associated 11 mm synovial cyst on the left. Subsequent severe new left lateral recess stenosis, query left L5 radiculitis. 2. Interval decompression and fusion at L3-L4 with resolved stenosis and no adverse features. 3. Comparatively mild adjacent segment disease at L2-L3 although mild to moderate lateral recess stenosis there does appear increased since February and greater on the left. Query left L3 radiculitis. 4. Other lumbar levels are stable  PATIENT SURVEYS:  LEFS Lower Extremity Functional Score: 38 / 80 = 47.5 %  SCREENING FOR RED FLAGS: Bowel or bladder incontinence: No Spinal tumors: No Cauda equina syndrome: No Compression fracture: No Abdominal aneurysm: No  COGNITION: Overall cognitive status: Within functional limits for tasks assessed  POSTURE: weight shift left      FUNCTIONAL TESTS:  5 times sit to stand: 17.18s with PT stand-by assist. No upper extremity assistance    GAIT ANALYSIS: Distance walked: 83ft Assistive device utilized: Single point cane Level of assistance: Complete Independence Comments: Patient with minimal unsteadiness using single point cane   SENSATION: WFL    LOWER EXTREMITY MMT:    MMT Right eval Left eval  Hip flexion 3-/5 3-/5  Hip extension    Hip abduction    Hip adduction    Hip internal rotation    Hip external rotation    Knee flexion 3+/5 3+/5  Knee extension 4-/5 4-/5  Ankle dorsiflexion    Ankle plantarflexion    Ankle inversion    Ankle eversion     (Blank rows = not tested)  LOWER EXTREMITY ROM:     Passive  Right eval Left eval  Hip flexion    Hip extension    Hip abduction    Hip adduction    Hip internal rotation    Hip external rotation    Knee flexion    Knee extension (2) WFL  Ankle dorsiflexion    Ankle plantarflexion    Ankle inversion    Ankle eversion     (Blank rows = not tested)  LOWER EXTREMITY SPECIAL  TESTS  Luisa Hart (FABER) test: negative      PALPATION:  Moderate TTP lumbar paraspinal  INTEGUMENTARY  WFL      TODAY'S TREATMENT:                                                                                                                              DATE:   11/06/23 PT I.E.   PATIENT EDUCATION:  Education details: activity modification  Person educated: Patient Education method: Explanation Education comprehension: verbalized understanding  HOME EXERCISE PROGRAM: TBD    ASSESSMENT:  CLINICAL IMPRESSION: Patient is a 68 y.o. y.o. female who was seen today for physical therapy evaluation and treatment for other low back pain, difficulty walking. Patient presents to PT with the following objective impairments: Abnormal gait, decreased activity tolerance, difficulty walking, decreased ROM, decreased strength, postural dysfunction, and pain. These impairments limit  the patient in activities such as carrying, lifting, bending, standing, squatting, stairs, locomotion level, and caring for others. These impairments also limit the patient in participation such as meal prep, cleaning, laundry, community activity, occupation, and yard work. The patient will benefit from PT to address the limitations/impairments listed below to return to their prior level of function in the domains of activity and participation.    PERSONAL FACTORS:  PMH of CVA  are also affecting patient's functional outcome.   REHAB POTENTIAL: Fair    CLINICAL DECISION MAKING: Stable/uncomplicated  EVALUATION COMPLEXITY: Low     GOALS: Goals reviewed with patient? No  SHORT TERM GOALS: Target date: 3 session    1. Patient will be independent with a basic stretching/strengthening HEP  Baseline:  Goal status: INITIAL   LONG TERM GOALS: Target date: 6 sessions   Patient will score a >/= 47/80  on the LEFS    to demonstrate a Minimally Clinically Importance Difference (MCID) in ADL completion,  home/community ambulation, and lifting/bending/squatting. Baseline:  Goal status: INITIAL  2.  Patient will complete the 5 times sit to stand:    within 15s to demonstrate an improvement in ADL completion, stair negotiation, household/community ambulation, and self-care Baseline:  Goal status: INITIAL  3.  Patient will be independent with a comprehensive strengthening HEP  Baseline:  Goal status: INITIAL      PLAN:  PT FREQUENCY: 1-2x/week  PT DURATION:  6 sessions  PLANNED INTERVENTIONS: 97110-Therapeutic exercises, 97530- Therapeutic activity, 97112- Neuromuscular re-education, 97535- Self Care, 30865- Manual therapy, [phone]- Gait training, 97014- Electrical stimulation (unattended), [phone]- Electrical stimulation (manual), Patient/Family education, Balance training, Stair training, Dry Needling, Joint mobilization, Joint manipulation, Spinal manipulation, Spinal mobilization, Cryotherapy, and Moist heat.  PLAN FOR NEXT SESSION: Progress lower extremity strengthening    Seymour Bars, PT 11/06/2023, 3:15 PM

## 2023-11-10 DIAGNOSIS — E119 Type 2 diabetes mellitus without complications: Secondary | ICD-10-CM | POA: Diagnosis not present

## 2023-11-13 ENCOUNTER — Telehealth (HOSPITAL_COMMUNITY): Payer: Self-pay

## 2023-11-13 ENCOUNTER — Encounter (HOSPITAL_COMMUNITY): Payer: Medicare Other

## 2023-11-13 NOTE — Telephone Encounter (Signed)
No show, called and spoke to pt who had forgotten about apt today. Reminded next apt date and time with contact information included if needs to cancel or reschedule in future.   Becky Sax, LPTA/CLT; Rowe Clack [phone]

## 2023-11-15 ENCOUNTER — Ambulatory Visit (HOSPITAL_COMMUNITY): Payer: Medicare Other | Attending: Internal Medicine | Admitting: Physical Therapy

## 2023-11-15 DIAGNOSIS — R262 Difficulty in walking, not elsewhere classified: Secondary | ICD-10-CM | POA: Insufficient documentation

## 2023-11-15 DIAGNOSIS — M5459 Other low back pain: Secondary | ICD-10-CM | POA: Insufficient documentation

## 2023-11-15 NOTE — Therapy (Signed)
 [facility] OUTPATIENT PHYSICAL THERAPY EVALUATION (LOWER EXTREMITY)   Patient Name: Teresa Soto MRN: [id number] DOB:[birth year], 68 y.o., female Today's Date: 11/15/2023  END OF SESSION:   PT End of Session - 11/15/23 0947     Visit Number 2    Number of Visits 6    Date for PT Re-Evaluation 12/18/23    Authorization Type BCBS Medicare    Authorization Time Period shara champagne 11/06/23    PT Start Time 0942    PT Stop Time 1020    PT Time Calculation (min) 38 min    Activity Tolerance Patient tolerated treatment well    Behavior During Therapy [facility] for tasks assessed/performed             12/18/2023   Past Medical History:  Diagnosis Date   Allergy Hayfever, dust, peppers   Arthritis of knee, right 08/12/2014   Asthma    Phreesia 06/16/2020   Chronic back pain    Chronic constipation    Depression    Diabetes mellitus without complication (HCC)    Phreesia 06/16/2020   Heart murmur    History of bronchitis    History of urinary tract infection    Hyperlipidemia    Phreesia 06/16/2020   Hypertension    Meniere disease    vertigo   Neuropathic pain    Osteoporosis    Phreesia 06/16/2020   Parathyroid  adenoma    Poor fine motor skills    secondary to CVA per right side    PTSD (post-traumatic stress disorder)    Restless legs syndrome 2007 approx   Seasonal allergies    Shortness of breath dyspnea    coldness    Stroke (HCC) 2015   Past Surgical History:  Procedure Laterality Date   ANTERIOR LAT LUMBAR FUSION Right 12/26/2022   Procedure: Direct Lateral Interbody Fusion Lumbar three-four, Right  Prone  Transpsoas;  Surgeon: Debby Dorn MATSU, MD;  Location: [facility] OR;  Service: Neurosurgery;  Laterality: Right;   BACK SURGERY  2012   L1-L2  x2   CESAREAN SECTION     x 2   JOINT REPLACEMENT     LUMBAR PERCUTANEOUS PEDICLE SCREW 1 LEVEL Right 12/26/2022   Procedure: LUMBAR PERCUTANEOUS PEDICLE SCREW LUMBAR THREE-FOUR;  Surgeon: Debby Dorn MATSU, MD;  Location: [facility] OR;   Service: Neurosurgery;  Laterality: Right;   PARATHYROIDECTOMY N/A 06/27/2016   Procedure: MINIMALLY INVASIVE PARATHYROIDECTOMY;  Surgeon: Camellia Blush, MD;  Location: [facility] ORS;  Service: General;  Laterality: N/A;   PARATHYROIDECTOMY Right 02/09/2022   Procedure: PARATHYROIDECTOMY;  Surgeon: Kinsinger, Herlene Righter, MD;  Location: [facility] ORS;  Service: General;  Laterality: Right;   SHOULDER ARTHROSCOPY W/ ROTATOR CUFF REPAIR  2010   to right shoulder   SPINE SURGERY  12/2010   TONSILLECTOMY     TOTAL KNEE ARTHROPLASTY Right 08/12/2014   Procedure: RIGHT TOTAL KNEE ARTHROPLASTY;  Surgeon: Taft FORBES Minerva, MD;  Location: [facility];  Service: Orthopedics;  Laterality: Right;   TUBAL LIGATION  1999   Patient Active Problem List   Diagnosis Date Noted   Dry cough 01/18/2023   Breast cancer screening by mammogram 01/18/2023   S/P lumbar fusion 12/26/2022   Memory changes 12/21/2022   Chronic musculoskeletal pain 11/29/2022   Type 2 diabetes mellitus with stage 3a chronic kidney disease, without long-term current use of insulin  (HCC) 11/09/2021   Hypercalcemia 11/09/2021   Encounter for annual physical exam 07/11/2021   Depression, recurrent (HCC) 05/13/2021   Recurrent depression (HCC)  05/13/2021   Metatarsal stress fracture with routine healing 12/30/2018   Diabetes mellitus without complication (HCC) 11/22/2016   Osteoporosis with current pathological fracture 04/03/2016   Arthritis of knee, right 08/12/2014   Primary osteoarthritis of right knee 07/27/2014   OA (osteoarthritis) of knee 06/16/2014   Primary hyperparathyroidism (HCC) 06/16/2014   Tinea versicolor 06/16/2014   Body mass index (BMI) 33.0-33.9, adult 04/06/2013   Lack of coordination 10/21/2012   History of cerebrovascular accident 10/10/2012   Vitamin D  deficiency 11/10/2011   Hearing loss 06/29/2011   Carotid bruit 06/29/2011   Lumbar back pain with radiculopathy affecting left lower extremity 06/28/2010   Major depression  04/04/2010   PERIPHERAL EDEMA 04/08/2009   Peripheral edema 04/08/2009   Osteoarthritis 03/23/2008   Mixed hyperlipidemia 11/18/2007   Insomnia 10/17/2007   Asthma 11/30/2006   Essential hypertension 09/05/2006   Arthropathy 09/05/2006   Vertigo 09/05/2006   Arthropathy 09/05/2006    PCP: Melvenia Manus BRAVO, MDRef Provider (PCP)   REFERRING PROVIDER:   Debby Dorn MATSU, MD    REFERRING DIAG:  Free Text Diagnosis  RADICULOPATHY LUMBR REGION    Rationale for Evaluation and Treatment: Habilitation  THERAPY DIAG:  Other low back pain  Difficulty in walking, not elsewhere classified  ONSET DATE: August 2024    SUBJECTIVE:                                                                                                                                                                                           SUBJECTIVE STATEMENT: Pt reports no issues currently.    Evaluation: Patient had a fall downstairs Aug 2024. Pt went to her Ortho due to her Osteoporosis/ Hips. Dr Margrette performed Xray of hips in October 2024. MRI results came 08/27/23. Patient had cortisone to low back. Pt was referred to PT by Dr. Debby in Jan 2024.    PERTINENT HISTORY:  Ant Lat Lumbar Fusion Right TKR 2015 Osteoporosis Right shoulder replacement 2010 Left CVA- right sided weakness   PAIN:  Are you having pain? Yes: NPRS scale: 4/10 Pain location: gluteals down left lower extremity  Pain description: shooting/numb Aggravating factors: constant  Relieving factors: varies   PRECAUTIONS: None  RED FLAGS: None   WEIGHT BEARING RESTRICTIONS: No  FALLS:  Has patient fallen in last 6 months? Yes. Number of falls 1   OCCUPATION: retired   PLOF: Independent with household mobility with device  PATIENT GOALS: To walk pain free   NEXT MD VISIT: March 3rd    OBJECTIVE:   DIAGNOSTIC FINDINGS:  IMPRESSION: 1. Symptomatic level with regard to left leg radiating pain appears to  be  L4-L5, where chronic grade 1 spondylolisthesis and facet arthropathy appear progressed since the February MRI with new associated 11 mm synovial cyst on the left. Subsequent severe new left lateral recess stenosis, query left L5 radiculitis. 2. Interval decompression and fusion at L3-L4 with resolved stenosis and no adverse features. 3. Comparatively mild adjacent segment disease at L2-L3 although mild to moderate lateral recess stenosis there does appear increased since February and greater on the left. Query left L3 radiculitis. 4. Other lumbar levels are stable  PATIENT SURVEYS:  LEFS Lower Extremity Functional Score: 38 / 80 = 47.5 %  SCREENING FOR RED FLAGS: Bowel or bladder incontinence: No Spinal tumors: No Cauda equina syndrome: No Compression fracture: No Abdominal aneurysm: No  COGNITION: Overall cognitive status: Within functional limits for tasks assessed  POSTURE: weight shift left      FUNCTIONAL TESTS:  5 times sit to stand: 17.18s with PT stand-by assist. No upper extremity assistance    GAIT ANALYSIS: Distance walked: 30ft Assistive device utilized: Single point cane Level of assistance: Complete Independence Comments: Patient with minimal unsteadiness using single point cane   SENSATION: WFL    LOWER EXTREMITY MMT:    MMT Right eval Left eval  Hip flexion 3-/5 3-/5  Hip extension    Hip abduction    Hip adduction    Hip internal rotation    Hip external rotation    Knee flexion 3+/5 3+/5  Knee extension 4-/5 4-/5  Ankle dorsiflexion    Ankle plantarflexion    Ankle inversion    Ankle eversion     (Blank rows = not tested)  LOWER EXTREMITY ROM:     Passive  Right eval Left eval  Hip flexion    Hip extension    Hip abduction    Hip adduction    Hip internal rotation    Hip external rotation    Knee flexion    Knee extension (2) WFL  Ankle dorsiflexion    Ankle plantarflexion    Ankle inversion    Ankle eversion     (Blank  rows = not tested)  LOWER EXTREMITY SPECIAL TESTS  Belvie (FABER) test: negative      PALPATION:  Moderate TTP lumbar paraspinal  INTEGUMENTARY  WFL      TODAY'S TREATMENT:                                                                                                                              DATE:  11/15/23 Goal review/POC moving forward Supine: abdominal isometric 10X5  Bridge 10X  SLR 10X each Seated:  LAQ 10X5  Sit to stands 10X no UE assist  11/06/23 PT I.E.   PATIENT EDUCATION:  Education details: activity modification  Person educated: Patient Education method: Explanation Education comprehension: verbalized understanding  HOME EXERCISE PROGRAM: TBD  Access Code: [email or web address] URL: [email or web address]  Date: 11/15/2023 Prepared by: Greig Fuse Exercises - Supine Transversus Abdominis Bracing with Pelvic  Floor Contraction  - 2 x daily - 7 x weekly - 3 sets - 10 reps - 5 sec hold - Beginner Bridge  - 2 x daily - 7 x weekly - 10 reps - Straight Leg Raise  - 2 x daily - 7 x weekly - 10 reps - Seated Long Arc Quad  - 2 x daily - 7 x weekly - 10 reps - 5 sec hold - Sit to Stand Without Arm Support  - 2 x daily - 7 x weekly - 10 reps    ASSESSMENT:  CLINICAL IMPRESSION: Reviewed goals and POC mvoin forward. Began basic HEP for LE strengthening.  Pt able to complete with minimal cues needed, mostly only to complete slowly and controlled.  Pt without any pain or issues during session today.  Pt will benefit from therapy to address the limitations/impairments and return to prior level of function in the domains of activity and participation.    PERSONAL FACTORS:  PMH of CVA  are also affecting patient's functional outcome.   REHAB POTENTIAL: Fair    CLINICAL DECISION MAKING: Stable/uncomplicated  EVALUATION COMPLEXITY: Low     GOALS: Goals reviewed with patient? Yes  SHORT TERM GOALS: Target date: 3 session    1. Patient will be  independent with a basic stretching/strengthening HEP  Baseline:  Goal status: IN PROGRESS   LONG TERM GOALS: Target date: 6 sessions   Patient will score a >/= 47/80  on the LEFS    to demonstrate a Minimally Clinically Importance Difference (MCID) in ADL completion, home/community ambulation, and lifting/bending/squatting. Baseline:  Goal status: IN PROGRESS  2.  Patient will complete the 5 times sit to stand:    within 15s to demonstrate an improvement in ADL completion, stair negotiation, household/community ambulation, and self-care Baseline:  Goal status: IN PROGRESS  3.  Patient will be independent with a comprehensive strengthening HEP  Baseline:  Goal status: IN PROGRESS      PLAN:  PT FREQUENCY: 1-2x/week  PT DURATION:  6 sessions  PLANNED INTERVENTIONS: 97110-Therapeutic exercises, 97530- Therapeutic activity, 97112- Neuromuscular re-education, 97535- Self Care, 02859- Manual therapy, [phone]- Gait training, 97014- Electrical stimulation (unattended), [phone]- Electrical stimulation (manual), Patient/Family education, Balance training, Stair training, Dry Needling, Joint mobilization, Joint manipulation, Spinal manipulation, Spinal mobilization, Cryotherapy, and Moist heat.  PLAN FOR NEXT SESSION: Progress lower extremity strengthening    Vivian Greig NOVAK, PTA 11/15/2023, 9:48 AM

## 2023-11-20 ENCOUNTER — Encounter (HOSPITAL_COMMUNITY): Payer: Medicare Other | Admitting: Physical Therapy

## 2023-11-22 ENCOUNTER — Ambulatory Visit (HOSPITAL_COMMUNITY): Payer: Medicare Other | Admitting: Physical Therapy

## 2023-11-22 DIAGNOSIS — M5459 Other low back pain: Secondary | ICD-10-CM | POA: Diagnosis not present

## 2023-11-22 DIAGNOSIS — R262 Difficulty in walking, not elsewhere classified: Secondary | ICD-10-CM | POA: Diagnosis not present

## 2023-11-22 NOTE — Therapy (Signed)
[facility] OUTPATIENT PHYSICAL THERAPY TREATMENT   Patient Name: Teresa Soto MRN: [id number] DOB:1956/08/13, 68 y.o., female Today's Date: 11/22/2023  END OF SESSION:   PT End of Session - 11/22/23 1030     Visit Number 3    Number of Visits 6    Date for PT Re-Evaluation 12/18/23    Authorization Type BCBS Medicare    Authorization Time Period Chestine Spore 11/06/23    PT Start Time 1022    PT Stop Time 1100    PT Time Calculation (min) 38 min    Activity Tolerance Patient tolerated treatment well    Behavior During Therapy [facility] for tasks assessed/performed             12/18/2023   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    PTSD (post-traumatic stress disorder)    Restless legs syndrome 2007 approx   Seasonal allergies    Shortness of breath dyspnea    coldness    Stroke (HCC) 2015   Past Surgical History:  Procedure Laterality Date   ANTERIOR LAT LUMBAR FUSION Right 12/26/2022   Procedure: Direct Lateral Interbody Fusion Lumbar three-four, Right  Prone  Transpsoas;  Surgeon: Bedelia Person, MD;  Location: [facility] OR;  Service: Neurosurgery;  Laterality: Right;   BACK SURGERY  2012   L1-L2  x2   CESAREAN SECTION     x 2   JOINT REPLACEMENT     LUMBAR PERCUTANEOUS PEDICLE SCREW 1 LEVEL Right 12/26/2022   Procedure: LUMBAR PERCUTANEOUS PEDICLE SCREW LUMBAR THREE-FOUR;  Surgeon: Bedelia Person, MD;  Location: [facility] OR;  Service:  Neurosurgery;  Laterality: Right;   PARATHYROIDECTOMY N/A 06/27/2016   Procedure: MINIMALLY INVASIVE PARATHYROIDECTOMY;  Surgeon: Gaynelle Adu, MD;  Location: [facility] ORS;  Service: General;  Laterality: N/A;   PARATHYROIDECTOMY Right 02/09/2022   Procedure: PARATHYROIDECTOMY;  Surgeon: Kinsinger, De Blanch, MD;  Location: [facility] ORS;  Service: General;  Laterality: Right;   SHOULDER ARTHROSCOPY W/ ROTATOR CUFF REPAIR  2010   to right shoulder   SPINE SURGERY  12/2010   TONSILLECTOMY     TOTAL KNEE ARTHROPLASTY Right 08/12/2014   Procedure: RIGHT TOTAL KNEE ARTHROPLASTY;  Surgeon: Vickki Hearing, MD;  Location: [facility] ORS;  Service: Orthopedics;  Laterality: Right;   TUBAL LIGATION  1999   Patient Active Problem List   Diagnosis Date Noted   Dry cough 01/18/2023   Breast cancer screening by mammogram 01/18/2023   S/P lumbar fusion 12/26/2022   Memory changes 12/21/2022   Chronic musculoskeletal pain 11/29/2022   Type 2 diabetes mellitus with stage 3a chronic kidney disease, without long-term current use of insulin (HCC) 11/09/2021   Hypercalcemia 11/09/2021   Encounter for annual physical exam 07/11/2021   Depression, recurrent (HCC) 05/13/2021   Recurrent depression (HCC) 05/13/2021  Metatarsal stress fracture with routine healing 12/30/2018   Diabetes mellitus without complication (HCC) 11/22/2016   Osteoporosis with current pathological fracture 04/03/2016   Arthritis of knee, right 08/12/2014   Primary osteoarthritis of right knee 07/27/2014   OA (osteoarthritis) of knee 06/16/2014   Primary hyperparathyroidism (HCC) 06/16/2014   Tinea versicolor 06/16/2014   Body mass index (BMI) 33.0-33.9, adult 04/06/2013   Lack of coordination 10/21/2012   History of cerebrovascular accident 10/10/2012   Vitamin D deficiency 11/10/2011   Hearing loss 06/29/2011   Carotid bruit 06/29/2011   Lumbar back pain with radiculopathy affecting left lower extremity 06/28/2010   Major depression  04/04/2010   PERIPHERAL EDEMA 04/08/2009   Peripheral edema 04/08/2009   Osteoarthritis 03/23/2008   Mixed hyperlipidemia 11/18/2007   Insomnia 10/17/2007   Asthma 11/30/2006   Essential hypertension 09/05/2006   Arthropathy 09/05/2006   Vertigo 09/05/2006   Arthropathy 09/05/2006    PCP: Billie Lade, MDRef Provider (PCP)   REFERRING PROVIDER:   Bedelia Person, MD    REFERRING DIAG:  Free Text Diagnosis  RADICULOPATHY LUMBR REGION    Rationale for Evaluation and Treatment: Habilitation  THERAPY DIAG:  Other low back pain  Difficulty in walking, not elsewhere classified  ONSET DATE: August 2024    SUBJECTIVE:                                                                                                                                                                                           SUBJECTIVE STATEMENT: 11/22/23:  pt reports increased pain in her lower back today at 8/10 with pain going down her Lt LE all the way to her ankle.     Evaluation: Patient had a fall downstairs Aug 2024. Pt went to her Ortho due to her Osteoporosis/ Hips. Dr Romeo Apple performed Xray of hips in October 2024. MRI results came 08/27/23. Patient had cortisone to low back. Pt was referred to PT by Dr. Maisie Fus in Jan 2024.    PERTINENT HISTORY:  Ant Lat Lumbar Fusion Right TKR 2015 Osteoporosis Right shoulder replacement 2010 Left CVA- right sided weakness   PAIN:  Are you having pain? Yes: NPRS scale: 4/10 Pain location: gluteals down left lower extremity  Pain description: shooting/numb Aggravating factors: constant  Relieving factors: varies   PRECAUTIONS: None  RED FLAGS: None   WEIGHT BEARING RESTRICTIONS: No  FALLS:  Has patient fallen in last 6 months? Yes. Number of falls 1   OCCUPATION: retired   PLOF: Independent with household mobility with device  PATIENT GOALS: To walk pain free   NEXT MD VISIT: March 3rd  OBJECTIVE:   DIAGNOSTIC  FINDINGS:  IMPRESSION: 1. Symptomatic level with regard to left leg radiating pain appears to be L4-L5, where chronic grade 1 spondylolisthesis and facet arthropathy appear progressed since the February MRI with new associated 11 mm synovial cyst on the left. Subsequent severe new left lateral recess stenosis, query left L5 radiculitis. 2. Interval decompression and fusion at L3-L4 with resolved stenosis and no adverse features. 3. Comparatively mild adjacent segment disease at L2-L3 although mild to moderate lateral recess stenosis there does appear increased since February and greater on the left. Query left L3 radiculitis. 4. Other lumbar levels are stable  PATIENT SURVEYS:  LEFS Lower Extremity Functional Score: 38 / 80 = 47.5 %  SCREENING FOR RED FLAGS: Bowel or bladder incontinence: No Spinal tumors: No Cauda equina syndrome: No Compression fracture: No Abdominal aneurysm: No  COGNITION: Overall cognitive status: Within functional limits for tasks assessed  POSTURE: weight shift left      FUNCTIONAL TESTS:  5 times sit to stand: 17.18s with PT stand-by assist. No upper extremity assistance    GAIT ANALYSIS: Distance walked: 65ft Assistive device utilized: Single point cane Level of assistance: Complete Independence Comments: Patient with minimal unsteadiness using single point cane   SENSATION: WFL    LOWER EXTREMITY MMT:    MMT Right eval Left eval  Hip flexion 3-/5 3-/5  Hip extension    Hip abduction    Hip adduction    Hip internal rotation    Hip external rotation    Knee flexion 3+/5 3+/5  Knee extension 4-/5 4-/5  Ankle dorsiflexion    Ankle plantarflexion    Ankle inversion    Ankle eversion     (Blank rows = not tested)  LOWER EXTREMITY ROM:     Passive  Right eval Left eval  Hip flexion    Hip extension    Hip abduction    Hip adduction    Hip internal rotation    Hip external rotation    Knee flexion    Knee extension (2)  WFL  Ankle dorsiflexion    Ankle plantarflexion    Ankle inversion    Ankle eversion     (Blank rows = not tested)  LOWER EXTREMITY SPECIAL TESTS  Luisa Hart (FABER) test: negative      PALPATION:  Moderate TTP lumbar paraspinal  INTEGUMENTARY  WFL      TODAY'S TREATMENT:                                                                                                                              DATE:  11/22/23 Standing:  lumbar extension 10X Seated:  piriformis stretch attempted (and in supine) but reported did not feel stretch Supine: hamstring stretch with strap 3X30"  Bridge 10X2 3"  SLR 10X2 each LE  11/15/23 Goal review/POC moving forward Supine: abdominal isometric 10X5  Bridge 10X  SLR 10X each Seated:  LAQ 10X5"  Sit to  stands 10X no UE assist  11/06/23 PT I.E.   PATIENT EDUCATION:  Education details: activity modification  Person educated: Patient Education method: Explanation Education comprehension: verbalized understanding  HOME EXERCISE PROGRAM: TBD at evaluation  Access Code: [email or web address] URL: [email or web address]  Date: 11/15/2023 Prepared by: Emeline Gins Exercises - Supine Transversus Abdominis Bracing with Pelvic Floor Contraction  - 2 x daily - 7 x weekly - 3 sets - 10 reps - 5 sec hold - Beginner Bridge  - 2 x daily - 7 x weekly - 10 reps - Straight Leg Raise  - 2 x daily - 7 x weekly - 10 reps - Seated Long Arc Quad  - 2 x daily - 7 x weekly - 10 reps - 5 sec hold - Sit to Stand Without Arm Support  - 2 x daily - 7 x weekly - 10 reps    ASSESSMENT:  CLINICAL IMPRESSION: Pt with reported pain today radiating into Lt LE.  Began with standing extensions which seemed to reduce slightly.  Instructed with piriformis stretch both in seated and supine, however pt reportedly did not feel any stretch or tightness in this mm.  Hamstring stretch did reveal tightness bilaterally and encouraged pt to continue these at home.  Continued with  LE strength and stability exercises as well.  PT required cues for increasing hold times and general form.  Overall reported improvement at end of session. Pt will continue to benefit from therapy to address her limitations/impairments and return to prior level of function in the domains of activity and participation.    PERSONAL FACTORS:  PMH of CVA  are also affecting patient's functional outcome.   REHAB POTENTIAL: Fair    CLINICAL DECISION MAKING: Stable/uncomplicated  EVALUATION COMPLEXITY: Low     GOALS: Goals reviewed with patient? Yes  SHORT TERM GOALS: Target date: 3 session    1. Patient will be independent with a basic stretching/strengthening HEP  Baseline:  Goal status: IN PROGRESS   LONG TERM GOALS: Target date: 6 sessions   Patient will score a >/= 47/80  on the LEFS    to demonstrate a Minimally Clinically Importance Difference (MCID) in ADL completion, home/community ambulation, and lifting/bending/squatting. Baseline:  Goal status: IN PROGRESS  2.  Patient will complete the 5 times sit to stand:    within 15s to demonstrate an improvement in ADL completion, stair negotiation, household/community ambulation, and self-care Baseline:  Goal status: IN PROGRESS  3.  Patient will be independent with a comprehensive strengthening HEP  Baseline:  Goal status: IN PROGRESS      PLAN:  PT FREQUENCY: 1-2x/week  PT DURATION:  6 sessions  PLANNED INTERVENTIONS: 97110-Therapeutic exercises, 97530- Therapeutic activity, 97112- Neuromuscular re-education, 97535- Self Care, 96295- Manual therapy, [phone]- Gait training, 97014- Electrical stimulation (unattended), [phone]- Electrical stimulation (manual), Patient/Family education, Balance training, Stair training, Dry Needling, Joint mobilization, Joint manipulation, Spinal manipulation, Spinal mobilization, Cryotherapy, and Moist heat.  PLAN FOR NEXT SESSION: Progress lower extremity strengthening    Lurena Nida,  PTA 11/22/2023, 10:30 AM

## 2023-11-23 ENCOUNTER — Encounter (HOSPITAL_COMMUNITY): Payer: Medicare Other

## 2023-11-23 ENCOUNTER — Telehealth (HOSPITAL_COMMUNITY): Payer: Self-pay

## 2023-11-23 NOTE — Telephone Encounter (Signed)
Called patient today for her 1st no show. Patient states that she forgot she has an appointment today and she overslept. Also informed patient about her future appointment.  Tish Frederickson. Lashaye Fisk, PT, DPT, OCS Board-Certified Clinical Specialist in Orthopedic PT PT Compact Privilege # ([place]): [phone] T

## 2023-11-27 ENCOUNTER — Encounter (HOSPITAL_COMMUNITY): Payer: Medicare Other

## 2023-11-30 ENCOUNTER — Encounter (HOSPITAL_COMMUNITY): Payer: Self-pay

## 2023-11-30 ENCOUNTER — Ambulatory Visit (HOSPITAL_COMMUNITY): Payer: Medicare Other

## 2023-11-30 DIAGNOSIS — M5459 Other low back pain: Secondary | ICD-10-CM

## 2023-11-30 DIAGNOSIS — R262 Difficulty in walking, not elsewhere classified: Secondary | ICD-10-CM | POA: Diagnosis not present

## 2023-11-30 NOTE — Therapy (Signed)
[facility] OUTPATIENT PHYSICAL THERAPY TREATMENT   Patient Name: DEYANNA MCTIER MRN: [id number] DOB:Jul 24, 1956, 68 y.o., female Today's Date: 11/30/2023  END OF SESSION:   PT End of Session - 11/30/23 0757     Visit Number 4    Number of Visits 6    Date for PT Re-Evaluation 12/18/23    Authorization Type BCBS Medicare    PT Start Time 0802    PT Stop Time 0847    PT Time Calculation (min) 45 min    Activity Tolerance Patient tolerated treatment well    Behavior During Therapy [facility] for tasks assessed/performed             12/18/2023   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    PTSD (post-traumatic stress disorder)    Restless legs syndrome 2007 approx   Seasonal allergies    Shortness of breath dyspnea    coldness    Stroke (HCC) 2015   Past Surgical History:  Procedure Laterality Date   ANTERIOR LAT LUMBAR FUSION Right 12/26/2022   Procedure: Direct Lateral Interbody Fusion Lumbar three-four, Right  Prone  Transpsoas;  Surgeon: Bedelia Person, MD;  Location: [facility] OR;  Service: Neurosurgery;  Laterality: Right;   BACK SURGERY  2012   L1-L2  x2   CESAREAN SECTION     x 2   JOINT REPLACEMENT     LUMBAR PERCUTANEOUS PEDICLE SCREW 1 LEVEL Right 12/26/2022   Procedure: LUMBAR PERCUTANEOUS PEDICLE SCREW LUMBAR THREE-FOUR;  Surgeon: Bedelia Person, MD;  Location: [facility] OR;  Service: Neurosurgery;  Laterality: Right;   PARATHYROIDECTOMY  N/A 06/27/2016   Procedure: MINIMALLY INVASIVE PARATHYROIDECTOMY;  Surgeon: Gaynelle Adu, MD;  Location: [facility] ORS;  Service: General;  Laterality: N/A;   PARATHYROIDECTOMY Right 02/09/2022   Procedure: PARATHYROIDECTOMY;  Surgeon: Kinsinger, De Blanch, MD;  Location: [facility] ORS;  Service: General;  Laterality: Right;   SHOULDER ARTHROSCOPY W/ ROTATOR CUFF REPAIR  2010   to right shoulder   SPINE SURGERY  12/2010   TONSILLECTOMY     TOTAL KNEE ARTHROPLASTY Right 08/12/2014   Procedure: RIGHT TOTAL KNEE ARTHROPLASTY;  Surgeon: Vickki Hearing, MD;  Location: [facility] ORS;  Service: Orthopedics;  Laterality: Right;   TUBAL LIGATION  1999   Patient Active Problem List   Diagnosis Date Noted   Dry cough 01/18/2023   Breast cancer screening by mammogram 01/18/2023   S/P lumbar fusion 12/26/2022   Memory changes 12/21/2022   Chronic musculoskeletal pain 11/29/2022   Type 2 diabetes mellitus with stage 3a chronic kidney disease, without long-term current use of insulin (HCC) 11/09/2021   Hypercalcemia 11/09/2021   Encounter for annual physical exam 07/11/2021   Depression, recurrent (HCC) 05/13/2021   Recurrent depression (HCC) 05/13/2021   Metatarsal stress fracture with routine healing 12/30/2018  Diabetes mellitus without complication (HCC) 11/22/2016   Osteoporosis with current pathological fracture 04/03/2016   Arthritis of knee, right 08/12/2014   Primary osteoarthritis of right knee 07/27/2014   OA (osteoarthritis) of knee 06/16/2014   Primary hyperparathyroidism (HCC) 06/16/2014   Tinea versicolor 06/16/2014   Body mass index (BMI) 33.0-33.9, adult 04/06/2013   Lack of coordination 10/21/2012   History of cerebrovascular accident 10/10/2012   Vitamin D deficiency 11/10/2011   Hearing loss 06/29/2011   Carotid bruit 06/29/2011   Lumbar back pain with radiculopathy affecting left lower extremity 06/28/2010   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/04/2010   PERIPHERAL EDEMA 04/08/2009   Peripheral edema  04/08/2009   Osteoarthritis 03/23/2008   Mixed hyperlipidemia 11/18/2007   Insomnia 10/17/2007   Asthma 11/30/2006   Essential hypertension 09/05/2006   Arthropathy 09/05/2006   Vertigo 09/05/2006   Arthropathy 09/05/2006    PCP: Billie Lade, MDRef Provider (PCP)   REFERRING PROVIDER:   Bedelia Person, MD    REFERRING DIAG:  Free Text Diagnosis  RADICULOPATHY LUMBR REGION    Rationale for Evaluation and Treatment: Habilitation  THERAPY DIAG:  Other low back pain  Difficulty in walking, not elsewhere classified  ONSET DATE: August 2024    SUBJECTIVE:                                                                                                                                                                                           SUBJECTIVE STATEMENT: 11/30/23:  Reports radicular symptoms down posterior Lt LE ending at calf, able to sit to with no pain though does have pain when standing or even when rolling over in bed.  Pain scale 6/10.   Evaluation: Patient had a fall downstairs Aug 2024. Pt went to her Ortho due to her Osteoporosis/ Hips. Dr Romeo Apple performed Xray of hips in October 2024. MRI results came 08/27/23. Patient had cortisone to low back. Pt was referred to PT by Dr. Maisie Fus in Jan 2024.    PERTINENT HISTORY:  Ant Lat Lumbar Fusion Right TKR 2015 Osteoporosis Right shoulder replacement 2010 Left CVA- right sided weakness   PAIN:  Are you having pain? Yes: NPRS scale: 6/10 Pain location: gluteals down left lower extremity  Pain description: shooting/numb Aggravating factors: constant  Relieving factors: varies   PRECAUTIONS: None  RED FLAGS: None   WEIGHT BEARING RESTRICTIONS: No  FALLS:  Has patient fallen in last 6 months? Yes. Number of falls 1   OCCUPATION: retired   PLOF: Independent with household mobility with device  PATIENT GOALS: To walk pain free   NEXT MD VISIT: March 3rd    OBJECTIVE:  DIAGNOSTIC  FINDINGS:  IMPRESSION: 1. Symptomatic level with regard to left leg radiating pain appears to be L4-L5, where chronic grade 1 spondylolisthesis and facet arthropathy appear progressed since the February MRI with new associated 11 mm synovial cyst on the left. Subsequent severe new left lateral recess stenosis, query left L5 radiculitis. 2. Interval decompression and fusion at L3-L4 with resolved stenosis and no adverse features. 3. Comparatively mild adjacent segment disease at L2-L3 although mild to moderate lateral recess stenosis there does appear increased since February and greater on the left. Query left L3 radiculitis. 4. Other lumbar levels are stable  PATIENT SURVEYS:  LEFS Lower Extremity Functional Score: 38 / 80 = 47.5 %  SCREENING FOR RED FLAGS: Bowel or bladder incontinence: No Spinal tumors: No Cauda equina syndrome: No Compression fracture: No Abdominal aneurysm: No  COGNITION: Overall cognitive status: Within functional limits for tasks assessed  POSTURE: weight shift left      FUNCTIONAL TESTS:  5 times sit to stand: 17.18s with PT stand-by assist. No upper extremity assistance    GAIT ANALYSIS: Distance walked: 18ft Assistive device utilized: Single point cane Level of assistance: Complete Independence Comments: Patient with minimal unsteadiness using single point cane   SENSATION: WFL    LOWER EXTREMITY MMT:    MMT Right eval Left eval  Hip flexion 3-/5 3-/5  Hip extension    Hip abduction    Hip adduction    Hip internal rotation    Hip external rotation    Knee flexion 3+/5 3+/5  Knee extension 4-/5 4-/5  Ankle dorsiflexion    Ankle plantarflexion    Ankle inversion    Ankle eversion     (Blank rows = not tested)  LOWER EXTREMITY ROM:     Passive  Right eval Left eval  Hip flexion    Hip extension    Hip abduction    Hip adduction    Hip internal rotation    Hip external rotation    Knee flexion    Knee extension (2)  WFL  Ankle dorsiflexion    Ankle plantarflexion    Ankle inversion    Ankle eversion     (Blank rows = not tested)  LOWER EXTREMITY SPECIAL TESTS  Luisa Hart (FABER) test: negative      PALPATION:  Moderate TTP lumbar paraspinal  INTEGUMENTARY  WFL      TODAY'S TREATMENT:                                                                                                                              DATE:  11/28/23:   Supine:  Decompression 2-5 5x 5"  TrA  Posterior pelvic tilt 10x 5"  March with ab set  [facility] 2x 30" Sidelying:  Clam RTB 10x 5" 11/22/23 Standing:  lumbar extension 10X Seated:  piriformis stretch attempted (and in supine) but reported did not feel stretch Supine: hamstring stretch with strap 3X30"  Bridge 10X2 3"  SLR 10X2 each LE  11/15/23 Goal review/POC moving forward Supine: abdominal isometric 10X5  Bridge 10X  SLR 10X each Seated:  LAQ 10X5"  Sit to stands 10X no UE assist  11/06/23 PT I.E.   PATIENT EDUCATION:  Education details: activity modification  Person educated: Patient Education method: Explanation Education comprehension: verbalized understanding  HOME EXERCISE PROGRAM: TBD at evaluation  Access Code: [email or web address] URL: [email or web address]  Date: 11/15/2023 Prepared by: Emeline Gins Exercises - Supine Transversus Abdominis Bracing with Pelvic Floor Contraction  - 2 x daily - 7 x weekly - 3 sets - 10 reps - 5 sec hold - Beginner Bridge  - 2 x daily - 7 x weekly - 10 reps - Straight Leg Raise  - 2 x daily - 7 x weekly - 10 reps - Seated Long Arc Quad  - 2 x daily - 7 x weekly - 10 reps - 5 sec hold - Sit to Stand Without Arm Support  - 2 x daily - 7 x weekly - 10 reps  11/30/23: - Supine March  - 2 x daily - 7 x weekly - 1 sets - 10 reps - 5" hold - Hooklying Single Knee to Chest Stretch  - 2 x daily - 7 x weekly - 1 sets - 3 reps - 30" hold - Clam with Resistance  - 2 x daily - 7 x weekly - 1 sets - 10 reps - 5"  hold    ASSESSMENT:  CLINICAL IMPRESSION: Session focus with core and proximal proximal with focus on reducing radicular symptoms.  Pt educated on TrA activation for lumbar support with neuro re-ed to improve activation with verbal and tactile cueing as well as encouraged to engage with exhalation to reduce holding breath.  Following flexion based exercises pt with reports of improved centralization with pain only in hip.  Added to HEP with printout given and verbalized understanding.  PERSONAL FACTORS:  PMH of CVA  are also affecting patient's functional outcome.   REHAB POTENTIAL: Fair    CLINICAL DECISION MAKING: Stable/uncomplicated  EVALUATION COMPLEXITY: Low     GOALS: Goals reviewed with patient? Yes  SHORT TERM GOALS: Target date: 3 session    1. Patient will be independent with a basic stretching/strengthening HEP  Baseline:  Goal status: IN PROGRESS   LONG TERM GOALS: Target date: 6 sessions   Patient will score a >/= 47/80  on the LEFS    to demonstrate a Minimally Clinically Importance Difference (MCID) in ADL completion, home/community ambulation, and lifting/bending/squatting. Baseline:  Goal status: IN PROGRESS  2.  Patient will complete the 5 times sit to stand:    within 15s to demonstrate an improvement in ADL completion, stair negotiation, household/community ambulation, and self-care Baseline:  Goal status: IN PROGRESS  3.  Patient will be independent with a comprehensive strengthening HEP  Baseline:  Goal status: IN PROGRESS      PLAN:  PT FREQUENCY: 1-2x/week  PT DURATION:  6 sessions  PLANNED INTERVENTIONS: 97110-Therapeutic exercises, 97530- Therapeutic activity, 97112- Neuromuscular re-education, 97535- Self Care, 69629- Manual therapy, [phone]- Gait training, 97014- Electrical stimulation (unattended), [phone]- Electrical stimulation (manual), Patient/Family education, Balance training, Stair training, Dry Needling, Joint mobilization, Joint  manipulation, Spinal manipulation, Spinal mobilization, Cryotherapy, and Moist heat.  PLAN FOR NEXT SESSION: Progress lower extremity strengthening   Becky Sax, LPTA/CLT; CBIS [phone]  Juel Burrow, PTA 11/30/2023, 9:05 AM

## 2023-12-04 ENCOUNTER — Encounter (HOSPITAL_COMMUNITY): Payer: Medicare Other

## 2023-12-06 ENCOUNTER — Encounter (HOSPITAL_COMMUNITY): Payer: Self-pay

## 2023-12-06 ENCOUNTER — Encounter (HOSPITAL_COMMUNITY): Payer: Medicare Other

## 2023-12-06 NOTE — Therapy (Signed)
 [facility] Outpatient Rehabilitation at [facility] [address] [place], [place], [address] Phone: [phone]   Fax:  [phone]  Patient Details  Name: Teresa Soto MRN: [id number] Date of Birth: January 02, 1956 Referring Provider:  No ref. provider found  PHYSICAL THERAPY DISCHARGE SUMMARY  Spoke with patient today via phone at around 9:15 am to follow up with her appointment. Patient made an error on taking note of her appointment. Reminded patient to make more appointment but patient requested to be d/c from PT as she can do her exercises at home. Patient was reminded that she just needs a new referral if she wishes to come back. Patient gave excellent verbal understanding.  Visits from Start of Care: 4  Current functional level related to goals / functional outcomes: Unable to assess as patient was unable to return from the last visit 11/30/23   Remaining deficits: Unable to assess as patient was unable to return from the last visit 11/30/23   Education / Equipment: Unable to assess as patient was unable to return from the last visit 11/30/23   Patient agrees to discharge. Patient goals were  unable to assess as patient was unable to return from the last visit 11/30/23 . Patient is being discharged due to the patient's request.    Iantha Fallen L. Zaleah Ternes, PT, DPT, OCS Board-Certified Clinical Specialist in Orthopedic PT PT Compact Privilege # ([place]): [id number] T 12/06/2023, 9:19 AM  [facility] Outpatient Rehabilitation at [facility] [address] [place], [place], [address] Phone: [phone]   Fax:  [phone]

## 2023-12-08 DIAGNOSIS — E119 Type 2 diabetes mellitus without complications: Secondary | ICD-10-CM | POA: Diagnosis not present

## 2023-12-10 ENCOUNTER — Telehealth: Payer: Medicare Other | Admitting: Internal Medicine

## 2023-12-13 ENCOUNTER — Encounter: Payer: Self-pay | Admitting: Internal Medicine

## 2023-12-13 ENCOUNTER — Telehealth (INDEPENDENT_AMBULATORY_CARE_PROVIDER_SITE_OTHER): Admitting: Internal Medicine

## 2023-12-13 DIAGNOSIS — E1122 Type 2 diabetes mellitus with diabetic chronic kidney disease: Secondary | ICD-10-CM

## 2023-12-13 DIAGNOSIS — Z7984 Long term (current) use of oral hypoglycemic drugs: Secondary | ICD-10-CM

## 2023-12-13 DIAGNOSIS — M8000XA Age-related osteoporosis with current pathological fracture, unspecified site, initial encounter for fracture: Secondary | ICD-10-CM

## 2023-12-13 DIAGNOSIS — I10 Essential (primary) hypertension: Secondary | ICD-10-CM

## 2023-12-13 DIAGNOSIS — N1831 Chronic kidney disease, stage 3a: Secondary | ICD-10-CM

## 2023-12-13 DIAGNOSIS — E782 Mixed hyperlipidemia: Secondary | ICD-10-CM | POA: Diagnosis not present

## 2023-12-13 DIAGNOSIS — Z6833 Body mass index (BMI) 33.0-33.9, adult: Secondary | ICD-10-CM

## 2023-12-13 DIAGNOSIS — E559 Vitamin D deficiency, unspecified: Secondary | ICD-10-CM

## 2023-12-13 MED ORDER — ALENDRONATE SODIUM 70 MG PO TABS: ORAL_TABLET | ORAL | 0 refills | Status: DC

## 2023-12-13 NOTE — Assessment & Plan Note (Addendum)
 A1c 7.6 on labs from February 2024.  She is currently prescribed Jardiance 25 mg daily.  She is concerned about the cost of Jardiance going forward and states that she will need to explore other medication options if the cost remains high when she attempts to fill her next prescription. -Repeat A1c and urine microalbumin/creatinine ratio ordered today.

## 2023-12-13 NOTE — Progress Notes (Signed)
 Virtual Visit via Video Note  I connected with Teresa Soto on 12/13/23 at  3:20 PM EST by a video enabled telemedicine application and verified that I am speaking with the correct person using two identifiers.  Patient Location: Home Provider Location: Office/Clinic  I discussed the limitations, risks, security, and privacy concerns of performing an evaluation and management service by video and the availability of in person appointments. I also discussed with the patient that there may be a patient responsible charge related to this service. The patient expressed understanding and agreed to proceed.  Subjective: PCP: Billie Lade, MD  Chief Complaint  Patient presents with   Care Management   Ms. Kenna has been evaluated today for routine follow-up through virtual encounter.  Last evaluated by me in May 2024.  In the interim she has been evaluated by orthopedic surgery and neurosurgery in the setting of lumbar back pain with radiculopathy.  Today she reports feeling fairly well.  She is asymptomatic currently.  Her acute concern is that the cost of Jardiance has significantly increased.  She is unsure of if this was a one time payment or will be the cost every 3 months.  She would like to discuss other medication options if this will be the cost of Jardiance going forward.  ROS: Per HPI  Current Outpatient Medications:    albuterol (VENTOLIN HFA) 108 (90 Base) MCG/ACT inhaler, Inhale 2 puffs into the lungs every 6 (six) hours as needed for wheezing or shortness of breath., Disp: 8 g, Rfl: 3   aspirin EC 81 MG tablet, Take 1 tablet (81 mg total) by mouth daily., Disp: 30 tablet, Rfl: 12   atorvastatin (LIPITOR) 40 MG tablet, Take 1 tablet (40 mg total) by mouth daily., Disp: 90 tablet, Rfl: 3   benzonatate (TESSALON) 100 MG capsule, Take 1 capsule (100 mg total) by mouth 2 (two) times daily as needed for cough., Disp: 20 capsule, Rfl: 0   Blood Glucose Monitoring Suppl (BLOOD  GLUCOSE SYSTEM PAK) KIT, Please dispense based on patient and insurance preference. Use as directed to monitor FSBS 1x daily. Dx: E11.9., Disp: 1 each, Rfl: 1   Cholecalciferol 125 MCG (5000 UT) capsule, Take 5,000 Units by mouth daily., Disp: , Rfl:    escitalopram (LEXAPRO) 20 MG tablet, Take 1 tablet by mouth once daily, Disp: 90 tablet, Rfl: 0   fenofibrate (TRICOR) 145 MG tablet, Take 1 tablet by mouth once daily, Disp: 90 tablet, Rfl: 0   ferrous sulfate 325 (65 FE) MG EC tablet, Take 325 mg by mouth every morning., Disp: , Rfl:    gabapentin (NEURONTIN) 300 MG capsule, Take 1 capsule by mouth twice daily, Disp: 180 capsule, Rfl: 0   JARDIANCE 25 MG TABS tablet, TAKE 1 TABLET BY MOUTH ONCE DAILY BEFORE BREAKFAST, Disp: 90 tablet, Rfl: 0   ketoconazole (NIZORAL) 2 % shampoo, Apply 1 application topically 2 (two) times a week., Disp: 120 mL, Rfl: 0   Lancet Devices MISC, Please dispense based on patient and insurance preference. Use as directed to monitor FSBS 1x daily. Dx: E11.9., Disp: 1 each, Rfl: 1   Lancets MISC, Please dispense based on patient and insurance preference. Use as directed to monitor FSBS 1x daily. Dx: E11.9., Disp: 100 each, Rfl: 1   lisinopril (ZESTRIL) 10 MG tablet, Take 1 tablet by mouth once daily, Disp: 90 tablet, Rfl: 0   meclizine (ANTIVERT) 25 MG tablet, Take 25 mg by mouth 3 (three) times daily as  needed for dizziness., Disp: , Rfl:    montelukast (SINGULAIR) 10 MG tablet, Take 1 tablet (10 mg total) by mouth every evening., Disp: 90 tablet, Rfl: 1   ondansetron (ZOFRAN) 4 MG tablet, Take 1 tablet (4 mg total) by mouth every 8 (eight) hours as needed for vomiting or nausea., Disp: 30 tablet, Rfl: 0   polyethylene glycol powder (GLYCOLAX/MIRALAX) 17 GM/SCOOP powder, Take 17 g by mouth every other day., Disp: , Rfl:    potassium chloride SA (KLOR-CON M) 20 MEQ tablet, Take 1 tablet by mouth daily., Disp: 60 tablet, Rfl: 0   pramipexole (MIRAPEX) 0.25 MG tablet, Take 1  tablet by mouth once daily with breakfast, Disp: 90 tablet, Rfl: 0   promethazine-dextromethorphan (PROMETHAZINE-DM) 6.25-15 MG/5ML syrup, Take 2.5 mLs by mouth 4 (four) times daily as needed for cough., Disp: 118 mL, Rfl: 0  Assessment and Plan:  Osteoporosis with current pathological fracture, unspecified osteoporosis type, initial encounter Assessment & Plan: Followed by endocrinology (Dr. Fransico Him).  She is prescribed Fosamax 70 mg weekly, which she has been on since 2017.  Endocrinology follow-up is scheduled for 4/2.   Type 2 diabetes mellitus with stage 3a chronic kidney disease, without long-term current use of insulin (HCC) Assessment & Plan: A1c 7.6 on labs from February 2024.  She is currently prescribed Jardiance 25 mg daily.  She is concerned about the cost of Jardiance going forward and states that she will need to explore other medication options if the cost remains high when she attempts to fill her next prescription. -Repeat A1c and urine microalbumin/creatinine ratio ordered today.  Mixed hyperlipidemia Assessment & Plan: Currently prescribed atorvastatin 40 mg daily.  Repeat lipid panel ordered today.  Vitamin D deficiency Assessment & Plan: Noted on previous labs.  Repeat vitamin D level ordered today.  Follow Up Instructions: Return in about 3 months (around 03/14/2024).   I discussed the assessment and treatment plan with the patient. The patient was provided an opportunity to ask questions, and all were answered. The patient agreed with the plan and demonstrated an understanding of the instructions.   The patient was advised to call back or seek an in-person evaluation if the symptoms worsen or if the condition fails to improve as anticipated.  The above assessment and management plan was discussed with the patient. The patient verbalized understanding of and has agreed to the management plan.   Billie Lade, MD

## 2023-12-13 NOTE — Assessment & Plan Note (Signed)
 Followed by endocrinology (Dr. Fransico Him).  She is prescribed Fosamax 70 mg weekly, which she has been on since 2017.  Endocrinology follow-up is scheduled for 4/2.

## 2023-12-13 NOTE — Assessment & Plan Note (Signed)
 Noted on previous labs.  Repeat vitamin D level ordered today

## 2023-12-13 NOTE — Assessment & Plan Note (Signed)
 Currently prescribed atorvastatin 40 mg daily.  Repeat lipid panel ordered today.

## 2023-12-17 DIAGNOSIS — M5416 Radiculopathy, lumbar region: Secondary | ICD-10-CM | POA: Diagnosis not present

## 2023-12-21 ENCOUNTER — Other Ambulatory Visit (HOSPITAL_COMMUNITY): Payer: Self-pay | Admitting: Neurosurgery

## 2023-12-21 DIAGNOSIS — M5416 Radiculopathy, lumbar region: Secondary | ICD-10-CM

## 2023-12-27 ENCOUNTER — Encounter (HOSPITAL_COMMUNITY): Payer: Self-pay

## 2023-12-27 ENCOUNTER — Ambulatory Visit (HOSPITAL_COMMUNITY): Admission: RE | Admit: 2023-12-27 | Source: Ambulatory Visit

## 2023-12-30 ENCOUNTER — Other Ambulatory Visit: Payer: Self-pay | Admitting: Internal Medicine

## 2023-12-30 DIAGNOSIS — M7918 Myalgia, other site: Secondary | ICD-10-CM

## 2023-12-31 ENCOUNTER — Telehealth: Payer: Self-pay | Admitting: "Endocrinology

## 2023-12-31 NOTE — Telephone Encounter (Signed)
 Labs need updating.

## 2024-01-01 ENCOUNTER — Other Ambulatory Visit: Payer: Self-pay | Admitting: *Deleted

## 2024-01-01 DIAGNOSIS — E782 Mixed hyperlipidemia: Secondary | ICD-10-CM

## 2024-01-01 DIAGNOSIS — M8000XA Age-related osteoporosis with current pathological fracture, unspecified site, initial encounter for fracture: Secondary | ICD-10-CM

## 2024-01-01 DIAGNOSIS — I10 Essential (primary) hypertension: Secondary | ICD-10-CM

## 2024-01-01 DIAGNOSIS — E212 Other hyperparathyroidism: Secondary | ICD-10-CM

## 2024-01-01 DIAGNOSIS — E1165 Type 2 diabetes mellitus with hyperglycemia: Secondary | ICD-10-CM

## 2024-01-01 DIAGNOSIS — E119 Type 2 diabetes mellitus without complications: Secondary | ICD-10-CM

## 2024-01-01 NOTE — Telephone Encounter (Signed)
Labs have been updated . 

## 2024-01-08 DIAGNOSIS — E119 Type 2 diabetes mellitus without complications: Secondary | ICD-10-CM | POA: Diagnosis not present

## 2024-01-09 ENCOUNTER — Ambulatory Visit: Payer: Medicare Other | Admitting: "Endocrinology

## 2024-01-16 ENCOUNTER — Other Ambulatory Visit: Payer: Self-pay | Admitting: Internal Medicine

## 2024-02-07 DIAGNOSIS — E119 Type 2 diabetes mellitus without complications: Secondary | ICD-10-CM | POA: Diagnosis not present

## 2024-02-09 ENCOUNTER — Other Ambulatory Visit: Payer: Self-pay | Admitting: Internal Medicine

## 2024-02-12 ENCOUNTER — Encounter (INDEPENDENT_AMBULATORY_CARE_PROVIDER_SITE_OTHER): Payer: Self-pay

## 2024-02-12 ENCOUNTER — Ambulatory Visit (INDEPENDENT_AMBULATORY_CARE_PROVIDER_SITE_OTHER): Payer: Medicare Other | Admitting: Otolaryngology

## 2024-02-12 VITALS — BP 144/72 | HR 80 | Ht 61.0 in | Wt 160.0 lb

## 2024-02-12 DIAGNOSIS — H9311 Tinnitus, right ear: Secondary | ICD-10-CM

## 2024-02-12 DIAGNOSIS — H6983 Other specified disorders of Eustachian tube, bilateral: Secondary | ICD-10-CM

## 2024-02-12 DIAGNOSIS — R42 Dizziness and giddiness: Secondary | ICD-10-CM | POA: Diagnosis not present

## 2024-02-12 DIAGNOSIS — H9041 Sensorineural hearing loss, unilateral, right ear, 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: Secondary | ICD-10-CM

## 2024-02-12 NOTE — Progress Notes (Unsigned)
 Patient ID: Teresa Soto, female   DOB: 06-06-1956, 68 y.o.   MRN: [id number]  Follow up: Right Meniere's disease, right ear hearing loss and tinnitus  HPI: The patient is a 68 year old female who returns today for her follow-up evaluation. 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  She also has a history of left ear eustachian tube dysfunction.  She was treated with a 1500 mg low-salt diet, Nasacort nasal spray, and Valsalva exercise.  At her last visit in August 2023, her Mnire's symptoms were under control.  Her left middle ear effusion had resolved.  The patient returns today reporting no more dizziness or eustachian tube dysfunction over the past 6 months.  She is wearing a right ear hearing aid.  Currently she denies any otalgia or otorrhea.   Objective Objective note General: Communicates without difficulty, well nourished, no acute distress. Head: Normocephalic, no evidence injury, no tenderness, facial buttresses intact without stepoff. Eyes: PERRL, EOMI. No scleral icterus, conjunctivae clear. Neuro: CN II exam reveals vision grossly intact. No nystagmus at any point of gaze. Ears: Auricles well formed without lesions. Ear canals are intact without mass or lesion. No erythema or edema is appreciated.  Both tympanic membranes are intact and mobile.  Nose: External evaluation reveals normal support and skin without lesions. Dorsum is intact. Anterior rhinoscopy reveals congested mucosa over anterior aspect of inferior turbinates and intact septum. No purulence noted. Oral:  Oral cavity and oropharynx are intact, symmetric, without erythema or edema. Mucosa is moist without lesions. Neck: Full range of motion without pain. There is no significant lymphadenopathy. No masses palpable. Thyroid  bed within normal limits to palpation. Parotid glands and submandibular glands equal bilaterally without  mass. Trachea is midline. Neuro:  CN 2-12 grossly intact. Gait normal. Vestibular: No nystagmus at any point of gaze. The cerebellar examination is unremarkable. Vestibular: There is no nystagmus with pneumatic pressure on either tympanic membrane or Valsalva.     AUDIOMETRIC TESTING: I have read and reviewed the audiometric test, which shows asymmetric right ear low-frequency sensorineural hearing loss. The speech reception threshold is 40dB AD and 15dB AS. The discrimination score is 24% AD and 100% AS. The tympanogram is normal bilaterally.    Observations Functional status No functional status recorded  Cognitive status No cognitive status recorded  Assessment Assessment note 1.  The patient's right ear Mnire's disease is currently under controlled with a low-salt diet.  2.  Stable asymmetric right ear low-frequency sensorineural hearing loss.  3.  The patient's ear canals, tympanic membranes, and middle ear spaces are noted to be normal.  No middle ear effusion is noted today.   Screenings/Interventions/Assessments No screenings/interventions/assessments recorded  Diagnoses attached to encounter No diagnoses attached  Plan Plan note 1.  The physical exam findings and the hearing test results are reviewed with the patient.  2.  Continue with her 1500 mg low-salt diet.  3.  Continue the use of her right ear hearing aid.  4.  The patient will return for reevaluation in 1 year.

## 2024-02-13 DIAGNOSIS — H9041 Sensorineural hearing loss, unilateral, right ear, 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: Secondary | ICD-10-CM | POA: Insufficient documentation

## 2024-02-13 DIAGNOSIS — H6983 Other specified disorders of Eustachian tube, bilateral: Secondary | ICD-10-CM | POA: Insufficient documentation

## 2024-02-14 ENCOUNTER — Telehealth: Payer: Self-pay | Admitting: *Deleted

## 2024-02-14 ENCOUNTER — Other Ambulatory Visit: Payer: Self-pay | Admitting: Internal Medicine

## 2024-02-14 DIAGNOSIS — F339 Major depressive disorder, recurrent, unspecified: Secondary | ICD-10-CM

## 2024-02-14 NOTE — Telephone Encounter (Signed)
 LVM to confirm appt for 5/12 - included address and time - [facility] 02/14/24

## 2024-02-18 ENCOUNTER — Ambulatory Visit (INDEPENDENT_AMBULATORY_CARE_PROVIDER_SITE_OTHER): Admitting: Audiology

## 2024-02-18 ENCOUNTER — Other Ambulatory Visit: Payer: Self-pay | Admitting: Internal Medicine

## 2024-02-18 DIAGNOSIS — M8000XA Age-related osteoporosis with current pathological fracture, unspecified site, initial encounter for fracture: Secondary | ICD-10-CM

## 2024-02-29 ENCOUNTER — Other Ambulatory Visit: Payer: Self-pay | Admitting: Internal Medicine

## 2024-02-29 DIAGNOSIS — E119 Type 2 diabetes mellitus without complications: Secondary | ICD-10-CM

## 2024-02-29 DIAGNOSIS — E782 Mixed hyperlipidemia: Secondary | ICD-10-CM

## 2024-02-29 DIAGNOSIS — N1831 Chronic kidney disease, stage 3a: Secondary | ICD-10-CM

## 2024-03-09 DIAGNOSIS — E119 Type 2 diabetes mellitus without complications: Secondary | ICD-10-CM | POA: Diagnosis not present

## 2024-03-10 ENCOUNTER — Encounter: Payer: Self-pay | Admitting: Internal Medicine

## 2024-03-10 ENCOUNTER — Ambulatory Visit: Admitting: Internal Medicine

## 2024-03-10 VITALS — BP 150/81 | HR 92 | Ht 61.0 in | Wt 164.8 lb

## 2024-03-10 DIAGNOSIS — E1122 Type 2 diabetes mellitus with diabetic chronic kidney disease: Secondary | ICD-10-CM | POA: Diagnosis not present

## 2024-03-10 DIAGNOSIS — M810 Age-related osteoporosis without current pathological fracture: Secondary | ICD-10-CM | POA: Diagnosis not present

## 2024-03-10 DIAGNOSIS — N1831 Chronic kidney disease, stage 3a: Secondary | ICD-10-CM | POA: Diagnosis not present

## 2024-03-10 DIAGNOSIS — I10 Essential (primary) hypertension: Secondary | ICD-10-CM | POA: Diagnosis not present

## 2024-03-10 DIAGNOSIS — E559 Vitamin D deficiency, unspecified: Secondary | ICD-10-CM | POA: Diagnosis not present

## 2024-03-10 DIAGNOSIS — M8000XA Age-related osteoporosis with current pathological fracture, unspecified site, initial encounter for fracture: Secondary | ICD-10-CM

## 2024-03-10 DIAGNOSIS — E782 Mixed hyperlipidemia: Secondary | ICD-10-CM

## 2024-03-10 DIAGNOSIS — Z6833 Body mass index (BMI) 33.0-33.9, adult: Secondary | ICD-10-CM | POA: Diagnosis not present

## 2024-03-10 DIAGNOSIS — G2581 Restless legs syndrome: Secondary | ICD-10-CM | POA: Insufficient documentation

## 2024-03-10 DIAGNOSIS — F3340 Major depressive disorder, recurrent, in remission, unspecified: Secondary | ICD-10-CM

## 2024-03-10 MED ORDER — ONDANSETRON HCL 4 MG PO TABS: 4.0000 mg | ORAL_TABLET | Freq: Three times a day (TID) | ORAL | 2 refills | Status: AC | PRN

## 2024-03-10 MED ORDER — LISINOPRIL 20 MG PO TABS: 20.0000 mg | ORAL_TABLET | Freq: Every day | ORAL | 3 refills | Status: AC

## 2024-03-10 MED ORDER — MECLIZINE HCL 25 MG PO TABS: 25.0000 mg | ORAL_TABLET | Freq: Three times a day (TID) | ORAL | 2 refills | Status: AC | PRN

## 2024-03-10 NOTE — Patient Instructions (Signed)
 It was a pleasure to see you today.  Thank you for giving us  the opportunity to be involved in your care.  Below is a brief recap of your visit and next steps.  We will plan to see you again in 6 months.  Summary Increase lisinopril  to 20 mg daily Repeat labs Follow up in 6 months

## 2024-03-10 NOTE — Assessment & Plan Note (Signed)
 Pramipexole  0.25 mg daily remains effective.

## 2024-03-10 NOTE — Progress Notes (Signed)
 Established Patient Office Visit  Subjective   Patient ID: Teresa Soto, female    DOB: [birth year]  Age: 68 y.o. MRN: [id number]  Chief Complaint  Patient presents with   Care Management    Three month follow up    Teresa Soto returns to care today for routine follow-up.  She was last evaluated by me on 3/6 for routine follow-up through video encounter.  No medication changes were made at that time, repeat labs ordered, and 14-month follow-up arranged.  She has been seen by ENT in the interim in the setting of right Mnire's disease with hearing loss and tinnitus.  She has also been seen by neurosurgery in the setting of lumbar radiculopathy.  There have otherwise been no acute interval events.  Today she reports feeling well and has no acute concerns to discuss aside from requesting medication refills.  Past Medical History:  Diagnosis Date   Allergy Hayfever, dust, peppers   Arthritis of knee, right 08/12/2014   Asthma    Phreesia 06/16/2020   Chronic back pain    Chronic constipation    Depression    Diabetes mellitus without complication (HCC)    Phreesia 06/16/2020   Heart murmur    History of bronchitis    History of urinary tract infection    Hyperlipidemia    Phreesia 06/16/2020   Hypertension    Meniere disease    vertigo   Neuropathic pain    Osteoporosis    Phreesia 06/16/2020   Parathyroid  adenoma    Poor fine motor skills    secondary to CVA per right side    PTSD (post-traumatic stress disorder)    Restless legs syndrome 2007 approx   Seasonal allergies    Shortness of breath dyspnea    coldness    Stroke (HCC) 2015   Past Surgical History:  Procedure Laterality Date   ANTERIOR LAT LUMBAR FUSION Right 12/26/2022   Procedure: Direct Lateral Interbody Fusion Lumbar three-four, Right  Prone  Transpsoas;  Surgeon: Van Gelinas, MD;  Location: [facility] OR;  Service: Neurosurgery;  Laterality: Right;   BACK SURGERY  2012   L1-L2  x2   CESAREAN SECTION      x 2   JOINT REPLACEMENT     LUMBAR PERCUTANEOUS PEDICLE SCREW 1 LEVEL Right 12/26/2022   Procedure: LUMBAR PERCUTANEOUS PEDICLE SCREW LUMBAR THREE-FOUR;  Surgeon: Van Gelinas, MD;  Location: [facility] OR;  Service: Neurosurgery;  Laterality: Right;   PARATHYROIDECTOMY N/A 06/27/2016   Procedure: MINIMALLY INVASIVE PARATHYROIDECTOMY;  Surgeon: Aldean Hummingbird, MD;  Location: [facility] ORS;  Service: General;  Laterality: N/A;   PARATHYROIDECTOMY Right 02/09/2022   Procedure: PARATHYROIDECTOMY;  Surgeon: Kinsinger, Alphonso Aschoff, MD;  Location: [facility] ORS;  Service: General;  Laterality: Right;   SHOULDER ARTHROSCOPY W/ ROTATOR CUFF REPAIR  2010   to right shoulder   SPINE SURGERY  12/2010   TONSILLECTOMY     TOTAL KNEE ARTHROPLASTY Right 08/12/2014   Procedure: RIGHT TOTAL KNEE ARTHROPLASTY;  Surgeon: Darrin Emerald, MD;  Location: [facility] ORS;  Service: Orthopedics;  Laterality: Right;   TUBAL LIGATION  1999   Social History   Tobacco Use   Smoking status: Former    Current packs/day: 0.00    Average packs/day: 2.0 packs/day for 6.0 years (12.0 ttl pk-yrs)    Types: Cigarettes    Start date: 10/09/1976    Quit date: 10/09/1982    Years since quitting: 41.4    Passive exposure: Past  Smokeless tobacco: Never   Tobacco comments:    quit in 1984  Vaping Use   Vaping status: Former  Substance Use Topics   Alcohol use: Yes    Alcohol/week: 2.0 standard drinks of alcohol    Types: 1 Glasses of wine, 1 Cans of beer per week   Drug use: No   Family History  Problem Relation Age of Onset   Hypertension Mother    Hyperlipidemia Mother    Alcohol abuse Mother    Arthritis Mother    Alcohol abuse Father    Dementia Paternal Grandmother    Alcohol abuse Maternal Grandfather    Hearing loss Maternal Grandfather    Heart disease Maternal Grandfather    Hypertension Maternal Grandfather    Depression Maternal Grandmother    ADD / ADHD Son    Learning disabilities Son    Alcohol abuse Sister     Arthritis Sister    Alcohol abuse Paternal Uncle    Diabetes Maternal Aunt    Obesity Maternal Aunt    Allergies  Allergen Reactions   Fluticasone      headache   Influenza Vaccines Rash   Other Nausea And Vomiting    Peppers    Pneumococcal Vaccine Rash    Had rash, given both flu and PNA at same time    Pneumococcal Vaccines Rash    Had rash, given both flu and PNA at same time    Review of Systems  Constitutional:  Negative for chills and fever.  HENT:  Negative for sore throat.   Respiratory:  Negative for cough and shortness of breath.   Cardiovascular:  Negative for chest pain, palpitations and leg swelling.  Gastrointestinal:  Negative for abdominal pain, blood in stool, constipation, diarrhea, nausea and vomiting.  Genitourinary:  Negative for dysuria and hematuria.  Musculoskeletal:  Negative for myalgias.  Skin:  Negative for itching and rash.  Neurological:  Negative for dizziness and headaches.  Psychiatric/Behavioral:  Negative for depression and suicidal ideas.      Objective:     BP (!) 150/81   Pulse 92   Ht 5\' 1"  (1.549 m)   Wt 164 lb 12.8 oz (74.8 kg)   SpO2 96%   BMI 31.14 kg/m  BP Readings from Last 3 Encounters:  03/10/24 (!) 150/81  02/12/24 (!) 144/72  07/19/23 (!) 167/92   Physical Exam Vitals reviewed.  Constitutional:      General: She is not in acute distress.    Appearance: Normal appearance. She is obese. She is not toxic-appearing.  HENT:     Head: Normocephalic and atraumatic.     Right Ear: External ear normal.     Left Ear: External ear normal.     Nose: Nose normal. No congestion or rhinorrhea.     Mouth/Throat:     Mouth: Mucous membranes are moist.     Pharynx: Oropharynx is clear. No oropharyngeal exudate or posterior oropharyngeal erythema.  Eyes:     General: No scleral icterus.    Extraocular Movements: Extraocular movements intact.     Conjunctiva/sclera: Conjunctivae normal.     Pupils: Pupils are equal, round,  and reactive to light.  Cardiovascular:     Rate and Rhythm: Normal rate and regular rhythm.     Pulses: Normal pulses.     Heart sounds: Normal heart sounds. No murmur heard.    No friction rub. No gallop.  Pulmonary:     Effort: Pulmonary effort is normal.  Breath sounds: Normal breath sounds. No wheezing, rhonchi or rales.  Abdominal:     General: Abdomen is flat. Bowel sounds are normal. There is no distension.     Palpations: Abdomen is soft.     Tenderness: There is no abdominal tenderness.  Musculoskeletal:        General: No swelling. Normal range of motion.     Cervical back: Normal range of motion.     Right lower leg: No edema.     Left lower leg: No edema.  Lymphadenopathy:     Cervical: No cervical adenopathy.  Skin:    General: Skin is warm and dry.     Capillary Refill: Capillary refill takes less than 2 seconds.     Coloration: Skin is not jaundiced.  Neurological:     General: No focal deficit present.     Mental Status: She is alert and oriented to person, place, and time.     Gait: Gait abnormal (Ambulates with a cane).  Psychiatric:        Mood and Affect: Mood normal.        Behavior: Behavior normal.   Last CBC Lab Results  Component Value Date   WBC 6.8 12/22/2022   HGB 14.7 12/22/2022   HCT 44.6 12/22/2022   MCV 86.3 12/22/2022   MCH 28.4 12/22/2022   RDW 13.5 12/22/2022   PLT 329 12/22/2022   Last metabolic panel Lab Results  Component Value Date   GLUCOSE 230 (H) 12/22/2022   NA 136 12/22/2022   K 3.7 12/22/2022   CL 102 12/22/2022   CO2 24 12/22/2022   BUN 23 12/22/2022   CREATININE 1.00 12/22/2022   GFRNONAA >60 12/22/2022   CALCIUM  10.0 12/22/2022   PHOS 3.6 03/29/2022   PROT 7.4 11/23/2022   ALBUMIN 4.8 11/23/2022   LABGLOB 2.6 11/23/2022   AGRATIO 1.8 11/23/2022   BILITOT 0.3 11/23/2022   ALKPHOS 40 (L) 11/23/2022   AST 14 11/23/2022   ALT 13 11/23/2022   ANIONGAP 10 12/22/2022   Last lipids Lab Results  Component  Value Date   CHOL 203 (H) 11/23/2022   HDL 45 11/23/2022   LDLCALC 128 (H) 11/23/2022   TRIG 170 (H) 11/23/2022   CHOLHDL 4.5 (H) 11/23/2022   Last hemoglobin A1c Lab Results  Component Value Date   HGBA1C 7.6 (H) 11/23/2022   Last thyroid  functions Lab Results  Component Value Date   TSH 0.809 11/23/2022   Last vitamin D  Lab Results  Component Value Date   VD25OH 46.6 11/23/2022   Last vitamin B12 and Folate Lab Results  Component Value Date   VITAMINB12 862 11/23/2022   FOLATE 13.7 11/23/2022     Assessment & Plan:   Problem List Items Addressed This Visit       Essential hypertension - Primary   She is currently prescribed lisinopril  10 mg daily.  BP remains mildly elevated today.  Will increase lisinopril  to 20 mg daily.      Type 2 diabetes mellitus with stage 3a chronic kidney disease, without long-term current use of insulin  (HCC)   A1c 7.6 on labs from February 2024.  She remains on Jardiance  25 mg daily.  Repeat A1c and urine microalbumin/creatinine ratio are pending.      Osteoporosis with current pathological fracture   Followed by endocrinology.  Previously treated with Fosamax .  Repeat DEXA is pending.      Mixed hyperlipidemia   Lipid panel last updated in February 2024.  Total  cholesterol 203 and LDL 128.  She is currently prescribed atorvastatin  40 mg daily and fenofibrate .  Repeat lipid panel ordered today.      Major depression   Mood remains stable with Lexapro .      Vitamin D  deficiency   Noted on previous labs.  Repeat vitamin D  level pending.      RLS (restless legs syndrome)   Pramipexole  0.25 mg daily remains effective.      Return in about 6 months (around 09/09/2024).   Tobi Fortes, MD

## 2024-03-10 NOTE — Assessment & Plan Note (Signed)
 Noted on previous labs.  Repeat vitamin D  level pending.

## 2024-03-10 NOTE — Assessment & Plan Note (Signed)
 Lipid panel last updated in February 2024.  Total cholesterol 203 and LDL 128.  She is currently prescribed atorvastatin  40 mg daily and fenofibrate .  Repeat lipid panel ordered today.

## 2024-03-10 NOTE — Assessment & Plan Note (Signed)
 Followed by endocrinology.  Previously treated with Fosamax .  Repeat DEXA is pending.

## 2024-03-10 NOTE — Assessment & Plan Note (Signed)
 Mood remains stable with Lexapro 

## 2024-03-10 NOTE — Assessment & Plan Note (Signed)
>>  ASSESSMENT AND PLAN FOR MAJOR DEPRESSION WRITTEN ON 03/10/2024 12:15 PM BY DIXON, PHILLIP E, MD  Mood remains stable with Lexapro .

## 2024-03-10 NOTE — Assessment & Plan Note (Signed)
 She is currently prescribed lisinopril  10 mg daily.  BP remains mildly elevated today.  Will increase lisinopril  to 20 mg daily.

## 2024-03-10 NOTE — Assessment & Plan Note (Signed)
 A1c 7.6 on labs from February 2024.  She remains on Jardiance  25 mg daily.  Repeat A1c and urine microalbumin/creatinine ratio are pending.

## 2024-03-11 LAB — LIPID PANEL

## 2024-03-12 ENCOUNTER — Ambulatory Visit: Payer: Self-pay | Admitting: Internal Medicine

## 2024-03-13 ENCOUNTER — Ambulatory Visit: Admitting: Internal Medicine

## 2024-03-14 LAB — MICROALBUMIN / CREATININE URINE RATIO

## 2024-03-14 LAB — CMP14+EGFR
ALT: 23 IU/L (ref 0–32)
AST: 23 IU/L (ref 0–40)
Albumin: 4.1 g/dL (ref 3.9–4.9)
Alkaline Phosphatase: 53 IU/L (ref 44–121)
BUN/Creatinine Ratio: 23 (ref 12–28)
BUN: 25 mg/dL (ref 8–27)
Bilirubin Total: 0.2 mg/dL (ref 0.0–1.2)
CO2: 20 mmol/L (ref 20–29)
Calcium: 9.9 mg/dL (ref 8.7–10.3)
Chloride: 103 mmol/L (ref 96–106)
Creatinine, Ser: 1.08 mg/dL — ABNORMAL HIGH (ref 0.57–1.00)
Globulin, Total: 2.6 g/dL (ref 1.5–4.5)
Glucose: 239 mg/dL — ABNORMAL HIGH (ref 70–99)
Potassium: 4.2 mmol/L (ref 3.5–5.2)
Sodium: 140 mmol/L (ref 134–144)
Total Protein: 6.7 g/dL (ref 6.0–8.5)
eGFR: 57 mL/min/{1.73_m2} — ABNORMAL LOW (ref >59)

## 2024-03-14 LAB — CBC WITH DIFFERENTIAL/PLATELET
Basophils Absolute: 0 10*3/uL (ref 0.0–0.2)
Basos: 1 %
EOS (ABSOLUTE): 0.2 10*3/uL (ref 0.0–0.4)
Eos: 3 %
Hematocrit: 43.8 % (ref 34.0–46.6)
Hemoglobin: 14 g/dL (ref 11.1–15.9)
Immature Grans (Abs): 0 10*3/uL (ref 0.0–0.1)
Immature Granulocytes: 0 %
Lymphocytes Absolute: 1.9 10*3/uL (ref 0.7–3.1)
Lymphs: 33 %
MCH: 28.5 pg (ref 26.6–33.0)
MCHC: 32 g/dL (ref 31.5–35.7)
MCV: 89 fL (ref 79–97)
Monocytes Absolute: 0.3 10*3/uL (ref 0.1–0.9)
Monocytes: 5 %
Neutrophils Absolute: 3.4 10*3/uL (ref 1.4–7.0)
Neutrophils: 58 %
Platelets: 310 10*3/uL (ref 150–450)
RBC: 4.92 x10E6/uL (ref 3.77–5.28)
RDW: 13.5 % (ref 11.7–15.4)
WBC: 5.8 10*3/uL (ref 3.4–10.8)

## 2024-03-14 LAB — VITAMIN D 25 HYDROXY (VIT D DEFICIENCY, FRACTURES): Vit D, 25-Hydroxy: 59 ng/mL (ref 30.0–100.0)

## 2024-03-14 LAB — TSH+FREE T4
Free T4: 1.19 ng/dL (ref 0.82–1.77)
TSH: 0.531 u[IU]/mL (ref 0.450–4.500)

## 2024-03-14 LAB — LIPID PANEL
Cholesterol, Total: 147 mg/dL (ref 100–199)
HDL: 44 mg/dL (ref >39)
LDL CALC COMMENT:: 3.3 ratio (ref 0.0–4.4)
LDL Chol Calc (NIH): 81 mg/dL (ref 0–99)
Triglycerides: 125 mg/dL (ref 0–149)
VLDL Cholesterol Cal: 22 mg/dL (ref 5–40)

## 2024-03-14 LAB — HEMOGLOBIN A1C
Est. average glucose Bld gHb Est-mCnc: 174 mg/dL
Hgb A1c MFr Bld: 7.7 % — ABNORMAL HIGH (ref 4.8–5.6)

## 2024-03-14 LAB — B12 AND FOLATE PANEL
Folate: 8.2 ng/mL (ref >3.0)
Vitamin B-12: 697 pg/mL (ref 232–1245)

## 2024-03-17 ENCOUNTER — Ambulatory Visit (INDEPENDENT_AMBULATORY_CARE_PROVIDER_SITE_OTHER): Payer: Self-pay | Admitting: Audiology

## 2024-03-17 DIAGNOSIS — H905 Unspecified sensorineural hearing loss: Secondary | ICD-10-CM

## 2024-03-17 NOTE — Progress Notes (Unsigned)
  [address], [address] [place], [place] [address] [phone]  Hearing Aid Check     Teresa Soto comes for a scheduled appointment for a hearing aid check.  Patient has a right Phonak receiver in the canal hearing aid with a custom skeleton cshell. It was purchased outside form this clinic or Dr. [facility] clinic.    Chief complaint: Patient reports right cshell broke.  Actions taken: Patientw as given the option to try to glue it in office or she could request a new one for an average of a $150 fee.  Patient opted to glue it. The aid and the cshell were cleaned. Wax filter changed.  Services fee: $30 was paid at checkout.   Recommend: Return for a hearing aid check , as needed. Return for a hearing evaluation and to see an ENT, if concerns with hearing changes arise.    Teren Zurcher MARIE LEROUX-MARTINEZ, AUD

## 2024-04-05 ENCOUNTER — Other Ambulatory Visit: Payer: Self-pay | Admitting: Internal Medicine

## 2024-04-05 DIAGNOSIS — E782 Mixed hyperlipidemia: Secondary | ICD-10-CM

## 2024-04-05 DIAGNOSIS — M8000XA Age-related osteoporosis with current pathological fracture, unspecified site, initial encounter for fracture: Secondary | ICD-10-CM

## 2024-04-06 ENCOUNTER — Other Ambulatory Visit: Payer: Self-pay | Admitting: Internal Medicine

## 2024-04-06 DIAGNOSIS — I10 Essential (primary) hypertension: Secondary | ICD-10-CM

## 2024-04-06 DIAGNOSIS — E782 Mixed hyperlipidemia: Secondary | ICD-10-CM

## 2024-04-08 DIAGNOSIS — E119 Type 2 diabetes mellitus without complications: Secondary | ICD-10-CM | POA: Diagnosis not present

## 2024-04-12 ENCOUNTER — Other Ambulatory Visit: Payer: Self-pay | Admitting: Internal Medicine

## 2024-04-22 ENCOUNTER — Other Ambulatory Visit: Payer: Self-pay | Admitting: Internal Medicine

## 2024-04-22 DIAGNOSIS — G8929 Other chronic pain: Secondary | ICD-10-CM

## 2024-05-07 ENCOUNTER — Other Ambulatory Visit: Payer: Self-pay | Admitting: Internal Medicine

## 2024-05-09 DIAGNOSIS — E119 Type 2 diabetes mellitus without complications: Secondary | ICD-10-CM | POA: Diagnosis not present

## 2024-05-13 ENCOUNTER — Ambulatory Visit: Payer: Medicare Other

## 2024-05-13 VITALS — Ht 61.0 in | Wt 164.0 lb

## 2024-05-13 DIAGNOSIS — Z0000 Encounter for general adult medical examination without abnormal findings: Secondary | ICD-10-CM | POA: Diagnosis not present

## 2024-05-13 DIAGNOSIS — Z1231 Encounter for screening mammogram for malignant neoplasm of breast: Secondary | ICD-10-CM

## 2024-05-13 NOTE — Progress Notes (Signed)
 Subjective:   Teresa Soto is a 68 y.o. who presents for a Medicare Wellness preventive visit.  As a reminder, Annual Wellness Visits don't include a physical exam, and some assessments may be limited, especially if this visit is performed virtually. We may recommend an in-person follow-up visit with your provider if needed.  Visit Complete: Virtual I connected with  Teresa Soto on 05/13/24 by a audio enabled telemedicine application and verified that I am speaking with the correct person using two identifiers.  Patient Location: Home  Provider Location: Home Office  I discussed the limitations of evaluation and management by telemedicine. The patient expressed understanding and agreed to proceed.  Vital Signs: Because this visit was a virtual/telehealth visit, some criteria may be missing or patient reported. Any vitals not documented were not able to be obtained and vitals that have been documented are patient reported.  VideoDeclined- This patient declined Librarian, academic. Therefore the visit was completed with audio only.  Persons Participating in Visit: Patient.  AWV Questionnaire: No: Patient Medicare AWV questionnaire was not completed prior to this visit.  Cardiac Risk Factors include: advanced age (>41men, >14 women);diabetes mellitus;dyslipidemia;hypertension;obesity (BMI >30kg/m2)     Objective:    Today's Vitals   05/13/24 0842  Weight: 164 lb (74.4 kg)  Height: 5' 1 (1.549 m)   Body mass index is 30.99 kg/m.     05/13/2024    8:41 AM 05/09/2023   10:30 AM 12/26/2022    5:55 AM 12/22/2022    9:27 AM 11/28/2022   11:05 AM 06/21/2022    2:33 PM 02/09/2022   12:29 PM  Advanced Directives  Does Patient Have a Medical Advance Directive? No No No No No No   Would patient like information on creating a medical advance directive? No - Patient declined No - Patient declined  No - Patient declined No - Patient declined No - Patient  declined No - Patient declined    Current Medications (verified) Outpatient Encounter Medications as of 05/13/2024  Medication Sig   albuterol  (VENTOLIN  HFA) 108 (90 Base) MCG/ACT inhaler Inhale 2 puffs into the lungs every 6 (six) hours as needed for wheezing or shortness of breath.   aspirin  EC 81 MG tablet Take 1 tablet (81 mg total) by mouth daily.   atorvastatin  (LIPITOR) 40 MG tablet Take 1 tablet by mouth once daily   Blood Glucose Monitoring Suppl (BLOOD GLUCOSE SYSTEM PAK) KIT Please dispense based on patient and insurance preference. Use as directed to monitor FSBS 1x daily. Dx: E11.9.   Cholecalciferol  125 MCG (5000 UT) capsule Take 5,000 Units by mouth daily.   escitalopram  (LEXAPRO ) 20 MG tablet Take 1 tablet by mouth once daily   fenofibrate  (TRICOR ) 145 MG tablet Take 1 tablet by mouth once daily   ferrous sulfate  325 (65 FE) MG EC tablet Take 325 mg by mouth every morning.   gabapentin  (NEURONTIN ) 300 MG capsule Take 1 capsule by mouth twice daily   JARDIANCE  25 MG TABS tablet TAKE 1 TABLET BY MOUTH ONCE DAILY BEFORE BREAKFAST   ketoconazole  (NIZORAL ) 2 % shampoo Apply 1 application topically 2 (two) times a week.   Lancet Devices MISC Please dispense based on patient and insurance preference. Use as directed to monitor FSBS 1x daily. Dx: E11.9.   Lancets MISC Please dispense based on patient and insurance preference. Use as directed to monitor FSBS 1x daily. Dx: E11.9.   lisinopril  (ZESTRIL ) 20 MG tablet Take 1 tablet (20  mg total) by mouth daily.   meclizine  (ANTIVERT ) 25 MG tablet Take 1 tablet (25 mg total) by mouth 3 (three) times daily as needed for dizziness.   montelukast  (SINGULAIR ) 10 MG tablet TAKE 1 TABLET BY MOUTH ONCE DAILY IN THE EVENING   ondansetron  (ZOFRAN ) 4 MG tablet Take 1 tablet (4 mg total) by mouth every 8 (eight) hours as needed for vomiting or nausea.   polyethylene glycol powder (GLYCOLAX /MIRALAX ) 17 GM/SCOOP powder Take 17 g by mouth every other day.    potassium chloride  SA (KLOR-CON  M) 20 MEQ tablet Take 1 tablet by mouth daily.   pramipexole  (MIRAPEX ) 0.25 MG tablet Take 1 tablet by mouth once daily with breakfast   No facility-administered encounter medications on file as of 05/13/2024.    Allergies (verified) Fluticasone , Influenza vaccines, Other, Pneumococcal vaccine, and Pneumococcal vaccines   History: Past Medical History:  Diagnosis Date   Allergy Hayfever, dust, peppers   Arthritis of knee, right 08/12/2014   Asthma    [employer]reesia 06/16/2020   Chronic back pain    Chronic constipation    Depression    Diabetes mellitus without complication (HCC)    [employer]reesia 06/16/2020   Heart murmur    History of bronchitis    History of urinary tract infection    Hyperlipidemia    [employer]reesia 06/16/2020   Hypertension    Meniere disease    vertigo   Neuropathic pain    Osteoporosis    [employer]reesia 06/16/2020   Parathyroid  adenoma    Poor fine motor skills    secondary to CVA per right side    PTSD (post-traumatic stress disorder)    Restless legs syndrome 2007 approx   Seasonal allergies    Shortness of breath dyspnea    coldness    Stroke (HCC) 2015   Past Surgical History:  Procedure Laterality Date   ANTERIOR LAT LUMBAR FUSION Right 12/26/2022   Procedure: Direct Lateral Interbody Fusion Lumbar three-four, Right  Prone  Transpsoas;  Surgeon: Debby Dorn MATSU, MD;  Location: [facility] OR;  Service: Neurosurgery;  Laterality: Right;   BACK SURGERY  2012   L1-L2  x2   CESAREAN SECTION     x 2   JOINT REPLACEMENT     LUMBAR PERCUTANEOUS PEDICLE SCREW 1 LEVEL Right 12/26/2022   Procedure: LUMBAR PERCUTANEOUS PEDICLE SCREW LUMBAR THREE-FOUR;  Surgeon: Debby Dorn MATSU, MD;  Location: [facility] OR;  Service: Neurosurgery;  Laterality: Right;   PARATHYROIDECTOMY N/A 06/27/2016   Procedure: MINIMALLY INVASIVE PARATHYROIDECTOMY;  Surgeon: Camellia Blush, MD;  Location: [facility] ORS;  Service: General;  Laterality: N/A;   PARATHYROIDECTOMY Right  02/09/2022   Procedure: PARATHYROIDECTOMY;  Surgeon: Kinsinger, Herlene Righter, MD;  Location: [facility] ORS;  Service: General;  Laterality: Right;   SHOULDER ARTHROSCOPY W/ ROTATOR CUFF REPAIR  2010   to right shoulder   SPINE SURGERY  12/2010   TONSILLECTOMY     TOTAL KNEE ARTHROPLASTY Right 08/12/2014   Procedure: RIGHT TOTAL KNEE ARTHROPLASTY;  Surgeon: Taft FORBES Minerva, MD;  Location: [facility] ORS;  Service: Orthopedics;  Laterality: Right;   TUBAL LIGATION  1999   Family History  Problem Relation Age of Onset   Hypertension Mother    Hyperlipidemia Mother    Alcohol abuse Mother    Arthritis Mother    Alcohol abuse Father    Dementia Paternal Grandmother    Alcohol abuse Maternal Grandfather    Hearing loss Maternal Grandfather    Heart disease Maternal Grandfather    Hypertension  Maternal Grandfather    Depression Maternal Grandmother    ADD / ADHD Son    Learning disabilities Son    Alcohol abuse Sister    Arthritis Sister    Alcohol abuse Paternal Uncle    Diabetes Maternal Aunt    Obesity Maternal Aunt    Social History   Socioeconomic History   Marital status: Married    Spouse name: Cylah Fannin   Number of children: Not on file   Years of education: 12   Highest education level: 12th grade  Occupational History   Not on file  Tobacco Use   Smoking status: Former    Current packs/day: 0.00    Average packs/day: 2.0 packs/day for 6.0 years (12.0 ttl pk-yrs)    Types: Cigarettes    Start date: 10/09/1976    Quit date: 10/09/1982    Years since quitting: 41.6    Passive exposure: Past   Smokeless tobacco: Never   Tobacco comments:    quit in 1984  Vaping Use   Vaping status: Former  Substance and Sexual Activity   Alcohol use: Yes    Alcohol/week: 2.0 standard drinks of alcohol    Types: 1 Glasses of wine, 1 Cans of beer per week   Drug use: No   Sexual activity: Yes    Partners: Male    Birth control/protection: Post-menopausal, Surgical  Other Topics  Concern   Not on file  Social History Narrative   Not on file   Social Drivers of Health   Financial Resource Strain: Low Risk  (05/13/2024)   Overall Financial Resource Strain (CARDIA)    Difficulty of Paying Living Expenses: Not hard at all  Food Insecurity: No Food Insecurity (05/13/2024)   Hunger Vital Sign    Worried About Running Out of Food in the Last Year: Never true    Ran Out of Food in the Last Year: Never true  Transportation Needs: No Transportation Needs (05/13/2024)   PRAPARE - Administrator, Civil Service (Medical): No    Lack of Transportation (Non-Medical): No  Physical Activity: Insufficiently Active (05/13/2024)   Exercise Vital Sign    Days of Exercise per Week: 7 days    Minutes of Exercise per Session: 20 min  Stress: No Stress Concern Present (05/13/2024)   [employer] of Occupational Health - Occupational Stress Questionnaire    Feeling of Stress: Not at all  Social Connections: Socially Integrated (05/13/2024)   Social Connection and Isolation Panel    Frequency of Communication with Friends and Family: More than three times a week    Frequency of Social Gatherings with Friends and Family: More than three times a week    Attends Religious Services: More than 4 times per year    Active Member of [employer] or Organizations: Yes    Attends Engineer, structural: More than 4 times per year    Marital Status: Married    Tobacco Counseling Counseling given: Yes Tobacco comments: quit in 1984    Clinical Intake:  Pre-visit preparation completed: Yes  Pain : No/denies pain     BMI - recorded: 30.99 Nutritional Status: BMI > 30  Obese Diabetes: Yes CBG done?: No Did pt. bring in CBG monitor from home?: No  Lab Results  Component Value Date   HGBA1C 7.7 (H) 03/10/2024   HGBA1C 7.6 (H) 11/23/2022   HGBA1C 6.7 (H) 02/09/2022     How often do you need to have someone help you when  you read instructions, pamphlets, or other written  materials from your doctor or pharmacy?: 1 - Never  Interpreter Needed?: No  Information entered by :: [facility]   Activities of Daily Living     05/13/2024    9:06 AM  In your present state of health, do you have any difficulty performing the following activities:  Hearing? 0  Vision? 0  Difficulty concentrating or making decisions? 0  Walking or climbing stairs? 0  Dressing or bathing? 0  Doing errands, shopping? 0  Preparing Food and eating ? N  Using the Toilet? N  In the past six months, have you accidently leaked urine? N  Do you have problems with loss of bowel control? N  Managing your Medications? N  Managing your Finances? N  Housekeeping or managing your Housekeeping? N    Patient Care Team: Bevely Doffing, FNP as PCP - General (Family Medicine) Ladora Ross Lacy Phebe, MD as Referring Physician (Optometry) Margrette Taft BRAVO, MD as Consulting Physician (Orthopedic Surgery) Karis Clunes, MD as Consulting Physician (Otolaryngology) Sherrilee Belvie CROME, MD as Consulting Physician (Urology) Lenis Ethelle ORN, MD as Consulting Physician (Endocrinology) Kennedy Charmaine CROME, NP as Nurse Practitioner (Gastroenterology)  I have updated your Care Teams any recent Medical Services you may have received from other providers in the past year.     Assessment:   This is a routine wellness examination for Teresa Soto.  Hearing/Vision screen Hearing Screening - Comments:: Patient denies any hearing difficulties.   Vision Screening - Comments:: Wears rx glasses - up to date with routine eye exams with  Dr. Ladora Corrie Dixon   Goals Addressed             This Visit's Progress    Patient Stated       I'd like to get my library in order.        Depression Screen     05/13/2024    9:10 AM 03/10/2024   11:10 AM 05/09/2023   10:45 AM 03/01/2023    9:58 AM 01/18/2023   10:53 AM 12/21/2022   10:41 AM 11/23/2022   10:20 AM  PHQ 2/9 Scores  PHQ - 2 Score 0 0 0 0 0 0 0  PHQ- 9  Score 0 5  2 2 6 3     Fall Risk     05/13/2024    8:59 AM 03/10/2024   11:10 AM 05/09/2023   10:48 AM 03/01/2023    9:58 AM 01/18/2023   10:53 AM  Fall Risk   Falls in the past year? 0 1 0 0 0  Number falls in past yr: 0 1 0 0 0  Injury with Fall? 0 0 0 0 0  Risk for fall due to : No Fall Risks History of fall(s) No Fall Risks No Fall Risks   Follow up Falls evaluation completed;Education provided;Falls prevention discussed Falls evaluation completed;Education provided;Falls prevention discussed Falls prevention discussed Falls evaluation completed     MEDICARE RISK AT HOME:  Medicare Risk at Home Any stairs in or around the home?: Yes If so, are there any without handrails?: No Home free of loose throw rugs in walkways, pet beds, electrical cords, etc?: Yes Adequate lighting in your home to reduce risk of falls?: Yes Life alert?: No Use of a cane, walker or w/c?: No Grab bars in the bathroom?: Yes Shower chair or bench in shower?: Yes Elevated toilet seat or a handicapped toilet?: Yes  TIMED UP AND GO:  Was the  test performed?  No  Cognitive Function: 6CIT completed        05/13/2024    9:07 AM 05/09/2023   10:38 AM  6CIT Screen  What Year? 0 points 0 points  What month? 0 points 0 points  What time? 0 points 0 points  Count back from 20 0 points 0 points  Months in reverse 0 points 0 points  Repeat phrase 0 points 0 points  Total Score 0 points 0 points    Immunizations Immunization History  Administered Date(s) Administered   Influenza Whole 06/28/2010   PFIZER(Purple Top)SARS-COV-2 Vaccination 03/09/2020, 04/08/2020   Td 03/30/2010   Tdap 01/02/2013    Screening Tests Health Maintenance  Topic Date Due   Zoster Vaccines- Shingrix (1 of 2) Never done   OPHTHALMOLOGY EXAM  07/23/2021   MAMMOGRAM  11/14/2022   DTaP/Tdap/Td (3 - Td or Tdap) 01/03/2023   COVID-19 Vaccine (3 - 2024-25 season) 06/10/2023   FOOT EXAM  02/29/2024   HEMOGLOBIN A1C  09/09/2024    DEXA SCAN  11/08/2024   Fecal DNA (Cologuard)  01/18/2025   Diabetic kidney evaluation - eGFR measurement  03/10/2025   Diabetic kidney evaluation - Urine ACR  03/10/2025   Medicare Annual Wellness (AWV)  05/13/2025   Hepatitis C Screening  Completed   Hepatitis B Vaccines  Aged Out   HPV VACCINES  Aged Out   Meningococcal B Vaccine  Aged Out   Pneumococcal Vaccine: 50+ Years  Discontinued    Health Maintenance  Health Maintenance Due  Topic Date Due   Zoster Vaccines- Shingrix (1 of 2) Never done   OPHTHALMOLOGY EXAM  07/23/2021   MAMMOGRAM  11/14/2022   DTaP/Tdap/Td (3 - Td or Tdap) 01/03/2023   COVID-19 Vaccine (3 - 2024-25 season) 06/10/2023   FOOT EXAM  02/29/2024   Health Maintenance Items Addressed: Mammogram scheduled  Additional Screening:  Vision Screening: Recommended annual ophthalmology exams for early detection of glaucoma and other disorders of the eye. Would you like a referral to an eye doctor? No    Dental Screening: Recommended annual dental exams for proper oral hygiene  Community Resource Referral / Chronic Care Management: CRR required this visit?  No   CCM required this visit?  No   Plan:    I have personally reviewed and noted the following in the patient's chart:   Medical and social history Use of alcohol, tobacco or illicit drugs  Current medications and supplements including opioid prescriptions. Patient is not currently taking opioid prescriptions. Functional ability and status Nutritional status Physical activity Advanced directives List of other physicians Hospitalizations, surgeries, and ER visits in previous 12 months Vitals Screenings to include cognitive, depression, and falls Referrals and appointments  In addition, I have reviewed and discussed with patient certain preventive protocols, quality metrics, and best practice recommendations. A written personalized care plan for preventive services as well as general preventive  health recommendations were provided to patient.   Keiandre Cygan, CMA   05/13/2024   After Visit Summary: (MyChart) Due to this being a telephonic visit, the after visit summary with patients personalized plan was offered to patient via MyChart   Notes: Nothing significant to report at this time.

## 2024-05-13 NOTE — Patient Instructions (Signed)
 Teresa Soto , Thank you for taking time out of your busy schedule to complete your Annual Wellness Visit with me. I enjoyed our conversation and look forward to speaking with you again next year. I, as well as your care team,  appreciate your ongoing commitment to your health goals. Please review the following plan we discussed and let me know if I can assist you in the future. Your Game plan/ To Do List    Referrals: If you haven't heard from the office you've been referred to, please reach out to them at the phone provided.  Mammogram: [name] Phone: [phone]   Follow up Visits: We will see or speak with you next year for your Next Medicare AWV with our clinical staff  Clinician Recommendations:  Aim for 30 minutes of exercise or brisk walking, 6-8 glasses of water, and 5 servings of fruits and vegetables each day.    Wishing you many blessings and good health during the next year until our next visit.  -Byrl Latin   This is a list of the screenings recommended for you:  Health Maintenance  Topic Date Due   Zoster (Shingles) Vaccine (1 of 2) Never done   Eye exam for diabetics  07/23/2021   Mammogram  11/14/2022   DTaP/Tdap/Td vaccine (3 - Td or Tdap) 01/03/2023   COVID-19 Vaccine (3 - 2024-25 season) 06/10/2023   Complete foot exam   02/29/2024   Hemoglobin A1C  09/09/2024   DEXA scan (bone density measurement)  11/08/2024   Cologuard (Stool DNA test)  01/18/2025   Yearly kidney function blood test for diabetes  03/10/2025   Yearly kidney health urinalysis for diabetes  03/10/2025   Medicare Annual Wellness Visit  05/13/2025   Hepatitis C Screening  Completed   Hepatitis B Vaccine  Aged Out   HPV Vaccine  Aged Out   Meningitis B Vaccine  Aged Out   Pneumococcal Vaccine for age over 55  Discontinued    Advanced directives: (Declined) Advance directive discussed with you today. Even though you declined this today, please call our office should you change your  mind, and we can give you the proper paperwork for you to fill out. Advance Care Planning is important because it:  [x]  Makes sure you receive the medical care that is consistent with your values, goals, and preferences  [x]  It provides guidance to your family and loved ones and reduces their decisional burden about whether or not they are making the right decisions based on your wishes.  Follow the link provided in your after visit summary or read over the paperwork we have mailed to you to help you started getting your Advance Directives in place. If you need assistance in completing these, please reach out to us  so that we can help you!  See attachments for Preventive Care and Fall Prevention Tips.

## 2024-05-17 ENCOUNTER — Other Ambulatory Visit: Payer: Self-pay | Admitting: Internal Medicine

## 2024-05-17 DIAGNOSIS — F339 Major depressive disorder, recurrent, unspecified: Secondary | ICD-10-CM

## 2024-05-25 ENCOUNTER — Other Ambulatory Visit: Payer: Self-pay | Admitting: Internal Medicine

## 2024-05-25 DIAGNOSIS — N1831 Chronic kidney disease, stage 3a: Secondary | ICD-10-CM

## 2024-05-25 DIAGNOSIS — E119 Type 2 diabetes mellitus without complications: Secondary | ICD-10-CM

## 2024-05-28 ENCOUNTER — Encounter (HOSPITAL_COMMUNITY): Payer: Self-pay

## 2024-05-28 ENCOUNTER — Ambulatory Visit (HOSPITAL_COMMUNITY)
Admission: RE | Admit: 2024-05-28 | Discharge: 2024-05-28 | Disposition: A | Discharge status: Home / Self Care | Source: Ambulatory Visit

## 2024-05-28 DIAGNOSIS — Z1231 Encounter for screening mammogram for malignant neoplasm of breast: Secondary | ICD-10-CM | POA: Insufficient documentation

## 2024-06-01 ENCOUNTER — Ambulatory Visit: Payer: Self-pay

## 2024-06-09 DIAGNOSIS — E119 Type 2 diabetes mellitus without complications: Secondary | ICD-10-CM | POA: Diagnosis not present

## 2024-07-01 NOTE — Progress Notes (Signed)
 Teresa Soto                                          MRN: [id number]   07/01/2024   The [facility] Quality Team Specialist reviewed this patient medical record for the purposes of chart review for care gap closure. The following were reviewed: chart review for care gap closure-controlling blood pressure.  Blood pressure was noncompliant.    [facility]BCI Quality Team

## 2024-07-02 ENCOUNTER — Other Ambulatory Visit: Payer: Self-pay | Admitting: Internal Medicine

## 2024-07-02 DIAGNOSIS — J45909 Unspecified asthma, uncomplicated: Secondary | ICD-10-CM

## 2024-07-04 ENCOUNTER — Ambulatory Visit (HOSPITAL_COMMUNITY)
Admission: RE | Admit: 2024-07-04 | Discharge: 2024-07-04 | Disposition: A | Discharge status: Home / Self Care | Source: Ambulatory Visit | Attending: Neurosurgery | Admitting: Neurosurgery

## 2024-07-04 DIAGNOSIS — M47816 Spondylosis without myelopathy or radiculopathy, lumbar region: Secondary | ICD-10-CM | POA: Diagnosis not present

## 2024-07-04 DIAGNOSIS — M5126 Other intervertebral disc displacement, lumbar region: Secondary | ICD-10-CM | POA: Diagnosis not present

## 2024-07-04 DIAGNOSIS — M4316 Spondylolisthesis, lumbar region: Secondary | ICD-10-CM | POA: Diagnosis not present

## 2024-07-04 DIAGNOSIS — M48061 Spinal stenosis, lumbar region without neurogenic claudication: Secondary | ICD-10-CM | POA: Diagnosis not present

## 2024-07-04 DIAGNOSIS — M5416 Radiculopathy, lumbar region: Secondary | ICD-10-CM | POA: Insufficient documentation

## 2024-07-09 DIAGNOSIS — E119 Type 2 diabetes mellitus without complications: Secondary | ICD-10-CM | POA: Diagnosis not present

## 2024-07-16 DIAGNOSIS — M5416 Radiculopathy, lumbar region: Secondary | ICD-10-CM | POA: Diagnosis not present

## 2024-07-16 DIAGNOSIS — Z6830 Body mass index (BMI) 30.0-30.9, adult: Secondary | ICD-10-CM | POA: Diagnosis not present

## 2024-07-25 ENCOUNTER — Other Ambulatory Visit (HOSPITAL_COMMUNITY): Payer: Self-pay | Admitting: Neurosurgery

## 2024-07-25 DIAGNOSIS — M5416 Radiculopathy, lumbar region: Secondary | ICD-10-CM

## 2024-07-28 ENCOUNTER — Other Ambulatory Visit: Payer: Self-pay

## 2024-07-28 DIAGNOSIS — M7918 Myalgia, other site: Secondary | ICD-10-CM

## 2024-07-28 DIAGNOSIS — M5416 Radiculopathy, lumbar region: Secondary | ICD-10-CM | POA: Diagnosis not present

## 2024-08-02 ENCOUNTER — Other Ambulatory Visit: Payer: Self-pay

## 2024-08-04 ENCOUNTER — Other Ambulatory Visit: Payer: Self-pay

## 2024-08-09 DIAGNOSIS — E119 Type 2 diabetes mellitus without complications: Secondary | ICD-10-CM | POA: Diagnosis not present

## 2024-08-12 ENCOUNTER — Other Ambulatory Visit: Payer: Self-pay

## 2024-08-12 DIAGNOSIS — I10 Essential (primary) hypertension: Secondary | ICD-10-CM

## 2024-08-12 DIAGNOSIS — E782 Mixed hyperlipidemia: Secondary | ICD-10-CM

## 2024-08-21 ENCOUNTER — Ambulatory Visit (HOSPITAL_COMMUNITY)
Admission: RE | Admit: 2024-08-21 | Discharge: 2024-08-21 | Disposition: A | Discharge status: Home / Self Care | Source: Ambulatory Visit | Attending: Neurosurgery | Admitting: Neurosurgery

## 2024-08-21 DIAGNOSIS — M5416 Radiculopathy, lumbar region: Secondary | ICD-10-CM | POA: Diagnosis not present

## 2024-08-21 DIAGNOSIS — M4316 Spondylolisthesis, lumbar region: Secondary | ICD-10-CM | POA: Diagnosis not present

## 2024-08-21 DIAGNOSIS — M5117 Intervertebral disc disorders with radiculopathy, lumbosacral region: Secondary | ICD-10-CM | POA: Diagnosis not present

## 2024-08-21 DIAGNOSIS — M5116 Intervertebral disc disorders with radiculopathy, lumbar region: Secondary | ICD-10-CM | POA: Diagnosis not present

## 2024-08-21 DIAGNOSIS — M48061 Spinal stenosis, lumbar region without neurogenic claudication: Secondary | ICD-10-CM | POA: Diagnosis not present

## 2024-09-08 ENCOUNTER — Ambulatory Visit

## 2024-09-08 VITALS — BP 138/78 | HR 72 | Ht 61.0 in | Wt 167.1 lb

## 2024-09-08 DIAGNOSIS — I10 Essential (primary) hypertension: Secondary | ICD-10-CM | POA: Diagnosis not present

## 2024-09-08 DIAGNOSIS — Z6833 Body mass index (BMI) 33.0-33.9, adult: Secondary | ICD-10-CM

## 2024-09-08 DIAGNOSIS — E1122 Type 2 diabetes mellitus with diabetic chronic kidney disease: Secondary | ICD-10-CM | POA: Diagnosis not present

## 2024-09-08 DIAGNOSIS — N1831 Chronic kidney disease, stage 3a: Secondary | ICD-10-CM | POA: Diagnosis not present

## 2024-09-08 DIAGNOSIS — E782 Mixed hyperlipidemia: Secondary | ICD-10-CM

## 2024-09-08 DIAGNOSIS — F339 Major depressive disorder, recurrent, unspecified: Secondary | ICD-10-CM

## 2024-09-08 DIAGNOSIS — J45909 Unspecified asthma, uncomplicated: Secondary | ICD-10-CM

## 2024-09-08 DIAGNOSIS — G2581 Restless legs syndrome: Secondary | ICD-10-CM

## 2024-09-08 MED ORDER — EMPAGLIFLOZIN 25 MG PO TABS: 25.0000 mg | ORAL_TABLET | Freq: Every day | ORAL | 3 refills | Status: AC

## 2024-09-08 MED ORDER — MONTELUKAST SODIUM 10 MG PO TABS: 10.0000 mg | ORAL_TABLET | Freq: Every evening | ORAL | 3 refills | Status: AC

## 2024-09-08 MED ORDER — ESCITALOPRAM OXALATE 20 MG PO TABS: 20.0000 mg | ORAL_TABLET | Freq: Every day | ORAL | 3 refills | Status: AC

## 2024-09-08 MED ORDER — ATORVASTATIN CALCIUM 40 MG PO TABS: 40.0000 mg | ORAL_TABLET | Freq: Every day | ORAL | 3 refills | Status: AC

## 2024-09-08 MED ORDER — PRAMIPEXOLE DIHYDROCHLORIDE 0.25 MG PO TABS: 0.2500 mg | ORAL_TABLET | Freq: Every day | ORAL | 3 refills | Status: AC

## 2024-09-08 NOTE — Assessment & Plan Note (Signed)
 Managed with Mirapex . - Continue Mirapex .

## 2024-09-08 NOTE — Assessment & Plan Note (Signed)
 Continue to follow dietitian plan to help achieve weight loss.

## 2024-09-08 NOTE — Assessment & Plan Note (Signed)
 Managed with atorvastatin  and fenofibrate . Repeat lipid panel ordered today.

## 2024-09-08 NOTE — Assessment & Plan Note (Signed)
 Managed with Lexapro , considering dose reduction due to mood stabilization. - Reduce Lexapro  dose by cutting pills in half. - Monitor mood and adjust dose if necessary.

## 2024-09-08 NOTE — Progress Notes (Signed)
 Established Patient Office Visit  Subjective   Patient ID: Teresa Soto, female    DOB: Feb 26, 1956  Age: 68 y.o. MRN: [id number]  Chief Complaint  Patient presents with   Medical Management of Chronic Issues    6 month follow up    HPI Discussed the use of AI scribe software for clinical note transcription with the patient, who gave verbal consent to proceed.  History of Present Illness    Teresa Soto is a 68 year old female who presents for medication review and management.  Post-stroke neurological deficits - History of stroke in 2014 with residual right-sided weakness and impaired balance - Recent improvement in balance; no longer using a cane  Asthma and respiratory symptoms - Asthma exacerbated by cold air, resulting in wheezing - Dry cough associated with asthma, particularly in cold weather - Uses Singulair  for asthma management  Chronic back pain and degenerative disc disease - History of degenerative disc disease with three prior back surgeries, including spinal fusion - Mostly pain-free, especially in the mornings - Uses gabapentin  for back pain management  Meniere's disease and vestibular symptoms - Meniere's disease with persistent humming sound in ear, described as similar to an immunologist - Uses meclizine  as needed for dizziness  Mood disturbance and night sweats - History of major depression, currently described as minor - Uses Lexapro  for mood stabilization and management of night sweats  Hypertension and diabetes mellitus - Takes lisinopril  for blood pressure control - Takes Jardiance  for diabetes management  Hyperlipidemia - Takes atorvastatin  and fenofibrate  for cholesterol management  Parathyroid  dysfunction and vitamin d  deficiency - History of parathyroid  issues - Takes vitamin D  supplementation  Cutaneous sensitivity - Sensitivity to deodorants with skin reactions; avoids use     Patient Active Problem List    Diagnosis Date Noted   RLS (restless legs syndrome) 03/10/2024   Sensorineural hearing loss, unilateral, right ear, 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 02/13/2024   Other specified disorders of eustachian tube, bilateral 02/13/2024   Dry cough 01/18/2023   S/P lumbar fusion 12/26/2022   Memory changes 12/21/2022   Chronic musculoskeletal pain 11/29/2022   Type 2 diabetes mellitus with stage 3a chronic kidney disease, without long-term current use of insulin  (HCC) 11/09/2021   Hypercalcemia 11/09/2021   Metatarsal stress fracture with routine healing 12/30/2018   Diabetes mellitus without complication (HCC) 11/22/2016   Osteoporosis with current pathological fracture 04/03/2016   Arthritis of knee, right 08/12/2014   Primary osteoarthritis of right knee 07/27/2014   OA (osteoarthritis) of knee 06/16/2014   Primary hyperparathyroidism 06/16/2014   Tinea versicolor 06/16/2014   Body mass index (BMI) 33.0-33.9, adult 04/06/2013   Lack of coordination 10/21/2012   History of cerebrovascular accident 10/10/2012   Vitamin D  deficiency 11/10/2011   Carotid bruit 06/29/2011   Lumbar back pain with radiculopathy affecting left lower extremity 06/28/2010   Depression, recurrent 04/04/2010   Right-sided tinnitus 01/17/2010   Peripheral edema 04/08/2009   Osteoarthritis 03/23/2008   Mixed hyperlipidemia 11/18/2007   Insomnia 10/17/2007   Asthma 11/30/2006   Essential hypertension 09/05/2006   Arthropathy 09/05/2006   Dizziness 09/05/2006   Arthropathy 09/05/2006    ROS    Objective:     BP 138/78 (BP Location: Left Arm, Patient Position: Sitting, Cuff Size: Normal)   Pulse 72   Ht 5' 1 (1.549 m)   Wt 167 lb 1.9 oz (75.8 kg)   SpO2 96%   BMI 31.58 kg/m  BP  Readings from Last 3 Encounters:  09/08/24 138/78  03/10/24 (!) 150/81  02/12/24 (!) 144/72   Wt Readings from Last 3 Encounters:  09/08/24 167 lb 1.9 oz (75.8 kg)  05/13/24 164 lb (74.4 kg)  03/10/24  164 lb 12.8 oz (74.8 kg)      Physical Exam Vitals and nursing note reviewed.  Constitutional:      Appearance: Normal appearance.  HENT:     Head: Normocephalic.  Eyes:     Extraocular Movements: Extraocular movements intact.     Pupils: Pupils are equal, round, and reactive to light.  Cardiovascular:     Rate and Rhythm: Normal rate and regular rhythm.  Pulmonary:     Effort: Pulmonary effort is normal.     Breath sounds: Normal breath sounds.  Musculoskeletal:     Cervical back: Normal range of motion and neck supple.  Neurological:     Mental Status: She is alert and oriented to person, place, and time.  Psychiatric:        Mood and Affect: Mood normal.        Thought Content: Thought content normal.     Diabetic foot exam was performed with the following findings:   No deformities, ulcerations, or other skin breakdown Normal sensation of 10g monofilament Intact posterior tibialis and dorsalis pedis pulses     No results found for any visits on 09/08/24.  Last CBC Lab Results  Component Value Date   WBC 5.8 03/10/2024   HGB 14.0 03/10/2024   HCT 43.8 03/10/2024   MCV 89 03/10/2024   MCH 28.5 03/10/2024   RDW 13.5 03/10/2024   PLT 310 03/10/2024   Last metabolic panel Lab Results  Component Value Date   GLUCOSE 239 (H) 03/10/2024   NA 140 03/10/2024   K 4.2 03/10/2024   CL 103 03/10/2024   CO2 20 03/10/2024   BUN 25 03/10/2024   CREATININE 1.08 (H) 03/10/2024   EGFR 57 (L) 03/10/2024   CALCIUM  9.9 03/10/2024   PHOS 3.6 03/29/2022   PROT 6.7 03/10/2024   ALBUMIN 4.1 03/10/2024   LABGLOB 2.6 03/10/2024   AGRATIO 1.8 11/23/2022   BILITOT 0.2 03/10/2024   ALKPHOS 53 03/10/2024   AST 23 03/10/2024   ALT 23 03/10/2024   ANIONGAP 10 12/22/2022   Last lipids Lab Results  Component Value Date   CHOL 147 03/10/2024   HDL 44 03/10/2024   LDLCALC 81 03/10/2024   TRIG 125 03/10/2024   CHOLHDL 3.3 03/10/2024   Last hemoglobin A1c Lab Results   Component Value Date   HGBA1C 7.7 (H) 03/10/2024   Last thyroid  functions Lab Results  Component Value Date   TSH 0.531 03/10/2024   FREET4 1.19 03/10/2024   Last vitamin D  Lab Results  Component Value Date   VD25OH 59.0 03/10/2024      The ASCVD Risk score (Arnett DK, et al., 2019) failed to calculate for the following reasons:   Risk score cannot be calculated because patient has a medical history suggesting prior/existing ASCVD    Assessment & Plan:   Problem List Items Addressed This Visit       Cardiovascular and Mediastinum   Essential hypertension - Primary   Blood pressure slightly elevated at 138/78 mmHg, managed with lisinopril  20 mg. - Continue with current dose of lisinopril . - Recommend low-sodium diet and regular exercise to improve BP.       Relevant Medications   atorvastatin  (LIPITOR) 40 MG tablet   Other Relevant Orders  CMP14+EGFR     Respiratory   Asthma   Exacerbated by cold air, managed with albuterol  and montelukast . - Continue albuterol  and montelukast .      Relevant Medications   montelukast  (SINGULAIR ) 10 MG tablet     Endocrine   Type 2 diabetes mellitus with stage 3a chronic kidney disease, without long-term current use of insulin  (HCC)   A1c 7.7 on labs from June 2025.  She remains on Jardiance  25 mg daily.  Repeat A1c and urine microalbumin/creatinine ratio today.        Relevant Medications   atorvastatin  (LIPITOR) 40 MG tablet   empagliflozin  (JARDIANCE ) 25 MG TABS tablet   Other Relevant Orders   Hemoglobin A1c   HM Diabetes Foot Exam (Completed)   Urine Microalbumin w/creat. ratio     Other   Mixed hyperlipidemia   Managed with atorvastatin  and fenofibrate . Repeat lipid panel ordered today.      Relevant Medications   atorvastatin  (LIPITOR) 40 MG tablet   Other Relevant Orders   Lipid panel   Body mass index (BMI) 33.0-33.9, adult   Continue to follow dietitian plan to help achieve weight loss.        Relevant Orders   TSH + free T4   Hemoglobin A1c   Lipid panel   CMP14+EGFR   Depression, recurrent   Managed with Lexapro , considering dose reduction due to mood stabilization. - Reduce Lexapro  dose by cutting pills in half. - Monitor mood and adjust dose if necessary.      Relevant Medications   escitalopram  (LEXAPRO ) 20 MG tablet   RLS (restless legs syndrome)   Managed with Mirapex . - Continue Mirapex .      Relevant Medications   pramipexole  (MIRAPEX ) 0.25 MG tablet  Call with results   No follow-ups on file.    Leita Longs, FNP

## 2024-09-08 NOTE — Assessment & Plan Note (Signed)
 Blood pressure slightly elevated at 138/78 mmHg, managed with lisinopril  20 mg. - Continue with current dose of lisinopril . - Recommend low-sodium diet and regular exercise to improve BP.

## 2024-09-08 NOTE — Assessment & Plan Note (Addendum)
 Exacerbated by cold air, managed with albuterol  and montelukast . - Continue albuterol  and montelukast .

## 2024-09-08 NOTE — Assessment & Plan Note (Signed)
 A1c 7.7 on labs from June 2025.  She remains on Jardiance  25 mg daily.  Repeat A1c and urine microalbumin/creatinine ratio today.

## 2024-09-09 LAB — LIPID PANEL

## 2024-09-10 ENCOUNTER — Ambulatory Visit

## 2024-09-10 LAB — CMP14+EGFR
ALT: 18 IU/L (ref 0–32)
AST: 17 IU/L (ref 0–40)
Albumin: 4.4 g/dL (ref 3.9–4.9)
Alkaline Phosphatase: 66 IU/L (ref 49–135)
BUN/Creatinine Ratio: 20 (ref 12–28)
BUN: 16 mg/dL (ref 8–27)
Bilirubin Total: 0.4 mg/dL (ref 0.0–1.2)
CO2: 24 mmol/L (ref 20–29)
Calcium: 9.7 mg/dL (ref 8.7–10.3)
Chloride: 102 mmol/L (ref 96–106)
Creatinine, Ser: 0.82 mg/dL (ref 0.57–1.00)
Globulin, Total: 2.5 g/dL (ref 1.5–4.5)
Glucose: 123 mg/dL — AB (ref 70–99)
Potassium: 4.3 mmol/L (ref 3.5–5.2)
Sodium: 142 mmol/L (ref 134–144)
Total Protein: 6.9 g/dL (ref 6.0–8.5)
eGFR: 78 mL/min/1.73 (ref >59)

## 2024-09-10 LAB — HEMOGLOBIN A1C
Est. average glucose Bld gHb Est-mCnc: 206 mg/dL
Hgb A1c MFr Bld: 8.8 % — ABNORMAL HIGH (ref 4.8–5.6)

## 2024-09-10 LAB — TSH+FREE T4
Free T4: 1.16 ng/dL (ref 0.82–1.77)
TSH: 1.1 u[IU]/mL (ref 0.450–4.500)

## 2024-09-10 LAB — MICROALBUMIN / CREATININE URINE RATIO
Creatinine, Urine: 14.2 mg/dL
Microalb/Creat Ratio: 23 mg/g{creat} (ref 0–29)
Microalbumin, Urine: 3.2 ug/mL

## 2024-09-10 LAB — LIPID PANEL
Cholesterol, Total: 184 mg/dL (ref 100–199)
HDL: 56 mg/dL (ref >39)
LDL CALC COMMENT:: 3.3 ratio (ref 0.0–4.4)
LDL Chol Calc (NIH): 107 mg/dL — AB (ref 0–99)
Triglycerides: 115 mg/dL (ref 0–149)
VLDL Cholesterol Cal: 21 mg/dL (ref 5–40)

## 2024-09-22 ENCOUNTER — Encounter (INDEPENDENT_AMBULATORY_CARE_PROVIDER_SITE_OTHER): Payer: Self-pay | Admitting: Otolaryngology

## 2024-09-22 ENCOUNTER — Ambulatory Visit (INDEPENDENT_AMBULATORY_CARE_PROVIDER_SITE_OTHER): Admitting: Otolaryngology

## 2024-09-22 VITALS — BP 121/75 | HR 77 | Temp 97.7°F | Ht 61.0 in | Wt 165.0 lb

## 2024-09-22 DIAGNOSIS — H9191 Unspecified hearing loss, right ear: Secondary | ICD-10-CM | POA: Diagnosis not present

## 2024-09-22 DIAGNOSIS — H9041 Sensorineural hearing loss, unilateral, right ear, 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: Secondary | ICD-10-CM

## 2024-09-22 DIAGNOSIS — H9311 Tinnitus, right ear: Secondary | ICD-10-CM | POA: Diagnosis not present

## 2024-09-22 DIAGNOSIS — H8101 Meniere's disease, right ear: Secondary | ICD-10-CM | POA: Diagnosis not present

## 2024-09-22 DIAGNOSIS — M26622 Arthralgia of left temporomandibular joint: Secondary | ICD-10-CM | POA: Diagnosis not present

## 2024-09-22 DIAGNOSIS — R42 Dizziness and giddiness: Secondary | ICD-10-CM

## 2024-09-22 MED ORDER — METHOCARBAMOL 500 MG PO TABS: 500.0000 mg | ORAL_TABLET | Freq: Every evening | ORAL | 3 refills | Status: AC | PRN

## 2024-09-22 NOTE — Progress Notes (Unsigned)
 Patient ID: Teresa Soto, female   DOB: 11/09/1955, 68 y.o.   MRN: [id number]  Follow up: Recurrent dizziness, left ear pain, right ear hearing loss, right ear tinnitus  History of Present Illness Teresa Soto is a 68 year old female with right-sided Meniere's disease who presents with worsening vertigo and aural pressure.  Over the past week, she has experienced two episodes of vertigo accompanied by persistent bilateral aural pressure, described as a sensation of both ears being blocked up and progressing from annoyance to pain. The pain is more pronounced on the left. Attempts at Valsalva maneuver exacerbate her pain without relief. She continues to use a nasal corticosteroid spray and tries to follow a low salt diet, but reports no improvement. She uses a right-sided hearing aid for hearing loss and tinnitus. Since her last visit in May, she has noted increased frequency and severity of vertigo and aural pressure. She recalls a prior episode of water in my ears last year.  She also reports left temporomandibular joint arthralgia with clicking and pain, particularly during mastication. She takes Aleve  for arthritis and back pain. She has not used a dental mouth guard and has not seen a dentist in approximately twenty years.  She has no known medication allergies. She states that opioids, including oxycontin  and morphine , have not provided effective pain relief in the past.     Exam: General: Communicates without difficulty, well nourished, no acute distress. Head: Normocephalic, no evidence injury, no tenderness, facial buttresses intact without stepoff. Face/sinus: No tenderness to palpation and percussion. Facial movement is normal and symmetric. Eyes: PERRL, EOMI. No scleral icterus, conjunctivae clear. Neuro: CN II exam reveals vision grossly intact.  No nystagmus at any point of gaze. Ears: Auricles well formed without lesions.  Ear canals are intact without mass or lesion.  No  erythema or edema is appreciated.  The TMs are intact without fluid. Nose: External evaluation reveals normal support and skin without lesions.  Dorsum is intact.  Anterior rhinoscopy reveals congested mucosa over anterior aspect of inferior turbinates and intact septum.  No purulence noted. Oral:  Oral cavity and oropharynx are intact, symmetric, without erythema or edema.  Mucosa is moist without lesions. Neck: Full range of motion without pain.  There is no significant lymphadenopathy.  No masses palpable.  Thyroid  bed within normal limits to palpation.  Parotid glands and submandibular glands equal bilaterally without mass.  Trachea is midline. Neuro:  CN 2-12 grossly intact.    Assessment and Plan Assessment & Plan Right Meniere's disease Chronic right Meniere's disease with recent worsening of vertigo and aural pressure, likely due to increased endolymphatic hydrops. Symptoms are refractory to low sodium diet and intranasal corticosteroids. Examination confirmed absence of middle ear effusion, supporting an inner ear etiology. Surgical intervention, specifically endolymphatic sac decompression, may be considered if medical management fails, as it is more invasive than tympanostomy tube placement and reserved for severe, refractory cases. - Initiated diazide diuretic to reduce endolymphatic pressure. - Provided education on Meniere's disease pathophysiology and rationale for diuretic therapy. - Advised continuation of low sodium diet. - Discussed potential for endolymphatic sac decompression surgery if symptoms remain severe and refractory to medical management. - Scheduled follow-up in six weeks to reassess symptoms and response to therapy.  Left temporomandibular joint arthralgia New onset left TMJ arthralgia with clicking and pain, likely secondary to osteoarthritis given her degenerative joint disease. Examination revealed localized pain and joint clicking. NSAIDs are already in use for other  arthritic  conditions. Muscle tension may contribute to symptoms. - Prescribed methocarbamol  at night as a muscle relaxant. - Discussed dental mouth guard and recommended dental evaluation if symptoms persist. - Advised reassessment in six weeks if no improvement.

## 2024-09-23 DIAGNOSIS — H9202 Otalgia, left ear: Secondary | ICD-10-CM | POA: Insufficient documentation

## 2024-10-18 ENCOUNTER — Encounter (HOSPITAL_COMMUNITY): Payer: Self-pay

## 2024-10-18 ENCOUNTER — Emergency Department (HOSPITAL_COMMUNITY)

## 2024-10-18 ENCOUNTER — Emergency Department (HOSPITAL_COMMUNITY)
Admission: EM | Admit: 2024-10-18 | Discharge: 2024-10-18 | Disposition: A | Discharge status: Home / Self Care | Attending: Emergency Medicine | Admitting: Emergency Medicine

## 2024-10-18 DIAGNOSIS — M542 Cervicalgia: Secondary | ICD-10-CM | POA: Insufficient documentation

## 2024-10-18 DIAGNOSIS — Z7982 Long term (current) use of aspirin: Secondary | ICD-10-CM | POA: Insufficient documentation

## 2024-10-18 DIAGNOSIS — R0602 Shortness of breath: Secondary | ICD-10-CM | POA: Diagnosis not present

## 2024-10-18 DIAGNOSIS — I10 Essential (primary) hypertension: Secondary | ICD-10-CM | POA: Diagnosis not present

## 2024-10-18 DIAGNOSIS — R7989 Other specified abnormal findings of blood chemistry: Secondary | ICD-10-CM | POA: Insufficient documentation

## 2024-10-18 DIAGNOSIS — Z79899 Other long term (current) drug therapy: Secondary | ICD-10-CM | POA: Diagnosis not present

## 2024-10-18 DIAGNOSIS — R0789 Other chest pain: Secondary | ICD-10-CM | POA: Diagnosis present

## 2024-10-18 DIAGNOSIS — E119 Type 2 diabetes mellitus without complications: Secondary | ICD-10-CM | POA: Insufficient documentation

## 2024-10-18 LAB — CBC
HCT: 46.1 % — ABNORMAL HIGH (ref 36.0–46.0)
Hemoglobin: 15.1 g/dL — ABNORMAL HIGH (ref 12.0–15.0)
MCH: 28.9 pg (ref 26.0–34.0)
MCHC: 32.8 g/dL (ref 30.0–36.0)
MCV: 88.3 fL (ref 80.0–100.0)
Platelets: 331 K/uL (ref 150–400)
RBC: 5.22 MIL/uL — ABNORMAL HIGH (ref 3.87–5.11)
RDW: 14.2 % (ref 11.5–15.5)
WBC: 6.9 K/uL (ref 4.0–10.5)
nRBC: 0 % (ref 0.0–0.2)

## 2024-10-18 LAB — BASIC METABOLIC PANEL WITH GFR
Anion gap: 15 (ref 5–15)
BUN: 20 mg/dL (ref 8–23)
CO2: 21 mmol/L — ABNORMAL LOW (ref 22–32)
Calcium: 9.6 mg/dL (ref 8.9–10.3)
Chloride: 101 mmol/L (ref 98–111)
Creatinine, Ser: 1.1 mg/dL — ABNORMAL HIGH (ref 0.44–1.00)
GFR, Estimated: 55 mL/min — ABNORMAL LOW
Glucose, Bld: 176 mg/dL — ABNORMAL HIGH (ref 70–99)
Potassium: 4.1 mmol/L (ref 3.5–5.1)
Sodium: 138 mmol/L (ref 135–145)

## 2024-10-18 LAB — D-DIMER, QUANTITATIVE: D-Dimer, Quant: 0.31 ug{FEU}/mL (ref 0.00–0.50)

## 2024-10-18 LAB — TROPONIN T, HIGH SENSITIVITY
Troponin T High Sensitivity: <15 ng/L (ref 0–19)
Troponin T High Sensitivity: <15 ng/L (ref 0–19)

## 2024-10-18 MED ORDER — ASPIRIN 81 MG PO CHEW
324.0000 mg | CHEWABLE_TABLET | Freq: Once | ORAL | Status: AC
  Administered 2024-10-18: 324 mg via ORAL
  Filled 2024-10-18: qty 4

## 2024-10-18 MED ORDER — NITROGLYCERIN 0.4 MG SL SUBL: 0.4000 mg | SUBLINGUAL_TABLET | SUBLINGUAL | Status: DC | PRN

## 2024-10-18 MED ORDER — IPRATROPIUM-ALBUTEROL 0.5-2.5 (3) MG/3ML IN SOLN
3.0000 mL | Freq: Once | RESPIRATORY_TRACT | Status: AC
  Administered 2024-10-18: 3 mL via RESPIRATORY_TRACT
  Filled 2024-10-18: qty 3

## 2024-10-18 NOTE — ED Triage Notes (Signed)
 Pt c/o squeezing in central chest radiating into neck and SOB starting this afternoon.  Pain score 4/10.  Hx of HTN and hyperlipidemia.    Pt reports family history of MI.

## 2024-10-18 NOTE — Discharge Instructions (Signed)
 Please follow-up closely with your primary care doctor and cardiology on an outpatient basis.  Return to emergency department immediately for any new or worsening symptoms.

## 2024-10-18 NOTE — ED Provider Notes (Signed)
 " [place] [facility] AT [facility] Provider Note   CSN: [id number] Arrival date & time: 10/18/24  1222     Patient presents with: Chest Pain and Shortness of Breath   Teresa Soto is a 69 y.o. female.   Patient is a 69 year old female who presents to the emergency department the chief complaint of chest pressure, neck pain and shortness of breath which began just prior to arrival.  Patient denies any known history of cardiac or pulmonary disease but notes that she does have a family history of CAD.  Patient notes that she has never been evaluated by cardiologist and never had a stress test performed or cardiac cath.  Patient does admit to a history of hypertension, hyperlipidemia and diabetes.  Patient notes that she has had no abdominal pain, nausea, vomiting, diarrhea.  She denies any edema to lower extremities.  She has had no recent cold-like symptoms to include cough, congestion, rhinorrhea, sore throat.  She does note that the pain has improved since onset.   Chest Pain Associated symptoms: shortness of breath   Shortness of Breath Associated symptoms: chest pain        Prior to Admission medications  Medication Sig Start Date End Date Taking? Authorizing Provider  albuterol  (VENTOLIN  HFA) 108 (90 Base) MCG/ACT inhaler INHALE 2 PUFFS BY MOUTH EVERY 6 HOURS AS NEEDED FOR WHEEZING OR  SHORTNESS OF BREATH 07/02/24   Bevely Doffing, FNP  aspirin  EC 81 MG tablet Take 1 tablet (81 mg total) by mouth daily. 01/02/23   Tomlinson, Sara Caylin, PA-C  atorvastatin  (LIPITOR) 40 MG tablet Take 1 tablet (40 mg total) by mouth daily. 09/08/24   Bevely Doffing, FNP  Blood Glucose Monitoring Suppl (BLOOD GLUCOSE SYSTEM PAK) KIT Please dispense based on patient and insurance preference. Use as directed to monitor FSBS 1x daily. Dx: E11.9. 11/28/16   Bari Theodoro FALCON, MD  Cholecalciferol  125 MCG (5000 UT) capsule Take 5,000 Units by mouth daily.    [provider]   empagliflozin  (JARDIANCE ) 25 MG TABS tablet Take 1 tablet (25 mg total) by mouth daily before breakfast. 09/08/24   Bevely Doffing, FNP  escitalopram  (LEXAPRO ) 20 MG tablet Take 1 tablet (20 mg total) by mouth daily. 09/08/24   Bevely Doffing, FNP  fenofibrate  (TRICOR ) 145 MG tablet Take 1 tablet by mouth once daily 08/12/24   Bevely Doffing, FNP  gabapentin  (NEURONTIN ) 300 MG capsule Take 1 capsule by mouth twice daily 07/28/24   Bevely Doffing, FNP  Lancets MISC Please dispense based on patient and insurance preference. Use as directed to monitor FSBS 1x daily. Dx: E11.9. 01/16/23   Melvenia Manus BRAVO, MD  lisinopril  (ZESTRIL ) 20 MG tablet Take 1 tablet (20 mg total) by mouth daily. 03/10/24   Melvenia Manus BRAVO, MD  meclizine  (ANTIVERT ) 25 MG tablet Take 1 tablet (25 mg total) by mouth 3 (three) times daily as needed for dizziness. 03/10/24   Melvenia Manus BRAVO, MD  methocarbamol  (ROBAXIN ) 500 MG tablet Take 1 tablet (500 mg total) by mouth at bedtime as needed (TMJ pain). 09/22/24   Karis Clunes, MD  montelukast  (SINGULAIR ) 10 MG tablet Take 1 tablet (10 mg total) by mouth every evening. 09/08/24   Bevely Doffing, FNP  ondansetron  (ZOFRAN ) 4 MG tablet Take 1 tablet (4 mg total) by mouth every 8 (eight) hours as needed for vomiting or nausea. 03/10/24   Dixon, Phillip E, MD  polyethylene glycol powder (GLYCOLAX /MIRALAX ) 17 GM/SCOOP powder Take 17 g by  mouth every other day. 08/16/14   [provider]  pramipexole  (MIRAPEX ) 0.25 MG tablet Take 1 tablet (0.25 mg total) by mouth daily with breakfast. 09/08/24   Bevely Doffing, FNP    Allergies: Fluticasone , Influenza vaccines, Other, Pneumococcal vaccine, and Pneumococcal vaccines    Review of Systems  Respiratory:  Positive for shortness of breath.   Cardiovascular:  Positive for chest pain.  All other systems reviewed and are negative.   Updated Vital Signs BP (!) 153/91 (BP Location: Right Arm)   Pulse 97   Temp 98.2 F (36.8 C)   Resp 16   Ht 5'  1 (1.549 m)   Wt 74.8 kg   SpO2 96%   BMI 31.18 kg/m   Physical Exam Vitals and nursing note reviewed.  Constitutional:      General: She is not in acute distress.    Appearance: Normal appearance. She is not ill-appearing.  HENT:     Head: Normocephalic and atraumatic.     Nose: Nose normal.     Mouth/Throat:     Mouth: Mucous membranes are moist.  Eyes:     Extraocular Movements: Extraocular movements intact.     Conjunctiva/sclera: Conjunctivae normal.     Pupils: Pupils are equal, round, and reactive to light.  Cardiovascular:     Rate and Rhythm: Normal rate and regular rhythm.     Pulses: Normal pulses.     Heart sounds: Normal heart sounds. Heart sounds not distant. No murmur heard. Pulmonary:     Effort: Pulmonary effort is normal. No tachypnea or respiratory distress.     Breath sounds: Normal breath sounds. No stridor. No decreased breath sounds, wheezing, rhonchi or rales.  Chest:     Chest wall: No tenderness.  Abdominal:     General: Abdomen is flat. Bowel sounds are normal.     Palpations: Abdomen is soft.     Tenderness: There is no abdominal tenderness. There is no guarding.  Musculoskeletal:        General: Normal range of motion.     Cervical back: Normal range of motion and neck supple.     Right lower leg: No edema.     Left lower leg: No edema.  Skin:    General: Skin is warm and dry.  Neurological:     General: No focal deficit present.     Mental Status: She is alert and oriented to person, place, and time. Mental status is at baseline.  Psychiatric:        Mood and Affect: Mood normal.        Behavior: Behavior normal.        Thought Content: Thought content normal.        Judgment: Judgment normal.     (all labs ordered are listed, but only abnormal results are displayed) Labs Reviewed  CBC - Abnormal; Notable for the following components:      Result Value   RBC 5.22 (*)    Hemoglobin 15.1 (*)    HCT 46.1 (*)    All other components  within normal limits  BASIC METABOLIC PANEL WITH GFR  D-DIMER, QUANTITATIVE  TROPONIN T, HIGH SENSITIVITY    EKG: None  Radiology: No results found.   Procedures   Medications Ordered in the ED  nitroGLYCERIN  (NITROSTAT ) SL tablet 0.4 mg (has no administration in time range)  aspirin  chewable tablet 324 mg (324 mg Oral Given 10/18/24 1318)  Medical Decision Making Amount and/or Complexity of Data Reviewed Labs: ordered. Radiology: ordered.  Risk OTC drugs. Prescription drug management.   This patient presents to the ED for concern of chest pain differential diagnosis includes ACS, pulmonary embolus, pericarditis, myocarditis, endocarditis, aortic aneurysm or dissection, pneumonia, pneumothorax, hemothorax, costochondritis, chest wall pain, pleurisy    Additional history obtained:  Additional history obtained from medical records External records from outside source obtained and reviewed including medical records   Lab Tests:  I Ordered, and personally interpreted labs.  The pertinent results include: No leukocytosis, no anemia, unremarkable electrolytes, mild elevation of creatinine, negative D-dimer, negative serial troponins   Imaging Studies ordered: Chest I ordered imaging studies including chest x-ray I independently visualized and interpreted imaging which showed no acute cardiopulmonary process I agree with the radiologist interpretation   Medicines ordered and prescription drug management:  I ordered medication including DuoNeb, aspirin  for chest pain, wheezing Reevaluation of the patient after these medicines showed that the patient resolved I have reviewed the patients home medicines and have made adjustments as needed   Problem List / ED Course:  Patient is doing well at this time and is stable for discharge home.  Discussed with patient that all workup in the emergency department has been unremarkable.  Low  suspicion for ACS at this time.  EKG had no acute ischemic changes and patient has negative serial troponins.  Chest x-ray demonstrated no indication for pneumonia, pneumothorax, hemothorax.  Low suspicion for pulmonary embolus with negative D-dimer and is otherwise low risk individual.  Do not suspect aortic aneurysm or dissection.  Symptoms are nonpositional in nature and do not suspect pericarditis or myocarditis.  Do not suspect endocarditis in this patient.  Ambulatory referral to cardiology has been placed.  Discussed the need for follow-up with PCP and cardiology.  Strict turn precautions were discussed for any new or worsening symptoms.  Patient voiced understand to the plan and had no additional questions.   Social Determinants of Health:  None        Final diagnoses:  None    ED Discharge Orders     None          Daralene Lonni JONETTA DEVONNA 10/18/24 1541    Dean Clarity, MD 10/18/24 1554  "

## 2024-10-30 ENCOUNTER — Other Ambulatory Visit: Payer: Self-pay

## 2024-10-30 DIAGNOSIS — M7918 Myalgia, other site: Secondary | ICD-10-CM

## 2024-11-03 ENCOUNTER — Ambulatory Visit (INDEPENDENT_AMBULATORY_CARE_PROVIDER_SITE_OTHER): Admitting: Otolaryngology

## 2024-11-12 ENCOUNTER — Encounter: Payer: Self-pay | Admitting: Cardiology

## 2024-11-12 ENCOUNTER — Encounter: Payer: Self-pay | Admitting: *Deleted

## 2024-11-12 ENCOUNTER — Ambulatory Visit: Admitting: Cardiology

## 2024-11-12 VITALS — BP 120/72 | HR 88 | Ht 61.0 in | Wt 165.4 lb

## 2024-11-12 DIAGNOSIS — R079 Chest pain, unspecified: Secondary | ICD-10-CM

## 2024-11-12 DIAGNOSIS — Z01812 Encounter for preprocedural laboratory examination: Secondary | ICD-10-CM

## 2024-11-12 DIAGNOSIS — I10 Essential (primary) hypertension: Secondary | ICD-10-CM

## 2024-11-12 MED ORDER — METOPROLOL TARTRATE 100 MG PO TABS: 100.0000 mg | ORAL_TABLET | Freq: Once | ORAL | 0 refills | Status: AC

## 2024-11-12 NOTE — Patient Instructions (Addendum)
 Medication Instructions:   Continue all current medications.   Labwork:  BMET - order given today  Please do 2-3 days prior to test   Testing/Procedures:  Coronary CTA   Follow-Up:  Office will contact with results via phone, letter or mychart.    pending test results   Any Other Special Instructions Will Be Listed Below (If Applicable).   If you need a refill on your cardiac medications before your next appointment, please call your pharmacy.

## 2024-11-12 NOTE — Progress Notes (Signed)
 "     Clinical Summary Teresa Soto is a 69 y.o.female seen today as a new patient for the following medical problems  1.Chest pain - ER visit Jan 20026 with chest pain and SOB - trops neg x 2. D dimer neg - EKG SR, RAD, no acute ischemic changes - CXR no acute process  - CAD risk factors: HLD, DM, former smoker remotely few years, HTN. Younger sister with 2 prior MIs, prior CVA   - episodes started at rest. Squeezing like pain midchest, 7-8/10 in severity. +SOB. Not positional.  - waited 20-30 min and then went to ER - pain lasted roughly an hour. Self resolved - had prior episode that was more severe, 30 minutes.  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 30 tablet 12   atorvastatin  (LIPITOR) 40 MG tablet Take 1 tablet (40 mg total) by mouth daily. 90 tablet 3   Blood Glucose Monitoring Suppl (BLOOD GLUCOSE SYSTEM PAK) KIT Please dispense based  on patient and insurance preference. Use as directed to monitor FSBS 1x daily. Dx: E11.9. 1 each 1   Cholecalciferol  125 MCG (5000 UT) capsule Take 5,000 Units by mouth daily.     empagliflozin  (JARDIANCE ) 25 MG TABS tablet Take 1 tablet (25 mg total) by mouth daily before breakfast. 90 tablet 3   escitalopram  (LEXAPRO ) 20 MG tablet Take 1 tablet (20 mg total) by mouth daily. 90 tablet 3   fenofibrate  (TRICOR ) 145 MG tablet Take 1 tablet by mouth once daily 90 tablet 0   gabapentin  (NEURONTIN ) 300 MG capsule Take 1 capsule by mouth twice daily 180 capsule 1   Lancets MISC Please dispense based on patient and insurance preference. Use as directed to monitor FSBS 1x daily. Dx: E11.9. 100 each 1   lisinopril  (ZESTRIL ) 20 MG tablet Take 1 tablet (20 mg total) by mouth daily. 90 tablet 3   meclizine  (ANTIVERT ) 25 MG tablet Take 1 tablet (25 mg total) by mouth 3 (three) times daily as needed for dizziness. 30 tablet 2   methocarbamol  (ROBAXIN ) 500 MG tablet Take 1 tablet (500 mg total) by mouth at bedtime as needed (TMJ pain). 30 tablet 3   montelukast  (SINGULAIR ) 10  MG tablet Take 1 tablet (10 mg total) by mouth every evening. 90 tablet 3   ondansetron  (ZOFRAN ) 4 MG tablet Take 1 tablet (4 mg total) by mouth every 8 (eight) hours as needed for vomiting or nausea. 30 tablet 2   polyethylene glycol powder (GLYCOLAX /MIRALAX ) 17 GM/SCOOP powder Take 17 g by mouth every other day.     pramipexole  (MIRAPEX ) 0.25 MG tablet Take 1 tablet (0.25 mg total) by mouth daily with breakfast. 90 tablet 3   No current facility-administered medications for this visit.     Past Surgical History:  Procedure Laterality Date   ANTERIOR LAT LUMBAR FUSION Right 12/26/2022   Procedure: Direct Lateral Interbody Fusion Lumbar three-four, Right  Prone  Transpsoas;  Surgeon: Debby Dorn MATSU, MD;  Location: [facility] OR;  Service: Neurosurgery;  Laterality: Right;   BACK SURGERY  2012   L1-L2  x2   CESAREAN SECTION     x 2    JOINT REPLACEMENT     LUMBAR PERCUTANEOUS PEDICLE SCREW 1 LEVEL Right 12/26/2022   Procedure: LUMBAR PERCUTANEOUS PEDICLE SCREW LUMBAR THREE-FOUR;  Surgeon: Debby Dorn MATSU, MD;  Location: [facility] OR;  Service: Neurosurgery;  Laterality: Right;   PARATHYROIDECTOMY N/A 06/27/2016   Procedure: MINIMALLY INVASIVE PARATHYROIDECTOMY;  Surgeon: Camellia Blush, MD;  Location: [facility] ORS;  Service: General;  Laterality: N/A;   PARATHYROIDECTOMY Right 02/09/2022   Procedure: PARATHYROIDECTOMY;  Surgeon: Kinsinger, Herlene Righter, MD;  Location: [facility] ORS;  Service: General;  Laterality: Right;   SHOULDER ARTHROSCOPY W/ ROTATOR CUFF REPAIR  2010   to right shoulder   SPINE SURGERY  12/2010   TONSILLECTOMY     TOTAL KNEE ARTHROPLASTY Right 08/12/2014   Procedure: RIGHT TOTAL KNEE ARTHROPLASTY;  Surgeon: Taft FORBES Minerva, MD;  Location: [facility] ORS;  Service: Orthopedics;  Laterality: Right;   TUBAL LIGATION  1999     Allergies[2]    Family History  Problem Relation Age of Onset   Hypertension Mother    Hyperlipidemia Mother    Alcohol abuse Mother    Arthritis Mother    Alcohol abuse Father    Dementia Paternal Grandmother    Alcohol abuse Maternal Grandfather    Hearing loss Maternal Grandfather    Heart disease Maternal Grandfather    Hypertension Maternal Grandfather    Depression Maternal Grandmother    ADD / ADHD Son    Learning disabilities Son    Alcohol abuse Sister    Arthritis Sister    Alcohol abuse Paternal Uncle    Diabetes Maternal Aunt    Obesity Maternal Aunt      Social History Teresa Soto reports that she quit smoking about 42 years ago. Her smoking use included cigarettes. She started smoking about 48 years ago. She has a 12 pack-year smoking history. She has been exposed to tobacco smoke. She has never used smokeless tobacco. Teresa Soto reports current alcohol use of about 2.0 standard drinks of alcohol per week.    Physical Examination Today's Vitals   11/12/24 1546  BP:  120/72  Pulse: 88  SpO2: 98%  Weight: 165 lb 6.4 oz (75 kg)  Height: 5' 1 (1.549 m)   Body mass index is 31.25 kg/m.  Gen: resting comfortably, no acute distress HEENT: no scleral icterus, pupils equal round and reactive, no palptable cervical adenopathy,  CV: RRR, no m/rg, no jvd Resp: Clear to auscultation bilaterally GI: abdomen is soft, non-tender, non-distended, normal bowel sounds, no hepatosplenomegaly MSK: extremities are warm, no edema.  Skin:  warm, no rash Neuro:  no focal deficits Psych: appropriate affect     Assessment and Plan  1.Chest pain - unclear etiology, symptoms are mixed as far as possible being ischemia - multiple CAD risk factors including DM2, younger sister with MIs x 2. - plan for coronary CTA to further evaluate   F/u pending CT findings      Dorn PHEBE Ross, M.D.     [1]  Allergies Allergen Reactions   Fluticasone      headache   Influenza Vaccines Rash   Other Nausea And Vomiting    Peppers    Pneumococcal Vaccine Rash    Had rash, given both flu and PNA at same time    Pneumococcal Vaccines Rash    Had rash, given both flu and PNA at same time   [2]  Allergies Allergen Reactions   Fluticasone      headache   Influenza Vaccines Rash   Other Nausea And Vomiting    Peppers    Pneumococcal Vaccine Rash    Had rash, given both flu and PNA at same time    Pneumococcal Vaccines Rash    Had rash, given both flu and PNA at same time    "

## 2024-11-26 ENCOUNTER — Ambulatory Visit (INDEPENDENT_AMBULATORY_CARE_PROVIDER_SITE_OTHER): Admitting: Otolaryngology

## 2024-12-10 ENCOUNTER — Ambulatory Visit: Admitting: Cardiology

## 2025-01-07 ENCOUNTER — Ambulatory Visit

## 2025-05-18 ENCOUNTER — Ambulatory Visit

## 2025-05-20 ENCOUNTER — Ambulatory Visit
